# Patient Record
Sex: Male | Born: 1955 | Race: White | Hispanic: No | Marital: Single | Smoking: Former smoker
Health system: Southern US, Community
[De-identification: ages and names within clinical notes are randomized; demographics above are authoritative.]

## PROBLEM LIST (undated history)

## (undated) DIAGNOSIS — J45909 Unspecified asthma, uncomplicated: Secondary | ICD-10-CM

## (undated) DIAGNOSIS — I4891 Unspecified atrial fibrillation: Secondary | ICD-10-CM

## (undated) DIAGNOSIS — K219 Gastro-esophageal reflux disease without esophagitis: Secondary | ICD-10-CM

## (undated) DIAGNOSIS — M109 Gout, unspecified: Secondary | ICD-10-CM

## (undated) DIAGNOSIS — I2510 Atherosclerotic heart disease of native coronary artery without angina pectoris: Secondary | ICD-10-CM

## (undated) DIAGNOSIS — Z9289 Personal history of other medical treatment: Secondary | ICD-10-CM

## (undated) DIAGNOSIS — I5032 Chronic diastolic (congestive) heart failure: Secondary | ICD-10-CM

## (undated) DIAGNOSIS — N182 Chronic kidney disease, stage 2 (mild): Secondary | ICD-10-CM

## (undated) DIAGNOSIS — M1990 Unspecified osteoarthritis, unspecified site: Secondary | ICD-10-CM

## (undated) HISTORY — DX: Chronic diastolic (congestive) heart failure: I50.32

## (undated) HISTORY — DX: Unspecified osteoarthritis, unspecified site: M19.90

## (undated) HISTORY — DX: Chronic kidney disease, stage 2 (mild): N18.2

## (undated) HISTORY — DX: Unspecified asthma, uncomplicated: J45.909

## (undated) HISTORY — PX: VASECTOMY: SHX75

## (undated) HISTORY — DX: Personal history of other medical treatment: Z92.89

## (undated) HISTORY — DX: Atherosclerotic heart disease of native coronary artery without angina pectoris: I25.10

## (undated) HISTORY — DX: Unspecified atrial fibrillation: I48.91

## (undated) HISTORY — DX: Gastro-esophageal reflux disease without esophagitis: K21.9

## (undated) HISTORY — DX: Gout, unspecified: M10.9

---

## 2000-08-13 HISTORY — PX: CORONARY ARTERY BYPASS GRAFT: SHX141

## 2009-01-12 ENCOUNTER — Encounter (INDEPENDENT_AMBULATORY_CARE_PROVIDER_SITE_OTHER): Payer: Self-pay | Admitting: *Deleted

## 2009-01-12 LAB — CONVERTED CEMR LAB
ALT: 20 units/L
AST: 25 units/L
Albumin: 4.2 g/dL
Alkaline Phosphatase: 50 units/L
CO2: 23 meq/L
Chloride: 103 meq/L
HDL: 36 mg/dL
LDL Cholesterol: 92 mg/dL
Potassium: 4.9 meq/L
Sodium: 139 meq/L

## 2009-10-24 ENCOUNTER — Encounter (INDEPENDENT_AMBULATORY_CARE_PROVIDER_SITE_OTHER): Payer: Self-pay | Admitting: *Deleted

## 2009-10-24 DIAGNOSIS — Z8679 Personal history of other diseases of the circulatory system: Secondary | ICD-10-CM | POA: Insufficient documentation

## 2009-10-24 DIAGNOSIS — M1990 Unspecified osteoarthritis, unspecified site: Secondary | ICD-10-CM | POA: Insufficient documentation

## 2009-10-24 DIAGNOSIS — E785 Hyperlipidemia, unspecified: Secondary | ICD-10-CM | POA: Insufficient documentation

## 2009-10-26 ENCOUNTER — Ambulatory Visit: Payer: Self-pay | Admitting: Cardiology

## 2009-10-26 DIAGNOSIS — I25810 Atherosclerosis of coronary artery bypass graft(s) without angina pectoris: Secondary | ICD-10-CM | POA: Insufficient documentation

## 2009-10-27 ENCOUNTER — Telehealth (INDEPENDENT_AMBULATORY_CARE_PROVIDER_SITE_OTHER): Payer: Self-pay

## 2009-10-27 ENCOUNTER — Encounter: Payer: Self-pay | Admitting: Cardiology

## 2009-11-02 ENCOUNTER — Telehealth (INDEPENDENT_AMBULATORY_CARE_PROVIDER_SITE_OTHER): Payer: Self-pay

## 2009-11-04 ENCOUNTER — Encounter: Payer: Self-pay | Admitting: Cardiology

## 2010-06-12 ENCOUNTER — Encounter (INDEPENDENT_AMBULATORY_CARE_PROVIDER_SITE_OTHER): Payer: Self-pay

## 2010-06-12 LAB — CONVERTED CEMR LAB
Cholesterol: 133 mg/dL
Triglycerides: 98 mg/dL

## 2010-06-21 ENCOUNTER — Encounter (INDEPENDENT_AMBULATORY_CARE_PROVIDER_SITE_OTHER): Payer: Self-pay

## 2010-08-13 HISTORY — PX: CORONARY ARTERY BYPASS GRAFT: SHX141

## 2010-08-22 ENCOUNTER — Telehealth (INDEPENDENT_AMBULATORY_CARE_PROVIDER_SITE_OTHER): Payer: Self-pay | Admitting: Radiology

## 2010-08-23 ENCOUNTER — Encounter: Payer: Self-pay | Admitting: *Deleted

## 2010-08-23 ENCOUNTER — Ambulatory Visit: Admission: RE | Admit: 2010-08-23 | Discharge: 2010-08-23 | Payer: Self-pay | Source: Home / Self Care

## 2010-08-23 ENCOUNTER — Encounter: Payer: Self-pay | Admitting: Cardiology

## 2010-08-23 ENCOUNTER — Encounter (HOSPITAL_COMMUNITY)
Admission: RE | Admit: 2010-08-23 | Discharge: 2010-09-12 | Payer: Self-pay | Source: Home / Self Care | Attending: Cardiology | Admitting: Cardiology

## 2010-08-23 DIAGNOSIS — R072 Precordial pain: Secondary | ICD-10-CM | POA: Insufficient documentation

## 2010-08-24 ENCOUNTER — Encounter: Payer: Self-pay | Admitting: Cardiology

## 2010-08-24 ENCOUNTER — Ambulatory Visit (HOSPITAL_COMMUNITY)
Admission: RE | Admit: 2010-08-24 | Discharge: 2010-08-25 | Payer: Self-pay | Source: Home / Self Care | Attending: Cardiology | Admitting: Cardiology

## 2010-08-25 ENCOUNTER — Encounter: Payer: Self-pay | Admitting: Thoracic Surgery (Cardiothoracic Vascular Surgery)

## 2010-08-25 ENCOUNTER — Encounter: Payer: Self-pay | Admitting: Cardiology

## 2010-08-28 ENCOUNTER — Encounter: Payer: Self-pay | Admitting: Cardiology

## 2010-08-28 LAB — CONVERTED CEMR LAB
BUN: 17 mg/dL (ref 6–23)
Basophils Absolute: 0 10*3/uL (ref 0.0–0.1)
Calcium: 9.7 mg/dL (ref 8.4–10.5)
Eosinophils Absolute: 0.1 10*3/uL (ref 0.0–0.7)
INR: 0.99 (ref <1.50)
Lymphocytes Relative: 27 % (ref 12–46)
Lymphs Abs: 1.8 10*3/uL (ref 0.7–4.0)
Neutrophils Relative %: 62 % (ref 43–77)
Platelets: 296 10*3/uL (ref 150–400)
Potassium: 4.3 meq/L (ref 3.5–5.3)
Prothrombin Time: 13.3 s (ref 11.6–15.2)
WBC: 6.5 10*3/uL (ref 4.0–10.5)

## 2010-08-28 LAB — BASIC METABOLIC PANEL
BUN: 18 mg/dL (ref 6–23)
CO2: 26 mEq/L (ref 19–32)
Calcium: 8.8 mg/dL (ref 8.4–10.5)
Chloride: 108 mEq/L (ref 96–112)
Creatinine, Ser: 1.21 mg/dL (ref 0.4–1.5)
GFR calc Af Amer: >60 mL/min (ref >60)
GFR calc non Af Amer: >60 mL/min (ref >60)
Glucose, Bld: 110 mg/dL — ABNORMAL HIGH (ref 70–99)
Potassium: 3.9 mEq/L (ref 3.5–5.1)
Sodium: 141 mEq/L (ref 135–145)

## 2010-08-28 LAB — CBC
HCT: 37.4 % — ABNORMAL LOW (ref 39.0–52.0)
Hemoglobin: 13.3 g/dL (ref 13.0–17.0)
MCH: 31.6 pg (ref 26.0–34.0)
MCHC: 35.6 g/dL (ref 30.0–36.0)
MCV: 88.8 fL (ref 78.0–100.0)
Platelets: 217 10*3/uL (ref 150–400)
RBC: 4.21 MIL/uL — ABNORMAL LOW (ref 4.22–5.81)
RDW: 12.2 % (ref 11.5–15.5)
WBC: 7 10*3/uL (ref 4.0–10.5)

## 2010-08-28 LAB — CARDIAC PANEL(CRET KIN+CKTOT+MB+TROPI)
CK, MB: 1.6 ng/mL (ref 0.3–4.0)
Relative Index: INVALID (ref 0.0–2.5)
Total CK: 69 U/L (ref 7–232)
Troponin I: 0.01 ng/mL (ref 0.00–0.06)

## 2010-08-30 ENCOUNTER — Inpatient Hospital Stay (HOSPITAL_COMMUNITY)
Admission: RE | Admit: 2010-08-30 | Discharge: 2010-09-04 | Payer: Self-pay | Source: Home / Self Care | Attending: Thoracic Surgery (Cardiothoracic Vascular Surgery) | Admitting: Thoracic Surgery (Cardiothoracic Vascular Surgery)

## 2010-08-30 LAB — CBC
HCT: 38.8 % — ABNORMAL LOW (ref 39.0–52.0)
Hemoglobin: 14.3 g/dL (ref 13.0–17.0)
MCH: 31.4 pg (ref 26.0–34.0)
MCHC: 36.9 g/dL — ABNORMAL HIGH (ref 30.0–36.0)
MCV: 85.1 fL (ref 78.0–100.0)
Platelets: 264 10*3/uL (ref 150–400)
RBC: 4.56 MIL/uL (ref 4.22–5.81)
RDW: 12.1 % (ref 11.5–15.5)
WBC: 5.6 10*3/uL (ref 4.0–10.5)

## 2010-08-30 LAB — HEMOGLOBIN A1C
Hgb A1c MFr Bld: 5.5 % (ref <5.7)
Mean Plasma Glucose: 111 mg/dL (ref <117)

## 2010-08-30 LAB — URINALYSIS, ROUTINE W REFLEX MICROSCOPIC
Bilirubin Urine: NEGATIVE
Hgb urine dipstick: NEGATIVE
Ketones, ur: NEGATIVE mg/dL
Nitrite: NEGATIVE
Protein, ur: NEGATIVE mg/dL
Specific Gravity, Urine: 1.011 (ref 1.005–1.030)
Urine Glucose, Fasting: NEGATIVE mg/dL
Urobilinogen, UA: 0.2 mg/dL (ref 0.0–1.0)
pH: 7 (ref 5.0–8.0)

## 2010-08-30 LAB — COMPREHENSIVE METABOLIC PANEL
ALT: 19 U/L (ref 0–53)
AST: 19 U/L (ref 0–37)
Albumin: 4.5 g/dL (ref 3.5–5.2)
Alkaline Phosphatase: 44 U/L (ref 39–117)
BUN: 17 mg/dL (ref 6–23)
CO2: 22 mEq/L (ref 19–32)
Calcium: 9.7 mg/dL (ref 8.4–10.5)
Chloride: 106 mEq/L (ref 96–112)
Creatinine, Ser: 1.16 mg/dL (ref 0.4–1.5)
GFR calc Af Amer: >60 mL/min (ref >60)
GFR calc non Af Amer: >60 mL/min (ref >60)
Glucose, Bld: 91 mg/dL (ref 70–99)
Potassium: 4.4 mEq/L (ref 3.5–5.1)
Sodium: 137 mEq/L (ref 135–145)
Total Bilirubin: 0.7 mg/dL (ref 0.3–1.2)
Total Protein: 6.6 g/dL (ref 6.0–8.3)

## 2010-08-30 LAB — ABO/RH: ABO/RH(D): AB POS

## 2010-08-30 LAB — BLOOD GAS, ARTERIAL
Acid-Base Excess: 0.8 mmol/L (ref 0.0–2.0)
Bicarbonate: 24.2 mEq/L — ABNORMAL HIGH (ref 20.0–24.0)
Drawn by: 181601
FIO2: 0.21 %
O2 Saturation: 97.8 %
Patient temperature: 98.6
TCO2: 25.3 mmol/L (ref 0–100)
pCO2 arterial: 34.5 mmHg — ABNORMAL LOW (ref 35.0–45.0)
pH, Arterial: 7.46 — ABNORMAL HIGH (ref 7.350–7.450)
pO2, Arterial: 101 mmHg — ABNORMAL HIGH (ref 80.0–100.0)

## 2010-08-30 LAB — APTT: aPTT: 31 seconds (ref 24–37)

## 2010-08-30 LAB — SURGICAL PCR SCREEN
MRSA, PCR: NEGATIVE
Staphylococcus aureus: POSITIVE — AB

## 2010-08-30 LAB — PROTIME-INR
INR: 1.04 (ref 0.00–1.49)
Prothrombin Time: 13.8 seconds (ref 11.6–15.2)

## 2010-09-01 NOTE — Discharge Summary (Addendum)
[name], [name]:  [id number]  MEDICAL RECORD NO.:  [id number]           PATIENT TYPE:  LOCATION:                                 FACILITY:  PHYSICIAN:  Jeremy Hendricks C. Sundee Garland, MD, FACCDATE OF BIRTH:  08-17-1955  DATE OF ADMISSION:  08/24/2010 DATE OF DISCHARGE:                              DISCHARGE SUMMARY   DATE OF ADMISSION:  August 24, 2010.  DATE OF DISCHARGE:  August 25, 2010.  PRIMARY CARDIOLOGIST:  Jeremy Sans. Emberlyn Burlison, MD, [facility].  PRIMARY CARE PHYSICIAN:  Jeremy Hendricks. Jeremy Sills, MD.  CARDIOTHORACIC SURGEON:  Jeremy Hendricks. [name], M.D.  DISCHARGE DIAGNOSES: 1. Coronary artery disease.     a.     Cardiac catheterization on August 24, 2010:  Left main has      diffuse 25% stenosis.  LAD proximal 90% stenosis; mid long, 60%      stenosis, distal vessel filled via LIMA and was free of high-grade      disease.  Ramus intermediate was large and long and previously      bypassed.  There was a long 60% stenosis, somewhat narrow-caliber      vessel.  Circumflex in the AV groove had 50% stenosis at the OM-1,      long mid 80% to 90% stenosis, subtotal stenosis before the PDA and      posterolateral, dominant vessel.  PDA was large and free of high-      grade disease.  Posterior lateral was moderate size and 30%      stenosis.  The first obtuse marginal was moderate sized with      ostial 80% stenosis.  The second obtuse marginal was small      branching and normal.  The right coronary was nondominant and      small.  LIMA to LAD, widely patent.  SVG to ramus intermediate was occluded proximally.  SVG to the PDA had severe diffuse disease  (ostial 50% stenosis, proximal 70% stenosis, distal 90%).  Aortic root was obtained to visualize any further grafts as we had no description of the surgery which demonstrated grafts as described, otherwise, free of significant disease.  LVEF 60% with mild inferior hypokinesis.  Recommended for redo bypass by cathing  cardiologist, Jeremy Hendricks.  Cardiothoracic surgery consult via Dr. Charlett Hendricks with recommendation for redo CABG, scheduled on August 30, 2010.  SECONDARY DIAGNOSES: 1. Coronary artery disease.     a.     CABG x3, 2000 (no records available). 2. Hyperlipidemia. 3. Degenerative joint disease.  ALLERGIES:  NKDA.  PROCEDURES: 1. Cardiac catheterization, August 24, 2010:  Please see discharge     diagnoses section, #1, subsection a. 2. EKG, August 25, 2010:  Sinus bradycardia, 57 bpm, T-wave inversion     in V1, V2; otherwise, nonspecific ST-T wave changes with no     significant Q-waves, left axis deviation, no evidence of     hypertrophy, with a slightly delayed R-wave progression.  PR 174,     QRS 94, and QTc of 434.  No significant change from prior tracing     completed  at [facility] office on August 23, 2010, except     for axis change from normal to left?  HISTORY OF PRESENT ILLNESS:  Mr. Kniss is a 55 year old Caucasian gentleman who has known history of coronary artery disease, having undergone CABG 12 years ago in [place], who was sent from his primary care office to be evaluated at [facility] for complaints consistent with angina.  He subsequently had a stress perfusion study with good exercise tolerance, but significant EKG changes and evidence of inferior ischemia.  He was subsequently scheduled for diagnostic cardiac catheterization on August 24, 2010, and presents for that procedure.  HOSPITAL COURSE:  The patient was admitted and underwent procedures as described above.  He tolerated them well without any significant complications.  Unfortunately, as described in discharge diagnoses, section #1 subsection a, the patient did have significant new disease, specifically, two of the three grafts were either totally occluded or significantly occluded, and it was the determination of the cathing physician, Jeremy Hendricks, the patient  would be best suited for surgery consult with question of redo CABG.  Cardiothoracic surgery was able to see the patient in the early afternoon of August 25, 2010.  Dr. Charlett Hendricks reviewed all the data and determined that the patient would benefit from redo CABG, and all the risks and benefits were discussed with the patient who has decided to proceed with scheduled redo CABG this coming Wednesday, August 30, 2010.  The patient is being discharged home in the interim with instructions to continue his preadmission medications and to not exert himself above the level of daily activities.  At the time of discharge, patient received his old medication list, followup instructions, and all questions and concerns had been addressed prior to his leaving the hospital.  DISCHARGE LABS:  WBC is 7.0, HGB 13.3, HCT 37.4, PLT count is 217. Sodium 141, potassium 3.9, chloride 108, bicarb 26, BUN 18, creatinine 1.21, and glucose 110.  Cardiac enzymes negative times one set.  FOLLOWUP PLANS:  Appointment with Cardiothoracic Surgery, redo CABG, August 30, 2010, early a.m. at [facility] (patient to be contacted with further details).  DISCHARGE MEDICATIONS: 1. Aspirin 325 mg 1/2 tablet p.o. daily. 2. Atenolol 25 mg 1 tablet p.o. daily. 3. Crestor 20 mg p.o. daily. 4. Multivitamin daily. 5. Sublingual nitroglycerin 0.4 mg q.5 minutes up to three doses     p.r.n. for chest discomfort.  DURATION OF DISCHARGE ENCOUNTER INCLUDING PHYSICIAN TIME:  35 minutes.     Jeremy Hendricks, PAC   ______________________________ Jeremy Sans. Daleen Squibb, MD, [facility]    MS/MEDQ  D:  08/25/2010  T:  08/26/2010  Job:  [id number]  cc:   Jeremy Hendricks. [name], M.D. Jeremy Qazi C. Daleen Squibb, MD, [facility] Jeremy Hendricks. Jeremy Sills, MD  Electronically Signed by Jeremy Hendricks PAC on 09/01/2010 01:50:13 PM Electronically Signed by Jeremy Castle MD [facility] on 09/01/2010 04:16:37 PM

## 2010-09-04 LAB — PREPARE PLATELETS: Unit division: 0

## 2010-09-04 LAB — GLUCOSE, CAPILLARY
Glucose-Capillary: 82 mg/dL (ref 70–99)
Glucose-Capillary: 188 mg/dL — ABNORMAL HIGH (ref 70–99)
Glucose-Capillary: 129 mg/dL — ABNORMAL HIGH (ref 70–99)

## 2010-09-04 LAB — CBC
HCT: 35 % — ABNORMAL LOW (ref 39.0–52.0)
HCT: 26.1 % — ABNORMAL LOW (ref 39.0–52.0)
HCT: 23.1 % — ABNORMAL LOW (ref 39.0–52.0)
Hemoglobin: 12.5 g/dL — ABNORMAL LOW (ref 13.0–17.0)
Hemoglobin: 9.3 g/dL — ABNORMAL LOW (ref 13.0–17.0)
MCH: 31 pg (ref 26.0–34.0)
MCH: 30.8 pg (ref 26.0–34.0)
MCH: 31.1 pg (ref 26.0–34.0)
MCH: 31.2 pg (ref 26.0–34.0)
MCHC: 35.7 g/dL (ref 30.0–36.0)
MCHC: 35.6 g/dL (ref 30.0–36.0)
MCHC: 36.1 g/dL — ABNORMAL HIGH (ref 30.0–36.0)
MCHC: 35.2 g/dL (ref 30.0–36.0)
MCV: 86.8 fL (ref 78.0–100.0)
MCV: 86.4 fL (ref 78.0–100.0)
MCV: 86.1 fL (ref 78.0–100.0)
MCV: 88.2 fL (ref 78.0–100.0)
Platelets: 138 10*3/uL — ABNORMAL LOW (ref 150–400)
Platelets: 150 10*3/uL (ref 150–400)
Platelets: 143 10*3/uL — ABNORMAL LOW (ref 150–400)
Platelets: 169 10*3/uL (ref 150–400)
Platelets: 150 10*3/uL (ref 150–400)
RBC: 4.03 MIL/uL — ABNORMAL LOW (ref 4.22–5.81)
RBC: 3.02 MIL/uL — ABNORMAL LOW (ref 4.22–5.81)
RBC: 2.62 MIL/uL — ABNORMAL LOW (ref 4.22–5.81)
RBC: 2.47 MIL/uL — ABNORMAL LOW (ref 4.22–5.81)
RDW: 12.3 % (ref 11.5–15.5)
RDW: 12.5 % (ref 11.5–15.5)
RDW: 12.7 % (ref 11.5–15.5)
RDW: 13 % (ref 11.5–15.5)
WBC: 17.4 10*3/uL — ABNORMAL HIGH (ref 4.0–10.5)
WBC: 11.4 10*3/uL — ABNORMAL HIGH (ref 4.0–10.5)
WBC: 13.4 10*3/uL — ABNORMAL HIGH (ref 4.0–10.5)

## 2010-09-04 LAB — PREPARE FRESH FROZEN PLASMA
Unit division: 0
Unit division: 0
Unit division: 0
Unit division: 0

## 2010-09-04 LAB — POCT I-STAT 3, ART BLOOD GAS (G3+)
Acid-base deficit: 2 mmol/L (ref 0.0–2.0)
Patient temperature: 100.2
Patient temperature: 36.1
TCO2: 20 mmol/L (ref 0–100)
pH, Arterial: 7.348 — ABNORMAL LOW (ref 7.350–7.450)
pH, Arterial: 7.376 (ref 7.350–7.450)

## 2010-09-04 LAB — POCT I-STAT 4, (NA,K, GLUC, HGB,HCT)
Glucose, Bld: 105 mg/dL — ABNORMAL HIGH (ref 70–99)
HCT: 36 % — ABNORMAL LOW (ref 39.0–52.0)
Hemoglobin: 12.2 g/dL — ABNORMAL LOW (ref 13.0–17.0)
Potassium: 3.9 mEq/L (ref 3.5–5.1)
Sodium: 141 mEq/L (ref 135–145)

## 2010-09-04 LAB — POCT I-STAT, CHEM 8
BUN: 11 mg/dL (ref 6–23)
Calcium, Ion: 1 mmol/L — ABNORMAL LOW (ref 1.12–1.32)
Creatinine, Ser: 0.8 mg/dL (ref 0.4–1.5)
Creatinine, Ser: 1 mg/dL (ref 0.4–1.5)
Glucose, Bld: 113 mg/dL — ABNORMAL HIGH (ref 70–99)
Glucose, Bld: 134 mg/dL — ABNORMAL HIGH (ref 70–99)
HCT: 19 % — ABNORMAL LOW (ref 39.0–52.0)
Hemoglobin: 6.5 g/dL — CL (ref 13.0–17.0)
Hemoglobin: 7.8 g/dL — ABNORMAL LOW (ref 13.0–17.0)
Hemoglobin: 9.9 g/dL — ABNORMAL LOW (ref 13.0–17.0)
Sodium: 141 mEq/L (ref 135–145)
Sodium: 141 mEq/L (ref 135–145)
Sodium: 145 mEq/L (ref 135–145)
TCO2: 19 mmol/L (ref 0–100)
TCO2: 23 mmol/L (ref 0–100)
TCO2: 24 mmol/L (ref 0–100)

## 2010-09-04 LAB — BASIC METABOLIC PANEL
BUN: 14 mg/dL (ref 6–23)
CO2: 24 mEq/L (ref 19–32)
CO2: 27 mEq/L (ref 19–32)
Calcium: 7.8 mg/dL — ABNORMAL LOW (ref 8.4–10.5)
Chloride: 107 mEq/L (ref 96–112)
Chloride: 102 mEq/L (ref 96–112)
Creatinine, Ser: 1.06 mg/dL (ref 0.4–1.5)
GFR calc Af Amer: >60 mL/min (ref >60)
Glucose, Bld: 149 mg/dL — ABNORMAL HIGH (ref 70–99)
Glucose, Bld: 138 mg/dL — ABNORMAL HIGH (ref 70–99)
Potassium: 4.6 mEq/L (ref 3.5–5.1)
Potassium: 3.9 mEq/L (ref 3.5–5.1)
Sodium: 134 mEq/L — ABNORMAL LOW (ref 135–145)

## 2010-09-04 LAB — DIC (DISSEMINATED INTRAVASCULAR COAGULATION)PANEL
D-Dimer, Quant: 0.67 ug/mL-FEU — ABNORMAL HIGH (ref 0.00–0.48)
Fibrinogen: 259 mg/dL (ref 204–475)
INR: 1.2 (ref 0.00–1.49)
Platelets: 150 10*3/uL (ref 150–400)
Prothrombin Time: 15.4 seconds — ABNORMAL HIGH (ref 11.6–15.2)
Smear Review: NONE SEEN
aPTT: 29 seconds (ref 24–37)

## 2010-09-04 LAB — CREATININE, SERUM
GFR calc Af Amer: >60 mL/min (ref >60)
GFR calc non Af Amer: >60 mL/min (ref >60)

## 2010-09-04 LAB — HEMOGLOBIN AND HEMATOCRIT, BLOOD
HCT: 27.7 % — ABNORMAL LOW (ref 39.0–52.0)
Hemoglobin: 9.7 g/dL — ABNORMAL LOW (ref 13.0–17.0)

## 2010-09-04 LAB — MAGNESIUM
Magnesium: 2.7 mg/dL — ABNORMAL HIGH (ref 1.5–2.5)
Magnesium: 2.4 mg/dL (ref 1.5–2.5)

## 2010-09-04 LAB — PLATELET COUNT: Platelets: 132 10*3/uL — ABNORMAL LOW (ref 150–400)

## 2010-09-04 LAB — PROTIME-INR
INR: 2.05 — ABNORMAL HIGH (ref 0.00–1.49)
INR: 1.51 — ABNORMAL HIGH (ref 0.00–1.49)
Prothrombin Time: 23.3 seconds — ABNORMAL HIGH (ref 11.6–15.2)
Prothrombin Time: 18.4 seconds — ABNORMAL HIGH (ref 11.6–15.2)

## 2010-09-04 LAB — APTT
aPTT: 35 seconds (ref 24–37)
aPTT: 32 seconds (ref 24–37)

## 2010-09-05 LAB — POCT I-STAT 4, (NA,K, GLUC, HGB,HCT)
Glucose, Bld: 82 mg/dL (ref 70–99)
Glucose, Bld: 98 mg/dL (ref 70–99)
Glucose, Bld: 141 mg/dL — ABNORMAL HIGH (ref 70–99)
HCT: 37 % — ABNORMAL LOW (ref 39.0–52.0)
HCT: 24 % — ABNORMAL LOW (ref 39.0–52.0)
HCT: 33 % — ABNORMAL LOW (ref 39.0–52.0)
HCT: 20 % — ABNORMAL LOW (ref 39.0–52.0)
HCT: 26 % — ABNORMAL LOW (ref 39.0–52.0)
Hemoglobin: 12.6 g/dL — ABNORMAL LOW (ref 13.0–17.0)
Hemoglobin: 11.2 g/dL — ABNORMAL LOW (ref 13.0–17.0)
Hemoglobin: 8.2 g/dL — ABNORMAL LOW (ref 13.0–17.0)
Hemoglobin: 8.8 g/dL — ABNORMAL LOW (ref 13.0–17.0)
Hemoglobin: 8.8 g/dL — ABNORMAL LOW (ref 13.0–17.0)
Potassium: 4.7 mEq/L (ref 3.5–5.1)
Potassium: 4 mEq/L (ref 3.5–5.1)
Potassium: 5 mEq/L (ref 3.5–5.1)
Sodium: 140 mEq/L (ref 135–145)
Sodium: 132 mEq/L — ABNORMAL LOW (ref 135–145)
Sodium: 138 mEq/L (ref 135–145)
Sodium: 141 mEq/L (ref 135–145)

## 2010-09-05 LAB — POCT I-STAT 3, ART BLOOD GAS (G3+)
pCO2 arterial: 41.9 mmHg (ref 35.0–45.0)
pH, Arterial: 7.373 (ref 7.350–7.450)
pO2, Arterial: 272 mmHg — ABNORMAL HIGH (ref 80.0–100.0)

## 2010-09-05 LAB — TYPE AND SCREEN
ABO/RH(D): AB POS
Antibody Screen: NEGATIVE
Unit division: 0
Unit division: 0
Unit division: 0
Unit division: 0

## 2010-09-05 LAB — BASIC METABOLIC PANEL
CO2: 26 mEq/L (ref 19–32)
CO2: 29 mEq/L (ref 19–32)
Chloride: 99 mEq/L (ref 96–112)
Chloride: 97 mEq/L (ref 96–112)
Creatinine, Ser: 1.46 mg/dL (ref 0.4–1.5)
Creatinine, Ser: 1.31 mg/dL (ref 0.4–1.5)
GFR calc Af Amer: >60 mL/min (ref >60)
GFR calc Af Amer: >60 mL/min (ref >60)
Potassium: 3.6 mEq/L (ref 3.5–5.1)
Sodium: 139 mEq/L (ref 135–145)

## 2010-09-05 LAB — CBC
Hemoglobin: 8.6 g/dL — ABNORMAL LOW (ref 13.0–17.0)
Hemoglobin: 8.9 g/dL — ABNORMAL LOW (ref 13.0–17.0)
MCH: 30.6 pg (ref 26.0–34.0)
MCV: 88.3 fL (ref 78.0–100.0)
Platelets: 179 10*3/uL (ref 150–400)
Platelets: 275 10*3/uL (ref 150–400)
RBC: 2.76 MIL/uL — ABNORMAL LOW (ref 4.22–5.81)
RBC: 2.91 MIL/uL — ABNORMAL LOW (ref 4.22–5.81)
WBC: 13 10*3/uL — ABNORMAL HIGH (ref 4.0–10.5)
WBC: 11.3 10*3/uL — ABNORMAL HIGH (ref 4.0–10.5)

## 2010-09-05 LAB — GLUCOSE, POCT (MANUAL RESULT ENTRY): Operator id: 156951

## 2010-09-06 LAB — POCT I-STAT 3, ART BLOOD GAS (G3+)
Bicarbonate: 26.3 mEq/L — ABNORMAL HIGH (ref 20.0–24.0)
O2 Saturation: 100 %
TCO2: 28 mmol/L (ref 0–100)
TCO2: 24 mmol/L (ref 0–100)
pCO2 arterial: 46.4 mmHg — ABNORMAL HIGH (ref 35.0–45.0)
pCO2 arterial: 36.2 mmHg (ref 35.0–45.0)

## 2010-09-06 LAB — POCT I-STAT 4, (NA,K, GLUC, HGB,HCT)
Glucose, Bld: 165 mg/dL — ABNORMAL HIGH (ref 70–99)
Glucose, Bld: 128 mg/dL — ABNORMAL HIGH (ref 70–99)
HCT: 34 % — ABNORMAL LOW (ref 39.0–52.0)
HCT: 25 % — ABNORMAL LOW (ref 39.0–52.0)
Hemoglobin: 11.6 g/dL — ABNORMAL LOW (ref 13.0–17.0)
Hemoglobin: 9.9 g/dL — ABNORMAL LOW (ref 13.0–17.0)
Hemoglobin: 9.2 g/dL — ABNORMAL LOW (ref 13.0–17.0)
Potassium: 3 mEq/L — ABNORMAL LOW (ref 3.5–5.1)
Potassium: 4.7 mEq/L (ref 3.5–5.1)
Sodium: 140 mEq/L (ref 135–145)
Sodium: 137 mEq/L (ref 135–145)

## 2010-09-07 NOTE — Consult Note (Addendum)
NAMEALGERNON, MUNDIE NO.:  [id number]  MEDICAL RECORD NO.:  [id number]          PATIENT TYPE:  INP  LOCATION:  2006                         FACILITY:  [facility]  PHYSICIAN:  Tarl Cephas. [name], M.D.DATE OF BIRTH:  1956-07-29  DATE OF CONSULTATION:  08/25/2010 DATE OF DISCHARGE:  08/25/2010                                CONSULTATION   REASON FOR CONSULTATION:  Recurrent coronary artery disease with unstable angina.  HISTORY OF PRESENT ILLNESS:  Mr. Ohair is a 55 year old gentleman with a history of coronary artery disease dating back to 12 years ago when age 76.  He presented with chest pressure with exertion.  Workup revealed three-vessel coronary artery disease and three-vessel bypass grafting done in [place], [place].  At that time, left internal mammary artery was done to the LAD, a saphenous vein graft to the ramus intermedius, and saphenous vein graft to the left dominant posterior descending.  He did well since that time.  He had regular checkups and not had any additional cardiac problems.  On Sunday morning, he had an episode of severe chest pressure lasted about 12-15 minutes.  He took a couple of nitroglycerin, which were out of date.  Pain subsequently resolved on its own.  EMS was called by his girlfriend, however, at the time they arrived his pain resolved, and he did not go to the hospital. He did see Dr. Ouida Sills, Dr. Juanito Doom.  A stress Myoview study was ordered, and it showed evidence of significant inferolateral ischemia. Yesterday, he underwent cardiac catheterization at which time, he was found to have severe native three vessel coronary artery disease.  His left internal mammary artery to his LAD was patent and free of disease. Saphenous vein graft to the ramus intermedius was totally occluded and appeared chronic.  Saphenous vein graft to the left posterior descending was heavily and diffusely diseased.  It was not a vessel suitable  for percutaneous intervention.  The patient is currently painfree.  He was noted to have good left ventricular function on both the Myoview and catheterization.  PAST MEDICAL HISTORY:  Significant for: 1. Coronary artery disease, see HPI for details. 2. Dyslipidemia. 3. Some osteoarthritis.  MEDICATIONS PRIOR TO ADMISSION: 1. Aspirin 325 mg daily. 2. Atenolol 25 mg daily. 3. Multivitamin one daily. 4. Crestor 20 mg daily.  He has no known drug allergies.  FAMILY HISTORY:  Strongly positive for premature coronary artery disease in both mother and father.  SOCIAL HISTORY:  He did smoke up until 2001.  He does not drink alcohol. He does exercise regularly.  He works as an Tax inspector.  He is divorced.  Lives by himself.  REVIEW OF SYSTEMS:  The patient really was feeling relatively well, possibly noted he was getting tired a little bit easier than previously prior to this episode, but this was a single episode of chest discomfort.  All other systems are negative.  PHYSICAL EXAMINATION:  GENERAL:  Mr. Livolsi is a well-appearing 55 year old gentleman in no acute distress.  He is well-developed and well- nourished. VITAL SIGNS:  He is afebrile.  His blood pressure is  135/81, pulse is 60 and regular, respirations are 16, oxygen saturation is 97% on room air. NEUROLOGIC:  He is alert and oriented x3 with no focal deficits. HEENT:  Unremarkable. NECK:  Supple without thyromegaly, adenopathy, or bruits. HEART:  Regular rate and rhythm.  Normal S1 and S2.  No murmurs, rubs, or gallops.  His sternal incision is well healed.  Sternum is stable. LUNGS:  Clear with equal breath sounds bilaterally. EXTREMITIES:  With a Allen's test on the left wrist, there was complete refill of the thumb and index finger with radial compression.  He had 2+ pulses throughout.  There was no peripheral edema.  LABORATORY DATA:  White count 6.5, hematocrit 43, platelets 296.  PT 13.3, PTT 30.   Sodium 138, potassium 4.3, BUN 17, creatinine 1.12.  EKG shows T-wave abnormality consistent with anterolateral ischemia.  IMPRESSION:  Mr. Curro is a 55 year old gentleman 12 years out from coronary artery bypass grafting.  He presents with recurrent unstable chest pain.  So, he had one episode, but it did occur with essentially minimal activity and lasted 12-15 minutes.  At catheterization, he has a patent mammary to left anterior descending, but has two occluded vein grafts.  In reviewing the previous operative note, there was mention made of consideration for using bilateral mammaries, but it does not appear to be done, but there is no indication of why that was the case. Of note, the posterior descending which is likely the culprit vessel in this case given the heavily diseased graft was noted to be a poor vessel although, it does appear to be graftable as viewed on angiography during this admission.  I discussed the options with the patient.  I do not think the vein graft is appropriate for any type of angioplasty procedure, but I do think he would be a candidate for redo bypass grafting, although is primarily for relief of symptoms and prevention of MI rather than actual survival benefit given the survival is very good with a patent mammary to his left anterior descending in place.  I discussed graft options including saphenous vein, but would like to use right mammary and left radial if possible for hopefully longer term patency.  I discussed in detail with him the indications, risks, benefits, and alternatives.  He understands the nature of the procedure, need for general anesthesia, expected hospital stay, and overall recovery.  He understands the risks including but not limited to death, stroke, myocardial infarction, deep vein thrombosis, pulmonary embolism, bleeding, possible need for transfusions, infection as well as other organ system dysfunction including  respiratory, renal, or gastrointestinal complications.  He understands and accepts these risks.  He does agree to proceed.  He very strongly wants to go home and come back in the middle of next week for surgery.  I ought to discuss that with Cardiology and see whether they feel that is a good idea.     Salvatore Decent [name], M.D.     SCH/MEDQ  D:  08/25/2010  T:  08/26/2010  Job:  [id number]  cc:   Kingsley Callander. Ouida Sills, MD Jesse Sans Daleen Squibb, MD, [facility] Rollene Rotunda, MD, [facility]  Electronically Signed by Charlett Lango M.D. on 09/07/2010 11:29:37 AM

## 2010-09-07 NOTE — Op Note (Addendum)
NAMEIBRAHAM, Jeremy Hendricks NO.:  [id number]  MEDICAL RECORD NO.:  [id number]          PATIENT TYPE:  INP  LOCATION:  2038                         FACILITY:  [facility]  PHYSICIAN:  Marquis Down. [name], M.D.DATE OF BIRTH:  12/22/1955  DATE OF PROCEDURE:  08/30/2010 DATE OF DISCHARGE:                              OPERATIVE REPORT   PREOPERATIVE DIAGNOSIS:  Severe two-vessel coronary artery disease with previous bypass surgery with new-onset angina.  POSTOPERATIVE DIAGNOSIS:  Severe two-vessel coronary artery disease with previous bypass surgery with new-onset angina.  PROCEDURES:  Redo median sternotomy, extracorporeal circulation, redo coronary artery bypass grafting x2 (left radial artery to posterior descending, free right internal mammary artery to ramus intermedius)  SURGEON:  Viviann Spare C. [name], MD  ASSISTANT:  Gershon Crane.  ANESTHESIA:  General.  FINDINGS:  Severe intrapericardial adhesions, cardiomegaly, poor quality target vessels.  CLINICAL NOTE:  Jeremy Hendricks is a 55 year old gentleman who previously had coronary artery bypass grafting 12 years ago in [place].  He now presents with recurrent new onset chest pain.  He had a positive exercise study and cardiac catheterization was performed which revealed patent mammary to LAD, saphenous vein graft to the ramus intermedius was totally occluded, a saphenous vein graft to a left-sided posterior descending was heavily diseased.  The patient was offered redo coronary artery bypass grafting.  Indications, risks, benefits, and alternatives were discussed in detail with the patient as well as desired to pursue arterial grafting. He did understand that the results were less predictable and the risks higher with redo grafting than first time surgery.  The patient was evaluated for possible radial artery use. He had a normal Allen test on the left side on physical exam as well as Doppler measurement of the palmar  arch.  OPERATIVE NOTE:  Mr. Cranshaw was brought to the preop holding area on August 30, 2010.  There, the Anesthesia Service placed a Swan-Ganz catheter, established intravenous access, and placed arterial blood pressure monitoring line.  He was taken to the operating room, anesthetized and intubated.  Intravenous antibiotics were administered. Foley catheter was placed.  The chest, abdomen, and legs were prepped and draped in the usual sterile fashion.  The left arm likewise was prepped and draped sterilely.  An incision was made over the volar aspect of the left wrist and was carried through the skin and subcutaneous tissue.  The short segment of the radial artery distally was dissected free with proximal occlusion.  There was a good Doppler signal in the radial distally, palmar arch as well as the ulnar.  There also was a good palpable pulse distally with proximal occlusion.  The incision then was extended to just below the antecubital fossa and radial artery was harvested using the harmonic scalpel.  After harvesting the conduit, the wound was irrigated and closed in 2 layers with a running 3-0 Vicryl subcutaneous suture and running 3-0 Vicryl subcuticular suture.  The arm then was wrapped and tucked to the patient's side.  As the radial artery was closed, a redo median sternotomy was performed.  The wire was removed.  The sternum was divided with  an oscillating saw.  The mediastinal tissue underlying the sternum was dissected off enough to allow placement of the Rultract retractor.  The right internal mammary artery then was harvested using standard technique.  The patient was heparinized as dissection was being completed after ensuring all side branches had been clipped and there was good hemostasis.  The artery was divided proximally.  The proximal stump was oversewn with a 2-0 silk suture ligature.  Both the radial artery and right mammary were placed into heparinized saline  solution. Sternal retractor then was placed and dissection was begun.  There were dense intrapericardial adhesions.  The pericardium could not be closed previously.  These adhesions were particularly dense of the right ventricle to the left hemi sternum and left anterior chest wall.  This area was not dissected until after the patient was on bypass.  The diaphragmatic space was developed and that was relatively free of the adhesions in comparison to the remainder of the heart.  Dissection was carried up along the right atrium which was densely adhesed.  Great care was taken.  This was meticulous and time consuming dissection.  The dissection was carried up to the ascending aorta.  After confirming adequate anticoagulation with ACT measurement, the aorta was cannulated via concentric 2-0 Ethilon pledgeted purse-string sutures adjacent to the previous cannulation site.  A dual-stage venous cannula placed via purse-string suture in the right atrial appendage.  Cardiopulmonary bypass was instituted and the patient was cooled to 32 degrees Celsius. The remainder of the right atrium and aorta then were dissected out. Care was taken to use a no-touch technique on the vein grafts.  With the heart decompressed, the dissection of the right ventricle off the anterior chest wall on the left side was performed.  This was a very meticulous dissection which likewise was very time consuming. Ultimately, a pericardial edge was noted that assisted in the dissection.  The mammary pedicle on the left side was dissected out where it entered the pericardium and encircled for later clamping during the crossclamp portion of the procedure.  After prolonged period of time, the apex of the heart and portion lateral wall were dissected out. The basilar portion of the posterolateral wall was not dissected until after the crossclamp was placed and the vein grafts were running through that area.  A retrograde  cardioplegic cannula was placed in the right atrium and directed into the coronary sinus.  A left ventricular vent was placed via purse-string suture in the right superior pulmonary vein. Antegrade cardioplegic cannula was placed in the ascending aorta.  The aorta was crossclamped.  The left ventricle was emptied via the aortic root vent.  Cardiac arrest was achieved with cold retrograde blood cardioplegia.  A total of 1 liter of cardioplegia was administered.  There was myocardial septal cooling to 8 degrees Celsius. There was a good diastolic arrest.  Topical iced saline was also used to help cool the right ventricle.  Additional dissection then was carried. The left-sided posterior descending branch was identified.  The radial conduit was slightly longer than the right mammary conduit, therefore it was used for this graft.  This vessel was a 1.5-mm vessel, but was diffusely disease, was poor quality and only a 1-mm probe was passed distally.  There was a secondary branch adjacent to this which was even smaller and not suitable for grafting.  The distal end of the radial artery was beveled and was anastomosed end-to-side with running 8-0 Prolene suture.  Cardioplegia was drawn  up in the syringe and the graft was manually flushed to assess flow and hemostasis.  Next, the ramus intermedius was dissected out.  This was slightly larger, was partially intramyocardial, did accept a 1.5-mm probe, but likewise was not a good-quality vessel.  The right mammary was a good- quality vessel.  The distal end was beveled and was anastomosed end-to- side to ramus intermedius with a running 8-0 Prolene suture.  Probe was passed easily proximally and distally and administration of cardioplegia revealed good hemostasis and acceptable flow through the graft.  Additional cardioplegia was administered via the retrograde cannula at 20-minute intervals.  It was noted that the radial artery graft would not  be sufficient length to reach the aorta given the patient's marked cardiomegaly, therefore additional vein was harvested for the proximal portion of the graft to the posterior descending.  An incision was made in the left ankle and this vein was not suitable.  A second incision was made at the right ankle and the vein there was of acceptable quality. While that vein was being harvested, the proximal anastomosis for the right mammary artery was performed to a 4.0-mm punch aortotomy.  The vein then was sutured to the second aortotomy with a running 6-0 Prolene suture.  After completing that anastomosis, the patient was placed in Trendelenburg position and warm dose of retrograde cardioplegia was administered.  Lidocaine was administered.  The bulldog clamp was removed from the left mammary artery.  The heart and aortic root were deaired through the vein graft and the aortic crossclamp was removed. The total crossclamp time was 82 minutes.  The bulldog clamp was placed on the vein graft and the radial artery to vein graft anastomosis was performed with running 7-0 Prolene suture. This anastomosis it was deaired prior to restoring flow to the posterior descending.  While rewarming was completed, epicardial pacing wires were placed on the right ventricle and right atrium and the patient had rewarmed to 37 degrees Celsius.  Dopamine infusion was started at 5 mcg/kg/min.  The patient was weaned from cardiopulmonary bypass on the first attempt.  The total bypass time was 208 minutes.  The patient did require transfusion for anemia and subsequent fresh frozen plasma for coagulopathy.  The initial cardiac index was greater than 2 liters per minute per meter squared.  The patient remained hemodynamically stable throughout postbypass.  A test dose of protamine was administered and was well tolerated.  The atrial and aortic cannulae were removed.  The left ventricular vent and retrograde  cardioplegic cannula were then removed prior to coming off bypass.  The remainder of protamine was administered without incident.  Chest was irrigated with warm saline.  Bilateral pleural tubes and single mediastinal chest tube were placed through the separate subcostal incisions.  A sheet of Gore-Tex was placed in the anterior chest intact to the pericardium to protect the innominate artery and heart from adhesions to the anterior chest wall in the event of need for additional cardiac procedure in the future.  The sternum was closed with interrupted single and double heavy-gauge stainless steel wires.  The pectoralis fascia, subcutaneous tissue, and skin were closed in a standard fashion.  The sponge and instrument counts were correct, but there were 2 missing CC needles from a 6-0 Prolene suture.  An x-ray was performed in the operating room which showed no evidence of retained foreign body.  The patient remained hemodynamically stable.  He was transported from the operating room to the Surgical Intensive Care  Unit in good condition.  The patient is not a candidate for additional redo bypass grafting.     Salvatore Decent [name], M.D.     SCH/MEDQ  D:  09/01/2010  T:  09/02/2010  Job:  [id number]  cc:   Thomas C. Daleen Squibb, MD, [facility] Kingsley Callander. Ouida Sills, MD  Electronically Signed by Charlett Lango M.D. on 09/07/2010 11:29:47 AM

## 2010-09-07 NOTE — Discharge Summary (Addendum)
[name], Hendricks NO.:  [id number]  MEDICAL RECORD NO.:  [id number]           PATIENT TYPE:  LOCATION:                                 FACILITY:  PHYSICIAN:  Jeremy Hendricks. [name], M.D.DATE OF BIRTH:  1956/02/18  DATE OF ADMISSION: DATE OF DISCHARGE:                              DISCHARGE SUMMARY   FINAL DIAGNOSIS:  Severe two-vessel coronary artery disease with previous bypass surgery with new-onset angina.  IN-HOSPITAL DIAGNOSES: 1. Acute blood loss anemia postoperatively. 2. Volume overload postoperatively. 3. Left pleural effusion post-operatively. 4. Postoperative atrial fibrillation.  SECONDARY DIAGNOSES: 1. History of coronary artery disease status post three-vessel bypass     grafting done in [place], [place].  At that time, left     internal mammary artery to left anterior descending, saphenous vein     graft to ramus intermedius, saphenous vein graft to left dominant     posterior descending artery. 2. Dyslipidemia. 3. History of osteoarthritis.  THE PATIENT'S IN-HOSPITAL OPERATIONS AND PROCEDURES: 1. Redo sternotomy for redo coronary artery bypass grafting x2 using     left radial artery to posterior descending, free right internal     mammary artery to ramus intermedius.  This was done by Dr.     [name] on August 30, 2010. 2. Left thoracentesis, done by Dr. [name] on September 02, 2010,     with removal of 1100 mL of bloody fluid.  HISTORY AND PHYSICAL AND HOSPITAL COURSE:  The patient is a 55 year old gentleman who previously had coronary artery bypass grafting 12 years ago, done in [place].  He now presents with recurrent new-onset chest pain.  The patient had a positive exercise study, and cardiac catheterization was performed on August 24, 2010.  This revealed patent mammary to LAD, saphenous vein graft to ramus intermedius was totally occluded, saphenous vein graft to left-sided posterior descending was heavily  diseased.  Following cardiac catheterization, Dr. [name] was consulted.  Dr. [name] saw and evaluated the patient.  He discussed with the patient undergoing redo coronary artery bypass grafting.  He discussed with the patient risks and benefits.  The patient knowledged understanding and agreed to proceed.  Surgery was scheduled for August 30, 2010.  For further details of the patient's past medical history and physical exam, please see dictated H and P.  The patient was taken to the operating room on August 30, 2010, where he underwent redo sternotomy for redo coronary artery bypass grafting x2 using left radial artery to posterior descending, free right internal mammary artery to ramus intermedius.  The patient tolerated this procedure well and was transferred to the intensive care unit in stable condition.  Postoperatively, the patient was noted to be hemodynamically stable.  He was extubated on the evening of surgery.  Post extubation, the patient was noted to be alert and oriented x4 and neuro intact. Postoperatively, the patient was noted to be in normal sinus rhythm. Blood pressure was stable.  All drips were able to be weaned and discontinued.  The patient was restarted on his atenolol.  On postop day 2, the patient went into rapid atrial  fibrillation.  He was started on IV amiodarone.  He did convert to sinus rhythm on the IV amiodarone. The patient was then started on p.o. amiodarone and IV was discontinued. Currently, the patient has remained in normal sinus rhythm since conversion.  During this time, blood pressure has been monitored and has remained stable.  The patient has not been started on an ACE inhibitor secondary to blood pressure well controlled on atenolol.  This can be reevaluated as an outpatient if the blood pressure increases.  Post extubation, chest x-ray was obtained and was stable.  The patient was encouraged to use his incentive spirometer.  The  patient had minimal drainage from chest tubes, and chest tubes were discontinued in routine fashion.  A followup chest x-ray on September 02, 2010, showed a day with significant left pleural effusion.  Dr. [name] performed a left thoracentesis on September 02, 2010, removing 1100 mL of bloody fluid. The patient's pulmonary status did improve following thoracentesis. Most recent followup chest x-ray done today on September 04, 2010, showed persistent left pleural effusion with a small right pleural effusion. No pneumothorax noted.  The patient has been using his incentive spirometer and has been able to be weaned off oxygen with O2 saturations maintaining greater than 90% on room air.  We will continue to follow.  During the patient's postoperative course, he did develop acute blood loss anemia.  The patient required transfusion.  His hemoglobin and hematocrit were followed closely and stabilized.  Most recent hemoglobin and hematocrit today, September 04, 2010, is 8.9 and 25.7.  The patient also was noted to have volume overload postoperatively.  He was started on diuretics.  Daily weights were followed closely.  The patient currently remains 4.4 kg above his preoperative weight.  We plan to discharge the patient home on diuretics for several days. Postoperatively, the patient was up, ambulating well without difficulty. He was tolerating diet well.  No nausea or vomiting noted.  All incisions were clean, dry, and intact and healing well.  Neuro noted to be intact in the patient's left upper extremity following his radial artery harvest.  On postop day 5, September 04, 2010, the patient was noted to be afebrile, in normal sinus rhythm, blood pressure stable.  O2 sats greater than 90% on room air.  Lab work shows white blood cell count of 11.3, hemoglobin of 8.9, hematocrit of 25.7, platelet count 275.  Sodium of 138, potassium 3.6, chloride of 97, bicarbonate 29, BUN 21, creatinine  1.0, glucose 104.  The patient is tentatively ready for discharge to home later today versus a.m. after evaluated by MD.  Elenor Quinones APPOINTMENTS:  A followup appointment has been arranged with Dr. [name] for September 28, 2010, at 12:45 p.m.  The patient will need to obtain PA and lateral chest x-ray 30 minutes prior to this appointment.  The patient will need to follow up with Dr. Daleen Squibb in 2 weeks.  He will need to contact his office to make these arrangements.  ACTIVITY:  The patient is instructed no driving until released to do so, no lifting over 10 pounds.  He is told to ambulate 3-4 times per day, progress as tolerated, and continue his breathing exercises.  INCISIONAL CARE:  The patient is told to shower washing his incisions using soap and water.  He is to contact the office if he develops any drainage or opening from any of his incision sites.  DIET:  The patient is educated on diet to be low fat,  low salt.  DISCHARGE MEDICATIONS: 1. Amiodarone 400 mg two times per day x14 days and 200 mg two times     per day. 2. Lasix 40 mg two times per day x3 days, then 40 mg daily x7 days. 3. Imdur 30 mg daily. 4. Oxycodone 5 mg one to two tablets q.3 h. p.r.n. pain. 5. Potassium chloride 40 mEq daily x3 days, then 20 mEq daily x7 days. 6. Aspirin 325 mg daily. 7. Atenolol 25 mg daily. 8. Crestor 20 mg daily. 9. Multivitamin daily. 10.Nitroglycerin SL 0.4 mg every 5 minutes as needed up to three doses     p.r.n.     Sol Blazing, PA   ______________________________ Salvatore Decent [name], M.D.    KMD/MEDQ  D:  09/04/2010  T:  09/05/2010  Job:  [id number]  cc:   Thomas C. Daleen Squibb, MD, [facility]  Electronically Signed by Cameron Proud PA on 09/06/2010 03:59:13 PM Electronically Signed by Charlett Lango M.D. on 09/07/2010 11:29:54 AM

## 2010-09-07 NOTE — Procedures (Addendum)
NAMEJOEDY, EICKHOFF NO.:  [id number]  MEDICAL RECORD NO.:  [id number]          PATIENT TYPE:  OIB  LOCATION:  2006                         FACILITY:  [facility]  PHYSICIAN:  Rollene Rotunda, MD, FACCDATE OF BIRTH:  Nov 30, 1955  DATE OF PROCEDURE:  08/24/2010 DATE OF DISCHARGE:                           CARDIAC CATHETERIZATION   PRIMARY:  Kingsley Callander. Ouida Sills, MD  CARDIOLOGIST:  Jesse Sans. Wall, MD, [facility]  PROCEDURE:  Left heart catheterization/coronary arteriography.  SURGEON:  Rollene Rotunda, MD, [facility]  INDICATIONS:  Evaluate the patient with an abnormal stress perfusion study demonstrating inferior ischemia.  He had unstable chest pain.  He had bypass grafting 12 years ago in [place].  PROCEDURE NOTE:  Left heart catheterization was performed via the right femoral artery.  The artery was cannulated using the wall puncture.  A #5-French arterial sheath was inserted via the Seldinger technique. Preformed Judkins, pigtail, and Amplatz left catheters were used.  The patient tolerated the procedure well and left the lab in stable condition.  RESULTS:  Hemodynamics:  LV 136/18, AO 127/77.  Coronaries:  Left main had diffuse 25% stenosis.  The LAD had proximal 90% stenosis.  There was mid long diffuse 60% stenosis.  The distal vessel filled via the LIMA and was free of high-grade disease.  Ramus intermediate was large and long and previously bypassed.  There was a long 60% stenosis.  It was a somewhat narrow-caliber vessel.  Circumflex in the AV groove had 50% stenosis at the OM-1.  There was a long mid 80- 90% stenosis.  There was subtotal stenosis before the PDA and posterolateral.  This was a dominant vessel.  PDA was large and free of high-grade disease.  Posterolateral was moderate sized with 30% stenosis.  There was a first obtuse marginal which was moderate sized with ostial 80% stenosis.  The second obtuse marginal was small branching and normal.  Right coronary  artery:  The right coronary artery was nondominant and small.  Grafts:  LIMA to the LAD was widely patent.  Saphenous vein graft to ramus intermediate was occluded proximally.  Saphenous vein graft to the PDA had severe diffuse disease.  There was an ostial 50% stenosis. There was proximal 70% stenosis.  There was distal 90% stenosis.  An aortic root was obtained to visualize any further grafts as we had no description of the surgery.  It demonstrated the grafts as described. It was otherwise free of disease.  Left ventriculogram:  Left ventriculogram was obtained in the RAO projection with 60% EF and mild inferior hypokinesis.  CONCLUSION:  Severe native three-vessel coronary artery disease. Preserved ejection fraction.  Occluded vein graft as described.  Patent LIMA.  The vein graft to the PDA was severely diffusely diseased.  Given the nature of this, this might best be repaired with a redo bypass surgery. Particularly since the patient has other circ vessels that could be bypassed.  I reviewed this with the patient and colleagues.  I will consult Cardiothoracic Surgery for consideration of redo CABG.     Rollene Rotunda, MD, [facility]     JH/MEDQ  D:  08/24/2010  T:  08/24/2010  Job:  [id number]  cc:   Kingsley Callander. Ouida Sills, MD Jesse Sans Daleen Squibb, MD, [facility]  Electronically Signed by Rollene Rotunda MD [facility] on 09/07/2010 12:28:34 PM

## 2010-09-12 ENCOUNTER — Telehealth: Payer: Self-pay | Admitting: Cardiology

## 2010-09-12 NOTE — Letter (Signed)
Summary: PROGRESS NOTE DR Ouida Sills  PROGRESS NOTE DR Ouida Sills   Imported By: Faythe Ghee 11/04/2009 16:26:33  _____________________________________________________________________  External Attachment:    Type:   Image     Comment:   External Document

## 2010-09-12 NOTE — Letter (Signed)
Summary: External Correspondence  External Correspondence   Imported By: Faythe Ghee 10/27/2009 13:10:27  _____________________________________________________________________  External Attachment:    Type:   Image     Comment:   External Document

## 2010-09-12 NOTE — Letter (Signed)
Summary: ECHO  ECHO   Imported By: Faythe Ghee 10/27/2009 13:11:11  _____________________________________________________________________  External Attachment:    Type:   Image     Comment:   External Document

## 2010-09-12 NOTE — Progress Notes (Signed)
Summary: Medications  Phone Note Call from Patient Call back at [phone]   Caller: Patient Reason for Call: Talk to Nurse Summary of Call: pt states that he gave the wrong dosage of Simvastatin/he said he was on 40mg  when he is really on 20mg /states that he and Dr.Wall discussed dosage and wants to know what dosage to take now/pls return call/tg Initial call taken by: Raechel Ache [facility],  October 27, 2009 8:45 AM  Follow-up for Phone Call        Pt. states that he has been on Simvastatin 20mg  for a while. He wants to know should he increase mg's or continue 20mg ? OV looks like we just refilled this med but the pt. insist that we refer this to you. Please advise. Follow-up by: Larita Fife Via LPN,  October 27, 2009 9:11 AM  Additional Follow-up for Phone Call Additional follow up Details #1::        Pt. called back today concerning Simvastatin mg. Pt. advised to start taking 40mg  since that is what he told us at appt. and due to LDL levels.   Additional Follow-up by: Larita Fife Via LPN,  October 28, 2009 5:00 PM     Appended Document: Medications Med list says 40. Give [employer]. OK with me if he takes 20. He is not at goal but he didnt want to switch to Crestor until he works hard on his diet.

## 2010-09-12 NOTE — Miscellaneous (Signed)
Summary: labs bmp,01/11/2009  Clinical Lists Changes  Observations: Added new observation of ALBUMIN: 4.2 g/dL (16/05/9603 54:09) Added new observation of SGPT (ALT): 20 units/L (01/12/2009 11:16) Added new observation of SGOT (AST): 25 units/L (01/12/2009 11:16) Added new observation of ALK PHOS: 50 units/L (01/12/2009 11:16) Added new observation of GFR AA: >59 mL/min/1.66m2 (01/12/2009 11:16) Added new observation of GFR: >59 mL/min (01/12/2009 11:16) Added new observation of CREATININE: 1.13 mg/dL (81/19/1478 29:56) Added new observation of BUN: 17 mg/dL (21/30/8657 84:69) Added new observation of BG RANDOM: 102 mg/dL (62/95/2841 32:44) Added new observation of CO2 PLSM/SER: 23 meq/L (01/12/2009 11:16) Added new observation of CL SERUM: 103 meq/L (01/12/2009 11:16) Added new observation of K SERUM: 4.9 meq/L (01/12/2009 11:16) Added new observation of NA: 139 meq/L (01/12/2009 11:16) Added new observation of LDL: 92 mg/dL (08/15/7251 66:44) Added new observation of HDL: 36 mg/dL (03/47/4259 56:38) Added new observation of TRIGLYC TOT: 161 mg/dL (75/64/3329 51:88) Added new observation of CHOLESTEROL: 160 mg/dL (41/66/0630 16:01)

## 2010-09-12 NOTE — Miscellaneous (Signed)
Summary: Lipids  Clinical Lists Changes  Observations: Added new observation of LDL: 78 mg/dL (60/45/4098 11:91) Added new observation of HDL: 35 mg/dL (47/82/9562 13:08) Added new observation of TRIGLYC TOT: 98 mg/dL (65/78/4696 29:52) Added new observation of CHOLESTEROL: 133 mg/dL (84/13/2440 10:27)

## 2010-09-12 NOTE — Assessment & Plan Note (Signed)
Summary: **PER DR.[facility] FOR CABG/F/U CAD/STRESS TEST/TG   Visit Type:  Initial Consult Primary Provider:  Osborne Casco  CC:  no cardiology complaints today.  History of Present Illness: Jeremy Hendricks is a 55 year old gentleman referred by Dr. Carylon Perches for establishing with [place] his cardiologist.  He had cardiac bypass grafting about 10 years ago. He was in [place] at that time. I do not have any outside records.  [place] He has received his care there for last 5 years. Again I do not have records.  He is having no angina or ischemic symptoms. He exercises routinely.  He receives all Dr. Ouida Sills who obtained blood work. I reviewed these with him today. His LDL is not at goal at 91. He is on simvastatin 40 mg and low dose Niacin.  He denies orthopnea, PND or peripheral edema. He has no palpitations.  He quit smoking at the time of his bypass surgery.  Current Medications (verified): 1)  Aspir-Trin 325 Mg Tbec (Aspirin) .... Take 1 Tab Daily 2)  Atenolol 25 Mg Tabs (Atenolol) .... Take 1 Tab Daily 3)  Niacin Cr 500 Mg Cr-Caps (Niacin) .... Take 2 Tabs Two Times A Day 4)  Daily Multi  Tabs (Multiple Vitamins-Minerals) .... Take 1 Tab Daily 5)  Simvastatin 40 Mg Tabs (Simvastatin) .... Take 1 Tab Daily 6)  Nitrostat 0.4 Mg Subl (Nitroglycerin) .Marland Kitchen.. 1 Tablet Under Tongue At Onset of Chest Pain; You May Repeat Every 5 Minutes For Up To 3 Doses.  Allergies (verified): No Known Drug Allergies  Past History:  Past Medical History: Last updated: 10/24/2009 Current Problems:  DEGENERATIVE JOINT DISEASE (ICD-715.90) ANGINA, HX OF (ICD-V12.50) CAD (ICD-414.00) HYPERLIPIDEMIA (ICD-272.4)  Past Surgical History: Last updated: 10/24/2009 coronary artery bypasss graft times 3 at age 37  Family History: Last updated: 10/24/2009 Father:had myocardial infarction at age 44  Social History: Last updated: 10/24/2009 Married  Tobacco Use - Former.  Alcohol Use  - no Regular Exercise - yes Drug Use - no  Risk Factors: Exercise: yes (10/24/2009)  Risk Factors: Smoking Status: quit (10/24/2009)  Review of Systems       negative other than history present illness  Vital Signs:  Patient profile:   55 year old male Height:      66 inches Weight:      193 pounds BMI:     31.26 Pulse rate:   56 / minute BP sitting:   121 / 72  (right arm)  Vitals Entered By: Dreama Saa, CNA (October 26, 2009 2:58 PM)  Physical Exam  General:  Well developed, well nourished, in no acute distress. Head:  normocephalic and atraumatic Eyes:  PERRLA/EOM intact; conjunctiva and lids normal. Neck:  Neck supple, no JVD. No masses, thyromegaly or abnormal cervical nodes. Chest Jeremy Hendricks:  well-healed Lungs:  Clear bilaterally to auscultation and percussion. Heart:  Non-displaced PMI, chest non-tender; regular rate and rhythm, S1, S2 without murmurs, rubs or gallops. Carotid upstroke normal, no bruit. Normal abdominal aortic size, no bruits. Femorals normal pulses, no bruits. Pedals normal pulses. No edema, no varicosities. Abdomen:  Bowel sounds positive; abdomen soft and non-tender without masses, organomegaly, or hernias noted. No hepatosplenomegaly. Msk:  Back normal, normal gait. Muscle strength and tone normal. Pulses:  pulses normal in all 4 extremities Extremities:  No clubbing or cyanosis. Neurologic:  Alert and oriented x 3. Skin:  Intact without lesions or rashes. Psych:  Normal affect.   Problems:  Medical Problems Added: 1)  Dx  of Cad, Artery Bypass Graft  (ICD-414.04)  EKG  Procedure date:  10/26/2009  Findings:      sinus bradycardia, T wave inversion in V1 through V3. No old EKG to compare.  Impression & Recommendations:  Problem # 1:  CAD, ARTERY BYPASS GRAFT (ICD-414.04) Assessment Unchanged  He is totally asymptomatic. His EKG is abnormal but I do not have an old one to compare. We will obtain outside records. He had a stress test  last year so we will wait to next year for repeat study. He is concerned about radiation having received a lot of CT angiograms in the past. I've changed none of his medications but I'm concerned about his LDL not being at goal. He would like to tighten up on his diet and repeat his values in 6 months rather than switching to Crestor 40 mg a day which I suggested. His updated medication list for this problem includes:    Aspir-trin 325 Mg Tbec (Aspirin) .Marland Kitchen... Take 1 tab daily    Atenolol 25 Mg Tabs (Atenolol) .Marland Kitchen... Take 1 tab daily    Nitrostat 0.4 Mg Subl (Nitroglycerin) .Marland Kitchen... 1 tablet under tongue at onset of chest pain; you may repeat every 5 minutes for up to 3 doses.  His updated medication list for this problem includes:    Aspir-trin 325 Mg Tbec (Aspirin) .Marland Kitchen... Take 1 tab daily    Atenolol 25 Mg Tabs (Atenolol) .Marland Kitchen... Take 1 tab daily    Nitrostat 0.4 Mg Subl (Nitroglycerin) .Marland Kitchen... 1 tablet under tongue at onset of chest pain; you may repeat every 5 minutes for up to 3 doses.  Problem # 2:  HYPERLIPIDEMIA (ICD-272.4)  The following medications were removed from the medication list:    Simcor 500-20 Mg Xr24h-tab (Niacin-simvastatin) .Marland Kitchen... Take 1 tab daily His updated medication list for this problem includes:    Niacin Cr 500 Mg Cr-caps (Niacin) .Marland Kitchen... Take 2 tabs two times a day    Simvastatin 40 Mg Tabs (Simvastatin) .Marland Kitchen... Take 1 tab daily  The following medications were removed from the medication list:    Simcor 500-20 Mg Xr24h-tab (Niacin-simvastatin) .Marland Kitchen... Take 1 tab daily His updated medication list for this problem includes:    Niacin Cr 500 Mg Cr-caps (Niacin) .Marland Kitchen... Take 2 tabs two times a day    Simvastatin 40 Mg Tabs (Simvastatin) .Marland Kitchen... Take 1 tab daily  Patient Instructions: 1)  Your physician recommends that you schedule a follow-up appointment in: 12 months 2)  Your physician recommends that you continue on your current medications as directed. Please refer to the Current  Medication list given to you today. Prescriptions: NITROSTAT 0.4 MG SUBL (NITROGLYCERIN) 1 tablet under tongue at onset of chest pain; you may repeat every 5 minutes for up to 3 doses.  #25 x 3   Entered by:   Larita Fife Via LPN   Authorized by:   Gaylord Shih, MD, [facility]   Signed by:   Larita Fife Via LPN on 16/05/9603   Method used:   Electronically to        [facility]  [phone]* (retail)       [address] [place], [place]  [address]       Ph: [phone] or [phone]       Fax: [phone]   RxID:   [id number] SIMVASTATIN 40 MG TABS (SIMVASTATIN) take 1 tab daily  #30 x 11   Entered by:   Larita Fife Via LPN  Authorized by:   Gaylord Shih, MD, [facility]   Signed by:   Larita Fife Via LPN on 16/05/9603   Method used:   Electronically to        [facility]  [phone]* (retail)       [address] [place], [place]  [address]       Ph: [phone] or [phone]       Fax: [phone]   RxID:   [id number] ATENOLOL 25 MG TABS (ATENOLOL) take 1 tab daily  #30 x 11   Entered by:   Larita Fife Via LPN   Authorized by:   Gaylord Shih, MD, [facility]   Signed by:   Larita Fife Via LPN on 74/25/9563   Method used:   Electronically to        [facility]  [phone]* (retail)       [address], [place]  [address]       Ph: [phone] or [phone]       Fax: [phone]   RxID:   [id number]

## 2010-09-12 NOTE — Progress Notes (Signed)
**Note De-Identified Jd Mccaster Obfuscation** Summary: rx never got called in  Phone Note Call from Patient Call back at Home Phone [phone]   Caller: pt Reason for Call: Refill Medication Summary of Call: pt needs nitro, atenolol and simvastatin 20mg  called in for 3 month supply to [facility] on [address]. Initial call taken by: Faythe Ghee,  November 02, 2009 2:59 PM    Prescriptions: NITROSTAT 0.4 MG SUBL (NITROGLYCERIN) 1 tablet under tongue at onset of chest pain; you may repeat every 5 minutes for up to 3 doses.  #25 x 3   Entered by:   Teressa Lower RN   Authorized by:   Gaylord Shih, MD, [facility]   Signed by:   Teressa Lower RN on 11/02/2009   Method used:   Electronically to        [facility]  [phone]* (retail)       [address]       [place], [place]  [address]       Ph: [phone] or [phone]       Fax: [phone]   RxID:   [id number] SIMVASTATIN 40 MG TABS (SIMVASTATIN) take 1 tab daily  #30 x 0   Entered by:   Teressa Lower RN   Authorized by:   Gaylord Shih, MD, [facility]   Signed by:   Teressa Lower RN on 11/02/2009   Method used:   Electronically to        [facility]  [phone]* (retail)       [address]       [place], [place]  [address]       Ph: [phone] or [phone]       Fax: [phone]   RxID:   [id number] ATENOLOL 25 MG TABS (ATENOLOL) take 1 tab daily  #90 x 3   Entered by:   Teressa Lower RN   Authorized by:   Gaylord Shih, MD, [facility]   Signed by:   Teressa Lower RN on 11/02/2009   Method used:   Electronically to        [facility]  [phone]* (retail)       [address]       [place], [place]  [address]       Ph: [phone] or [phone]       Fax: [phone]   RxID:   [id number]

## 2010-09-12 NOTE — Letter (Signed)
Summary: EKG  EKG   Imported By: Faythe Ghee 10/27/2009 13:10:46  _____________________________________________________________________  External Attachment:    Type:   Image     Comment:   External Document

## 2010-09-14 ENCOUNTER — Encounter: Payer: Self-pay | Admitting: Cardiology

## 2010-09-14 ENCOUNTER — Encounter (INDEPENDENT_AMBULATORY_CARE_PROVIDER_SITE_OTHER): Payer: BC Managed Care – PPO | Admitting: Thoracic Surgery (Cardiothoracic Vascular Surgery)

## 2010-09-14 DIAGNOSIS — I2510 Atherosclerotic heart disease of native coronary artery without angina pectoris: Secondary | ICD-10-CM

## 2010-09-14 NOTE — Assessment & Plan Note (Signed)
Summary: [facility]   Visit Type:  Initial Consult Primary Provider:  Osborne Casco  CC:  follow up stress test.  History of Present Illness: Mr. Beyl is a 55 year old male is referred by Dr. Ouida Sills for chest discomfort a history of coronary artery disease status post cardiac bypass grafting 12 years ago. This was in [place].  At that time he presented with exertional chest tightness. I do not have any records but apparently had three-vessel disease.  Sunday morning, he awoke and stretched. In the middle the stretch he had severe substernal chest discomfort. He laid back down but it came back. He then took 2 old nitroglycerin but asked to swallow them. EMS was called by his girlfriend. His discomfort resolved and he did not go to the hospital.  He subsequently ran and had sex. He had no discomfort.  He saw Dr. Fagan this week. We arranged for him to have a stress Myoview which he had today. He has excellent exercise tolerance, blood pressure response was normal, but he had significant downsloping ST segment changes inferolaterally. These did not resolve quickly in recovery. Pulmonary scan shows lateral Sherl Yzaguirre ischemia. EF was 61%.  Current Medications (verified): 1)  Aspir-Trin 325 Mg Tbec (Aspirin) .... Take 1 Tab Daily 2)  Atenolol 25 Mg Tabs (Atenolol) .... Take 1 Tab Daily 3)  Daily Multi  Tabs (Multiple Vitamins-Minerals) .... Take 1 Tab Daily 4)  Crestor 20 Mg Tabs (Rosuvastatin Calcium) .... Take One Tablet By Mouth Daily. 5)  Nitrostat 0.4 Mg Subl (Nitroglycerin) .... 1 Tablet Under Tongue At Onset of Chest Pain; You May Repeat Every 5 Minutes For Up To 3 Doses.  Allergies (verified): No Known Drug Allergies  Past History:  Past Surgical History: Last updated: 10/24/2009 coronary artery bypasss graft times 3 at age 43  Family History: Last updated: 08/23/2010 Father:had myocardial infarction at age 48 Family History of Cancer:  Family History of Coronary Artery  Disease:  Family History of Hyperlipidemia:   Social History: Last updated: 08/23/2010 Tobacco Use - Former. 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 Head:  normocephalic and atraumatic Eyes:  PERRLA/EOM intact; conjunctiva and lids normal. Neck:  Neck supple, no JVD. No masses, thyromegaly or abnormal cervical nodes. Chest [place]:  no deformities or breast masses noted Lungs:  Clear bilaterally to auscultation  and percussion. Heart:  regular rate and rhythm, soft systolic murmur, S2 splits, no carotid bruit Abdomen:  soft, good bowel sounds, no midline bruit Msk:  Back normal, normal gait. Muscle strength and tone normal. Pulses:  pulses normal in all 4 extremities Extremities:  No clubbing or cyanosis. Neurologic:  Alert and oriented x 3. Skin:  Intact without lesions or rashes. Psych:  Normal affect.   Problems:  Medical  Problems Added: 1)  Dx of Chest Pain-precordial  (ZOX-096.04)  Impression & Recommendations:  Problem # 1:  CHEST PAIN-PRECORDIAL (ICD-786.51) I am concerned that he has a bypass graft to the inferior lateral Shiron Whetsel there perhaps is in trouble. After a long time today talking about the indication, risks, and benefits of the catheterization. He needs to drive to [place] on Friday for his grandmother's funeral. Staff was able to get his catheterization scheduled for the morning. He understands that this not a tight lesion that we can set up a PCI if indicated next week. I assured him we would do what was appropriate and in his best interest. His updated medication list for this problem includes:    Aspir-trin 325 Mg Tbec (Aspirin) .Marland Kitchen... Take 1 tab daily    Atenolol 25 Mg Tabs (Atenolol) .Marland Kitchen... Take 1 tab daily    Nitrostat 0.4 Mg Subl (Nitroglycerin) .Marland Kitchen... 1 tablet under tongue at onset of chest pain; you may repeat every 5 minutes for up to 3 doses.  Problem # 2:  CAD, ARTERY BYPASS GRAFT (ICD-414.04)  His updated medication list for this problem includes:    Aspir-trin 325 Mg Tbec (Aspirin) .Marland Kitchen... Take 1 tab daily    Atenolol 25 Mg Tabs (Atenolol) .Marland Kitchen... Take 1 tab daily    Nitrostat 0.4 Mg Subl (Nitroglycerin) .Marland Kitchen... 1 tablet under tongue at onset of chest pain; you may repeat every 5 minutes for up to 3 doses.  Patient Instructions: 1)  Your physician recommends that you schedule a follow-up appointment in: TBA after cath 2)  Your physician recommends that you continue on your current medications as directed. Please refer to the Current Medication list given to you today. 3)  Your physician has requested that you have a cardiac catheterization  on JANUARY 12,2012 AT 7:30AM WITH DR. HOCHREIN. Cardiac catheterization is used to diagnose and/or treat various heart conditions. Doctors may recommend this procedure for a number of different reasons. The most common reason is to evaluate chest pain. Chest  pain can be a symptom of coronary artery disease (CAD), and cardiac catheterization can show whether plaque is narrowing or blocking your heart's arteries. This procedure is also used to evaluate the valves, as well as measure the blood flow and oxygen levels in different parts of your heart.  For further information please visit [email or web address]  Please follow instruction sheet, as given. Prescriptions: NITROSTAT 0.4 MG SUBL (NITROGLYCERIN) 1 tablet under tongue at onset of chest pain; you may repeat every 5 minutes for up to 3 doses.  #25 x 6   Entered by:   Hardin Negus, RMA   Authorized by:   Gaylord Shih, MD, [facility]   Signed by:   Hardin Negus, RMA on 08/23/2010   Method used:   Electronically to        [facility]  [phone]* (retail)       [address]       [place], [place]  [address]       Ph:  [phone] or [phone]       Fax: [phone]   RxID:   [id number]

## 2010-09-14 NOTE — Miscellaneous (Signed)
Summary: Orders Update add testosterone per Dr. Ouida Sills  Clinical Lists Changes  Orders: Added new Test order of T-Testosterone; Total [id number]) - Signed

## 2010-09-14 NOTE — Letter (Signed)
Summary: Cardiac Catheterization Instructions- Main Lab  [facility], Main Office  [address]   [place], [place] [address]   Phone: [phone]  Fax: [phone]     08/23/2010 MRN: [id number]  [facility] [address] [place], [place]  [address]  Dear Mr. Budreau,   You are scheduled for Cardiac Catheterization on thursday August 24, 2010 with Dr.Hochrein            .  Please arrive at the [facility] at 5:30    a.m. on the day of your procedure.  1. DIET     _x Nothing to eat or drink after midnight except your medications with a sip of water.   2. MAKE SURE YOU TAKE YOUR ASPIRIN.   3.   ___x_ YOU MAY TAKE ALL of your remaining medications with a small amount of water.    4. Plan for one night stay - bring personal belongings (i.e. toothpaste, toothbrush, etc.)  5. Bring a current list of your medications and current insurance cards.  6. Must have a responsible person to drive you home.   8. Someone must be with yu for the first 24 hours after you arrive home.  9. Please wear clothes that are easy to get on and off and wear slip-on shoes.  *Special note: Every effort is made to have your procedure done on time.  Occasionally there are emergencies that present themselves at the hospital that may cause delays.  Please be patient if a delay does occur.  If you have any questions after you get home, please call the office at the number listed above.  Lisabeth Devoid RN

## 2010-09-14 NOTE — Progress Notes (Signed)
Summary: nuc pre-procedure  Phone Note Outgoing Call   Call placed by: Domenic Polite, CNMT,  August 22, 2010 10:13 AM Call placed to: Patient Reason for Call: Confirm/change Appt Summary of Call: Reviewed information on Myoview Information Sheet (see scanned document for further details).  Spoke with patient.      Nuclear Med Background Indications for Stress Test: Evaluation for Ischemia, Graft Patency   History: CABG, Echo  History Comments: '00 cabg x 3([place]); '07 Stress Echo-nml     Nuclear Pre-Procedure Cardiac Risk Factors: Family History - CAD, History of Smoking, Hypertension, Lipids Height (in): 66

## 2010-09-14 NOTE — Assessment & Plan Note (Signed)
Summary: Cardiology Nuclear Testing  Nuclear Med Background Indications for Stress Test: Evaluation for Ischemia, Graft Patency   History: CABG, Echo  History Comments: '00 cabg x 3([place]); '07 Stress Echo-nml  Symptoms: Chest Pain, Chest Pressure  Symptoms Comments: Last episode of CP Sunday AM.   Nuclear Pre-Procedure Cardiac Risk Factors: Family History - CAD, History of Smoking, Hypertension, Lipids Caffeine/Decaff Intake: None NPO After: 9:00 AM Lungs: Clear IV 0.9% NS with Angio Cath: 18g     IV Site: R Antecubital IV Started by: Stanton Kidney, EMT-P Chest Size (in) 42     Height (in): 68 Weight (lb): 193 BMI: 29.45 Tech Comments: Atenolol held > 24 hours, per patient.   Nuclear Med Study 1 or 2 day study:  1 day     Stress Test Type:  Stress Reading MD:  Cassell Clement, MD     Referring MD:  Dr. Valera Castle Resting Radionuclide:  Technetium 67m Tetrofosmin     Resting Radionuclide Dose:  11 mCi  Stress Radionuclide:  Technetium 13m Tetrofosmin     Stress Radionuclide Dose:  33 mCi   Stress Protocol Exercise Time (min):  14:15 min     Max HR:  148 bpm     Predicted Max HR:  167 bpm  Max Systolic BP: 190 mm Hg     Percent Max HR:  88.62 %     METS: 17.2 Rate Pressure Product:  78295    Stress Test Technologist:  Bonnita Levan, RN     Nuclear Technologist:  Domenic Polite, CNMT  Rest Procedure  Myocardial perfusion imaging was performed at rest 45 minutes following the intravenous administration of Technetium 49m Tetrofosmin.  Stress Procedure  The patient exercised for 14:15.  The patient stopped due to leg fatigue and dyspnea and he denied any chest pain.  There were significant ST-T wave changes with exercise with occ. PVC's.  Technetium 72m Tetrofosmin was injected at peak exercise and myocardial perfusion imaging was performed after a brief delay. Patient's EKG and images given to Dr. Daleen Squibb, and patient seen by Dr. Daleen Squibb this afternoon.  QPS Raw Data Images:   Normal; no motion artifact; normal heart/lung ratio. Stress Images:  Decreased uptake in distal lateral and apex Rest Images:  Normal homogeneous uptake in all areas of the myocardium. Subtraction (SDS):  Reversible distal lateral and apical ischemia. Transient Ischemic Dilatation:  .97  (Normal <1.22)  Lung/Heart Ratio:  .97  (Normal <0.45)  Quantitative Gated Spect Images QGS EDV:  92 ml QGS ESV:  36 ml QGS EF:  61 % QGS cine images:  No segmental wall motion abnormalities.  Findings High risk nuclear study Clinically Abnormal (chest pain, ST abnormality, hypotension) Evidence for lateral ischemia      Overall Impression  Exercise Capacity: Good exercise capacity. BP Response: Normal blood pressure response. Clinical Symptoms: dyspnea ECG Impression: Significant ST abnormalities consistent with ischemia. Overall Impression: High risk stress nuclear study. Overall Impression Comments: Suggestive of reversible ischemia in distal lateral wall and apex. Cath scheduled for the a.m.  Nuclear Med Background Indications for Stress Test: Evaluation for Ischemia, Graft Patency   History: CABG, Echo  History Comments: '00 cabg x 3([place]); '07 Stress Echo-nml  Symptoms: Chest Pain, Chest Pressure  Symptoms Comments: Last episode of CP Sunday AM.   Nuclear Pre-Procedure Cardiac Risk Factors: Family History - CAD, History of Smoking, Hypertension, Lipids Caffeine/Decaff Intake: None NPO After: 9:00 AM Lungs: Clear IV 0.9% NS with Angio Cath: 18g  IV Site: R Antecubital IV Started by: Stanton Kidney, EMT-P Chest Size (in) 42     Height (in): 68 Weight (lb): 193 BMI: 29.45 Tech Comments: Atenolol held > 24 hours, per patient.   Nuclear Med Study 1 or 2 day study:  1 day     Stress Test Type:  Stress Reading MD:  Cassell Clement, MD     Referring MD:  Dr. Valera Castle Resting Radionuclide:  Technetium 74m Tetrofosmin     Resting Radionuclide Dose:  11 mCi  Stress  Radionuclide:  Technetium 71m Tetrofosmin     Stress Radionuclide Dose:  33 mCi   Stress Protocol Exercise Time (min):  14:15 min     Max HR:  148 bpm     Predicted Max HR:  167 bpm  Max Systolic BP: 190 mm Hg     Percent Max HR:  88.62 %     METS: 17.2 Rate Pressure Product:  16109    Stress Test Technologist:  Bonnita Levan, RN     Nuclear Technologist:  Domenic Polite, CNMT  Rest Procedure  Myocardial perfusion imaging was performed at rest 45 minutes following the intravenous administration of Technetium 42m Tetrofosmin.  Stress Procedure  The patient exercised for 14:15.  The patient stopped due to leg fatigue and dyspnea and he denied any chest pain.  There were significant ST-T wave changes with exercise with occ. PVC's.  Technetium 64m Tetrofosmin was injected at peak exercise and myocardial perfusion imaging was performed after a brief delay. Patient's EKG and images given to Dr. Daleen Squibb, and patient seen by Dr. Daleen Squibb this afternoon.  QPS Raw Data Images:  Normal; no motion artifact; normal heart/lung ratio. Stress Images:  Decreased uptake in distal lateral and apex Rest Images:  Normal homogeneous uptake in all areas of the myocardium. Subtraction (SDS):  Reversible distal lateral and apical ischemia. Transient Ischemic Dilatation:  .97  (Normal <1.22)  Lung/Heart Ratio:  .97  (Normal <0.45)  Quantitative Gated Spect Images QGS EDV:  92 ml QGS ESV:  36 ml QGS EF:  61 % QGS cine images:  No segmental wall motion abnormalities.  Findings High risk nuclear study Clinically Abnormal (chest pain, ST abnormality, hypotension) Evidence for lateral ischemia      Overall Impression  Exercise Capacity: Good exercise capacity. BP Response: Normal blood pressure response. Clinical Symptoms: dyspnea ECG Impression: Significant ST abnormalities consistent with ischemia. Overall Impression: High risk stress nuclear study. Overall Impression Comments: Suggestive of reversible  ischemia in distal lateral wall and apex. Cath scheduled for the a.m.

## 2010-09-14 NOTE — Cardiovascular Report (Signed)
Summary: Cath/Percutaneous Orders   Cath/Percutaneous Orders   Imported By: Roderic Ovens 09/07/2010 14:31:50  _____________________________________________________________________  External Attachment:    Type:   Image     Comment:   External Document

## 2010-09-14 NOTE — Letter (Signed)
Summary: DR [facility] RECORDS  DR [facility]   Imported By: Faythe Ghee 08/24/2010 10:44:55  _____________________________________________________________________  External Attachment:    Type:   Image     Comment:   External Document  Appended Document: DR [facility]  Reviewed Juanito Doom, MD

## 2010-09-19 ENCOUNTER — Encounter: Payer: Self-pay | Admitting: Physician Assistant

## 2010-09-19 ENCOUNTER — Ambulatory Visit (HOSPITAL_COMMUNITY)
Admission: RE | Admit: 2010-09-19 | Discharge: 2010-09-19 | Disposition: A | Discharge status: Home / Self Care | Payer: BC Managed Care – PPO | Source: Ambulatory Visit | Attending: Physician Assistant | Admitting: Physician Assistant

## 2010-09-19 ENCOUNTER — Other Ambulatory Visit: Payer: Self-pay | Admitting: Physician Assistant

## 2010-09-19 ENCOUNTER — Encounter (INDEPENDENT_AMBULATORY_CARE_PROVIDER_SITE_OTHER): Payer: BC Managed Care – PPO | Admitting: Physician Assistant

## 2010-09-19 ENCOUNTER — Other Ambulatory Visit: Payer: BC Managed Care – PPO

## 2010-09-19 DIAGNOSIS — G4700 Insomnia, unspecified: Secondary | ICD-10-CM | POA: Insufficient documentation

## 2010-09-19 DIAGNOSIS — R52 Pain, unspecified: Secondary | ICD-10-CM

## 2010-09-19 DIAGNOSIS — J90 Pleural effusion, not elsewhere classified: Secondary | ICD-10-CM | POA: Insufficient documentation

## 2010-09-19 DIAGNOSIS — R0602 Shortness of breath: Secondary | ICD-10-CM

## 2010-09-19 DIAGNOSIS — I2510 Atherosclerotic heart disease of native coronary artery without angina pectoris: Secondary | ICD-10-CM

## 2010-09-19 DIAGNOSIS — Z8679 Personal history of other diseases of the circulatory system: Secondary | ICD-10-CM

## 2010-09-19 LAB — BASIC METABOLIC PANEL
GFR: 58.58 mL/min — ABNORMAL LOW (ref >60.00)
Glucose, Bld: 98 mg/dL (ref 70–99)
Potassium: 4.9 mEq/L (ref 3.5–5.1)
Sodium: 137 mEq/L (ref 135–145)

## 2010-09-20 NOTE — Progress Notes (Signed)
Summary: pt can't sleep and is having sob  Phone Note Call from Patient Call back at Home Phone [phone]   Caller: Patient Summary of Call: Pt had heart surgery two weeks ago and pt states he is not sleeping well feels his lungs are full and he is having sob Initial call taken by: Judie Grieve,  September 12, 2010 3:46 PM  Follow-up for Phone Call        I talked with pt who said he "felt like he over did it yesterday" He was short of breath and had not been sleeping well.  He is still taking lasix and doing his breathing exercises. He has an appt with Dr. [name] on 09/14/10.  He did take a sleeping pill and pain medication last pm. Slept well until 3am according to pt and had the "gurgling sound" again. He is asymptomatic at this time.  He will take it easy today. Reassurance given.   Follow-up by: Lisabeth Devoid RN,  September 13, 2010 8:45 AM

## 2010-09-20 NOTE — Consult Note (Signed)
Summary: [facility]  [facility]   Imported By: Marylou Mccoy 09/15/2010 12:31:41  _____________________________________________________________________  External Attachment:    Type:   Image     Comment:   External Document

## 2010-09-21 NOTE — Discharge Summary (Signed)
  [name], [name]:  [id number]  MEDICAL RECORD NO.:  [id number]           PATIENT TYPE:  LOCATION:                                 FACILITY:  PHYSICIAN:  Almer Bushey. [name], M.D.DATE OF BIRTH:  October 27, 1955  DATE OF ADMISSION: DATE OF DISCHARGE:                              DISCHARGE SUMMARY   ADDENDUM  The patient was seen and evaluated by Dr. [name] today.  He is felt to be stable and ready for discharge to home.  Due to the patient's persistent left pleural effusion, it was felt that he would benefit from a steroid prednisone taper pack.  We will give him 25 mg of prednisone prior to discharge and then discharge the patient home on tapering doses of starting tomorrow from 20 mg to 5 mg.  The patient is in agreement. For further details of the patient's discharge summary as well as followup appointments and discharge medications, please see dictated discharge summary.     Sol Blazing, PA   ______________________________ Salvatore Decent [name], M.D.    KMD/MEDQ  D:  09/04/2010  T:  09/05/2010  Job:  [id number]  Electronically Signed by Cameron Proud PA on 09/13/2010 12:28:55 PM Electronically Signed by Charlett Lango M.D. on 09/21/2010 10:22:54 AM

## 2010-09-25 NOTE — Assessment & Plan Note (Signed)
OFFICE VISIT  SHAWNEE, HIGHAM DOB:  April 22, 1956                                        September 14, 2010 CHART #:  [id number]  REASON FOR VISIT:  Followup after recent coronary bypass grafting.  HISTORY OF PRESENT ILLNESS:  The patient is a 55 year old gentleman who had redo coronary bypass grafting x2 to the left radial and a free right internal mammary artery on August 30, 2010.  Postoperatively, he had some atrial fibrillation that resolved with amiodarone.  He also had a left pleural effusion.  We did a left thoracentesis.  He did still have some small residual effusion at the time he left the hospital.  He has now been home for about 10 days or so and he is just a little concerned with how he is doing, he sometimes feels like he is gurgling when he lies flat.  He is also having some trouble sleeping and says his appetite is poor.  He has been losing some weight.  He is having a very little pain in his chest.  He does have some pain near his right wrist from the radial artery harvest incision.  He says he is walking 2 miles a day and he can walk up a flight of stairs without getting short of breath, but he is just a little concerned that he did not have the attention span, ability to concentrate for a long periods of time that he had prior to surgery.  CURRENT MEDICATIONS: 1. Ambien 10 mg nightly p.r.n. 2. Crestor 20 mg nightly. 3. Amiodarone 400 mg b.i.d. 4. Atenolol 25 mg daily. 5. Imdur 30 mg daily. 6. Enteric-coated aspirin 325 mg daily. 7. Multivitamin 1 daily. 8. Oxycodone, he is taking 1 about 4 times daily for pain.  ALLERGIES:  He has no known drug allergies.  PHYSICAL EXAMINATION:  General:  The patient is a very well-appearing 50- year-old gentleman, in no acute distress.  Vital Signs:  His blood pressure is 113/68, pulse 66, respirations are 18, and oxygen saturation is 98% on room air.  His weight is 188 pounds.  His baseline is  about 194.  Neurological:  He is alert and oriented x3 with no deficits. Chest:  His sternal incision is clean, dry, and intact.  There is some eschar at his chest tube site.  His sternum is stable.  Cardiac: Regular rate and rhythm.  Normal S1 and S2.  No rubs, murmurs, or gallops.  Lungs:  Slightly diminished at both bases and equal.  There is also may be a slight congestion, but no true rales, no wheezes and again it is symmetrical bilaterally.  Extremities:  He does have trace edema in both lower extremities.  IMPRESSION:  The patient is about 2 weeks out from redo coronary bypass grafting and he is doing really exceptionally well.  All things considered, he voiced whatever he would expect after the second time operation, this early on in his course.  His exercise tolerance is excellent.  He is having minimal discomfort except he does still have a pain near his wrist.  Overall, I am very pleased with how he is doing. He is having some trouble with sleeping, the Ambien is helping that.  I gave him a new prescription for that.  Also, I gave him a new prescription for oxycodone, 50 tablets of that  and he is taking 1 or 2 about 4-5 times a day, and also I advised him he can take Tylenol or ibuprofen along with the Advil if needed.  I have just reassured him that he is going through the normal phase as usually the second or third week after the surgery.  He feels a little depressed, poor appetite, lack of energy, lack of stamina, some sleep disturbance and all of these things are part of the course and I really think he is on pace and he was reassured by that.  I have an appointment with him in 2 weeks.  We are going to get a chest x-ray at that time and we will check on his progress at that time as well.  Salvatore Decent [name], M.D. Electronically Signed  SCH/MEDQ  D:  09/14/2010  T:  09/15/2010  Job:  [id number]  cc:   Thomas C. Daleen Squibb, MD, [facility]. Ouida Sills, MD

## 2010-09-26 ENCOUNTER — Other Ambulatory Visit: Payer: BC Managed Care – PPO

## 2010-09-27 ENCOUNTER — Other Ambulatory Visit: Payer: Self-pay | Admitting: Thoracic Surgery (Cardiothoracic Vascular Surgery)

## 2010-09-27 DIAGNOSIS — Z9889 Other specified postprocedural states: Secondary | ICD-10-CM

## 2010-09-27 DIAGNOSIS — I2510 Atherosclerotic heart disease of native coronary artery without angina pectoris: Secondary | ICD-10-CM

## 2010-09-28 ENCOUNTER — Encounter (INDEPENDENT_AMBULATORY_CARE_PROVIDER_SITE_OTHER): Payer: Self-pay | Admitting: Thoracic Surgery (Cardiothoracic Vascular Surgery)

## 2010-09-28 DIAGNOSIS — I2510 Atherosclerotic heart disease of native coronary artery without angina pectoris: Secondary | ICD-10-CM

## 2010-09-28 NOTE — Assessment & Plan Note (Signed)
Summary: eph/d/c 09/04/10 cone/per pt call=mj   Visit Type:  EPH Primary Provider:  Osborne Casco  CC:  no angina ...sob....  History of Present Illness: Primary Cardiologist:  Dr. Valera Castle  Jeremy Hendricks is a 55 yo male with a h/o CAD, s/p CABG in 2002.    He saw Dr. Daleen Squibb recently with chest discomfort and was noted to have an abnormal Myoview.  He was referred for cardiac catheterization.  This demonstrated severe graft disease and native three-vessel CAD.  He was referred for redo bypass.  His L-LAD remained patent.  His grafts at his surgery on 09/01/10 with Dr. [name] included a left radial-PDA and a free RIMA-ramus intermedius.  Postoperatively he developed atrial fibrillation with rapid ventricular rate and was placed on amiodarone.  He did suffer from blood loss anemia and underwent transfusion with packed red blood cells.  He also developed a left pleural effusion that was treated with thoracentesis.  He returns today for followup.  Labs 09/04/10:  Na 138, K 3.6, Creat 1.31; Hgb 8.9  Overall he seems to be doing well.  He is still short of breath with exertion.  He still sleeping on approximately 3-4 pillows.  He denies PND.  He denies lower extremity edema.  His chest is somewhat sore.  He felt like he had some flulike symptoms this past weekend.  This lasted for one day.  He did not check his temperature.  He is coughing.  This is nonproductive.  He's had some trouble sleeping.  Dr. [name] has given him Ambien and he's also had some trouble with constipation.  He is taking oxycodone about 2-3 times a day  Current Medications (verified): 1)  Aspirin Ec 325 Mg Tbec (Aspirin) .... Take One Tablet By Mouth Daily 2)  Atenolol 25 Mg Tabs (Atenolol) .... Take 1 Tab Daily 3)  Daily Multi  Tabs (Multiple Vitamins-Minerals) .... Take 1 Tab Daily 4)  Crestor 20 Mg Tabs (Rosuvastatin Calcium) .... Take One Tablet By Mouth Daily. 5)  Nitrostat 0.4 Mg Subl (Nitroglycerin) .Marland Kitchen.. 1 Tablet  Under Tongue At Onset of Chest Pain; You May Repeat Every 5 Minutes For Up To 3 Doses. 6)  Amiodarone Hcl 200 Mg Tabs (Amiodarone Hcl) .Marland Kitchen.. 1 Tab Two Times A Day 7)  Oxycodone Hcl 5 Mg Tabs (Oxycodone Hcl) .Marland Kitchen.. 1-2 Tab As Needed 8)  Isosorbide Mononitrate Cr 30 Mg Xr24h-Tab (Isosorbide Mononitrate) .Marland Kitchen.. 1 Tab Once Daily 9)  Ambien 10 Mg Tabs (Zolpidem Tartrate) .... 1/2 Tab At Bedtime  Allergies (verified): No Known Drug Allergies  Past History:  Past Medical History: CAD (ICD-414.00)   a.  s/p CABG in 2002   b.  s/p redo CABG January 2012: L-LAD remained from original CABG; new grafts incl L radial-PDA + RIMA-RI   c. EF 60% at cardiac catheterization 08/24/10 Postoperative atrial fibrillation   a.  Amiodarone therapy HYPERLIPIDEMIA (ICD-272.4) DEGENERATIVE JOINT DISEASE (ICD-715.90)  Review of Systems       He has some constipation.  He has noted some lightheadedness.  Otherwise, as per  the HPI.  All other systems reviewed and negative.   Vital Signs:  Patient profile:   55 year old male Height:      68 inches Weight:      192.50 pounds BMI:     29.38 Pulse rate:   66 / minute Pulse rhythm:   irregular BP sitting:   138 / 78  (left arm) Cuff size:   regular  Vitals Entered By:  Danielle Rankin, CMA (September 19, 2010 8:56 AM)  Physical Exam  General:  Well nourished, well developed, in no acute distress HEENT: normal Neck: + JVD Cardiac:  normal S1, S2; RRR; no murmur Lungs:  decreased  breath sounds at the bases; + egophony bilat. but covers larger area on the left Abd: soft, nontender, no hepatomegaly Ext: trace bilat ankle edema Skin: warm and dry Neuro:  CNs 2-12 intact, no focal abnormalities noted    EKG  Procedure date:  09/19/2010  Findings:      normal sinus rhythm Heart rate 66 Normal axis Nonspecific ST-T wave changes  Impression & Recommendations:  Problem # 1:  SHORTNESS OF BREATH (ICD-786.05) By exam, I think his effusion on the left  is  larger.  I have suggested that we continue diuresis for another week.  He will take Lasix 40 mg a day and potassium 20 mEq a day for one week.  He will have a basic metabolic panel today.  I will repeat a basic metabolic panel in one week.  He will have a  chest x-ray today.  If his effusion is much larger, I may need to get him back to see Dr. [name] sooner.  I will bring him back to see me in 2 weeks to followup.    Orders: TLB-BMP (Basic Metabolic Panel-BMET) (80048-METABOL) T-2 View CXR (71020TC)  Problem # 2:  POST-OPERATIVE ATRIAL FIBRILLATION (ICD-427.31) He is maintaining normal sinus rhythm.  I'll have him cut back on his amiodarone to one tablet a day in about 2 weeks.  We will eventually try to get him off of amiodarone.  Problem # 3:  CAD, ARTERY BYPASS GRAFT (ICD-414.04) Overall, he is recovering well.  He does not have any interest in cardiac rehabilitation.  He will do this on his own.  I have ncouraged him to continue walking.  He will continue on aspirin.  I have recommended he try to take tylenol instead of oxycodone if he can to cut back on his constipation.  Orders: TLB-BMP (Basic Metabolic Panel-BMET) (80048-METABOL)  Problem # 4:  INSOMNIA (ICD-780.52) I asked him to followup with his PCP or Dr. [name] for his RX.  Problem # 5:  HYPERLIPIDEMIA (ICD-272.4) His updated medication list for this problem includes:    Crestor 20 Mg Tabs (Rosuvastatin calcium) .Marland Kitchen... Take one tablet by mouth daily.  Other Orders: EKG w/ Interpretation (93000)  Patient Instructions: 1)  You can take Tylenol (Acetaminophen) 500 mg - 2 tablets every 6 hours as needed for pain. 2)  Your physician recommends that you schedule a follow-up appointment in: 10/03/10 @ 8:30 to see Tereso Newcomer, PA-C. 3)  Your physician recommends that you return for lab work in: [place] BMET 786.05, 414.01....Marland KitchenMarland KitchenRepeat BMET 786.05, 414.01 09/26/10 4)  A chest x-ray takes a picture of the organs and structures  inside the chest, including the heart, lungs, and blood vessels. This test can show several things, including, whether the heart is enlarged; whether fluid is building up in the lungs; and whether pacemaker / defibrillator leads are still in place. Tereso Newcomer PA-C would like for this to be done today 5)  Your physician has recommended you make the following change in your medication: IN 2 WEEKS DECREASE AMIODARONE 200 MG TO 1 TAB ONCE DAILY, ALSO STARTING TODAY DECREASE ISOSORBIDE TO 1/2 TAB ONCE DAILY. 6)  CALL DR. FAGAN OR DR. HENDRICKSON ABOUT ABMIEN REFILL  Appended Document: eph/d/c 09/04/10 cone/per pt call=mj Clinical Data: Shortness of  breath, recent open-heart surgery.   CHEST - 2 VIEW   Comparison: 09/04/2010   Findings: The lung volumes are low.  Persistent bibasilar consolidation is seen with pleural effusions, left greater than right which have decreased compared to most recent exam.  There is no pulmonary edema.  No pneumothorax is seen.  The heart is unchanged in size and contour and there is evidence of prior median sternotomy / CABG.  The upper abdomen and osseous structures are normal.   IMPRESSION: Improved aeration of the lung bases with persistent small bilateral effusions and associated basilar consolidation.  No new findings.   Original Report Authenticated By: Hiram Gash, M.D.  _____________________________________________________________________  External Attachment:    Type:     Image     Comment:  DG CHEST 2 VIEW - [id number] EPC  Signed by Tereso Newcomer PA-C on 09/19/2010 at 3:28 PM  ________________________________________________________________________ With reduced sizes of his effusions, I have recommended holding off on taking lasix and potassium. The CMA has already contacted him today. He does not need a bmet in one week.    Signed by Tereso Newcomer PA-C on 09/19/2010 at 3:28  PM  ________________________________________________________________________

## 2010-09-29 NOTE — Assessment & Plan Note (Signed)
OFFICE VISIT  DONTAY, HARM DOB:  05/07/1956                                        September 28, 2010 CHART #:  [id number]  The patient is a 55 year old gentleman who underwent redo bypass grafting on August 30, 2010.  He had presented with a new-onset angina and catheterization.  He had a patent mammary to his LAD, but diseased vein graft and then another vein graft which was totally occluded.  We did a left radial artery and a free right internal mammary artery.  He had good quality conduits with poor quality target vessels. Postoperatively, he had some atrial fibrillation and resolved with amiodarone.  He then subsequently had a left pleural effusion.  We did a thoracentesis.  He was seen in the office 2 weeks ago at which time he was having a lot of trouble sleeping.  His appetite was poor.  He was just feeling at that time tired and run down.  He is very emotional at that time.  He says since then he has been getting better, but he still complains of some dyspnea with exertion.  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  Also if he walks at a fast pace on level ground, he feels little short of breath.  He also has some congestion at night, has been taking Robitussin, and he is able to sleep as long as he takes that otherwise he was have a lot of coughing.  Medications changes since his last visit, amiodarone and Imdur were discontinued.  PHYSICAL EXAMINATION:  GENERAL:  The patient is a well-appearing 55 year old gentleman in no acute distress. VITAL SIGNS:  Blood pressure is 132/84, pulse 59, respirations 20, oxygen saturation is 98% on room air. LUNGS:  Clear with essentially equal breath sounds bilaterally.  There is good aeration.  There is no crackles or wheezes. CARDIAC:  Has regular rate and rhythm.  Normal S1 and  S2.  There is no rub.  Sternal incision is clean, dry, and intact.  Sternum is stable. His radial incision is healing well.  He has trace peripheral edema.  He did not have a chest x-ray today.  He did have an x-ray on September 19, 2010, which showed some small residual bilateral effusions, but much improved from his film prior to leaving the hospital.  IMPRESSION:  The patient is a 55 year old gentleman.  He is status post coronary bypass grafting, redo bypass grafting using two arterial conduits.  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  I encouraged him to continue to increase his exercise, but did only gradual basis and not to over do.  He is still not to lift anything greater than 10 pounds for another 2 weeks.  I will plan to see him back in 2 weeks and check a chest x-ray, if all the symptoms have resolved by that time, he will call and cancel, but I just want to make sure that  he is continued to progress, is not having any of recurrence of his pleural effusion.  Salvatore Decent [name], M.D. Electronically Signed  SCH/MEDQ  D:  09/28/2010  T:  09/29/2010  Job:  [id number]  cc:   Thomas C. Daleen Squibb, MD, [facility]. Ouida Sills, MD

## 2010-10-02 ENCOUNTER — Telehealth: Payer: Self-pay | Admitting: Cardiology

## 2010-10-02 ENCOUNTER — Other Ambulatory Visit: Payer: Self-pay | Admitting: Cardiovascular Disease

## 2010-10-02 ENCOUNTER — Ambulatory Visit (HOSPITAL_COMMUNITY): Payer: BC Managed Care – PPO | Attending: Cardiovascular Disease

## 2010-10-02 ENCOUNTER — Ambulatory Visit (INDEPENDENT_AMBULATORY_CARE_PROVIDER_SITE_OTHER)
Admission: RE | Admit: 2010-10-02 | Discharge: 2010-10-02 | Disposition: A | Discharge status: Home / Self Care | Payer: BC Managed Care – PPO | Source: Ambulatory Visit | Attending: Cardiovascular Disease | Admitting: Cardiovascular Disease

## 2010-10-02 ENCOUNTER — Ambulatory Visit (INDEPENDENT_AMBULATORY_CARE_PROVIDER_SITE_OTHER): Payer: BC Managed Care – PPO | Admitting: Cardiovascular Disease

## 2010-10-02 ENCOUNTER — Encounter: Payer: Self-pay | Admitting: Cardiovascular Disease

## 2010-10-02 DIAGNOSIS — I4891 Unspecified atrial fibrillation: Secondary | ICD-10-CM | POA: Insufficient documentation

## 2010-10-02 DIAGNOSIS — R0602 Shortness of breath: Secondary | ICD-10-CM

## 2010-10-02 DIAGNOSIS — I2510 Atherosclerotic heart disease of native coronary artery without angina pectoris: Secondary | ICD-10-CM | POA: Insufficient documentation

## 2010-10-02 DIAGNOSIS — R0609 Other forms of dyspnea: Secondary | ICD-10-CM

## 2010-10-02 DIAGNOSIS — Z8249 Family history of ischemic heart disease and other diseases of the circulatory system: Secondary | ICD-10-CM | POA: Insufficient documentation

## 2010-10-02 DIAGNOSIS — R079 Chest pain, unspecified: Secondary | ICD-10-CM | POA: Insufficient documentation

## 2010-10-02 DIAGNOSIS — R0989 Other specified symptoms and signs involving the circulatory and respiratory systems: Secondary | ICD-10-CM

## 2010-10-02 DIAGNOSIS — I10 Essential (primary) hypertension: Secondary | ICD-10-CM | POA: Insufficient documentation

## 2010-10-02 LAB — BASIC METABOLIC PANEL
CO2: 29 mEq/L (ref 19–32)
Calcium: 9.6 mg/dL (ref 8.4–10.5)
Chloride: 103 mEq/L (ref 96–112)
Creatinine, Ser: 1.2 mg/dL (ref 0.4–1.5)
GFR: 64.61 mL/min (ref >60.00)
Glucose, Bld: 87 mg/dL (ref 70–99)
Potassium: 6.1 mEq/L (ref 3.5–5.1)
Sodium: 140 mEq/L (ref 135–145)

## 2010-10-02 LAB — BRAIN NATRIURETIC PEPTIDE: Pro B Natriuretic peptide (BNP): 220.7 pg/mL — ABNORMAL HIGH (ref 0.0–100.0)

## 2010-10-03 ENCOUNTER — Encounter: Payer: Self-pay | Admitting: Cardiovascular Disease

## 2010-10-03 ENCOUNTER — Other Ambulatory Visit: Payer: Self-pay | Admitting: Cardiovascular Disease

## 2010-10-03 ENCOUNTER — Ambulatory Visit: Payer: BC Managed Care – PPO | Admitting: Physician Assistant

## 2010-10-03 ENCOUNTER — Other Ambulatory Visit (INDEPENDENT_AMBULATORY_CARE_PROVIDER_SITE_OTHER): Payer: BC Managed Care – PPO

## 2010-10-03 DIAGNOSIS — R0602 Shortness of breath: Secondary | ICD-10-CM

## 2010-10-03 LAB — BASIC METABOLIC PANEL
BUN: 17 mg/dL (ref 6–23)
Calcium: 9.1 mg/dL (ref 8.4–10.5)
Creatinine, Ser: 1.3 mg/dL (ref 0.4–1.5)
GFR: 61.73 mL/min (ref >60.00)

## 2010-10-04 ENCOUNTER — Ambulatory Visit
Admission: RE | Admit: 2010-10-04 | Discharge: 2010-10-04 | Disposition: A | Discharge status: Home / Self Care | Payer: BC Managed Care – PPO | Source: Ambulatory Visit | Attending: Thoracic Surgery (Cardiothoracic Vascular Surgery) | Admitting: Thoracic Surgery (Cardiothoracic Vascular Surgery)

## 2010-10-04 ENCOUNTER — Encounter (INDEPENDENT_AMBULATORY_CARE_PROVIDER_SITE_OTHER): Payer: Self-pay | Admitting: Thoracic Surgery (Cardiothoracic Vascular Surgery)

## 2010-10-04 DIAGNOSIS — I2510 Atherosclerotic heart disease of native coronary artery without angina pectoris: Secondary | ICD-10-CM

## 2010-10-04 DIAGNOSIS — Z9889 Other specified postprocedural states: Secondary | ICD-10-CM

## 2010-10-04 DIAGNOSIS — J90 Pleural effusion, not elsewhere classified: Secondary | ICD-10-CM

## 2010-10-04 NOTE — Progress Notes (Signed)
Summary: [facility]: Office Visit  [facility]: Office Visit   Imported By: Earl Many 09/26/2010 17:09:39  _____________________________________________________________________  External Attachment:    Type:   Image     Comment:   External Document

## 2010-10-05 NOTE — Assessment & Plan Note (Signed)
OFFICE VISIT  Jeremy, Hendricks DOB:  [birth year]                                        October 04, 2010 CHART #:  [id number]  The patient is a 55 year old gentleman who had redo coronary bypass grafting on August 30, 2010.  Postoperatively, he developed a left pleural effusion and we had to do a thoracentesis.  He also had some atrial fibrillation.  He saw Dr. Clifton James at the cardiologist's office yesterday.  He was complaining of feeling tired and rundown.  He has been back at work and been very active and recovered very quickly, but he was starting to feel progressively worse.  He was starting to have a cough.  Dr. Clifton James noted decreased breath sounds on the right side.  A chest x-ray showed a moderate right pleural effusion and he now comes here for a thoracentesis.  He has not had any anginal-type chest pain, just says he has a lack of energy, some shortness of breath, coughing, particularly when he tries to lie flat.  CURRENT MEDICATIONS: 1. Crestor 20 mg daily. 2. Atenolol 25 mg daily. 3. Aspirin 325 mg daily. 4. Multivitamin once daily. 5. Ambien at bedtime p.r.n.  ALLERGIES:  He has no known drug allergies.  PHYSICAL EXAMINATION:  His blood pressure is 112/74, pulse 68, respirations are 16, ox saturation is 90% on room air.  He is a well- appearing 55 year old male in no acute distress, has markedly diminished breath sounds with dullness to percussion at the right base.  IMAGING:  Chest x-ray reviewed.  There is a moderate right pleural effusion.  IMPRESSION:  Right pleural effusion after redo bypass grafting likely related to mammary harvest of the right mammary.  Thoracentesis is indicated.  I discussed with him the risks and benefits.  He agrees to proceed and gave informed consent.  PROCEDURE NOTE:  Using sterile technique and 1% lidocaine local anesthesia, the patient was placed in the sitting position.  The right posterior chest  was prepped and draped.  Lidocaine 1% was used to achieve local anesthetic.  After achieving adequate local anesthetic effect, thoracentesis was performed, needle passed easily into the chest.  There was clear yellow fluid, 1900 mL were evacuated.  The patient tolerated it well with some minimal coughing at the end of the procedure.  PLAN:  The patient will go for a chest x-ray post thoracentesis.  I am going to give him a prednisone taper and plan to see him back in 3 weeks with a repeat chest x-ray to check on his progress.  If he has any problems with his breathing in the meantime, he will give [place] a call.  Salvatore Decent [name], M.D. Electronically Signed  SCH/MEDQ  D:  10/04/2010  T:  10/05/2010  Job:  [id number]  cc:   Thomas C. Daleen Squibb, MD, [facility]. Ouida Sills, MD

## 2010-10-10 NOTE — Assessment & Plan Note (Signed)
Summary: PT C/O DIFFICULTY BREATHING WITH LYING DOWN S/P REDO CABG   Visit Type:  Follow-up Primary Provider:  Osborne Casco  CC:  lungs filling last few days. very sob.Marland Kitchen  History of Present Illness: Jeremy Hendricks is a 55 yo male with a h/o CAD, s/p CABG in 2002.    He saw Dr. Daleen Squibb in January 2012 with chest discomfort and was noted to have an abnormal Myoview.  He was referred for cardiac catheterization.  This demonstrated severe graft disease and native three-vessel CAD.  He was referred for redo bypass.  His LIMA toLAD remained patent.  His grafts at his surgery on 09/01/10 with Dr. [name] included a left radial-PDA and a free RIMA-ramus intermedius.  Postoperatively he developed atrial fibrillation with rapid ventricular rate and was placed on amiodarone.  He did suffer from blood loss anemia and underwent transfusion with packed red blood cells.  He also developed a left pleural effusion that was treated with thoracentesis.  He was seen by Tereso Newcomer, PA two weeks ago for hospital follow up. He was doing well then. He was seen by Dr. [name] on 09/28/10 and was doing well. Plans were made for repeat CXR for the beginning of March.   He is added onto my schedule today for evaluation of SOB. He tells me that over the last three days he has increased SOB. He describes SOB when laying on his back. As above, post-op with pleural effusions. Most recent CXR 09/19/10 with small bilateral pleural effusions. He has been taking shallow breaths and says "I feel like only a part of my lung is working".  He has been "panting" with minimal exertion. He denies near syncope or syncope. No chest pain. NO lower ext edema.   Current Medications (verified): 1)  Aspirin Ec 325 Mg Tbec (Aspirin) .... Take One Tablet By Mouth Daily 2)  Atenolol 25 Mg Tabs (Atenolol) .... Take 1 Tab Daily 3)  Daily Multi  Tabs (Multiple Vitamins-Minerals) .... Take 1 Tab Daily 4)  Crestor 20 Mg Tabs (Rosuvastatin Calcium) ....  Take One Tablet By Mouth Daily. 5)  Nitrostat 0.4 Mg Subl (Nitroglycerin) .Marland Kitchen.. 1 Tablet Under Tongue At Onset of Chest Pain; You May Repeat Every 5 Minutes For Up To 3 Doses. 6)  Ambien 10 Mg Tabs (Zolpidem Tartrate) .... 1/2 Tab At Bedtime, As Needed  Allergies (verified): No Known Drug Allergies  Past History:  Past Medical History: Reviewed history from 09/19/2010 and no changes required. CAD (ICD-414.00)   a.  s/p CABG in 2002   b.  s/p redo CABG January 2012: L-LAD remained from original CABG; new grafts incl L radial-PDA + RIMA-RI   c. EF 60% at cardiac catheterization 08/24/10 Postoperative atrial fibrillation   a.  Amiodarone therapy HYPERLIPIDEMIA (ICD-272.4) DEGENERATIVE JOINT DISEASE (ICD-715.90)  Past Surgical History: coronary artery bypasss graft times 3 at age 17, repeat January 2012.  Family History: Reviewed history from 08/23/2010 and no changes required. Father:had myocardial infarction at age 3 Family History of Cancer:  Family History of Coronary Artery Disease:  Family History of Hyperlipidemia:   Social History: Reviewed history from 08/23/2010 and no changes required. Tobacco Use - Former. 2001 Alcohol Use - no Regular Exercise - yes Drug Use - no Full Time -- assist principal at [employer] Divorced   Review of Systems       The patient complains of shortness of breath.  The patient denies fatigue, malaise, fever, weight gain/loss, vision loss, decreased hearing, hoarseness, chest  pain, palpitations, prolonged cough, wheezing, sleep apnea, coughing up blood, abdominal pain, blood in stool, nausea, vomiting, diarrhea, heartburn, incontinence, blood in urine, muscle weakness, joint pain, leg swelling, rash, skin lesions, headache, fainting, dizziness, depression, anxiety, enlarged lymph nodes, easy bruising or bleeding, and environmental allergies.    Vital Signs:  Patient profile:   55 year old male Height:      68 inches Weight:      183  pounds BMI:     27.93 O2 Sat:      98 % on Room air Pulse rate:   66 / minute Resp:     19 per minute BP sitting:   138 / 90  (left arm)  Vitals Entered By: Celestia Khat, CMA (October 02, 2010 10:34 AM)  O2 Flow:  Room air  Physical Exam  General:  General: Well developed, well nourished, NAD HEENT: OP clear, mucus membranes moist SKIN: warm, dry Neuro: No focal deficits Musculoskeletal: Muscle strength 5/5 all ext Psychiatric: Mood and affect normal Neck: No JVD, no carotid bruits, no thyromegaly, no lymphadenopathy. Lungs:Decreased breath sounds bilateral bases, no wheezes, rhonci, crackles CV: RRR no murmurs, gallops rubs Abdomen: soft, NT, ND, BS present Extremities: No edema, pulses 2+.    EKG  Procedure date:  10/02/2010  Findings:      sinus brady, rate 66 bpm. Low voltage.   Impression & Recommendations:  Problem # 1:  SHORTNESS OF BREATH (ICD-786.05) Differential includes CHF, enlarging pleural effusions, PE. Will check d-dimer, BNP, Echo to assess LV function post bypass and exclude pericardial effusion, repeat CXR, check BMET. He will be started on Lasix 40 mg by mouth Qdaily.  Follow up one week with Dr. Daleen Squibb.   His updated medication list for this problem includes:    Aspirin Ec 325 Mg Tbec (Aspirin) .Marland Kitchen... Take one tablet by mouth daily    Atenolol 25 Mg Tabs (Atenolol) .Marland Kitchen... Take 1 tab daily    Lasix 40 Mg Tabs (Furosemide) .Marland Kitchen... Take one tablet by mouth daily  Orders: EKG w/ Interpretation (93000) Echocardiogram (Echo) TLB-BNP (B-Natriuretic Peptide) (83880-BNPR) TLB-BMP (Basic Metabolic Panel-BMET) (80048-METABOL) T-D-Dimer Fibrin Derivatives Quantitive (29562-13086) T-2 View CXR (71020TC)  Patient Instructions: 1)  Your physician recommends that you schedule a follow-up appointment in: 1 week with Dr. Daleen Squibb 2)  Your physician recommends that you have lab work today. 3)  Your physician has recommended you make the following change in your  medication: START LASIX 40mg  by mouth daily. 4)  A chest x-ray takes a picture of the organs and structures inside the chest, including the heart, lungs, and blood vessels. This test can show several things, including, whether the heart is enlarged; whether fluid is building up in the lungs; and whether pacemaker / defibrillator leads are still in place. 5)  Your physician has requested that you have an echocardiogram.  Echocardiography is a painless test that uses sound waves to create images of your heart. It provides your doctor with information about the size and shape of your heart and how well your heart's chambers and valves are working.  This procedure takes approximately one hour. There are no restrictions for this procedure. Prescriptions: LASIX 40 MG TABS (FUROSEMIDE) take one tablet by mouth daily  #30 x 3   Entered by:   Whitney Maeola Sarah RN   Authorized by:   Verne Carrow, MD   Signed by:   Ellender Hose RN on 10/02/2010   Method used:   Electronically to  [facility]  [address]  [phone]* (retail)       [address]       [place], [place]  [address]       Ph: [phone] or [phone]       Fax: [phone]   RxID:   [id number]   Appended Document: PT C/O DIFFICULTY BREATHING WITH LYING DOWN S/P REDO CABG  Reviewed Juanito Doom, MD

## 2010-10-10 NOTE — Progress Notes (Signed)
Summary: c/o sob, lung filling up - wants appt today if possible  Phone Note Call from Patient Call back at Home Phone [phone]   Caller: Patient Reason for Call: Talk to Nurse Summary of Call: c/o lung filling up. hard to sleep. pt has appt tomorrow. sob. pt wants to know can he come in today if possible.  Initial call taken by: Lorne Skeens,  October 02, 2010 8:07 AM  Follow-up for Phone Call        Phone Call Completed SPOKE WITH PT C/O DIFFICULTY BREATHING ESPECIALLY AT NIGHT  FEELS LIKE HE IS "GURGLING" WHEN LYING DOWN  X 3 DAYS REQUESTING APPT TODAY  HAS APPT WITH [facility] SPOKE WITH WHITNEY DR MCALHANY'S NURSE PT TO COME IN TODAY AT 10:15 AM.PT AWARE. Follow-up by: Scherrie Bateman, LPN,  October 02, 2010 8:34 AM     Appended Document: c/o sob, lung filling up - wants appt today if possible  Reviewed Juanito Doom, MD

## 2010-10-11 ENCOUNTER — Encounter: Payer: BC Managed Care – PPO | Admitting: Thoracic Surgery (Cardiothoracic Vascular Surgery)

## 2010-10-12 ENCOUNTER — Ambulatory Visit: Payer: BC Managed Care – PPO | Admitting: Cardiovascular Disease

## 2010-10-24 NOTE — Letter (Signed)
Summary: [facility] Office Visit Note   [facility] Office Visit Note   Imported By: Roderic Ovens 10/17/2010 14:51:09  _____________________________________________________________________  External Attachment:    Type:   Image     Comment:   External Document

## 2010-10-25 ENCOUNTER — Ambulatory Visit
Admission: RE | Admit: 2010-10-25 | Discharge: 2010-10-25 | Disposition: A | Discharge status: Home / Self Care | Payer: BC Managed Care – PPO | Source: Ambulatory Visit | Attending: Thoracic Surgery (Cardiothoracic Vascular Surgery) | Admitting: Thoracic Surgery (Cardiothoracic Vascular Surgery)

## 2010-10-25 ENCOUNTER — Encounter (INDEPENDENT_AMBULATORY_CARE_PROVIDER_SITE_OTHER): Payer: Self-pay | Admitting: Thoracic Surgery (Cardiothoracic Vascular Surgery)

## 2010-10-25 ENCOUNTER — Other Ambulatory Visit: Payer: Self-pay | Admitting: Thoracic Surgery (Cardiothoracic Vascular Surgery)

## 2010-10-25 DIAGNOSIS — J90 Pleural effusion, not elsewhere classified: Secondary | ICD-10-CM

## 2010-10-25 DIAGNOSIS — I2510 Atherosclerotic heart disease of native coronary artery without angina pectoris: Secondary | ICD-10-CM

## 2010-10-26 NOTE — Assessment & Plan Note (Signed)
OFFICE VISIT  Jeremy Hendricks, GILLIAN DOB:  Sep 24, 1955                                        October 25, 2010 CHART #:  [id number]  HISTORY OF PRESENT ILLNESS:  The patient is a 55 year old gentleman who had redo bypass grafting on August 30, 2010.  He initially had a left pleural effusion which required thoracentesis and then in followup in the office.  He had a right pleural effusion which required thoracentesis.  He was last seen in the office on October 04, 2010, at which time he had a moderate right pleural effusion.  Thoracentesis was performed and 1900 mL of fluid was evacuated.  He had a small amount of residual fluid which appeared possibly loculated on his post thoracentesis film.  He now returns for followup.  He says that he is disappointed with this progress.  He has been working at it and he has been unable to jog.  He can walk.  He does not intense walk and gets tired and short of breath at the completion.  He really has not been able jog.  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  He had lost about 20 pounds.  He has not had any anginal type discomfort with exertion.  He also noted that he feels a little dizzy.  He felt like his blood pressure drops if he stands up suddenly.  MEDICATIONS:  His current medications are Crestor 20 mg daily, atenolol 25 mg daily, aspirin 325 mg daily and multivitamin one daily, Lasix 20 mg daily, Mucinex 600 mg p.o. nightly which he feels asleep without taking that last night and Ambien 10 mg p.o. nightly.  ALLERGIES:  He has no known drug allergies.  PHYSICAL EXAMINATION:  Lungs:  Clear to auscultation and percussion. Cardiac:  Regular rate and rhythm.  Normal S1 and S2.  No murmurs, rubs, or gallops.  LABORATORY DATA:  His chest x-ray today shows further improvement  with residual right pleural effusion.  There is still a tiny effusion present but not of clinical significance.  IMPRESSION:  The patient is a 55 year old gentleman.  He is about 2 months out from redo bypass grafting.  He still well ahead of the average patient.  In fact, I do not if had any patients who has a better exercise tolerance than he does at this point after redo bypass grafting.  I am not really concerned that he still gets some shortness of breath with exertion and still is lacking some energy.  I think he is really being a little unrealistic in his expectations of himself in terms of the recovery.  A lot of that probably has to do with his rapid recovery after his first operation.  I do not really see any causes for concern.  I think over the next 2-3 months, he will continue to show improvement.  His pleural effusion has now resolved.  I recommended that he stop the Lasix but to follow his weight particularly looking for a weight gain of 3 pounds in a day or 5 pounds in 3 days which could indicate volume retention, and then I also recommended that he cut his atenolol in-half and  he has been taking 25 mg daily.  I recommended that he take half a tablet and see if that makes any difference.  I talked about parameters for his heart rate with exercise, up in the 120s is fine and certainly I do not want it going above 140, but he will keep an eye on that and see if that has any impact on, how he feels his stamina.  At this point from a surgical standpoint, he is doing quite well and I do not think there are any surgical issues.  He will continue to follow up with Dr. Ouida Sills and Dr. Daleen Squibb, I would be happy to see him back at anytime, if I can be of any further assistance for his care.  Salvatore Decent [name], M.D. Electronically Signed  SCH/MEDQ  D:  10/25/2010  T:  10/26/2010  Job:  [id number]  cc:   Thomas C. Daleen Squibb, MD, [facility]. Ouida Sills, MD

## 2010-11-01 ENCOUNTER — Telehealth: Payer: Self-pay | Admitting: Cardiology

## 2010-11-01 NOTE — Telephone Encounter (Signed)
PT IS CALLING BECAUSE HE CONTINUES TO HAVE FLUID COME BACK IN HIS LUNGS HE HAS BEEN TO SEE MCALHANY AND HIS PCP THAT TOOK THE FLUID OFF A FEW WEEKS AGO. HE WANTS TO KNOW IF THERE IS A REASON THIS KEEPS HAPPENING OR IF THERE IS A MEDICATION HE CAN TAKE TO HELP THIS INSTEAD OF COMING TO ANOTHER VISIT.

## 2010-11-28 ENCOUNTER — Telehealth: Payer: Self-pay | Admitting: Cardiology

## 2010-11-28 ENCOUNTER — Telehealth: Payer: Self-pay | Admitting: Cardiovascular Disease

## 2010-11-28 NOTE — Telephone Encounter (Signed)
Left pt. A message to call back. 

## 2010-11-30 ENCOUNTER — Telehealth: Payer: Self-pay | Admitting: *Deleted

## 2010-11-30 MED ORDER — FUROSEMIDE 40 MG PO TABS: 40.0000 mg | ORAL_TABLET | Freq: Two times a day (BID) | ORAL | Status: DC

## 2010-11-30 NOTE — Telephone Encounter (Signed)
Pt calls today with increasing fluid in his lungs for the past couple of weeks.  He had been doing well and lasix was discontinued in March when he last saw Dr. [name]. He restarted his lasix 40mg  daily a couple of weeks ago.  This seemed to help according to pt. But, he was still having what he felt like more fluid build-up at night.  He then increased his lasix to 40mg  bid about 1 week ago.  He feels "85% better with the increased lasix".  His weight is now just under 180 and he is breathing better.  He wants to know does he have to stay on this "forever". He is aware will discuss with DOD as Dr. Daleen Squibb is not in the office today. He will also need a refill on lasix if he is to continue. Mylo Red RN

## 2010-11-30 NOTE — Telephone Encounter (Signed)
After DOD reviewed, I  spoke with pt about having lab work Financial trader) at  [employer], and chest xray tomorrow morning (apmh)  He will have these done before going to school.  He is feeling better at this time.  He will also continue furosemide 40mg  bid and eat a potassium rich diet. Mylo Red RN

## 2010-12-01 ENCOUNTER — Ambulatory Visit (HOSPITAL_COMMUNITY)
Admission: RE | Admit: 2010-12-01 | Discharge: 2010-12-01 | Disposition: A | Discharge status: Home / Self Care | Payer: BC Managed Care – PPO | Source: Ambulatory Visit | Attending: Cardiology | Admitting: Cardiology

## 2010-12-01 ENCOUNTER — Other Ambulatory Visit: Payer: Self-pay | Admitting: Cardiology

## 2010-12-01 DIAGNOSIS — Z951 Presence of aortocoronary bypass graft: Secondary | ICD-10-CM | POA: Insufficient documentation

## 2010-12-01 DIAGNOSIS — R0602 Shortness of breath: Secondary | ICD-10-CM

## 2010-12-01 LAB — BASIC METABOLIC PANEL
Calcium: 10.4 mg/dL (ref 8.4–10.5)
Sodium: 139 mEq/L (ref 135–145)

## 2010-12-04 NOTE — Telephone Encounter (Signed)
No need to continue Lasix 

## 2010-12-04 NOTE — Telephone Encounter (Signed)
No need to continue Lasix

## 2010-12-05 ENCOUNTER — Encounter: Payer: Self-pay | Admitting: Cardiology

## 2011-02-05 ENCOUNTER — Encounter: Payer: Self-pay | Admitting: Cardiology

## 2011-05-11 ENCOUNTER — Encounter: Payer: Self-pay | Admitting: Cardiovascular Disease

## 2011-05-14 ENCOUNTER — Encounter: Payer: Self-pay | Admitting: Cardiovascular Disease

## 2011-05-14 ENCOUNTER — Ambulatory Visit (INDEPENDENT_AMBULATORY_CARE_PROVIDER_SITE_OTHER): Payer: BC Managed Care – PPO | Admitting: Cardiovascular Disease

## 2011-05-14 VITALS — BP 128/76 | HR 51 | Ht 68.0 in | Wt 188.8 lb

## 2011-05-14 DIAGNOSIS — I25810 Atherosclerosis of coronary artery bypass graft(s) without angina pectoris: Secondary | ICD-10-CM

## 2011-05-14 DIAGNOSIS — I2510 Atherosclerotic heart disease of native coronary artery without angina pectoris: Secondary | ICD-10-CM

## 2011-05-14 NOTE — Assessment & Plan Note (Signed)
Stable. Continue current meds. Will plan treadmill stress only in 6 months. He is worried about how he can know if his disease is progressing. This will be done for screening. No changes today. He wishes to continue coming to my clinic.

## 2011-05-14 NOTE — Progress Notes (Signed)
History of Present Illness:Jeremy Hendricks is a 55 yo male former  pt of Dr. Daleen Squibb with a h/o CAD, s/p CABG in 2002 and redo in January 2012.    He saw Dr. Daleen Squibb in January 2012 with chest discomfort and was noted to have an abnormal Myoview.  He was referred for cardiac catheterization by Dr. Antoine Poche.  This demonstrated severe graft disease and native three-vessel CAD.  He was referred for redo bypass.  His LIMA to LAD remained patent.  His grafts at his surgery on 09/01/10 with Dr. [name] included a left radial-PDA and a free RIMA-ramus intermedius.  Postoperatively he developed atrial fibrillation with rapid ventricular rate and was placed on amiodarone.  He did suffer from blood loss anemia and underwent transfusion with packed red blood cells.  He also developed a left pleural effusion that was treated with thoracentesis.  I saw him as an add on to my schedule February 2012 for evaluation of dyspnea. Echo February 2012 with normal LVEF, 60%. BNP was normal.   He is here today for follow up. He tells me that he has been doing well. No chest pain. His energy level has been good. He has been exercising every day. No SOB. He does note occasionally feeling "gurgling" in his throat when laying on his right side.     Past Medical History  Diagnosis Date  . Coronary artery disease   . Atrial fibrillation     postoperative  . DJD (degenerative joint disease)     Past Surgical History  Procedure Date  . Coronary artery bypass graft 2002  . Coronary artery bypass graft 08-2010    L-LAD remained from original CABG; new grafts incl L radial- PDA + RIMA-RI    Current Outpatient Prescriptions  Medication Sig Dispense Refill  . aspirin 325 MG tablet Take 325 mg by mouth daily.        Marland Kitchen atenolol (TENORMIN) 25 MG tablet Take 25 mg by mouth daily.        . furosemide (LASIX) 40 MG tablet Take 1 tablet (40 mg total) by mouth 2 (two) times daily.  60 tablet  6  . Multiple Vitamin (MULTIVITAMIN) tablet Take  1 tablet by mouth daily.        . nitroGLYCERIN (NITROSTAT) 0.4 MG SL tablet Place 0.4 mg under the tongue every 5 (five) minutes as needed.        . rosuvastatin (CRESTOR) 20 MG tablet Take 40 mg by mouth daily.       Marland Kitchen testosterone (ANDROGEL) 50 MG/5GM GEL Place 5 g onto the skin daily.          No Known Allergies  History   Social History  . Marital Status: Single    Spouse Name: N/A    Number of Children: N/A  . Years of Education: N/A   Occupational History  . full time-- assist principal at [employer]    Social History Main Topics  . Smoking status: Former Smoker    Quit date: 08/14/1999  . Smokeless tobacco: Not on file  . Alcohol Use: No  . Drug Use: No  . Sexually Active: Not on file   Other Topics Concern  . Not on file   Social History Narrative  . No narrative on file    Family History  Problem Relation Age of Onset  . Heart attack Father 70  . Cancer      family hx of  . Coronary artery disease  family hx of  . Hyperlipidemia      family hx of    Review of Systems:  As stated in the HPI and otherwise negative.   BP 128/76  Pulse 51  Ht 5\' 8"  (1.727 m)  Wt 188 lb 12.8 oz (85.639 kg)  BMI 28.71 kg/m2  Physical Examination: General: Well developed, well nourished, NAD HEENT: OP clear, mucus membranes moist SKIN: warm, dry. No rashes. Neuro: No focal deficits Musculoskeletal: Muscle strength 5/5 all ext Psychiatric: Mood and affect normal Neck: No JVD, no carotid bruits, no thyromegaly, no lymphadenopathy. Lungs:Clear bilaterally, no wheezes, rhonci, crackles Cardiovascular: Regular rate and rhythm. No murmurs, gallops or rubs. Abdomen:Soft. Bowel sounds present. Non-tender.  Extremities: No lower extremity edema. Pulses are 2 + in the bilateral DP/PT.  WUJ:WJXBJ bradycardia, 51 bpm. Artifact. Non-specific ST and T wave changes.

## 2011-05-14 NOTE — Patient Instructions (Signed)
Your physician has requested that you have an exercise tolerance test. For further information please visit [email or web address] Please also follow instruction sheet, as given. To be done in 6 months.

## 2011-05-21 ENCOUNTER — Encounter: Payer: Self-pay | Admitting: Cardiovascular Disease

## 2011-11-15 ENCOUNTER — Ambulatory Visit (INDEPENDENT_AMBULATORY_CARE_PROVIDER_SITE_OTHER): Payer: BC Managed Care – PPO | Admitting: Cardiovascular Disease

## 2011-11-15 ENCOUNTER — Other Ambulatory Visit: Payer: Self-pay | Admitting: *Deleted

## 2011-11-15 DIAGNOSIS — I2510 Atherosclerotic heart disease of native coronary artery without angina pectoris: Secondary | ICD-10-CM

## 2011-11-15 NOTE — Procedures (Signed)
Exercise Treadmill Test  Pre-Exercise Testing Evaluation Rhythm: sinus bradycardia  Rate: 58   PR:  .17 QRS:  .09  QT:  .44 QTc: .41     Test  Exercise Tolerance Test Ordering MD: Melene Muller, MD  Interpreting MD:  Melene Muller, MD  Unique Test No: 1  Treadmill:  1  Indication for ETT: known ASHD  Contraindication to ETT: No   Stress Modality: exercise - treadmill  Cardiac Imaging Performed: non   Protocol: standard Bruce - maximal  Max BP: 195/93  Max MPHR (bpm):  166 85% MPR (bpm):  141  MPHR obtained (bpm):  144 % MPHR obtained:  86%  Reached 85% MPHR (min:sec):  11:45 Total Exercise Time (min-sec):  11:58  Workload in METS:  13.4 Borg Scale: 15  Reason ETT Terminated:  fatigue    ST Segment Analysis At Rest: normal ST segments - no evidence of significant ST depression With Exercise: no evidence of significant ST depression  Other Information Arrhythmia:  No Angina during ETT:  absent (0) Quality of ETT:  non-diagnostic  ETT Interpretation:  normal - no evidence of ischemia by ST analysis  Comments: Pt exercised 12 for minutes. No evidence of ischemia on EKG or chest pain.   Recommendations: Continue medical management. Will stop atenolol secondary to fatigue at home.

## 2011-11-17 IMAGING — CR DG CHEST 1V PORT
1 series · 1 of 1 positions shown · non-contrast
Comparison: 08/29/2010.

CLINICAL DATA: Incorrect needle count.  The patients chest is
closed at this point.

PORTABLE CHEST - 1 VIEW

[AP]
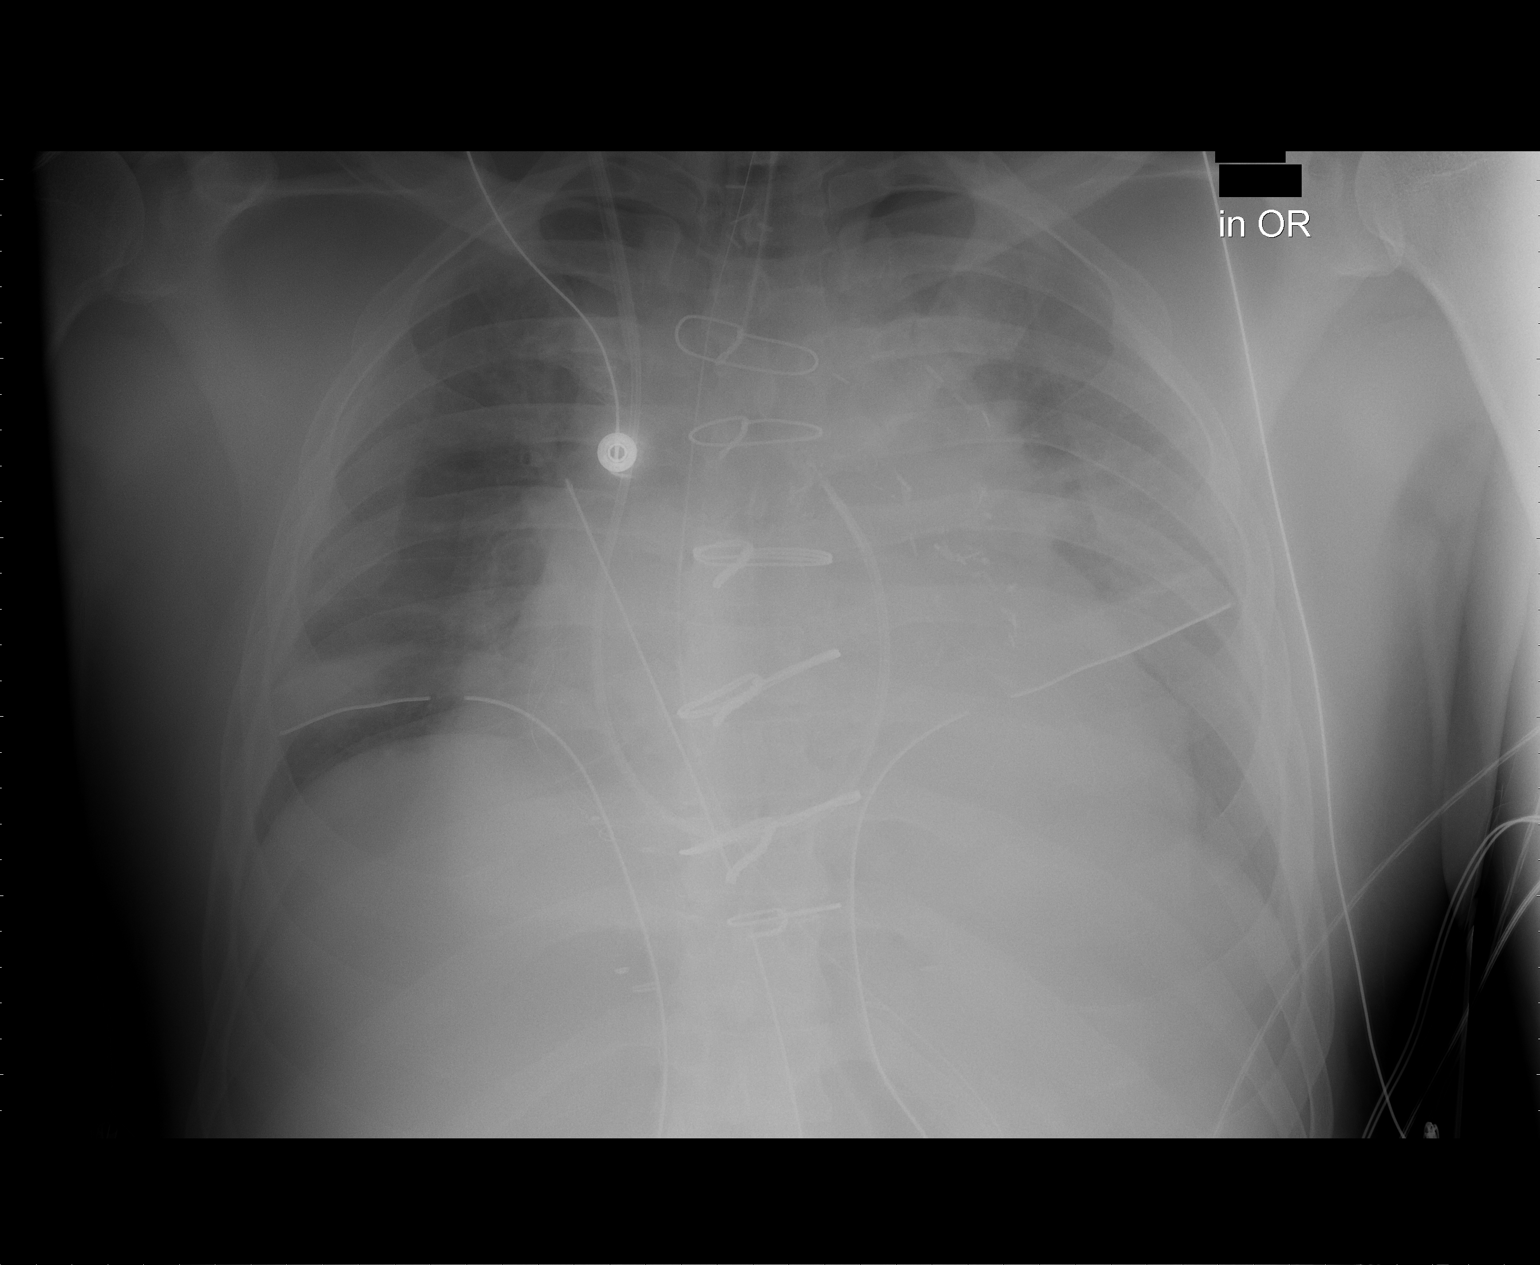

[1 of 1 positions shown; findings below may reference images not displayed]

FINDINGS: Endotracheal tube tip 2.6 cm above the carina.  Right
Swan-Ganz catheter tip main pulmonary artery.  Bilateral chest
tubes in place.  Small left apical pneumothorax.  Mediastinal
drains in place.

Cardiomegaly.  Prominence of the mediastinum.  Pulmonary vascular
congestion.

There are multiple metallic structures.  Several of these were
present previously and represent surgical clips.  Others are
difficult to adequately assess secondary to mild motion
degradation.  There is one radiopaque structure which appears new
from prior examination and slightly elongated.  I suspect this
represents a surgical clip rather than a retained needle.
Attention to this on follow-up.  Epicardial leads are in place.
IMPRESSION: No definitive retained needle is identified.  Evaluation limited by
respiratory motion.  Follow-up examination without motion
recommended.  Please see the arrow.

Cardiomegaly central pulmonary vascular prominence and mediastinal
prominence.

Small left apical pneumothorax.

Critical test results telephoned to Fabier Gelacio, RN at the time
of interpretation on 08/30/2010 at [DATE] p.m.

## 2011-11-21 ENCOUNTER — Ambulatory Visit: Payer: BC Managed Care – PPO | Admitting: Cardiovascular Disease

## 2012-01-12 IMAGING — CR DG CHEST 2V
2 series · 2 of 2 positions shown · non-contrast
Comparison: 10/04/2010

CLINICAL DATA: Pleural effusion, heart surgery, shortness of
breath.

CHEST - 2 VIEW

[w chest pa]
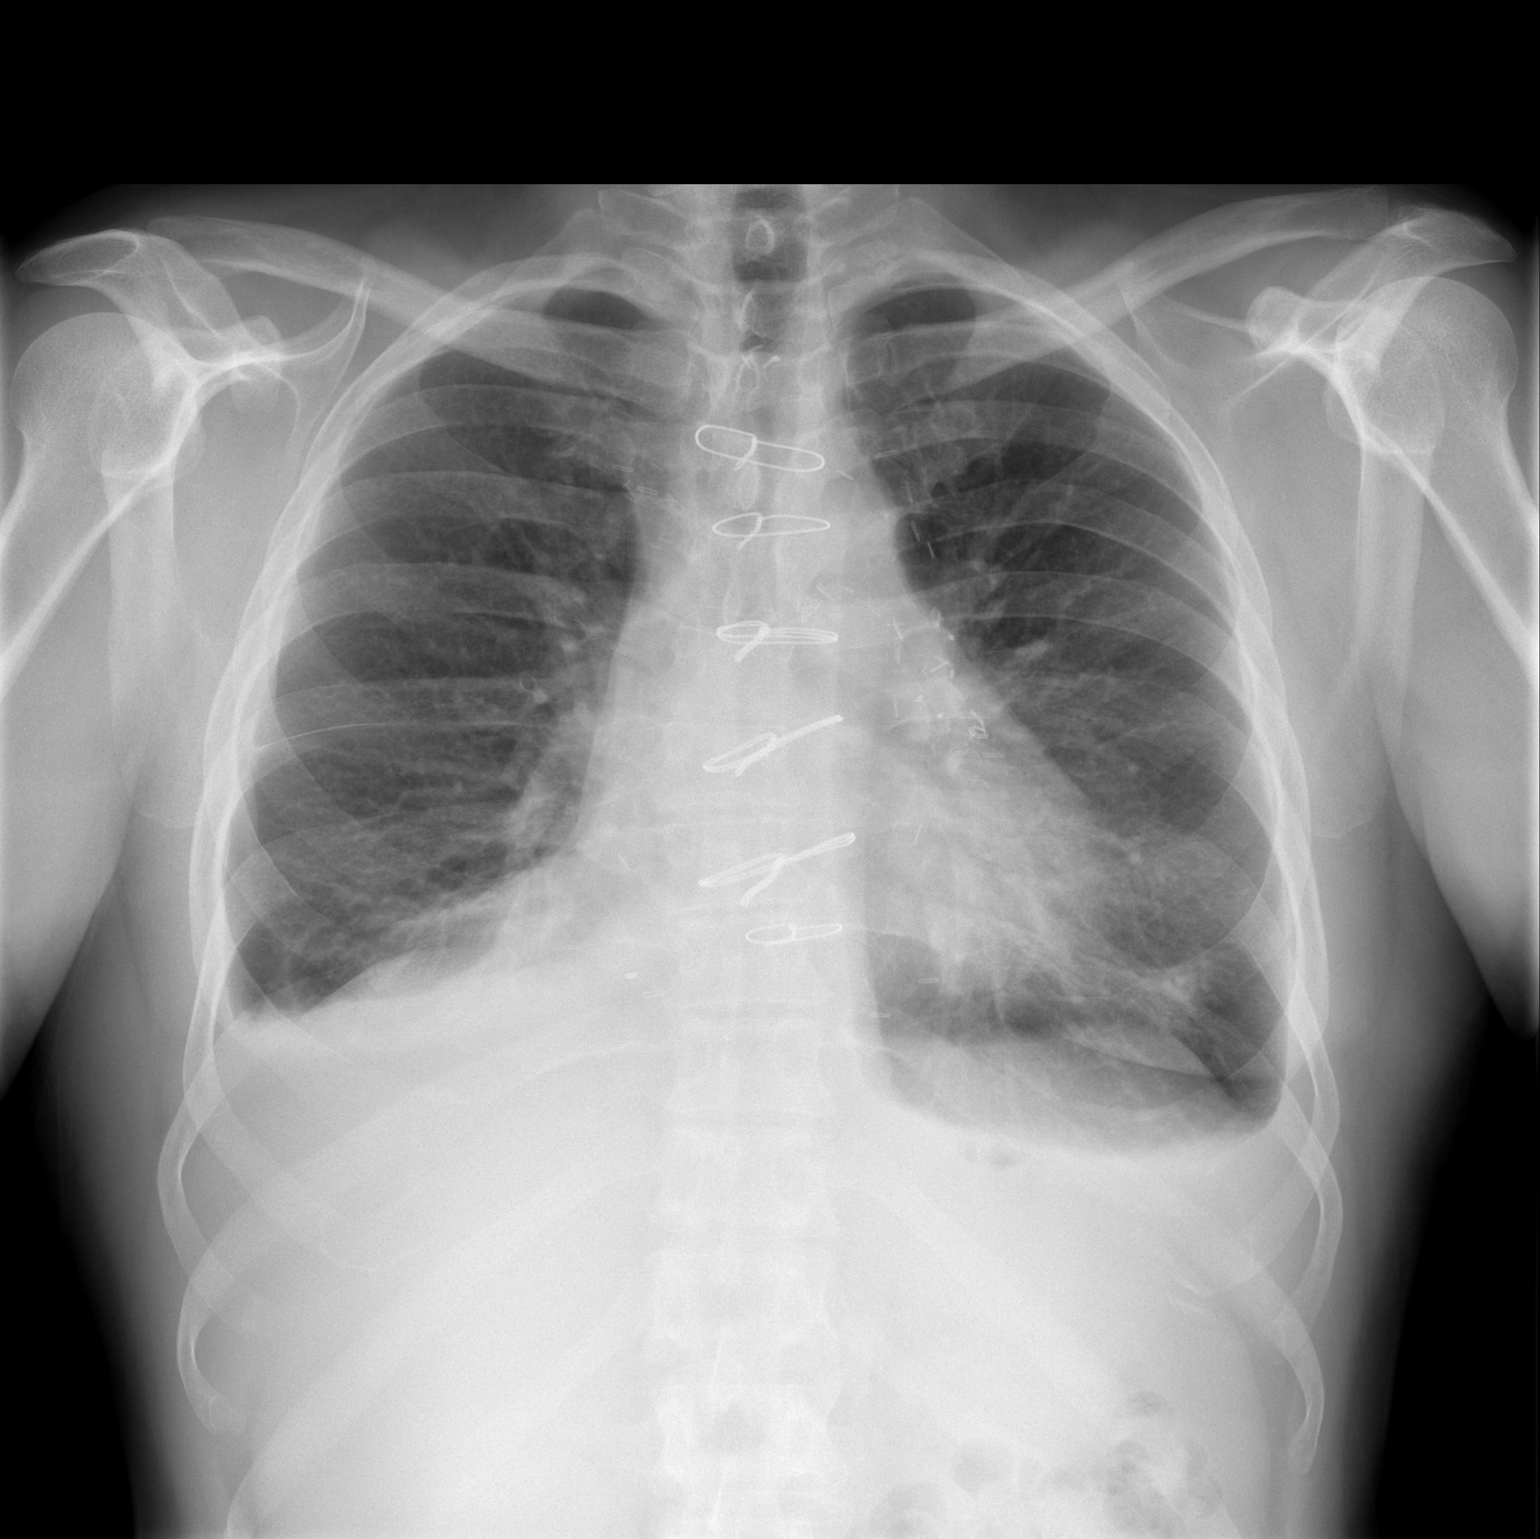

[w chest lat]
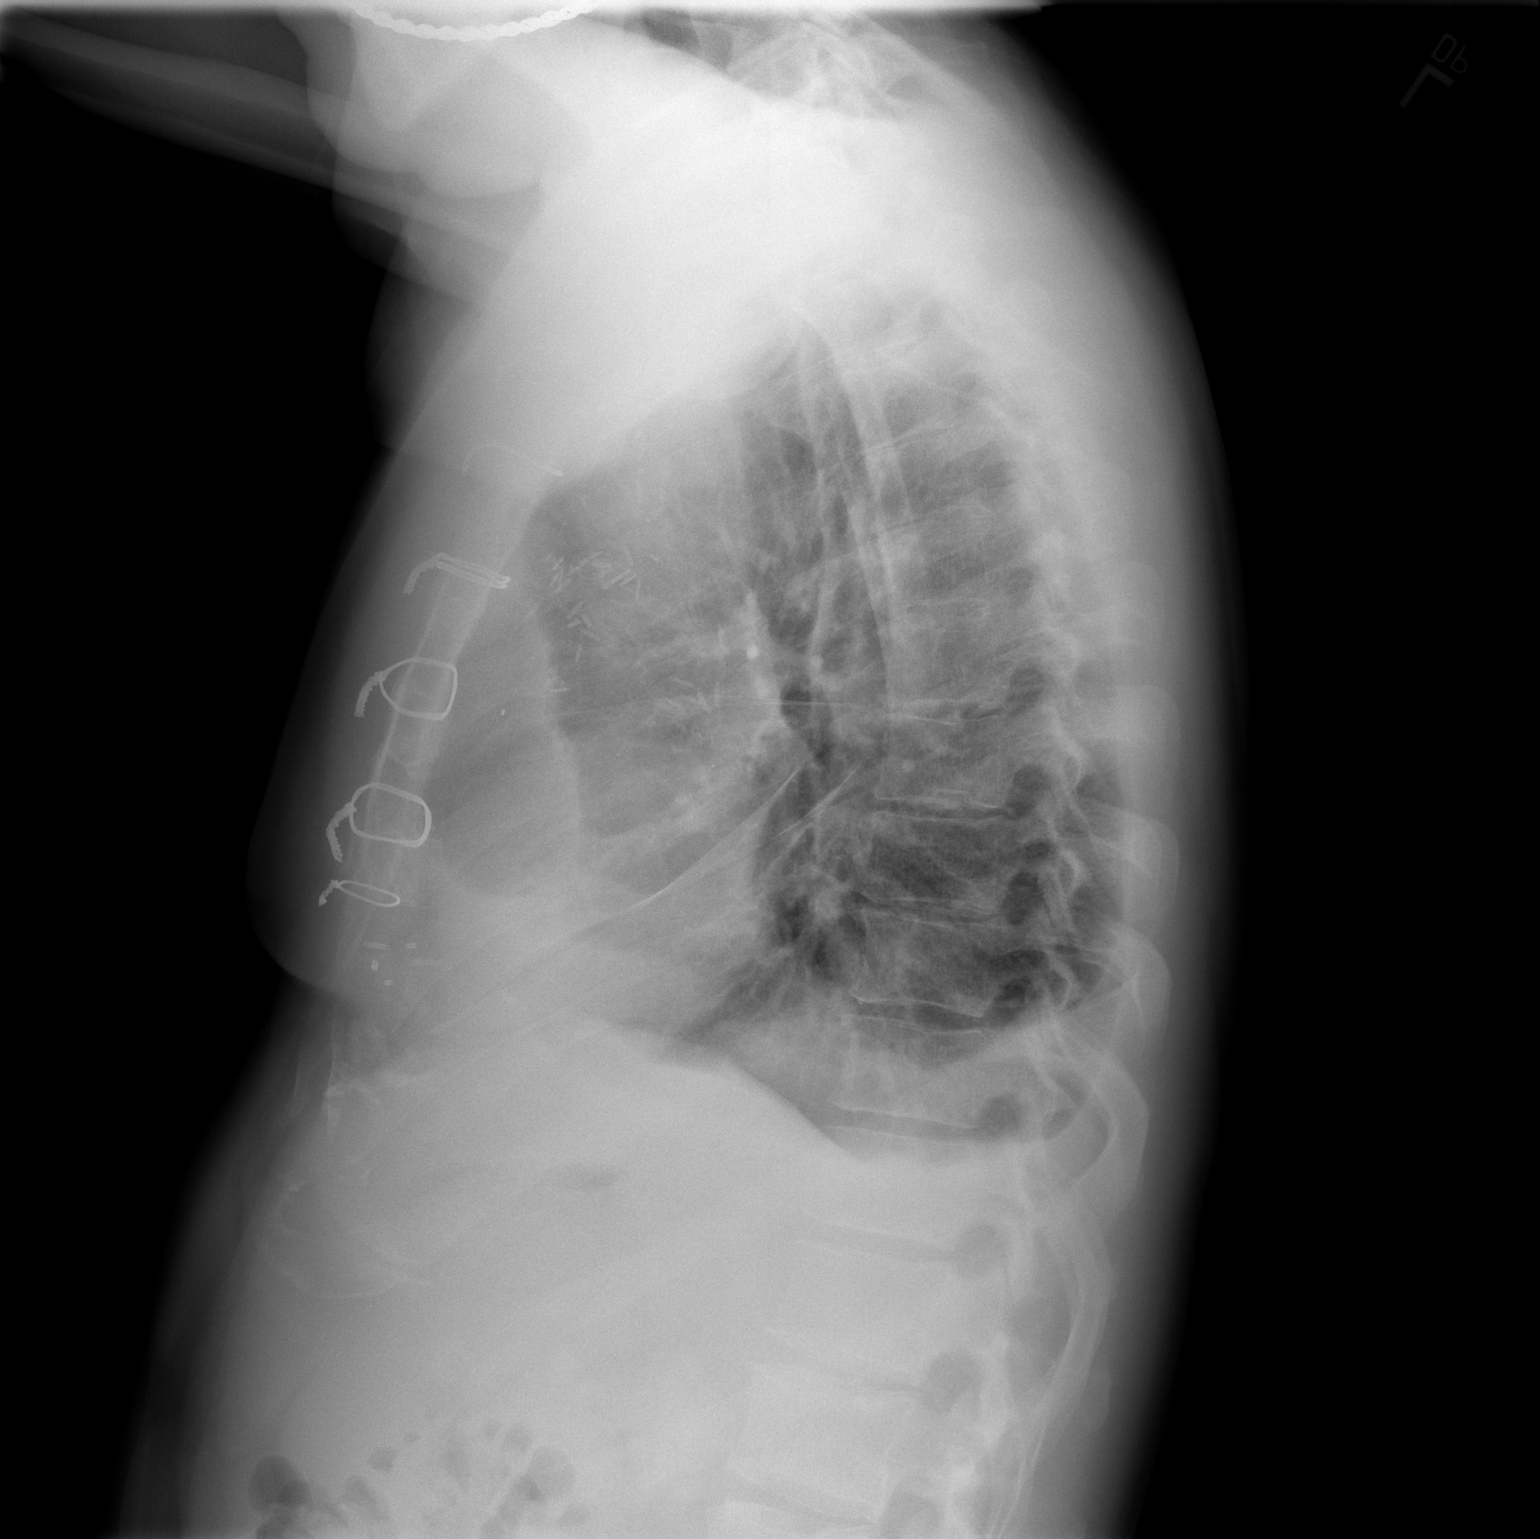

[2 of 2 positions shown; findings below may reference images not displayed]

FINDINGS: Trachea is midline.  Heart size normal.  Sternotomy
wires are unchanged in position.  There are small bilateral pleural
effusions, with decrease on the right.  Mild bibasilar atelectasis.
No pneumothorax.
IMPRESSION: Small bilateral pleural effusions and bibasilar atelectasis, with
improvement on the right.

## 2012-04-14 ENCOUNTER — Other Ambulatory Visit: Payer: Self-pay | Admitting: Cardiology

## 2012-05-15 ENCOUNTER — Encounter: Payer: Self-pay | Admitting: Cardiovascular Disease

## 2012-05-15 ENCOUNTER — Ambulatory Visit (INDEPENDENT_AMBULATORY_CARE_PROVIDER_SITE_OTHER): Payer: BC Managed Care – PPO | Admitting: Cardiovascular Disease

## 2012-05-15 VITALS — BP 131/82 | Ht 68.0 in | Wt 191.0 lb

## 2012-05-15 DIAGNOSIS — I2510 Atherosclerotic heart disease of native coronary artery without angina pectoris: Secondary | ICD-10-CM

## 2012-05-15 NOTE — Patient Instructions (Addendum)
Your physician wants you to follow-up in:  12 months.  You will receive a reminder letter in the mail two months in advance. If you don't receive a letter, please call our office to schedule the follow-up appointment.   

## 2012-05-15 NOTE — Progress Notes (Signed)
History of Present Illness: Jeremy Hendricks is a 56 yo male former pt of Dr. Daleen Squibb with a h/o CAD, s/p CABG in 2002 and redo in January 2012. He saw Dr. Daleen Squibb in January 2012 with chest discomfort and was noted to have an abnormal Myoview. He was referred for cardiac catheterization by Dr. Antoine Poche. This demonstrated severe graft disease and native three-vessel CAD. He was referred for redo bypass. His LIMA to LAD remained patent. His grafts at his surgery on 09/01/10 with Dr. [name] included a left radial-PDA and a free RIMA-ramus intermedius. Postoperatively he developed atrial fibrillation with rapid ventricular rate and was placed on amiodarone. He did suffer from blood loss anemia and underwent transfusion with packed red blood cells. He also developed a left pleural effusion that was treated with thoracentesis. I saw him as an add on to my schedule February 2012 for evaluation of dyspnea. Echo February 2012 with normal LVEF, 60%. BNP was normal. Exercise stress test April 2012 with good exercise tolerance and no ischemia.   He is here today for follow up. He tells me that he has been doing well. No chest pain. His energy level has been good. He has been exercising every day. No SOB. He is back on Lasix and this has helped him feel better. He also restarted his beta blocker.   Primary Care Physician: Jeremy Hendricks  Last Lipid Profile: Total chol: 160  HDL: 40  LDL: 95   Past Medical History  Diagnosis Date  . Coronary artery disease     s/p CABG 2002, redo 2012  . Atrial fibrillation     postoperative  . DJD (degenerative joint disease)     Past Surgical History  Procedure Date  . Coronary artery bypass graft 2002  . Coronary artery bypass graft 08-2010    L-LAD remained from original CABG; new grafts incl L radial- PDA + RIMA-RI    Current Outpatient Prescriptions  Medication Sig Dispense Refill  . aspirin 325 MG tablet Take 325 mg by mouth daily.        Marland Kitchen atenolol (TENORMIN) 25 MG  tablet Take 25 mg by mouth daily.      . furosemide (LASIX) 40 MG tablet TAKE ONE TABLET BY MOUTH TWICE DAILY  60 tablet  12  . Multiple Vitamin (MULTIVITAMIN) tablet Take 1 tablet by mouth daily.        . nitroGLYCERIN (NITROSTAT) 0.4 MG SL tablet Place 0.4 mg under the tongue every 5 (five) minutes as needed.        . rosuvastatin (CRESTOR) 20 MG tablet Take 40 mg by mouth daily.       Marland Kitchen testosterone (ANDROGEL) 50 MG/5GM GEL Place 5 g onto the skin daily.        Marland Kitchen ZETIA 10 MG tablet 1 TAB DAILY        No Known Allergies  History   Social History  . Marital Status: Single    Spouse Name: N/A    Number of Children: N/A  . Years of Education: N/A   Occupational History  . full time-- assist principal at [employer]    Social History Main Topics  . Smoking status: Former Smoker    Quit date: 08/14/1999  . Smokeless tobacco: Not on file  . Alcohol Use: No  . Drug Use: No  . Sexually Active: Not on file   Other Topics Concern  . Not on file   Social History Narrative  . No narrative on file  Family History  Problem Relation Age of Onset  . Heart attack Father 55  . Cancer      family hx of  . Coronary artery disease      family hx of  . Hyperlipidemia      family hx of    Review of Systems:  As stated in the HPI and otherwise negative.   BP 131/82  Ht 5\' 8"  (1.727 m)  Wt 191 lb (86.637 kg)  BMI 29.04 kg/m2  Physical Examination: General: Well developed, well nourished, NAD HEENT: OP clear, mucus membranes moist SKIN: warm, dry. No rashes. Neuro: No focal deficits Musculoskeletal: Muscle strength 5/5 all ext Psychiatric: Mood and affect normal Neck: No JVD, no carotid bruits, no thyromegaly, no lymphadenopathy. Lungs:Clear bilaterally, no wheezes, rhonci, crackles Cardiovascular: Regular rate and rhythm. No murmurs, gallops or rubs. Abdomen:Soft. Bowel sounds present. Non-tender.  Extremities: No lower extremity edema. Pulses are 2 + in the bilateral  DP/PT.  EKG: NSR, rate 60 bpm. Normal EKG  Assessment and Plan:   1. CAD, ARTERY BYPASS GRAFT: Stable. Continue current meds. No changes today. He is back on a beta blocker, ASA, statin. Last LDL was 90 in August 2013 in primary care. Zetia was added. BP controlled.   2. Post-operative atrial fibrillation: No recurrence. Continue beta blocker.

## 2013-03-17 ENCOUNTER — Ambulatory Visit (HOSPITAL_COMMUNITY)
Admission: RE | Admit: 2013-03-17 | Discharge: 2013-03-17 | Disposition: A | Discharge status: Home / Self Care | Payer: BC Managed Care – PPO | Source: Ambulatory Visit | Attending: Internal Medicine | Admitting: Internal Medicine

## 2013-03-17 ENCOUNTER — Other Ambulatory Visit (HOSPITAL_COMMUNITY): Payer: Self-pay | Admitting: Internal Medicine

## 2013-03-17 DIAGNOSIS — M25551 Pain in right hip: Secondary | ICD-10-CM

## 2013-03-17 DIAGNOSIS — M25559 Pain in unspecified hip: Secondary | ICD-10-CM | POA: Insufficient documentation

## 2013-04-21 ENCOUNTER — Telehealth: Payer: Self-pay | Admitting: Cardiovascular Disease

## 2013-04-21 NOTE — Telephone Encounter (Signed)
Left pt a message to call back. 

## 2013-04-21 NOTE — Telephone Encounter (Signed)
New Problem  Pt states he does not have a stress test scheduled and wants to have one scheduled prior to or the same day as his appt.Marland Kitchen

## 2013-04-24 NOTE — Telephone Encounter (Signed)
Returned call to patient he stated he has a appointment with Dr.McAlhany 05/27/13 and he wanted to know does he need a stress test before appointment.Stated he would like to have a stress test.Message sent to Dr.McAlhany.

## 2013-04-24 NOTE — Telephone Encounter (Signed)
Returned call to patient Dr.McAlhany advised to keep appointment with him 05/27/13 as planned and he will discuss at visit whether or not to have a stress test.

## 2013-04-24 NOTE — Telephone Encounter (Signed)
I will see him as scheduled and we will discuss plan including whether or not to plan a stress test. Thanks, chris

## 2013-04-24 NOTE — Telephone Encounter (Signed)
F/up   Pt has had to 2 surgeries and believes its common to have a stress test/// wants to know if it is possible to get this ordered. during the visit or prior too.

## 2013-05-06 ENCOUNTER — Encounter: Payer: Self-pay | Admitting: Cardiology

## 2013-05-27 ENCOUNTER — Encounter: Payer: Self-pay | Admitting: Cardiovascular Disease

## 2013-05-27 ENCOUNTER — Ambulatory Visit (INDEPENDENT_AMBULATORY_CARE_PROVIDER_SITE_OTHER): Payer: BC Managed Care – PPO | Admitting: Cardiovascular Disease

## 2013-05-27 VITALS — BP 132/84 | HR 59 | Ht 68.0 in | Wt 185.0 lb

## 2013-05-27 DIAGNOSIS — E785 Hyperlipidemia, unspecified: Secondary | ICD-10-CM

## 2013-05-27 DIAGNOSIS — I25810 Atherosclerosis of coronary artery bypass graft(s) without angina pectoris: Secondary | ICD-10-CM

## 2013-05-27 DIAGNOSIS — I2510 Atherosclerotic heart disease of native coronary artery without angina pectoris: Secondary | ICD-10-CM

## 2013-05-27 DIAGNOSIS — I5189 Other ill-defined heart diseases: Secondary | ICD-10-CM

## 2013-05-27 DIAGNOSIS — I519 Heart disease, unspecified: Secondary | ICD-10-CM

## 2013-05-27 NOTE — Progress Notes (Signed)
History of Present Illness: Jeremy Hendricks is a 57 yo male former pt of Dr. Daleen Squibb with a h/o CAD, s/p CABG in 2002 and redo in January 2012. He saw Dr. Daleen Squibb in January 2012 with chest discomfort and was noted to have an abnormal Myoview. He was referred for cardiac catheterization by Dr. Antoine Poche. This demonstrated severe graft disease and native three-vessel CAD. He was referred for redo bypass. His LIMA to LAD remained patent. His grafts at his surgery on 09/01/10 with Dr. [name] included a left radial-PDA and a free RIMA-ramus intermedius. Postoperatively he developed atrial fibrillation with rapid ventricular rate and was placed on amiodarone. He did suffer from blood loss anemia and underwent transfusion with packed red blood cells. He also developed a left pleural effusion that was treated with thoracentesis. I saw him as an add on to my schedule February 2012 for evaluation of dyspnea. Echo February 2012 with normal LVEF, 60%. BNP was normal. Exercise stress test April 2012 with good exercise tolerance and no ischemia.   He is here today for follow up. He tells me that he has been doing well. No chest pain. His energy level has been good. He has been exercising 3-4 days  No SOB. No SOB since restarting Lasix last year.    Primary Care Physician: Carylon Perches  Last Lipid Profile: Followed in primary care.    Past Medical History  Diagnosis Date  . Coronary artery disease     s/p CABG 2002, redo 2012  . Atrial fibrillation     postoperative  . DJD (degenerative joint disease)     Past Surgical History  Procedure Laterality Date  . Coronary artery bypass graft  2002  . Coronary artery bypass graft  08-2010    L-LAD remained from original CABG; new grafts incl L radial- PDA + RIMA-RI    Current Outpatient Prescriptions  Medication Sig Dispense Refill  . aspirin 325 MG tablet Take 325 mg by mouth daily.        Marland Kitchen atenolol (TENORMIN) 25 MG tablet Take 25 mg by mouth daily.      .  furosemide (LASIX) 40 MG tablet TAKE ONE TABLET BY MOUTH TWICE DAILY  60 tablet  12  . Multiple Vitamin (MULTIVITAMIN) tablet Take 1 tablet by mouth daily.        . nitroGLYCERIN (NITROSTAT) 0.4 MG SL tablet Place 0.4 mg under the tongue every 5 (five) minutes as needed.        . rosuvastatin (CRESTOR) 20 MG tablet Take 40 mg by mouth daily.       Marland Kitchen testosterone (ANDROGEL) 50 MG/5GM GEL Place 5 g onto the skin daily.        Marland Kitchen ZETIA 10 MG tablet 1 TAB DAILY       No current facility-administered medications for this visit.    No Known Allergies  History   Social History  . Marital Status: Single    Spouse Name: N/A    Number of Children: N/A  . Years of Education: N/A   Occupational History  . 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 Smoking status: Former Smoker    Quit date: 08/14/1999  . Smokeless tobacco: Not on file  . Alcohol Use: No  . Drug Use: No  . Sexual Activity: Not on file   Other Topics Concern  . Not on file   Social History Narrative  . No narrative on  file    Family History  Problem Relation Age of Onset  . Heart attack Father 33  . Cancer      family hx of  . Coronary artery disease      family hx of  . Hyperlipidemia      family hx of    Review of Systems:  As stated in the HPI and otherwise negative.   BP 132/84  Pulse 59  Ht 5\' 8"  (1.727 m)  Wt 185 lb (83.915 kg)  BMI 28.14 kg/m2  Physical Examination: General: Well developed, well nourished, NAD HEENT: OP clear, mucus membranes moist SKIN: warm, dry. No rashes. Neuro: No focal deficits Musculoskeletal: Muscle strength 5/5 all ext Psychiatric: Mood and affect normal Neck: No JVD, no carotid bruits, no thyromegaly, no lymphadenopathy. Lungs:Clear bilaterally, no wheezes, rhonci, crackles Cardiovascular: Regular rate and rhythm. No murmurs, gallops or rubs. Abdomen:Soft. Bowel sounds present. Non-tender.  Extremities: No lower extremity edema.  Pulses are 2 + in the bilateral DP/PT.  EKG: sinus brady, rate 59 bpm. Normal EKG  Assessment and Plan:   1. CAD: s/p redo CABG January 2012.  Stable. Continue current meds. No changes today. He is back on a beta blocker, ASA, statin. Lipids well controlled in primary care per pt. BP controlled. Will arrange echo to assess LV systolic function.   2. Post-operative atrial fibrillation: No recurrence. Continue beta blocker.   3. LV Diastolic Dysfunction, NYHA class II: Continue Lasix. Repeat echo.

## 2013-05-27 NOTE — Patient Instructions (Signed)

## 2013-06-01 ENCOUNTER — Other Ambulatory Visit: Payer: Self-pay | Admitting: Cardiology

## 2013-06-15 ENCOUNTER — Ambulatory Visit (HOSPITAL_COMMUNITY): Payer: BC Managed Care – PPO | Attending: Cardiovascular Disease

## 2013-06-15 DIAGNOSIS — E785 Hyperlipidemia, unspecified: Secondary | ICD-10-CM | POA: Insufficient documentation

## 2013-06-15 DIAGNOSIS — R0989 Other specified symptoms and signs involving the circulatory and respiratory systems: Secondary | ICD-10-CM | POA: Insufficient documentation

## 2013-06-15 DIAGNOSIS — Z951 Presence of aortocoronary bypass graft: Secondary | ICD-10-CM | POA: Insufficient documentation

## 2013-06-15 DIAGNOSIS — I079 Rheumatic tricuspid valve disease, unspecified: Secondary | ICD-10-CM | POA: Insufficient documentation

## 2013-06-15 DIAGNOSIS — I5189 Other ill-defined heart diseases: Secondary | ICD-10-CM

## 2013-06-15 DIAGNOSIS — Z87891 Personal history of nicotine dependence: Secondary | ICD-10-CM | POA: Insufficient documentation

## 2013-06-15 DIAGNOSIS — I519 Heart disease, unspecified: Secondary | ICD-10-CM

## 2013-06-15 DIAGNOSIS — I4891 Unspecified atrial fibrillation: Secondary | ICD-10-CM | POA: Insufficient documentation

## 2013-06-15 DIAGNOSIS — I379 Nonrheumatic pulmonary valve disorder, unspecified: Secondary | ICD-10-CM | POA: Insufficient documentation

## 2013-06-15 DIAGNOSIS — I2510 Atherosclerotic heart disease of native coronary artery without angina pectoris: Secondary | ICD-10-CM

## 2013-06-15 DIAGNOSIS — R0609 Other forms of dyspnea: Secondary | ICD-10-CM | POA: Insufficient documentation

## 2013-06-15 NOTE — Progress Notes (Signed)
Echocardiogram performed.  

## 2013-11-16 ENCOUNTER — Other Ambulatory Visit: Payer: Self-pay | Admitting: Cardiovascular Disease

## 2013-12-04 ENCOUNTER — Telehealth: Payer: Self-pay | Admitting: Cardiovascular Disease

## 2013-12-04 DIAGNOSIS — I2510 Atherosclerotic heart disease of native coronary artery without angina pectoris: Secondary | ICD-10-CM

## 2013-12-04 NOTE — Telephone Encounter (Signed)
Pt would like to know if he can have blood work prior his Appointment on 01/28/14: CBC,LFT,Lipid,PSA, and BMP. Pt is aware that if blood work is aprobed to done prior appointment by MD; it will needs to be done closed to the appointment date (first week in June). Pt verbalized understanding.

## 2013-12-04 NOTE — Telephone Encounter (Signed)
New Message:  PT is requesting that thorough blood work be ordered for him prior to his June appt w/ Angelena Form.. CBC, Hepatic Function, Lipid, PSA and BMP.. Pt wants a call back from the nurse to know if that is possible

## 2013-12-07 NOTE — Telephone Encounter (Signed)
Can we let him know this is ok and arrange? cdm

## 2013-12-08 ENCOUNTER — Ambulatory Visit: Payer: BC Managed Care – PPO | Admitting: Cardiovascular Disease

## 2013-12-08 NOTE — Telephone Encounter (Signed)
Spoke with pt and he had recent lab work done through his employer and he will bring copy of these labs to office visit on January 28, 2014

## 2014-01-01 ENCOUNTER — Encounter: Payer: Self-pay | Admitting: Cardiovascular Disease

## 2014-01-07 ENCOUNTER — Ambulatory Visit (INDEPENDENT_AMBULATORY_CARE_PROVIDER_SITE_OTHER): Payer: BC Managed Care – PPO | Admitting: Otolaryngology

## 2014-01-07 DIAGNOSIS — H903 Sensorineural hearing loss, bilateral: Secondary | ICD-10-CM

## 2014-01-26 ENCOUNTER — Telehealth: Payer: Self-pay | Admitting: Cardiovascular Disease

## 2014-01-26 NOTE — Telephone Encounter (Signed)
Spoke with patient who states he is uncertain as to whether or not he needs to get lab work done prior to office visit on 7/22 with Dr. Angelena Form - states a reminder popped up on his calendar and he is uncertain as to whether or not he has an appointment.  I advised patient that there are no lab appointments in the system and per chart review patient may bring lab work that he got through his employer to his next office visit.  I advised patient that since his appointment is at 0800 to come fasting for additional lab work that Dr. Angelena Form might want.  Patient verbalized understanding and agreement with plan.

## 2014-01-26 NOTE — Telephone Encounter (Signed)
Patient needs order for lab work.He would like this done in [place] @ [facility] please call and advise. Patient is real confused if he need lab work or not. Please call

## 2014-01-28 ENCOUNTER — Ambulatory Visit: Payer: BC Managed Care – PPO | Admitting: Cardiovascular Disease

## 2014-03-03 ENCOUNTER — Encounter: Payer: Self-pay | Admitting: Cardiovascular Disease

## 2014-03-03 ENCOUNTER — Ambulatory Visit (INDEPENDENT_AMBULATORY_CARE_PROVIDER_SITE_OTHER): Payer: BC Managed Care – PPO | Admitting: Cardiovascular Disease

## 2014-03-03 VITALS — BP 108/68 | HR 54 | Ht 68.0 in | Wt 180.0 lb

## 2014-03-03 DIAGNOSIS — I519 Heart disease, unspecified: Secondary | ICD-10-CM

## 2014-03-03 DIAGNOSIS — I5189 Other ill-defined heart diseases: Secondary | ICD-10-CM

## 2014-03-03 DIAGNOSIS — I2510 Atherosclerotic heart disease of native coronary artery without angina pectoris: Secondary | ICD-10-CM

## 2014-03-03 DIAGNOSIS — E785 Hyperlipidemia, unspecified: Secondary | ICD-10-CM

## 2014-03-03 NOTE — Patient Instructions (Signed)
Your physician wants you to follow-up in:  12 months.  You will receive a reminder letter in the mail two months in advance. If you don't receive a letter, please call our office to schedule the follow-up appointment.   

## 2014-03-03 NOTE — Progress Notes (Signed)
History of Present Illness: Jeremy Hendricks is a 59 yo male former pt of Dr. Verl Blalock with a h/o CAD, s/p CABG in 2002 and redo in January 2012. He saw Dr. Verl Blalock in January 2012 with chest discomfort and was noted to have an abnormal Myoview. He was referred for cardiac catheterization by Dr. Percival Spanish. This demonstrated severe graft disease and native three-vessel CAD. He was referred for redo bypass. His LIMA to LAD remained patent. His grafts at his surgery on 09/01/10 with Dr. Roxan Hockey included a left radial-PDA and a free RIMA-ramus intermedius. Postoperatively he developed atrial fibrillation with rapid ventricular rate and was placed on amiodarone. I saw him as an add on to my schedule February 2012 for evaluation of dyspnea. Exercise stress test April 2012 with good exercise tolerance and no ischemia. Echo 06/15/13 with normal LVEF, no valve disease.   He is here today for follow up. He tells me that he has been doing well. No chest pain. His energy level has been good. He has been exercising 3-4 days per week with mixed cardio and 20 minutes of rowing. No SOB.  Primary Care Physician: Asencion Noble  Last Lipid Profile: Followed in primary care. (Total chol: 96, HDL: 34, LDL: 47 March 2015)  Past Medical History  Diagnosis Date  . Coronary artery disease     s/p CABG 2002, redo 2012  . Atrial fibrillation     postoperative  . DJD (degenerative joint disease)     Past Surgical History  Procedure Laterality Date  . Coronary artery bypass graft  2002  . Coronary artery bypass graft  08-2010    L-LAD remained from original CABG; new grafts incl L radial- PDA + RIMA-RI    Current Outpatient Prescriptions  Medication Sig Dispense Refill  . aspirin 325 MG tablet Take 325 mg by mouth daily.        Marland Kitchen atenolol (TENORMIN) 25 MG tablet Take 25 mg by mouth daily.      . furosemide (LASIX) 40 MG tablet TAKE ONE TABLET BY MOUTH TWICE DAILY  60 tablet  0  . Multiple Vitamin (MULTIVITAMIN) tablet  Take 1 tablet by mouth daily.        . nitroGLYCERIN (NITROSTAT) 0.4 MG SL tablet Place 0.4 mg under the tongue every 5 (five) minutes as needed.        . rosuvastatin (CRESTOR) 40 MG tablet Take 40 mg by mouth daily.      Marland Kitchen testosterone (ANDROGEL) 50 MG/5GM GEL Place 5 g onto the skin daily.        Marland Kitchen ZETIA 10 MG tablet 1 TAB DAILY       No current facility-administered medications for this visit.    No Known Allergies  History   Social History  . Marital Status: Single    Spouse Name: N/A    Number of Children: N/A  . Years of Education: N/A   Occupational History  . 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 Smoking status: Former Smoker    Quit date: 08/14/1999  . Smokeless tobacco: Not on file  . Alcohol Use: No  . Drug Use: No  . Sexual Activity: Not on file   Other Topics Concern  . Not on file   Social History Narrative  . No narrative on file    Family History  Problem Relation Age of Onset  . Heart attack Father 27  . Cancer  family hx of  . Coronary artery disease      family hx of  . Hyperlipidemia      family hx of    Review of Systems:  As stated in the HPI and otherwise negative.   BP 108/68  Pulse 54  Ht 5\' 8"  (1.727 m)  Wt 180 lb (81.647 kg)  BMI 27.38 kg/m2  Physical Examination: General: Well developed, well nourished, NAD HEENT: OP clear, mucus membranes moist SKIN: warm, dry. No rashes. Neuro: No focal deficits Musculoskeletal: Muscle strength 5/5 all ext Psychiatric: Mood and affect normal Neck: No JVD, no carotid bruits, no thyromegaly, no lymphadenopathy. Lungs:Clear bilaterally, no wheezes, rhonci, crackles Cardiovascular: Regular rate and rhythm. No murmurs, gallops or rubs. Abdomen:Soft. Bowel sounds present. Non-tender.  Extremities: No lower extremity edema. Pulses are 2 + in the bilateral DP/PT.  EKG: Sinus, rate 54 bpm.   Echo 06/15/13: Left ventricle: The cavity size was  normal. Wall thickness was increased in a pattern of mild LVH. Systolic function was normal. The estimated ejection fraction was in the range of 60% to 65%.  Assessment and Plan:   1. CAD: s/p redo CABG January 2012.  Stable. Continue current meds. No changes today.   2. Post-operative atrial fibrillation: No recurrence. Sinus today. Continue beta blocker.   3. LV Diastolic Dysfunction, NYHA class II: Volume stable. Continue Lasix.

## 2014-05-28 NOTE — Telephone Encounter (Signed)
Nothing was typed/tmj 

## 2015-01-04 NOTE — Progress Notes (Signed)
Cardiology Office Note   Date:  01/05/2015   ID:  Jeremy Hendricks, DOB November 13, 1955, MRN [id number]  PCP:  Asencion Noble, MD  Cardiologist:  Dr. Lauree Chandler     Chief Complaint  Patient presents with  . Coronary Artery Disease     History of Present Illness: Jeremy Hendricks is a 59 y.o. male with a hx of CAD s/p CABG in 2002 and redo in 08/2010 (L radial - PDA, free RIMA-RI), post op AF.  Last seen by Dr. Lauree Chandler 02/2014.  He is here for FU today.  He is doing very well.  He remains very active and exercises several times a week.  The patient denies chest pain, shortness of breath, syncope, orthopnea, PND or significant pedal edema.    Studies/Reports Reviewed Today:  Echo 06/15/13 Left ventricle: The cavity size was normal. Wall thickness was increased in a pattern of mild LVH. Systolic function was normal. The estimated ejection fraction was in the range of 60% to 65%.  ETT 11/15/11 ETT Interpretation: normal - no evidence of ischemia by ST analysis  LHC 08/2010 L-LAD ok S-RI occluded prox S-PDA with severe diff disease (ostial 50%, prox 70%, dist 90%) EF 60%   Past Medical History  Diagnosis Date  . Coronary artery disease     s/p CABG 2002, redo 2012  . Atrial fibrillation     postoperative  . DJD (degenerative joint disease)     Past Surgical History  Procedure Laterality Date  . Coronary artery bypass graft  2002  . Coronary artery bypass graft  08-2010    L-LAD remained from original CABG; new grafts incl L radial- PDA + RIMA-RI     Current Outpatient Prescriptions  Medication Sig Dispense Refill  . aspirin 325 MG tablet Take 325 mg by mouth daily.      Marland Kitchen atenolol (TENORMIN) 25 MG tablet Take 25 mg by mouth daily.    . furosemide (LASIX) 40 MG tablet TAKE ONE TABLET BY MOUTH TWICE DAILY 60 tablet 0  . Multiple Vitamin (MULTIVITAMIN) tablet Take 1 tablet by mouth daily.      . nitroGLYCERIN (NITROSTAT) 0.4 MG SL tablet Place 0.4 mg under the  tongue every 5 (five) minutes as needed.      . rosuvastatin (CRESTOR) 40 MG tablet Take 40 mg by mouth daily.    Marland Kitchen testosterone (ANDROGEL) 50 MG/5GM GEL Place 5 g onto the skin daily.      Marland Kitchen ZETIA 10 MG tablet 1 TAB DAILY     No current facility-administered medications for this visit.    Allergies:   Review of patient's allergies indicates no known allergies.    Social History:  The patient  reports that he quit smoking about 15 years ago. He does not have any smokeless tobacco history on file. He reports that he does not drink alcohol or use illicit drugs.   Family History:  The patient's family history includes Cancer in an other family member; Coronary artery disease in an other family member; Heart attack (age of onset: 2) in his father; Hyperlipidemia in an other family member; Hypertension in his father and paternal grandfather. There is no history of Stroke.    ROS:   Please see the history of present illness.   Review of Systems  Gastrointestinal: Positive for diarrhea.  All other systems reviewed and are negative.    PHYSICAL EXAM: VS:  BP 110/75 mmHg  Pulse 57  Ht 5\' 8"  (1.727 m)  Wt 181 lb (82.101 kg)  BMI 27.53 kg/m2    Wt Readings from Last 3 Encounters:  01/05/15 181 lb (82.101 kg)  03/03/14 180 lb (81.647 kg)  05/27/13 185 lb (83.915 kg)     GEN: Well nourished, well developed, in no acute distress HEENT: normal Neck: no JVD,   no masses Cardiac:  Normal S1/S2, RRR; no murmur ,  no rubs or gallops, no edema   Respiratory:  clear to auscultation bilaterally, no wheezing, rhonchi or rales. GI: soft, nontender, nondistended, + BS MS: no deformity or atrophy Skin: warm and dry  Neuro:  CNs II-XII intact, Strength and sensation are intact Psych: Normal affect   EKG:  EKG is ordered today.  It demonstrates:   Sinus bradycardia, HR 57, normal axis, J-point elevation, T-wave inversions in V1-V3, first-degree AV block   Recent Labs: No results found for  requested labs within last 365 days.    Lipid Panel    Component Value Date/Time   CHOL 133 06/12/2010   TRIG 98 06/12/2010   HDL 35 06/12/2010   LDLCALC 78 06/12/2010      ASSESSMENT AND PLAN:  Coronary artery disease involving native coronary artery of native heart without angina pectoris:  No angina.  He is very active without symptoms.  It has been 3 years since his last assessment for ischemia.  Arrange ETT.  He has baseline TWI.  The ETT may be abnormal resulting in the need to do a nuclear stress test.  Will see what his ETT shows first.  Continue ASA, beta-blocker, statin.  Chronic Diastolic CHF:  Volume stable.    PAF (paroxysmal atrial fibrillation):  Post op AF.  No recurrence.  Continue beta-blocker.    Hyperlipidemia:  Continue statin.  Last LDL done through his work.  He thinks his LDL was > 70.  He would like to do anything possible to prevent progression of CAD.  He will send me his lab results.  If his LDL is > 70, I will see if he will qualify for Spire-1.  He is on max dose Crestor.   Current medicines are reviewed at length with the patient today.  Concerns regarding medicines are as outlined above.  The following changes have been made:    None    Labs/ tests ordered today include:  Orders Placed This Encounter  Procedures  . Exercise Tolerance Test  . EKG 12-Lead    Disposition:   FU with Dr. Lauree Chandler 1 year.   Signed, Hinton, Luellen, MHS 01/05/2015 12:01 PM    [facility] [address], [place], [place]  [address] Phone: [phone]; Fax: [phone]

## 2015-01-05 ENCOUNTER — Ambulatory Visit (INDEPENDENT_AMBULATORY_CARE_PROVIDER_SITE_OTHER): Payer: BC Managed Care – PPO | Admitting: Physician Assistant

## 2015-01-05 ENCOUNTER — Encounter: Payer: Self-pay | Admitting: Physician Assistant

## 2015-01-05 VITALS — BP 110/75 | HR 57 | Ht 68.0 in | Wt 181.0 lb

## 2015-01-05 DIAGNOSIS — I480 Paroxysmal atrial fibrillation: Secondary | ICD-10-CM

## 2015-01-05 DIAGNOSIS — E785 Hyperlipidemia, unspecified: Secondary | ICD-10-CM | POA: Diagnosis not present

## 2015-01-05 DIAGNOSIS — I2510 Atherosclerotic heart disease of native coronary artery without angina pectoris: Secondary | ICD-10-CM | POA: Diagnosis not present

## 2015-01-05 DIAGNOSIS — I5032 Chronic diastolic (congestive) heart failure: Secondary | ICD-10-CM | POA: Diagnosis not present

## 2015-01-05 NOTE — Patient Instructions (Signed)
Medication Instructions:  Your physician recommends that you continue on your current medications as directed. Please refer to the Current Medication list given to you today.   Labwork: NONE  Testing/Procedures: Your physician has requested that you have an exercise tolerance test THIS IS TO BE DONE BEFORE 02/10/15 PER PT REQUEST.  For further information please visit [email or web address]. Please also follow instruction sheet, as given.  Follow-Up: 1 YEAR WITH DR. Angelena Form  Any Other Special Instructions Will Be Listed Below (If Applicable).

## 2015-01-13 ENCOUNTER — Ambulatory Visit: Payer: BC Managed Care – PPO | Admitting: Physician Assistant

## 2015-01-28 ENCOUNTER — Telehealth (HOSPITAL_COMMUNITY): Payer: Self-pay

## 2015-01-28 NOTE — Telephone Encounter (Signed)
Encounter complete. 

## 2015-01-31 ENCOUNTER — Encounter: Payer: Self-pay | Admitting: Physician Assistant

## 2015-02-01 ENCOUNTER — Telehealth: Payer: Self-pay | Admitting: *Deleted

## 2015-02-01 ENCOUNTER — Ambulatory Visit: Payer: BC Managed Care – PPO | Admitting: Nurse Practitioner

## 2015-02-01 NOTE — Telephone Encounter (Signed)
pt notified of lab results with verbal understanding. Pt verified his stress test appt for 6/22 @ [facility].

## 2015-02-02 ENCOUNTER — Encounter: Payer: Self-pay | Admitting: Physician Assistant

## 2015-02-02 ENCOUNTER — Ambulatory Visit (HOSPITAL_COMMUNITY)
Admission: RE | Admit: 2015-02-02 | Discharge: 2015-02-02 | Disposition: A | Discharge status: Home / Self Care | Payer: BC Managed Care – PPO | Source: Ambulatory Visit | Attending: Physician Assistant | Admitting: Physician Assistant

## 2015-02-02 DIAGNOSIS — I2510 Atherosclerotic heart disease of native coronary artery without angina pectoris: Secondary | ICD-10-CM | POA: Diagnosis present

## 2015-02-02 LAB — EXERCISE TOLERANCE TEST
CHL CUP RESTING HR STRESS: 68 {beats}/min
CHL RATE OF PERCEIVED EXERTION: 15
CSEPEDS: 3 s
Estimated workload: 15.3 METS
Exercise duration (min): 13 min
MPHR: 162 {beats}/min
Peak HR: 144 {beats}/min
Percent HR: 88 %

## 2015-02-04 ENCOUNTER — Telehealth: Payer: Self-pay | Admitting: *Deleted

## 2015-02-04 NOTE — Telephone Encounter (Signed)
lmom stress test normal. If any questions cb [phone].

## 2015-02-10 ENCOUNTER — Telehealth: Payer: Self-pay | Admitting: Cardiovascular Disease

## 2015-02-10 NOTE — Telephone Encounter (Signed)
New Message      Pt calling stating that he messed up his hip really badly and is in a lot of pain. Pt states that his PCP is out of the office until 02/15/15 and pt wants to know if there is any way that he can be prescribed an antiinflammatory by Dr. Angelena Form or Richardson Dopp before the weekend. Please call back and advise.

## 2015-02-10 NOTE — Telephone Encounter (Signed)
Spoke with pt . He reports increased right hip pain since running earlier this week. Asking if Dr. Angelena Form could prescribe pain medication for this.  Primary care doctor not in office.  I told pt Dr. Angelena Form could not prescribe something for this.  I recommended urgent care to have this pain evaluated.

## 2015-02-21 ENCOUNTER — Ambulatory Visit: Payer: BC Managed Care – PPO | Admitting: Physician Assistant

## 2015-06-27 ENCOUNTER — Ambulatory Visit (INDEPENDENT_AMBULATORY_CARE_PROVIDER_SITE_OTHER): Payer: 59 | Admitting: Family Medicine

## 2015-06-27 ENCOUNTER — Encounter: Payer: Self-pay | Admitting: Family Medicine

## 2015-06-27 VITALS — BP 125/72 | HR 55 | Temp 97.8°F | Resp 16 | Wt 178.8 lb

## 2015-06-27 DIAGNOSIS — E785 Hyperlipidemia, unspecified: Secondary | ICD-10-CM | POA: Diagnosis not present

## 2015-06-27 DIAGNOSIS — K644 Residual hemorrhoidal skin tags: Secondary | ICD-10-CM

## 2015-06-27 DIAGNOSIS — I25810 Atherosclerosis of coronary artery bypass graft(s) without angina pectoris: Secondary | ICD-10-CM | POA: Diagnosis not present

## 2015-06-27 DIAGNOSIS — K648 Other hemorrhoids: Secondary | ICD-10-CM

## 2015-06-27 NOTE — Progress Notes (Signed)
Subjective:    Patient ID: Jeremy Hendricks, male    DOB: [birth year], 59 y.o.   MRN: [id number] This chart was scribed for [name], MD by [name], Medical Scribe. This patient was seen in Room 25 and the patient's care was started at 11:13 AM.   HPI HPI Comments: Jeremy Hendricks is a 59 y.o. male who presents to the [facility] to establish care. New patient to me. History of CAD, DJD, and HLD. Cardiology is Angel[facility] Form, last stress test in June of this year. Office visit with cardiology in May. Does have a history of A Fib post-operative. CABG in 2002 with a re-do in January, 2012. Chronic diastolic CHF with stable volumes. He was continued on a beta-blocker for post-operative A Fib without reoccurrence. He was previously followed by Dr. Willey Blade for primary care. He recently switched insurances.  Hyperlipidemia: He was continued on his statin which is Crestor 40 mg qd at last cardiology visit. He had a normal stress test recently. He has had blood work done through his work based on notes. In June, 2016, he had outside labs with LDL of 42, HDL 27, triglycerides 87, total cholesterol 86. Patient ran out of Zetia yesterday; he notes the medication has signficantly increased in price and may not be able to stay on it. He agrees to come in at a later time to have blood work done. Lab Results  Component Value Date   CHOL 133 06/12/2010   HDL 35 06/12/2010   LDLCALC 78 06/12/2010   TRIG 98 06/12/2010    External hemorrhoid: Patient states he has been dealing with what he believes to be an external hemorrhoid for the past 10 days. He has been feeling a pea-sized lump in his rectal area. Patient denies abdominal pain, rectal pain, rectal itching, and rectal bleeding. He has had minor hemorrhoids in the past which typically resolve in a few days. Patient denies history of STDs and new partners.  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   Patient Active Problem List   Diagnosis Date Noted  . INSOMNIA 09/19/2010  . SHORTNESS OF BREATH 09/19/2010  . CHEST PAIN-PRECORDIAL 08/23/2010  . CAD, ARTERY BYPASS GRAFT 10/26/2009  . HYPERLIPIDEMIA 10/24/2009  . DEGENERATIVE JOINT DISEASE 10/24/2009  . ANGINA, HX OF 10/24/2009   Past Medical History  Diagnosis Date  . Coronary artery disease     s/p CABG 2002, redo 2012  . Atrial fibrillation ([place])     postoperative  . DJD (degenerative joint disease)   . History of ETT     a. ETT 6/16:  normal   Past Surgical History  Procedure Laterality Date  . Coronary artery bypass graft  2002  . Coronary artery bypass graft  08-2010    L-LAD remained from original CABG; new grafts incl L radial- PDA + RIMA-RI   Not on File Prior to Admission medications   Medication Sig Start Date End Date Taking? Authorizing Provider  aspirin 325 MG tablet Take 325 mg by mouth daily.     Yes Historical Provider, MD  atenolol (TENORMIN) 25 MG tablet Take 25 mg by mouth daily.   Yes Historical Provider, MD  furosemide (LASIX) 40 MG tablet TAKE ONE TABLET BY MOUTH TWICE DAILY   Yes Burnell Blanks, MD  Multiple Vitamin (MULTIVITAMIN) tablet Take 1 tablet by mouth daily.     Yes Historical Provider, MD  nitroGLYCERIN (NITROSTAT)  0.4 MG SL tablet Place 0.4 mg under the tongue every 5 (five) minutes as needed.     Yes Historical Provider, MD  rosuvastatin (CRESTOR) 40 MG tablet Take 40 mg by mouth daily.   Yes Historical Provider, MD  testosterone (ANDROGEL) 50 MG/5GM GEL Place 5 g onto the skin daily.     Yes Historical Provider, MD  ZETIA 10 MG tablet 1 TAB DAILY 04/30/12  Yes Historical Provider, MD   Social History   Social History  . Marital Status: Single    Spouse Name: N/A  . Number of Children: N/A  . Years of Education: N/A   Occupational History  . 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 Smoking status: Former Smoker     Quit date: 08/14/1999  . Smokeless tobacco: Not on file  . Alcohol Use: No  . Drug Use: No  . Sexual Activity: Not on file   Other Topics Concern  . Not on file   Social History Narrative     Review of Systems  Gastrointestinal: Negative for abdominal pain, anal bleeding and rectal pain.       Objective:   Physical Exam  Constitutional: He is oriented to person, place, and time. He appears well-developed and well-nourished. No distress.  HENT:  Head: Normocephalic and atraumatic.  Mouth/Throat: Oropharynx is clear and moist. No oropharyngeal exudate.  Eyes: Pupils are equal, round, and reactive to light.  Neck: Neck supple.  Cardiovascular: Normal rate.   Pulmonary/Chest: Effort normal.  Genitourinary: Rectal exam shows external hemorrhoid.  Approximately 1 cm external hemorrhoid, about 2 o'clock. No active bleeding. Non-tender.  Musculoskeletal: He exhibits no edema.  Neurological: He is alert and oriented to person, place, and time. No cranial nerve deficit.  Skin: Skin is warm and dry. No rash noted.  Psychiatric: He has a normal mood and affect. His behavior is normal.  Nursing note and vitals reviewed.     Filed Vitals:   06/27/15 1006  BP: 125/72  Pulse: 55  Temp: 97.8 F (36.6 C)  TempSrc: Oral  Resp: 16  Weight: 178 lb 12.8 oz (81.103 kg)       Assessment & Plan:  Jeremy Hendricks is a 59 y.o. male Hyperlipidemia - Plan: COMPLETE METABOLIC PANEL WITH GFR, Lipid panel  - History of CAD, goal LDL less than 70. Did discuss trial off zetia,  as taking maximum dose of statin, may still be at goal with just high-dose statin, but this can be discussed with his cardiologist as well. Plan on lab only visit in 6 weeks to get a better idea of his control off of Zetia.   Coronary artery disease involving coronary bypass graft of native heart without angina pectoris - Plan: COMPLETE METABOLIC PANEL WITH GFR, Lipid panel  - asymptomatic. Continue follow-up with  cardiologist.   External hemorrhoid  - minimal symptoms currently. May have a component of thrombosis, but as not painful or actively bleeding, can continue over-the-counter hydrocortisone or topical treatment if needed. If he has any increasing pain or persistent symptoms, can refer for surgical eval or return to clinic.     No orders of the defined types were placed in this encounter.   Patient Instructions  Return in 6 weeks for lab only visit to look at your cholesterol.   Ok to use over the counter medication if needed for the hemorrhoid. If it does not continue to improve/decrease in size in the  next 2 weeks - return for recheck or I can refer you to a surgeon to have it removed.   Return to the clinic or go to the nearest emergency room if any of your symptoms worsen or new symptoms occur.  Follow up for physical in next 6 months.   Hemorrhoids Hemorrhoids are swollen veins around the rectum or anus. There are two types of hemorrhoids:   Internal hemorrhoids. These occur in the veins just inside the rectum. They may poke through to the outside and become irritated and painful.  External hemorrhoids. These occur in the veins outside the anus and can be felt as a painful swelling or hard lump near the anus. CAUSES  Pregnancy.   Obesity.   Constipation or diarrhea.   Straining to have a bowel movement.   Sitting for long periods on the toilet.  Heavy lifting or other activity that caused you to strain.  Anal intercourse. SYMPTOMS   Pain.   Anal itching or irritation.   Rectal bleeding.   Fecal leakage.   Anal swelling.   One or more lumps around the anus.  DIAGNOSIS  Your caregiver may be able to diagnose hemorrhoids by visual examination. Other examinations or tests that may be performed include:   Examination of the rectal area with a gloved hand (digital rectal exam).   Examination of anal canal using a small tube (scope).   A blood test if  you have lost a significant amount of blood.  A test to look inside the colon (sigmoidoscopy or colonoscopy). TREATMENT Most hemorrhoids can be treated at home. However, if symptoms do not seem to be getting better or if you have a lot of rectal bleeding, your caregiver may perform a procedure to help make the hemorrhoids get smaller or remove them completely. Possible treatments include:   Placing a rubber band at the base of the hemorrhoid to cut off the circulation (rubber band ligation).   Injecting a chemical to shrink the hemorrhoid (sclerotherapy).   Using a tool to burn the hemorrhoid (infrared light therapy).   Surgically removing the hemorrhoid (hemorrhoidectomy).   Stapling the hemorrhoid to block blood flow to the tissue (hemorrhoid stapling).  HOME CARE INSTRUCTIONS   Eat foods with fiber, such as whole grains, beans, nuts, fruits, and vegetables. Ask your doctor about taking products with added fiber in them (fibersupplements).  Increase fluid intake. Drink enough water and fluids to keep your urine clear or pale yellow.   Exercise regularly.   Go to the bathroom when you have the urge to have a bowel movement. Do not wait.   Avoid straining to have bowel movements.   Keep the anal area dry and clean. Use wet toilet paper or moist towelettes after a bowel movement.   Medicated creams and suppositories may be used or applied as directed.   Only take over-the-counter or prescription medicines as directed by your caregiver.   Take warm sitz baths for 15-20 minutes, 3-4 times a day to ease pain and discomfort.   Place ice packs on the hemorrhoids if they are tender and swollen. Using ice packs between sitz baths may be helpful.   Put ice in a plastic bag.   Place a towel between your skin and the bag.   Leave the ice on for 15-20 minutes, 3-4 times a day.   Do not use a donut-shaped pillow or sit on the toilet for long periods. This increases  blood pooling and pain.  [place]  CARE IF:  You have increasing pain and swelling that is not controlled by treatment or medicine.  You have uncontrolled bleeding.  You have difficulty or you are unable to have a bowel movement.  You have pain or inflammation outside the area of the hemorrhoids. MAKE SURE YOU:  Understand these instructions.  Will watch your condition.  Will get help right away if you are not doing well or get worse.   This information is not intended to replace advice given to you by your health care provider. Make sure you discuss any questions you have with your health care provider.   Document Released: 07/27/2000 Document Revised: 07/16/2012 Document Reviewed: 06/03/2012 Elsevier Interactive Patient Education [employer].    I personally performed the services described in this documentation, which was scribed in my presence. The recorded information has been reviewed and considered, and addended by me as needed.    By signing my name below, I, [name], attest that this documentation has been prepared under the direction and in the presence of [name], MD.  Electronically Signed: [name], Medical Scribe. 06/27/2015. 11:29 AM.

## 2015-06-27 NOTE — Patient Instructions (Signed)
Return in 6 weeks for lab only visit to look at your cholesterol.   Ok to use over the counter medication if needed for the hemorrhoid. If it does not continue to improve/decrease in size in the next 2 weeks - return for recheck or I can refer you to a surgeon to have it removed.   Return to the clinic or go to the nearest emergency room if any of your symptoms worsen or new symptoms occur.  Follow up for physical in next 6 months.   Hemorrhoids Hemorrhoids are swollen veins around the rectum or anus. There are two types of hemorrhoids:   Internal hemorrhoids. These occur in the veins just inside the rectum. They may poke through to the outside and become irritated and painful.  External hemorrhoids. These occur in the veins outside the anus and can be felt as a painful swelling or hard lump near the anus. CAUSES  Pregnancy.   Obesity.   Constipation or diarrhea.   Straining to have a bowel movement.   Sitting for long periods on the toilet.  Heavy lifting or other activity that caused you to strain.  Anal intercourse. SYMPTOMS   Pain.   Anal itching or irritation.   Rectal bleeding.   Fecal leakage.   Anal swelling.   One or more lumps around the anus.  DIAGNOSIS  Your caregiver may be able to diagnose hemorrhoids by visual examination. Other examinations or tests that may be performed include:   Examination of the rectal area with a gloved hand (digital rectal exam).   Examination of anal canal using a small tube (scope).   A blood test if you have lost a significant amount of blood.  A test to look inside the colon (sigmoidoscopy or colonoscopy). TREATMENT Most hemorrhoids can be treated at home. However, if symptoms do not seem to be getting better or if you have a lot of rectal bleeding, your caregiver may perform a procedure to help make the hemorrhoids get smaller or remove them completely. Possible treatments include:   Placing a rubber band  at the base of the hemorrhoid to cut off the circulation (rubber band ligation).   Injecting a chemical to shrink the hemorrhoid (sclerotherapy).   Using a tool to burn the hemorrhoid (infrared light therapy).   Surgically removing the hemorrhoid (hemorrhoidectomy).   Stapling the hemorrhoid to block blood flow to the tissue (hemorrhoid stapling).  HOME CARE INSTRUCTIONS   Eat foods with fiber, such as whole grains, beans, nuts, fruits, and vegetables. Ask your doctor about taking products with added fiber in them (fibersupplements).  Increase fluid intake. Drink enough water and fluids to keep your urine clear or pale yellow.   Exercise regularly.   Go to the bathroom when you have the urge to have a bowel movement. Do not wait.   Avoid straining to have bowel movements.   Keep the anal area dry and clean. Use wet toilet paper or moist towelettes after a bowel movement.   Medicated creams and suppositories may be used or applied as directed.   Only take over-the-counter or prescription medicines as directed by your caregiver.   Take warm sitz baths for 15-20 minutes, 3-4 times a day to ease pain and discomfort.   Place ice packs on the hemorrhoids if they are tender and swollen. Using ice packs between sitz baths may be helpful.   Put ice in a plastic bag.   Place a towel between your skin and the bag.  Leave the ice on for 15-20 minutes, 3-4 times a day.   Do not use a donut-shaped pillow or sit on the toilet for long periods. This increases blood pooling and pain.  SEEK MEDICAL CARE IF:  You have increasing pain and swelling that is not controlled by treatment or medicine.  You have uncontrolled bleeding.  You have difficulty or you are unable to have a bowel movement.  You have pain or inflammation outside the area of the hemorrhoids. MAKE SURE YOU:  Understand these instructions.  Will watch your condition.  Will get help right away if you  are not doing well or get worse.   This information is not intended to replace advice given to you by your health care provider. Make sure you discuss any questions you have with your health care provider.   Document Released: 07/27/2000 Document Revised: 07/16/2012 Document Reviewed: 06/03/2012 Elsevier Interactive Patient Education [employer].

## 2015-07-20 ENCOUNTER — Other Ambulatory Visit (INDEPENDENT_AMBULATORY_CARE_PROVIDER_SITE_OTHER): Payer: 59 | Admitting: Family Medicine

## 2015-07-20 DIAGNOSIS — I25810 Atherosclerosis of coronary artery bypass graft(s) without angina pectoris: Secondary | ICD-10-CM

## 2015-07-20 DIAGNOSIS — E291 Testicular hypofunction: Secondary | ICD-10-CM

## 2015-07-20 DIAGNOSIS — E785 Hyperlipidemia, unspecified: Secondary | ICD-10-CM

## 2015-07-20 LAB — COMPLETE METABOLIC PANEL WITH GFR
ALT: 34 U/L (ref 9–46)
AST: 25 U/L (ref 10–35)
Albumin: 4.5 g/dL (ref 3.6–5.1)
Alkaline Phosphatase: 54 U/L (ref 40–115)
BILIRUBIN TOTAL: 0.6 mg/dL (ref 0.2–1.2)
BUN: 20 mg/dL (ref 7–25)
CHLORIDE: 99 mmol/L (ref 98–110)
CO2: 26 mmol/L (ref 20–31)
Calcium: 9.8 mg/dL (ref 8.6–10.3)
Creat: 1.22 mg/dL (ref 0.70–1.33)
GFR, EST AFRICAN AMERICAN: 75 mL/min (ref >=60)
GFR, Est Non African American: 65 mL/min (ref >=60)
GLUCOSE: 60 mg/dL — AB (ref 65–99)
POTASSIUM: 5.2 mmol/L (ref 3.5–5.3)
SODIUM: 140 mmol/L (ref 135–146)
Total Protein: 7.1 g/dL (ref 6.1–8.1)

## 2015-07-20 LAB — LIPID PANEL
CHOLESTEROL: 116 mg/dL — AB (ref 125–200)
HDL: 28 mg/dL — ABNORMAL LOW (ref >=40)
LDL Cholesterol: 68 mg/dL (ref <130)
Total CHOL/HDL Ratio: 4.1 Ratio (ref <=5.0)
Triglycerides: 99 mg/dL (ref <150)
VLDL: 20 mg/dL (ref <30)

## 2015-07-20 LAB — TESTOSTERONE, FREE, TOTAL, SHBG
Sex Hormone Binding: 27 nmol/L (ref 22–77)
TESTOSTERONE FREE: 239.1 pg/mL (ref 47.0–244.0)
TESTOSTERONE-% FREE: 2.6 % (ref 1.6–2.9)
Testosterone: 914 ng/dL — ABNORMAL HIGH (ref 300–890)

## 2015-07-20 NOTE — Progress Notes (Signed)
Patient here today for lab draw only. 

## 2015-07-20 NOTE — Addendum Note (Signed)
Addended by: Kyra Manges on: 07/20/2015 09:20 AM   Modules accepted: Orders

## 2015-08-18 ENCOUNTER — Encounter: Payer: Self-pay | Admitting: Family Medicine

## 2015-08-21 ENCOUNTER — Other Ambulatory Visit: Payer: Self-pay | Admitting: Family Medicine

## 2015-08-21 DIAGNOSIS — E785 Hyperlipidemia, unspecified: Secondary | ICD-10-CM

## 2015-08-21 DIAGNOSIS — R7989 Other specified abnormal findings of blood chemistry: Secondary | ICD-10-CM

## 2015-09-08 ENCOUNTER — Telehealth: Payer: Self-pay

## 2015-09-08 DIAGNOSIS — E291 Testicular hypofunction: Secondary | ICD-10-CM

## 2015-09-08 MED ORDER — TESTOSTERONE 50 MG/5GM (1%) TD GEL: 5.0000 g | TRANSDERMAL | Status: DC

## 2015-09-08 NOTE — Telephone Encounter (Signed)
Also received req from pharm to Rx

## 2015-09-08 NOTE — Telephone Encounter (Signed)
Attention: Dr. Carlota Raspberry, Pt would like a prescription for Testim. I let him know he would probably have to an OV for this. Pharmacy at [facility] CB #  [id number]

## 2015-09-08 NOTE — Telephone Encounter (Signed)
rx for Androgel QOD placed.

## 2015-09-08 NOTE — Telephone Encounter (Signed)
Pharm req was for androgel, so called pt who stated it doesn't matter to him which he uses, whichever preferred by new ins ([employer] ID # [id number]). Called pharm (new to pt) who advised that both androgel and testim req PA. They have a Rx for testim on file written by prev MD, but would need a new Rx for Androgel if that is what ins prefers.  Dr Carlota Raspberry, [place] prefers Androgel.  The current dose of testim is 50 mg/5 gram gel, apply 1 packet once daily as directed. But it looks like pt's testosterone was high on labs done in Dec. See notes/plan under 08/18/15 pt email where pt stated:  Dear Dr. Carlota Raspberry,   Thanks for the alert on the testosterone levels. I will decrease my use to every other day and we can see the results thereafter     Can you send in a new Rx for Androgel at what ever dose you would like pt to use, please? Then I can do a PA on that and it should be covered and cheaper for pt than the Testim.

## 2015-09-08 NOTE — Addendum Note (Signed)
Addended by: [name] R on: 09/08/2015 05:53 PM   Modules accepted: Orders

## 2015-09-09 ENCOUNTER — Telehealth: Payer: Self-pay

## 2015-09-09 DIAGNOSIS — E291 Testicular hypofunction: Secondary | ICD-10-CM | POA: Insufficient documentation

## 2015-09-09 NOTE — Telephone Encounter (Signed)
Spoke to [employer] at Dr Ria Comment office and she checked back in paper chart to get the lowest reading on testosterone level when pt was first started on androgel. His level was not actually abnormal, however, just very low normal and he had significant symptoms. Jewel tried to fax me a copy several times to both our fax numbers and could not get it to go through (showed busy signal, but our fax did not appear busy). She stated they had some trouble earlier w/their fax machine. She will try to send it again Monday.

## 2015-09-09 NOTE — Telephone Encounter (Signed)
Started form to send to [facility] (covermymeds reqs uploading test results which I can not do). Need to include a baseline abnormal low testosterone level which we do not have. Tried to call Dr Ria Comment office to get lab results, but could not get through at the time. Called pt who stated Dr Willey Blade closes in middle of day. I will try back later as pt does not know if he has a copy of the labs needed.

## 2015-09-09 NOTE — Telephone Encounter (Signed)
Attention: Pamala Hurry, Reference call  from 1/26. He would like [place] to try call Dr. Willey Blade at [phone] sometime on Monday. CB # [id number]

## 2015-09-12 ENCOUNTER — Telehealth: Payer: Self-pay | Admitting: Family Medicine

## 2015-09-12 NOTE — Telephone Encounter (Signed)
Lab result received from Dr Ria Comment office and I faxed in with the PA form. Pending.

## 2015-09-12 NOTE — Telephone Encounter (Signed)
Good afternoon,.  I was getting your paperwork ready for your physical next week with Dr. Carlota Raspberry, and I noticed that you need a colonoscopy.  Please let me know if I can schedule that for you.  We will be glad to send you to [facility] or if you prefer someone else just let me know.  I look forward to hearing from you on this matter.

## 2015-09-12 NOTE — Telephone Encounter (Signed)
Pt CB to check status, which I gave him and advised I will call him as soon as I get an answer.

## 2015-09-19 ENCOUNTER — Encounter: Payer: 59 | Admitting: Family Medicine

## 2015-09-19 NOTE — Telephone Encounter (Signed)
Called ins to check status and was told PA was denied bc pt's testosterone level was w/in the normal range. Notified pt and he will do without for now and see how Sxs are after not being off of it for a while.

## 2015-09-21 ENCOUNTER — Ambulatory Visit (INDEPENDENT_AMBULATORY_CARE_PROVIDER_SITE_OTHER): Payer: BLUE CROSS/BLUE SHIELD | Admitting: Family Medicine

## 2015-09-21 ENCOUNTER — Encounter: Payer: Self-pay | Admitting: Family Medicine

## 2015-09-21 VITALS — BP 126/76 | HR 60 | Temp 98.2°F | Resp 16 | Ht 67.75 in | Wt 174.8 lb

## 2015-09-21 DIAGNOSIS — Z87898 Personal history of other specified conditions: Secondary | ICD-10-CM

## 2015-09-21 DIAGNOSIS — E785 Hyperlipidemia, unspecified: Secondary | ICD-10-CM | POA: Diagnosis not present

## 2015-09-21 DIAGNOSIS — Z0000 Encounter for general adult medical examination without abnormal findings: Secondary | ICD-10-CM

## 2015-09-21 DIAGNOSIS — R0789 Other chest pain: Secondary | ICD-10-CM | POA: Diagnosis not present

## 2015-09-21 NOTE — Progress Notes (Signed)
   Subjective:    Patient ID: Jeremy Hendricks, male    DOB: February 02, 1956, 60 y.o.   MRN: [id number]  HPI    Review of Systems  Constitutional: Negative.   HENT: Positive for hearing loss.   Eyes: Negative.   Respiratory: Positive for apnea.   Cardiovascular: Negative.   Gastrointestinal: Negative.   Endocrine: Negative.   Genitourinary: Negative.   Musculoskeletal: Positive for back pain and arthralgias.  Skin: Negative.   Allergic/Immunologic: Negative.   Neurological: Positive for headaches.  Hematological: Negative.   Psychiatric/Behavioral: Negative.        Objective:   Physical Exam        Assessment & Plan:

## 2015-09-21 NOTE — Progress Notes (Addendum)
Subjective:    Patient ID: [name], male    DOB: 01/27/1956, 60 y.o.   MRN: [id number] By signing my name below, I, Jeremy Hendricks, attest that this documentation has been prepared under the direction and in the presence of Jeremy Ray, MD.  Electronically Signed: Zola Hendricks, Medical Scribe. 09/21/2015. 10:07 AM.  HPI HPI Comments: Jeremy Hendricks is a 60 y.o. male who presents to the [facility] for a complete physical exam.   Cancer screening:  Colon cancer screening - His last colonoscopy was about 9 years ago in [place] and was normal. Prostate cancer screening - History of low testosterone. He does take testosterone supplementation. Most recently his levels were elevated at 914. Recommended decreasing his dose of AndroGel. He has been on AndroGel for about 3 years but has been out for about a month. He is not followed by a urologist. Patient denies difficulty with erections. His last PSA was 1.2 in June 2016, done with Dr. Willey Blade. His FMHx includes prostate cancer (grandfather, in his 45s).  Immunizations: Had his flu shot earlier this year. Immunization History  Administered Date(s) Administered  . Hepatitis B 09/20/2000  . Influenza Split 05/14/2015  . Zoster 08/13/2013    Depression screening:  Depression screen [facility] 2/9 09/21/2015 06/27/2015  Decreased Interest 0 0  Down, Depressed, Hopeless 0 0  PHQ - 2 Score 0 0   Fall screening: 0 falls in the past year.  Vision: He has not had his eyes checked in the past year. No prior eye surgeries per patient.  Visual Acuity Screening   Right eye Left eye Both eyes  Without correction: 20/15 20/13 20/15   With correction:       Dentist: He does see a dentist twice a year.  Exercise: He exercises 6 days a week.  CAD: Cardiologist is Dr. Angelena Form. History of CABG in 2002. Post-op A-fib. Stress test June 2016. Takes atenolol 25 mg qd and aspirin 325 mg qd. Also on Lasix 40 mg BID. He notes that there is a  hardened area that he can feel to his sternum, which he believes is a staple from his prior surgery. Patient has also had a few episodes stinging pain to his chest wall. He denies chest tightness, chest heaviness, and SOB.  Hyperlipidemia: Previously on Zetia, but cost-prohibitive. Currently on Crestor 40 mg qd. Lab Results  Component Value Date   CHOL 116* 07/20/2015   HDL 28* 07/20/2015   LDLCALC 68 07/20/2015   TRIG 99 07/20/2015   CHOLHDL 4.1 07/20/2015    Hearing loss: Patient recently started using hearing aids. He has more difficulty hearing high-pitched sounds.  Apnea: Patient states he has had a few episodes of apnea, waking him up at night. This happens every few months. He does snore. He does have daytime somnolence sometimes if he has had a large meal. Patient denies SOB and orthopnea. He denies history of sleep apnea and has not had a sleep study done before.  History of migraine headaches: years ago. Has not had those recently but approximately every 1-1.5 months, will have brief sensation of dizziness and nausea without headache. States there is no association with chest pain, palpitations or headaches. No known associated activities.  Patient Active Problem List   Diagnosis Date Noted  . Hypogonadism in male 09/09/2015  . INSOMNIA 09/19/2010  . SHORTNESS OF BREATH 09/19/2010  . CHEST PAIN-PRECORDIAL 08/23/2010  . CAD, ARTERY BYPASS GRAFT 10/26/2009  . HYPERLIPIDEMIA 10/24/2009  .  DEGENERATIVE JOINT DISEASE 10/24/2009  . ANGINA, HX OF 10/24/2009   Past Medical History  Diagnosis Date  . Coronary artery disease     s/p CABG 2002, redo 2012  . Atrial fibrillation ([place])     postoperative  . DJD (degenerative joint disease)   . History of ETT     a. ETT 6/16:  normal   Past Surgical History  Procedure Laterality Date  . Coronary artery bypass graft  2002  . Coronary artery bypass graft  08-2010    L-LAD remained from original CABG; new grafts incl L radial- PDA +  RIMA-RI  . Vasectomy     No Known Allergies Prior to Admission medications   Medication Sig Start Date End Date Taking? Authorizing Provider  aspirin 325 MG tablet Take 325 mg by mouth daily.     Yes Historical Provider, MD  atenolol (TENORMIN) 25 MG tablet Take 25 mg by mouth daily.   Yes Historical Provider, MD  furosemide (LASIX) 40 MG tablet TAKE ONE TABLET BY MOUTH TWICE DAILY   Yes Burnell Blanks, MD  Multiple Vitamin (MULTIVITAMIN) tablet Take 1 tablet by mouth daily.     Yes Historical Provider, MD  rosuvastatin (CRESTOR) 40 MG tablet Take 40 mg by mouth daily.   Yes Historical Provider, MD  ZETIA 10 MG tablet 1 TAB DAILY 04/30/12  Yes Historical Provider, MD  nitroGLYCERIN (NITROSTAT) 0.4 MG SL tablet Place 0.4 mg under the tongue every 5 (five) minutes as needed. Reported on 09/21/2015    Historical Provider, MD  testosterone (ANDROGEL) 50 MG/5GM (1%) GEL Place 5 g onto the skin every other day. Patient not taking: Reported on 09/21/2015 09/08/15   Jeremy Agreste, MD   Social History   Social History  . Marital Status: Single    Spouse Name: N/A  . Number of Children: N/A  . Years of Education: N/A   Occupational History  . Realtor    Social History Main Topics  . Smoking status: Former Smoker    Quit date: 08/14/1999  . Smokeless tobacco: Not on file  . Alcohol Use: 4.2 oz/week    7 Standard drinks or equivalent per week  . Drug Use: No  . Sexual Activity: Not on file   Other Topics Concern  . Not on file   Social History Narrative   Single   Education: College   Exercise: Yes     Review of Systems  HENT: Positive for hearing loss.   Respiratory: Positive for apnea.   Musculoskeletal: Positive for back pain and arthralgias.  Neurological: Positive for headaches.  All other systems reviewed and are negative.  13 point ROS reviewed on patient health survey. Negative other than listed above or in nursing note. See nursing note.     Objective:    Physical Exam  Constitutional: He is oriented to person, place, and time. He appears well-developed and well-nourished.  HENT:  Head: Normocephalic and atraumatic.  Right Ear: External ear normal.  Left Ear: External ear normal.  Mouth/Throat: Oropharynx is clear and moist.  Eyes: Conjunctivae and EOM are normal. Pupils are equal, round, and reactive to light.  Neck: Normal range of motion. Neck supple. No thyromegaly present.  Cardiovascular: Normal rate, regular rhythm, normal heart sounds and intact distal pulses.   Pulmonary/Chest: Effort normal and breath sounds normal. No respiratory distress. He has no wheezes.  Abdominal: Soft. He exhibits no distension. There is no tenderness. Hernia confirmed negative in the right inguinal area  and confirmed negative in the left inguinal area.  Genitourinary: Prostate normal.  Musculoskeletal: Normal range of motion. He exhibits no edema or tenderness.  Lymphadenopathy:    He has no cervical adenopathy.  Neurological: He is alert and oriented to person, place, and time. He has normal reflexes.  Skin: Skin is warm and dry.  Well-healed scar to anterior chest wall. Prominent hard area, non-mobile but non-tender at upper sternum.  Psychiatric: He has a normal mood and affect. His behavior is normal.  Vitals reviewed.   Filed Vitals:   09/21/15 0953  BP: 126/76  Pulse: 60  Temp: 98.2 F (36.8 C)  TempSrc: Oral  Resp: 16  Height: 5' 7.75" (1.721 m)  Weight: 174 lb 12.8 oz (79.289 kg)  SpO2: 98%         Assessment & Plan:  JAKAVION LOBIANCO is a 60 y.o. male Annual physical exam  --anticipatory guidance as below in AVS, screening labs above. Health maintenance items as above in HPI discussed/recommended as applicable.   Chest wall pain  -Suspect musculoskeletal or from scar tissue from previous cardiothoracic surgery. Advised to discuss with his cardiologist next visit, but no skin changes or significant acute findings today. RTC  precautions if changes.  H/O dizziness  -Possible atypical migraine, but very infrequent. Reassuring exam, nonfocal, vital signs normal. So advised to discuss the symptoms with his cardiologist to see if other workup needed at this time. Follow-up with me if these persist, and could have neuro evaluate as well. rtc  precautions.  Hyperlipidemia with history of CAD  Continue Crestor, routine follow-up with cardiologist.  Discussed testosterone level, declined to have this tested at this time. He may not need to be on replacement, but would want to check two morning readings to make sure it is truly low before restarting replacement. Advised that this order is in the system if he would like to have this tested.  No orders of the defined types were placed in this encounter.   Patient Instructions  I would recommended a sleep study to look into snoring and wakening episodes further to make sure this is not sleep apnea. Let me know if you would like me to order this and I can do so without an office visit.   There is a future order in place for testosterone level if you want to have this rechecked at any point.   Schedule eye care provider visit at your convenience.   Discuss the chest stinging and area on upper chest with your cardiologist.  Can also discuss the dizziness sensations with him.  If these symptoms increase in frequency or worsen - follow up to discuss further.    Keeping you healthy  Get these tests  Blood pressure- Have your blood pressure checked once a year by your healthcare provider.  Normal blood pressure is 120/80  Weight- Have your body mass index (BMI) calculated to screen for obesity.  BMI is a measure of body fat based on height and weight. You can also calculate your own BMI at [email or web address].  Cholesterol- Have your cholesterol checked every year.  Diabetes- Have your blood sugar checked regularly if you have high blood pressure, high cholesterol,  have a family history of diabetes or if you are overweight.  Screening for Colon Cancer- Colonoscopy starting at age 58.  Screening may begin sooner depending on your family history and other health conditions. Follow up colonoscopy as directed by your Gastroenterologist.  Screening for Prostate Cancer- Both blood  work (PSA) and a rectal exam help screen for Prostate Cancer.  Screening begins at age 35 with African-American men and at age 64 with Caucasian men.  Screening may begin sooner depending on your family history.  Take these medicines  Aspirin- One aspirin daily can help prevent Heart disease and Stroke.  Flu shot- Every fall.  Tetanus- Every 10 years.  Zostavax- Once after the age of 43 to prevent Shingles.  Pneumonia shot- Once after the age of 62; if you are younger than 71, ask your healthcare provider if you need a Pneumonia shot.  Take these steps  Don't smoke- If you do smoke, talk to your doctor about quitting.  For tips on how to quit, go to www.smokefree.gov or call 1-800-QUIT-NOW.  Be physically active- Exercise 5 days a week for at least 30 minutes.  If you are not already physically active start slow and gradually work up to 30 minutes of moderate physical activity.  Examples of moderate activity include walking briskly, mowing the yard, dancing, swimming, bicycling, etc.  Eat a healthy diet- Eat a variety of healthy food such as fruits, vegetables, low fat milk, low fat cheese, yogurt, lean meant, poultry, fish, beans, tofu, etc. For more information go to www.thenutritionsource.org  Drink alcohol in moderation- Limit alcohol intake to less than two drinks a day. Never drink and drive.  Dentist- Brush and floss twice daily; visit your dentist twice a year.  Depression- Your emotional health is as important as your physical health. If you're feeling down, or losing interest in things you would normally enjoy please talk to your healthcare provider.  Eye exam-  Visit your eye doctor every year.  Safe sex- If you may be exposed to a sexually transmitted infection, use a condom.  Seat belts- Seat belts can save your life; always wear one.  Smoke/Carbon Monoxide detectors- These detectors need to be installed on the appropriate level of your home.  Replace batteries at least once a year.  Skin cancer- When out in the sun, cover up and use sunscreen 15 SPF or higher.  Violence- If anyone is threatening you, please tell your healthcare provider.  Living Will/ Health care power of attorney- Speak with your healthcare provider and family.      I personally performed the services described in this documentation, which was scribed in my presence. The recorded information has been reviewed and considered, and addended by me as needed.

## 2015-09-21 NOTE — Patient Instructions (Addendum)
I would recommended a sleep study to look into snoring and wakening episodes further to make sure this is not sleep apnea. Let me know if you would like me to order this and I can do so without an office visit.   There is a future order in place for testosterone level if you want to have this rechecked at any point.   Schedule eye care provider visit at your convenience.   Discuss the chest stinging and area on upper chest with your cardiologist.  Can also discuss the dizziness sensations with him.  If these symptoms increase in frequency or worsen - follow up to discuss further.    Keeping you healthy  Get these tests  Blood pressure- Have your blood pressure checked once a year by your healthcare provider.  Normal blood pressure is 120/80  Weight- Have your body mass index (BMI) calculated to screen for obesity.  BMI is a measure of body fat based on height and weight. You can also calculate your own BMI at [email or web address].  Cholesterol- Have your cholesterol checked every year.  Diabetes- Have your blood sugar checked regularly if you have high blood pressure, high cholesterol, have a family history of diabetes or if you are overweight.  Screening for Colon Cancer- Colonoscopy starting at age 82.  Screening may begin sooner depending on your family history and other health conditions. Follow up colonoscopy as directed by your Gastroenterologist.  Screening for Prostate Cancer- Both blood work (PSA) and a rectal exam help screen for Prostate Cancer.  Screening begins at age 11 with African-American men and at age 46 with Caucasian men.  Screening may begin sooner depending on your family history.  Take these medicines  Aspirin- One aspirin daily can help prevent Heart disease and Stroke.  Flu shot- Every fall.  Tetanus- Every 10 years.  Zostavax- Once after the age of 70 to prevent Shingles.  Pneumonia shot- Once after the age of 60; if you are younger than 71, ask your  healthcare provider if you need a Pneumonia shot.  Take these steps  Don't smoke- If you do smoke, talk to your doctor about quitting.  For tips on how to quit, go to www.smokefree.gov or call 1-800-QUIT-NOW.  Be physically active- Exercise 5 days a week for at least 30 minutes.  If you are not already physically active start slow and gradually work up to 30 minutes of moderate physical activity.  Examples of moderate activity include walking briskly, mowing the yard, dancing, swimming, bicycling, etc.  Eat a healthy diet- Eat a variety of healthy food such as fruits, vegetables, low fat milk, low fat cheese, yogurt, lean meant, poultry, fish, beans, tofu, etc. For more information go to www.thenutritionsource.org  Drink alcohol in moderation- Limit alcohol intake to less than two drinks a day. Never drink and drive.  Dentist- Brush and floss twice daily; visit your dentist twice a year.  Depression- Your emotional health is as important as your physical health. If you're feeling down, or losing interest in things you would normally enjoy please talk to your healthcare provider.  Eye exam- Visit your eye doctor every year.  Safe sex- If you may be exposed to a sexually transmitted infection, use a condom.  Seat belts- Seat belts can save your life; always wear one.  Smoke/Carbon Monoxide detectors- These detectors need to be installed on the appropriate level of your home.  Replace batteries at least once a year.  Skin cancer- When out in the sun,  cover up and use sunscreen 15 SPF or higher.  Violence- If anyone is threatening you, please tell your healthcare provider.  Living Will/ Health care power of attorney- Speak with your healthcare provider and family.

## 2015-10-04 ENCOUNTER — Other Ambulatory Visit (INDEPENDENT_AMBULATORY_CARE_PROVIDER_SITE_OTHER): Payer: BLUE CROSS/BLUE SHIELD

## 2015-10-04 DIAGNOSIS — E291 Testicular hypofunction: Secondary | ICD-10-CM

## 2015-10-04 DIAGNOSIS — R7989 Other specified abnormal findings of blood chemistry: Secondary | ICD-10-CM

## 2015-10-05 LAB — TESTOSTERONE: TESTOSTERONE: 164 ng/dL — AB (ref 250–827)

## 2015-10-11 ENCOUNTER — Encounter: Payer: Self-pay | Admitting: Family Medicine

## 2015-10-17 ENCOUNTER — Other Ambulatory Visit: Payer: Self-pay | Admitting: Family Medicine

## 2015-10-17 DIAGNOSIS — R7989 Other specified abnormal findings of blood chemistry: Secondary | ICD-10-CM

## 2015-10-19 ENCOUNTER — Other Ambulatory Visit (INDEPENDENT_AMBULATORY_CARE_PROVIDER_SITE_OTHER): Payer: BLUE CROSS/BLUE SHIELD

## 2015-10-19 DIAGNOSIS — E291 Testicular hypofunction: Secondary | ICD-10-CM | POA: Diagnosis not present

## 2015-10-19 DIAGNOSIS — R7989 Other specified abnormal findings of blood chemistry: Secondary | ICD-10-CM

## 2015-10-20 LAB — TESTOSTERONE: Testosterone: 181 ng/dL — ABNORMAL LOW (ref 250–827)

## 2015-10-24 ENCOUNTER — Encounter: Payer: Self-pay | Admitting: Family Medicine

## 2015-10-28 NOTE — Telephone Encounter (Signed)
Verified w/pt that he did use Testim in the past and it did work, but he prefers the Androgel. I completed covermymeds. PA pending.

## 2015-10-28 NOTE — Telephone Encounter (Signed)
PA approved through 08/12/2038. Notified pt on MyChart.

## 2015-11-02 ENCOUNTER — Other Ambulatory Visit: Payer: Self-pay

## 2015-11-02 NOTE — Telephone Encounter (Signed)
[facility] had faxed notice that there was still problem getting Androgel Rx to go through even though the PA was approved. Called [employer] again and they "fixed" the problem. Notified [facility] OK now.  Then got call from pt who stated that he wants me to send in the Rx to mail order where it will be much cheaper. He gave them my contact info, but I have not seen a request from them yet. Pt stated he will get me their info and call me back.

## 2015-11-02 NOTE — Telephone Encounter (Signed)
Pt wants [place] to send in a Rx for 90 day supplies of Androgel (generic) and asked me to call [employer] and give them OK. I called them and was going to call in RFs that Dr Carlota Raspberry had written for in Jan, but the pharm stated that the pt had discussed the 25 mg/2.5 GM packets. I called pt for clarification and he stated that he does not care what he gets as long as it is covered. Since I had done the PA for the 50 mg/5 GM to use QOD, that might be safer, but I didn't realize that it came in the lower strength. Dr Carlota Raspberry, I will pend the 50 mg as you originally Rxd, and also the 2.5 mg using it QD in case that would give pt a more even level. Please print and sign the one you wish to Rx and "remove" the other.

## 2015-11-04 MED ORDER — TESTOSTERONE 25 MG/2.5GM (1%) TD GEL: 2.5000 g | Freq: Every day | TRANSDERMAL | Status: DC

## 2015-11-04 NOTE — Telephone Encounter (Signed)
Can try the 25mg  dose initially, repeat testosterone levels in 3 months with PSA, CBC, CMP and lipid panel.  Last PSA 1.2 in June 2016.

## 2015-11-07 NOTE — Telephone Encounter (Signed)
Pt called and stated that [employer] can not get the Rx to go through, needs PA. I called [employer] [place] and was advised that the problem is that the PA was done for the 50 mg strength  QOD, and we sent the new Rx for 25 mg QD. I completed new PA on covermymeds for expedited review. Pending.

## 2015-11-07 NOTE — Telephone Encounter (Signed)
Advised pt by phone that I have faxed his Rx to [facility].

## 2015-11-08 NOTE — Telephone Encounter (Signed)
I have called [employer] to check why Rx still won't go through and updated pt on MyChart. See notes under Mychart message in this encounter.

## 2015-11-09 NOTE — Telephone Encounter (Signed)
PA approved for generic androgel 1%, 25 mg packets through 08/12/2038. Notified pt on mychart and by fax to [facility].

## 2015-11-11 NOTE — Telephone Encounter (Signed)
I called [facility] again, see notes under mychart message for details. Notified pt of status, but should get his Rx now.

## 2016-01-18 ENCOUNTER — Encounter: Payer: Self-pay | Admitting: Family Medicine

## 2016-02-01 ENCOUNTER — Telehealth: Payer: Self-pay | Admitting: *Deleted

## 2016-02-01 NOTE — Telephone Encounter (Signed)
Pt called back and due to 2 open heart surgeries he wants to keep GXT today at 11a, he feels he needs more than an office visit

## 2016-02-01 NOTE — Progress Notes (Signed)
Chief Complaint  Patient presents with  . Coronary Artery Disease    C/O worsening sob. Denies cp,lee,or claudication    History of Present Illness: 60 yo male with a h/o CAD, s/p CABG in 2002 and redo in January 2012 who is here today for cardiac follow up. He saw Dr. Verl Blalock in January 2012 with chest discomfort and was noted to have an abnormal Myoview. He was referred for cardiac catheterization by Dr. Percival Spanish. This demonstrated severe graft disease and native three-vessel CAD. He was referred for redo bypass. His LIMA to LAD remained patent. His grafts at his surgery on 09/01/10 with Dr. Roxan Hockey included a left radial-PDA and a free RIMA-ramus intermedius. Postoperatively he developed atrial fibrillation with rapid ventricular rate and was placed on amiodarone. He has maintained sinus since then and his amiodarone was stopped. Echo 06/15/13 with normal LVEF, no valve disease. Normal exercise stress test June 2016.   He is here today for follow up. He tells me that he has been doing well. No chest pain.   Primary Care Physician: Wendie Agreste, MD  Past Medical History  Diagnosis Date  . Coronary artery disease     s/p CABG 2002, redo 2012  . Atrial fibrillation ([place])     postoperative  . DJD (degenerative joint disease)   . History of ETT     a. ETT 6/16:  normal    Past Surgical History  Procedure Laterality Date  . Coronary artery bypass graft  2002  . Coronary artery bypass graft  08-2010    L-LAD remained from original CABG; new grafts incl L radial- PDA + RIMA-RI  . Vasectomy      Current Outpatient Prescriptions  Medication Sig Dispense Refill  . aspirin 325 MG tablet Take 325 mg by mouth daily.      Marland Kitchen atenolol (TENORMIN) 25 MG tablet Take 1 tablet (25 mg total) by mouth daily. 90 tablet 3  . furosemide (LASIX) 40 MG tablet Take 1 tablet (40 mg total) by mouth 2 (two) times daily. 180 tablet 3  . Multiple Vitamin (MULTIVITAMIN) tablet Take 1 tablet by mouth  daily.      . nitroGLYCERIN (NITROSTAT) 0.4 MG SL tablet Place 0.4 mg under the tongue every 5 (five) minutes as needed for chest pain. Reported on 09/21/2015    . rosuvastatin (CRESTOR) 40 MG tablet Take 40 mg by mouth daily.    . Testosterone (ANDROGEL) 25 MG/2.5GM (1%) GEL Place 2.5 g onto the skin daily. 90 Package 1  . ZETIA 10 MG tablet 1 TAB DAILY     No current facility-administered medications for this visit.    No Known Allergies  Social History   Social History  . Marital Status: Single    Spouse Name: N/A  . Number of Children: N/A  . Years of Education: N/A   Occupational History  . Realtor    Social History Main Topics  . Smoking status: Former Smoker    Quit date: 08/14/1999  . Smokeless tobacco: Not on file  . Alcohol Use: 4.2 oz/week    7 Standard drinks or equivalent per week  . Drug Use: No  . Sexual Activity: Not on file   Other Topics Concern  . Not on file   Social History Narrative   Single   Education: College   Exercise: Yes    Family History  Problem Relation Age of Onset  . Heart attack Father 22  . Hypertension Father   .  Cancer      family hx of  . Coronary artery disease      family hx of  . Hyperlipidemia      family hx of  . Stroke Neg Hx   . Hypertension Paternal Grandfather   . Cancer Mother     Breast cancer    Review of Systems:  As stated in the HPI and otherwise negative.   BP 132/80 mmHg  Pulse 58  Ht '5\' 8"'$  (1.727 m)  Wt 177 lb (80.287 kg)  BMI 26.92 kg/m2  Physical Examination: General: Well developed, well nourished, NAD HEENT: OP clear, mucus membranes moist SKIN: warm, dry. No rashes. Neuro: No focal deficits Musculoskeletal: Muscle strength 5/5 all ext Psychiatric: Mood and affect normal Neck: No JVD, no carotid bruits, no thyromegaly, no lymphadenopathy. Lungs:Clear bilaterally, no wheezes, rhonci, crackles Cardiovascular: Regular rate and rhythm. No murmurs, gallops or rubs. Abdomen:Soft. Bowel  sounds present. Non-tender.  Extremities: No lower extremity edema. Pulses are 2 + in the bilateral DP/PT.  Echo 06/15/13: Left ventricle: The cavity size was normal. Wall thickness was increased in a pattern of mild LVH. Systolic function was normal. The estimated ejection fraction was in the range of 60% to 65%.  EKG:  EKG is ordered today. The ekg ordered today demonstrates Sinus brady, rate 58 bpm.   Recent Labs: 07/20/2015: ALT 34; BUN 20; Creat 1.22; Potassium 5.2; Sodium 140   Lipid Panel    Component Value Date/Time   CHOL 116* 07/20/2015 0921   TRIG 99 07/20/2015 0921   HDL 28* 07/20/2015 0921   CHOLHDL 4.1 07/20/2015 0921   VLDL 20 07/20/2015 0921   LDLCALC 68 07/20/2015 0921     Wt Readings from Last 3 Encounters:  02/02/16 177 lb (80.287 kg)  09/21/15 174 lb 12.8 oz (79.289 kg)  06/27/15 178 lb 12.8 oz (81.103 kg)     Other studies Reviewed: Additional studies/ records that were reviewed today include: . Review of the above records demonstrates:    Assessment and Plan:   1. CAD: s/p redo CABG January 2012.  Stable. Continue current meds. No changes today.   2. Post-operative atrial fibrillation: No recurrence. Sinus today. Continue beta blocker.   3. Chronic diastolic CHF: Volume stable. Continue Lasix 40 mg po BID. Check BMET  4. Hyperlipidemia: Will continue statin. He has stopped Zetia. Will check lipids and LFTs  Current medicines are reviewed at length with the patient today.  The patient does not have concerns regarding medicines.  The following changes have been made:  no change  Labs/ tests ordered today include:   Orders Placed This Encounter  Procedures  . Comp Met (CMET)  . Lipid Profile  . EKG 12-Lead     Disposition:   FU with me in 12  months   Signed, Lauree Chandler, MD 02/02/2016 9:42 AM    [facility] [address], [place], [place]  [address] Phone: [phone]; Fax: [phone]

## 2016-02-01 NOTE — Telephone Encounter (Signed)
I placed call to pt regarding GXT scheduled for today. Pt is not to have GXT but to have one year follow up with Dr. Angelena Form. Left message to call back.

## 2016-02-01 NOTE — Telephone Encounter (Signed)
Spoke with pt and explained to him why he needed to see Dr. Angelena Form prior to having GXT. Pt reports he feels congestion is getting worse as he has to take fluid pill more often.  Appt made for pt to see Dr. Angelena Form tomorrow at 8:30. Treadmill cancelled.

## 2016-02-02 ENCOUNTER — Ambulatory Visit (INDEPENDENT_AMBULATORY_CARE_PROVIDER_SITE_OTHER): Payer: BLUE CROSS/BLUE SHIELD | Admitting: Cardiovascular Disease

## 2016-02-02 VITALS — BP 132/80 | HR 58 | Ht 68.0 in | Wt 177.0 lb

## 2016-02-02 DIAGNOSIS — I5032 Chronic diastolic (congestive) heart failure: Secondary | ICD-10-CM

## 2016-02-02 DIAGNOSIS — I2510 Atherosclerotic heart disease of native coronary artery without angina pectoris: Secondary | ICD-10-CM

## 2016-02-02 DIAGNOSIS — I480 Paroxysmal atrial fibrillation: Secondary | ICD-10-CM

## 2016-02-02 DIAGNOSIS — E785 Hyperlipidemia, unspecified: Secondary | ICD-10-CM

## 2016-02-02 MED ORDER — ATENOLOL 25 MG PO TABS: 25.0000 mg | ORAL_TABLET | Freq: Every day | ORAL | Status: DC

## 2016-02-02 MED ORDER — FUROSEMIDE 40 MG PO TABS: 40.0000 mg | ORAL_TABLET | Freq: Two times a day (BID) | ORAL | Status: DC

## 2016-02-02 NOTE — Patient Instructions (Signed)
Medication Instructions:  Your physician recommends that you continue on your current medications as directed. Please refer to the Current Medication list given to you today.   Labwork: Your physician recommends that you return for lab work tomorrow--CMET, Lipid profile. The lab opens at 7:30    Testing/Procedures: none  Follow-Up: Your physician wants you to follow-up in: 12 months.  You will receive a reminder letter in the mail two months in advance. If you don't receive a letter, please call our office to schedule the follow-up appointment.   Any Other Special Instructions Will Be Listed Below (If Applicable).     If you need a refill on your cardiac medications before your next appointment, please call your pharmacy.

## 2016-02-03 ENCOUNTER — Other Ambulatory Visit (INDEPENDENT_AMBULATORY_CARE_PROVIDER_SITE_OTHER): Payer: BLUE CROSS/BLUE SHIELD | Admitting: *Deleted

## 2016-02-03 ENCOUNTER — Other Ambulatory Visit: Payer: Self-pay | Admitting: *Deleted

## 2016-02-03 DIAGNOSIS — E785 Hyperlipidemia, unspecified: Secondary | ICD-10-CM

## 2016-02-03 DIAGNOSIS — I5032 Chronic diastolic (congestive) heart failure: Secondary | ICD-10-CM

## 2016-02-03 DIAGNOSIS — I2510 Atherosclerotic heart disease of native coronary artery without angina pectoris: Secondary | ICD-10-CM

## 2016-02-03 LAB — LIPID PANEL
CHOL/HDL RATIO: 3.7 ratio (ref <=5.0)
Cholesterol: 119 mg/dL — ABNORMAL LOW (ref 125–200)
HDL: 32 mg/dL — ABNORMAL LOW (ref >=40)
LDL CALC: 66 mg/dL (ref <130)
Triglycerides: 107 mg/dL (ref <150)
VLDL: 21 mg/dL (ref <30)

## 2016-02-03 LAB — COMPREHENSIVE METABOLIC PANEL
ALT: 21 U/L (ref 9–46)
AST: 21 U/L (ref 10–35)
Albumin: 4.6 g/dL (ref 3.6–5.1)
Alkaline Phosphatase: 54 U/L (ref 40–115)
BUN: 23 mg/dL (ref 7–25)
CALCIUM: 9.2 mg/dL (ref 8.6–10.3)
CO2: 30 mmol/L (ref 20–31)
Chloride: 98 mmol/L (ref 98–110)
Creat: 1.37 mg/dL — ABNORMAL HIGH (ref 0.70–1.33)
GLUCOSE: 105 mg/dL — AB (ref 65–99)
POTASSIUM: 4.2 mmol/L (ref 3.5–5.3)
Sodium: 138 mmol/L (ref 135–146)
Total Bilirubin: 0.4 mg/dL (ref 0.2–1.2)
Total Protein: 7.1 g/dL (ref 6.1–8.1)

## 2016-02-03 MED ORDER — FUROSEMIDE 40 MG PO TABS
ORAL_TABLET | ORAL | Status: DC
Start: 2016-02-03 — End: 2017-03-07

## 2016-02-03 NOTE — Addendum Note (Signed)
Addended by: Eulis Foster on: 02/03/2016 08:39 AM   Modules accepted: Orders

## 2016-02-10 ENCOUNTER — Other Ambulatory Visit (INDEPENDENT_AMBULATORY_CARE_PROVIDER_SITE_OTHER): Payer: BLUE CROSS/BLUE SHIELD | Admitting: *Deleted

## 2016-02-10 DIAGNOSIS — I5032 Chronic diastolic (congestive) heart failure: Secondary | ICD-10-CM | POA: Diagnosis not present

## 2016-02-10 LAB — BASIC METABOLIC PANEL
BUN: 22 mg/dL (ref 7–25)
CO2: 26 mmol/L (ref 20–31)
CREATININE: 1.14 mg/dL (ref 0.70–1.33)
Calcium: 8.9 mg/dL (ref 8.6–10.3)
Chloride: 103 mmol/L (ref 98–110)
GLUCOSE: 112 mg/dL — AB (ref 65–99)
Potassium: 4.3 mmol/L (ref 3.5–5.3)
Sodium: 140 mmol/L (ref 135–146)

## 2016-02-27 ENCOUNTER — Encounter: Payer: Self-pay | Admitting: Family Medicine

## 2016-02-28 ENCOUNTER — Encounter: Payer: Self-pay | Admitting: Cardiovascular Disease

## 2016-02-29 ENCOUNTER — Other Ambulatory Visit: Payer: Self-pay | Admitting: *Deleted

## 2016-02-29 MED ORDER — ROSUVASTATIN CALCIUM 40 MG PO TABS: 40.0000 mg | ORAL_TABLET | Freq: Every day | ORAL | Status: DC

## 2016-03-08 ENCOUNTER — Ambulatory Visit (INDEPENDENT_AMBULATORY_CARE_PROVIDER_SITE_OTHER): Payer: BLUE CROSS/BLUE SHIELD | Admitting: Physician Assistant

## 2016-03-08 ENCOUNTER — Ambulatory Visit (INDEPENDENT_AMBULATORY_CARE_PROVIDER_SITE_OTHER): Payer: BLUE CROSS/BLUE SHIELD

## 2016-03-08 VITALS — BP 120/84 | HR 76 | Temp 98.0°F | Resp 18 | Ht 68.0 in | Wt 185.0 lb

## 2016-03-08 DIAGNOSIS — M79675 Pain in left toe(s): Secondary | ICD-10-CM | POA: Diagnosis not present

## 2016-03-08 DIAGNOSIS — M7989 Other specified soft tissue disorders: Secondary | ICD-10-CM

## 2016-03-08 DIAGNOSIS — M79672 Pain in left foot: Secondary | ICD-10-CM

## 2016-03-08 DIAGNOSIS — M109 Gout, unspecified: Secondary | ICD-10-CM

## 2016-03-08 LAB — URIC ACID: Uric Acid, Serum: 7.5 mg/dL (ref 4.0–8.0)

## 2016-03-08 MED ORDER — TRAMADOL HCL 50 MG PO TABS: 50.0000 mg | ORAL_TABLET | Freq: Three times a day (TID) | ORAL | 0 refills | Status: DC | PRN

## 2016-03-08 MED ORDER — COLCHICINE 0.6 MG PO TABS: ORAL_TABLET | ORAL | 1 refills | Status: DC

## 2016-03-08 NOTE — Patient Instructions (Addendum)
IF you received an x-ray today, you will receive an invoice from [facility]. Please contact [facility] at [phone] with questions or concerns regarding your invoice.   IF you received labwork today, you will receive an invoice from [employer]. Please contact [facility] at [phone] with questions or concerns regarding your invoice.   Our billing staff will not be able to assist you with questions regarding bills from these companies.  You will be contacted with the lab results as soon as they are available. The fastest way to get your results is to activate your My Chart account. Instructions are located on the last page of this paperwork. If you have not heard from [place] regarding the results in 2 weeks, please contact this office.     Hydrate well with water. I would like you to take the colchicine as prescribed.  If you continue to have the pain you can do 500 naproxen twice per day for 5 days.  But if the pain is not improving-- you must return.  Gout Gout is an inflammatory arthritis caused by a buildup of uric acid crystals in the joints. Uric acid is a chemical that is normally present in the blood. When the level of uric acid in the blood is too high it can form crystals that deposit in your joints and tissues. This causes joint redness, soreness, and swelling (inflammation). Repeat attacks are common. Over time, uric acid crystals can form into masses (tophi) near a joint, destroying bone and causing disfigurement. Gout is treatable and often preventable. CAUSES  The disease begins with elevated levels of uric acid in the blood. Uric acid is produced by your body when it breaks down a naturally found substance called purines. Certain foods you eat, such as meats and fish, contain high amounts of purines. Causes of an elevated uric acid level include:  Being passed down from parent to child (heredity).  Diseases that cause increased  uric acid production (such as obesity, psoriasis, and certain cancers).  Excessive alcohol use.  Diet, especially diets rich in meat and seafood.  Medicines, including certain cancer-fighting medicines (chemotherapy), water pills (diuretics), and aspirin.  Chronic kidney disease. The kidneys are no longer able to remove uric acid well.  Problems with metabolism. Conditions strongly associated with gout include:  Obesity.  High blood pressure.  High cholesterol.  Diabetes. Not everyone with elevated uric acid levels gets gout. It is not understood why some people get gout and others do not. Surgery, joint injury, and eating too much of certain foods are some of the factors that can lead to gout attacks. SYMPTOMS   An attack of gout comes on quickly. It causes intense pain with redness, swelling, and warmth in a joint.  Fever can occur.  Often, only one joint is involved. Certain joints are more commonly involved:  Base of the big toe.  Knee.  Ankle.  Wrist.  Finger. Without treatment, an attack usually goes away in a few days to weeks. Between attacks, you usually will not have symptoms, which is different from many other forms of arthritis. DIAGNOSIS  Your caregiver will suspect gout based on your symptoms and exam. In some cases, tests may be recommended. The tests may include:  Blood tests.  Urine tests.  X-rays.  Joint fluid exam. This exam requires a needle to remove fluid from the joint (arthrocentesis). Using a microscope, gout is confirmed when uric acid crystals are seen in the joint fluid.  TREATMENT  There are two phases to gout treatment: treating the sudden onset (acute) attack and preventing attacks (prophylaxis).  Treatment of an Acute Attack.  Medicines are used. These include anti-inflammatory medicines or steroid medicines.  An injection of steroid medicine into the affected joint is sometimes necessary.  The painful joint is rested. Movement  can worsen the arthritis.  You may use warm or cold treatments on painful joints, depending which works best for you.  Treatment to Prevent Attacks.  If you suffer from frequent gout attacks, your caregiver may advise preventive medicine. These medicines are started after the acute attack subsides. These medicines either help your kidneys eliminate uric acid from your body or decrease your uric acid production. You may need to stay on these medicines for a very long time.  The early phase of treatment with preventive medicine can be associated with an increase in acute gout attacks. For this reason, during the first few months of treatment, your caregiver may also advise you to take medicines usually used for acute gout treatment. Be sure you understand your caregiver's directions. Your caregiver may make several adjustments to your medicine dose before these medicines are effective.  Discuss dietary treatment with your caregiver or dietitian. Alcohol and drinks high in sugar and fructose and foods such as meat, poultry, and seafood can increase uric acid levels. Your caregiver or dietitian can advise you on drinks and foods that should be limited. HOME CARE INSTRUCTIONS   Do not take aspirin to relieve pain. This raises uric acid levels.  Only take over-the-counter or prescription medicines for pain, discomfort, or fever as directed by your caregiver.  Rest the joint as much as possible. When in bed, keep sheets and blankets off painful areas.  Keep the affected joint raised (elevated).  Apply warm or cold treatments to painful joints. Use of warm or cold treatments depends on which works best for you.  Use crutches if the painful joint is in your leg.  Drink enough fluids to keep your urine clear or pale yellow. This helps your body get rid of uric acid. Limit alcohol, sugary drinks, and fructose drinks.  Follow your dietary instructions. Pay careful attention to the amount of protein  you eat. Your daily diet should emphasize fruits, vegetables, whole grains, and fat-free or low-fat milk products. Discuss the use of coffee, vitamin C, and cherries with your caregiver or dietitian. These may be helpful in lowering uric acid levels.  Maintain a healthy body weight. SEEK MEDICAL CARE IF:   You develop diarrhea, vomiting, or any side effects from medicines.  You do not feel better in 24 hours, or you are getting worse. SEEK IMMEDIATE MEDICAL CARE IF:   Your joint becomes suddenly more tender, and you have chills or a fever. MAKE SURE YOU:   Understand these instructions.  Will watch your condition.  Will get help right away if you are not doing well or get worse.   This information is not intended to replace advice given to you by your health care provider. Make sure you discuss any questions you have with your health care provider.   Document Released: 07/27/2000 Document Revised: 08/20/2014 Document Reviewed: 03/12/2012 Elsevier Interactive Patient Education [employer].

## 2016-03-08 NOTE — Progress Notes (Signed)
[facility] [address], [place] [address] [phone]- [phone]  Date:  03/08/2016   Name:  Jeremy Hendricks   DOB:  September 09, 1955   MRN:  [id number]  PCP:  Wendie Agreste, MD    History of Present Illness:  Jeremy Hendricks is a 60 y.o. male patient who presents to [facility] for great toe pain.   --great toe left intensely throbbing pain.  He recalls hitting the toe againt. He will have hours of relief and then it will restart.  He has been taking ibuprofen.   --He had an oxycodone which did not help much. --he has had 2 flare ups of gout.   --On vacation for a couple weeks, where he was drinking a lot of wine.    Patient Active Problem List   Diagnosis Date Noted  . Hypogonadism in male 09/09/2015  . INSOMNIA 09/19/2010  . SHORTNESS OF BREATH 09/19/2010  . CHEST PAIN-PRECORDIAL 08/23/2010  . CAD, ARTERY BYPASS GRAFT 10/26/2009  . HYPERLIPIDEMIA 10/24/2009  . DEGENERATIVE JOINT DISEASE 10/24/2009  . ANGINA, HX OF 10/24/2009    Past Medical History:  Diagnosis Date  . Atrial fibrillation ([place])    postoperative  . Coronary artery disease    s/p CABG 2002, redo 2012  . DJD (degenerative joint disease)   . History of ETT    a. ETT 6/16:  normal    Past Surgical History:  Procedure Laterality Date  . CORONARY ARTERY BYPASS GRAFT  2002  . CORONARY ARTERY BYPASS GRAFT  08-2010   L-LAD remained from original CABG; new grafts incl L radial- PDA + RIMA-RI  . VASECTOMY      Social History  Substance Use Topics  . Smoking status: Former Smoker    Quit date: 08/14/1999  . Smokeless tobacco: Not on file  . Alcohol use 4.2 oz/week    7 Standard drinks or equivalent per week    Family History  Problem Relation Age of Onset  . Heart attack Father 41  . Hypertension Father   . Cancer      family hx of  . Coronary artery disease      family hx of  . Hyperlipidemia      family hx of  . Stroke Neg Hx   . Hypertension Paternal Grandfather   . Cancer Mother    Breast cancer    No Known Allergies  Medication list has been reviewed and updated.  Current Outpatient Prescriptions on File Prior to Visit  Medication Sig Dispense Refill  . aspirin 325 MG tablet Take 325 mg by mouth daily.      Marland Kitchen atenolol (TENORMIN) 25 MG tablet Take 1 tablet (25 mg total) by mouth daily. 90 tablet 3  . furosemide (LASIX) 40 MG tablet take  40 mg one day alternating with BID the next day 135 tablet 3  . Multiple Vitamin (MULTIVITAMIN) tablet Take 1 tablet by mouth daily.      . nitroGLYCERIN (NITROSTAT) 0.4 MG SL tablet Place 0.4 mg under the tongue every 5 (five) minutes as needed for chest pain. Reported on 09/21/2015    . rosuvastatin (CRESTOR) 40 MG tablet Take 1 tablet (40 mg total) by mouth daily. 90 tablet 3  . Testosterone (ANDROGEL) 25 MG/2.5GM (1%) GEL Place 2.5 g onto the skin daily. 90 Package 1   No current facility-administered medications on file prior to visit.     ROS ROS otherwise unremarkable unless listed above.   Physical Examination:  BP 120/84 (BP Location: Right Arm, Patient Position: Sitting, Cuff Size: Normal)   Pulse 76   Temp 98 F (36.7 C) (Oral)   Resp 18   Ht 5\' 8"  (1.727 m)   Wt 185 lb (83.9 kg)   SpO2 97%   BMI 28.13 kg/m  Ideal Body Weight: Weight in (lb) to have BMI = 25: 164.1  Physical Exam  Constitutional: He is oriented to person, place, and time. He appears well-developed and well-nourished. No distress.  HENT:  Head: Normocephalic and atraumatic.  Eyes: Conjunctivae and EOM are normal. Pupils are equal, round, and reactive to light.  Cardiovascular: Normal rate.   Pulses:      Dorsalis pedis pulses are 2+ on the right side, and 2+ on the left side.  Pulmonary/Chest: Effort normal. No respiratory distress.  Neurological: He is alert and oriented to person, place, and time.  Skin: Skin is warm and dry. He is not diaphoretic.  Psychiatric: He has a normal mood and affect. His behavior is normal.   Dg Toe Great  Left  Result Date: 03/08/2016 CLINICAL DATA:  Left great toe pain and swelling for a few days. No injury. EXAM: LEFT GREAT TOE COMPARISON:  None. FINDINGS: There is no evidence of fracture or dislocation. There is no evidence of arthropathy or other focal bone abnormality. Soft tissues are unremarkable. IMPRESSION: Negative. Electronically Signed   By: Marin Olp M.D.   On: 03/08/2016 16:40  Assessment and Plan: Jeremy Hendricks is a 60 y.o. male who is here today for cc of great toe pain.   This appears to be gouty flare moreso then infection.  Will do the colchicine at this time.   rtc as needed.   Great toe pain, left - Plan: DG Toe Great Left, Uric Acid  Pain and swelling of toe of left foot - Plan: DG Toe Great Left, Uric Acid  Acute gout, unspecified cause, unspecified site - Plan: colchicine 0.6 MG tablet  Ivar Drape, PA-C [facility] 8/17/20177:57 AM  Ivar Drape, PA-C [facility] 03/08/2016 3:37 PM

## 2016-03-09 ENCOUNTER — Encounter: Payer: Self-pay | Admitting: Family Medicine

## 2016-03-12 ENCOUNTER — Ambulatory Visit (INDEPENDENT_AMBULATORY_CARE_PROVIDER_SITE_OTHER): Payer: BLUE CROSS/BLUE SHIELD | Admitting: Physician Assistant

## 2016-03-12 ENCOUNTER — Encounter: Payer: Self-pay | Admitting: Physician Assistant

## 2016-03-12 VITALS — BP 120/70 | HR 62 | Temp 98.3°F | Resp 16 | Ht 68.0 in | Wt 182.4 lb

## 2016-03-12 DIAGNOSIS — M109 Gout, unspecified: Secondary | ICD-10-CM | POA: Diagnosis not present

## 2016-03-12 DIAGNOSIS — Z79899 Other long term (current) drug therapy: Secondary | ICD-10-CM

## 2016-03-12 LAB — POCT CBC
GRANULOCYTE PERCENT: 64.4 % (ref 37–80)
HCT, POC: 39.4 % — AB (ref 43.5–53.7)
HEMOGLOBIN: 14.3 g/dL (ref 14.1–18.1)
Lymph, poc: 1.7 (ref 0.6–3.4)
MCH: 31.5 pg — AB (ref 27–31.2)
MCHC: 36.3 g/dL — AB (ref 31.8–35.4)
MCV: 87 fL (ref 80–97)
MID (cbc): 1 — AB (ref 0–0.9)
MPV: 7.2 fL (ref 0–99.8)
PLATELET COUNT, POC: 255 10*3/uL (ref 142–424)
POC Granulocyte: 5 (ref 2–6.9)
POC LYMPH PERCENT: 22.5 %L (ref 10–50)
POC MID %: 13.1 % — AB (ref 0–12)
RBC: 4.54 M/uL — AB (ref 4.69–6.13)
RDW, POC: 12.2 %
WBC: 7.7 10*3/uL (ref 4.6–10.2)

## 2016-03-12 LAB — GLUCOSE, POCT (MANUAL RESULT ENTRY): POC Glucose: 91 mg/dl (ref 70–99)

## 2016-03-12 MED ORDER — TRAMADOL HCL 50 MG PO TABS: 50.0000 mg | ORAL_TABLET | Freq: Three times a day (TID) | ORAL | 0 refills | Status: DC | PRN

## 2016-03-12 MED ORDER — PREDNISONE 20 MG PO TABS: ORAL_TABLET | ORAL | 0 refills | Status: DC

## 2016-03-12 NOTE — Patient Instructions (Addendum)
IF you received an x-ray today, you will receive an invoice from [facility]. Please contact [facility] at [phone] with questions or concerns regarding your invoice.   IF you received labwork today, you will receive an invoice from [employer]. Please contact [facility] at [phone] with questions or concerns regarding your invoice.   Our billing staff will not be able to assist you with questions regarding bills from these companies.  You will be contacted with the lab results as soon as they are available. The fastest way to get your results is to activate your My Chart account. Instructions are located on the last page of this paperwork. If you have not heard from [place] regarding the results in 2 weeks, please contact this office.    Please take the steroid as prescribed.  If you are not having improvement in the 4-5 days, please let us know.  Take at breakfast.    Gout Gout is an inflammatory arthritis caused by a buildup of uric acid crystals in the joints. Uric acid is a chemical that is normally present in the blood. When the level of uric acid in the blood is too high it can form crystals that deposit in your joints and tissues. This causes joint redness, soreness, and swelling (inflammation). Repeat attacks are common. Over time, uric acid crystals can form into masses (tophi) near a joint, destroying bone and causing disfigurement. Gout is treatable and often preventable. CAUSES  The disease begins with elevated levels of uric acid in the blood. Uric acid is produced by your body when it breaks down a naturally found substance called purines. Certain foods you eat, such as meats and fish, contain high amounts of purines. Causes of an elevated uric acid level include:  Being passed down from parent to child (heredity).  Diseases that cause increased uric acid production (such as obesity, psoriasis, and certain cancers).  Excessive  alcohol use.  Diet, especially diets rich in meat and seafood.  Medicines, including certain cancer-fighting medicines (chemotherapy), water pills (diuretics), and aspirin.  Chronic kidney disease. The kidneys are no longer able to remove uric acid well.  Problems with metabolism. Conditions strongly associated with gout include:  Obesity.  High blood pressure.  High cholesterol.  Diabetes. Not everyone with elevated uric acid levels gets gout. It is not understood why some people get gout and others do not. Surgery, joint injury, and eating too much of certain foods are some of the factors that can lead to gout attacks. SYMPTOMS   An attack of gout comes on quickly. It causes intense pain with redness, swelling, and warmth in a joint.  Fever can occur.  Often, only one joint is involved. Certain joints are more commonly involved:  Base of the big toe.  Knee.  Ankle.  Wrist.  Finger. Without treatment, an attack usually goes away in a few days to weeks. Between attacks, you usually will not have symptoms, which is different from many other forms of arthritis. DIAGNOSIS  Your caregiver will suspect gout based on your symptoms and exam. In some cases, tests may be recommended. The tests may include:  Blood tests.  Urine tests.  X-rays.  Joint fluid exam. This exam requires a needle to remove fluid from the joint (arthrocentesis). Using a microscope, gout is confirmed when uric acid crystals are seen in the joint fluid. TREATMENT  There are two phases to gout treatment: treating the sudden onset (acute) attack and preventing attacks (  prophylaxis).  Treatment of an Acute Attack.  Medicines are used. These include anti-inflammatory medicines or steroid medicines.  An injection of steroid medicine into the affected joint is sometimes necessary.  The painful joint is rested. Movement can worsen the arthritis.  You may use warm or cold treatments on painful joints,  depending which works best for you.  Treatment to Prevent Attacks.  If you suffer from frequent gout attacks, your caregiver may advise preventive medicine. These medicines are started after the acute attack subsides. These medicines either help your kidneys eliminate uric acid from your body or decrease your uric acid production. You may need to stay on these medicines for a very long time.  The early phase of treatment with preventive medicine can be associated with an increase in acute gout attacks. For this reason, during the first few months of treatment, your caregiver may also advise you to take medicines usually used for acute gout treatment. Be sure you understand your caregiver's directions. Your caregiver may make several adjustments to your medicine dose before these medicines are effective.  Discuss dietary treatment with your caregiver or dietitian. Alcohol and drinks high in sugar and fructose and foods such as meat, poultry, and seafood can increase uric acid levels. Your caregiver or dietitian can advise you on drinks and foods that should be limited. HOME CARE INSTRUCTIONS   Do not take aspirin to relieve pain. This raises uric acid levels.  Only take over-the-counter or prescription medicines for pain, discomfort, or fever as directed by your caregiver.  Rest the joint as much as possible. When in bed, keep sheets and blankets off painful areas.  Keep the affected joint raised (elevated).  Apply warm or cold treatments to painful joints. Use of warm or cold treatments depends on which works best for you.  Use crutches if the painful joint is in your leg.  Drink enough fluids to keep your urine clear or pale yellow. This helps your body get rid of uric acid. Limit alcohol, sugary drinks, and fructose drinks.  Follow your dietary instructions. Pay careful attention to the amount of protein you eat. Your daily diet should emphasize fruits, vegetables, whole grains, and  fat-free or low-fat milk products. Discuss the use of coffee, vitamin C, and cherries with your caregiver or dietitian. These may be helpful in lowering uric acid levels.  Maintain a healthy body weight. SEEK MEDICAL CARE IF:   You develop diarrhea, vomiting, or any side effects from medicines.  You do not feel better in 24 hours, or you are getting worse. SEEK IMMEDIATE MEDICAL CARE IF:   Your joint becomes suddenly more tender, and you have chills or a fever. MAKE SURE YOU:   Understand these instructions.  Will watch your condition.  Will get help right away if you are not doing well or get worse.   This information is not intended to replace advice given to you by your health care provider. Make sure you discuss any questions you have with your health care provider.   Document Released: 07/27/2000 Document Revised: 08/20/2014 Document Reviewed: 03/12/2012 Elsevier Interactive Patient Education [employer].

## 2016-03-12 NOTE — Progress Notes (Signed)
[facility] [address] [place] [address] [phone]  Date:  03/12/2016   Name:  Jeremy Hendricks   DOB:  1956/02/24   MRN:  [id number]  PCP:  Wendie Agreste, MD    History of Present Illness:  Jeremy Hendricks is a 60 y.o. male patient who presents to [facility] for follow up. Patient reports consistent pain of his left toe.  He has attempted the colchicine which helped after 15 hours, he started to have relief, but the pain has returned.  He has not tried the naproxen as discussed verbally and on avs summary.  He has not applied any thermotherapy.  Hard to ambulate.  No fever or malaise.      Patient Active Problem List   Diagnosis Date Noted  . Hypogonadism in male 09/09/2015  . INSOMNIA 09/19/2010  . SHORTNESS OF BREATH 09/19/2010  . CHEST PAIN-PRECORDIAL 08/23/2010  . CAD, ARTERY BYPASS GRAFT 10/26/2009  . HYPERLIPIDEMIA 10/24/2009  . DEGENERATIVE JOINT DISEASE 10/24/2009  . ANGINA, HX OF 10/24/2009    Past Medical History:  Diagnosis Date  . Atrial fibrillation ([place])    postoperative  . Coronary artery disease    s/p CABG 2002, redo 2012  . DJD (degenerative joint disease)   . History of ETT    a. ETT 6/16:  normal    Past Surgical History:  Procedure Laterality Date  . CORONARY ARTERY BYPASS GRAFT  2002  . CORONARY ARTERY BYPASS GRAFT  08-2010   L-LAD remained from original CABG; new grafts incl L radial- PDA + RIMA-RI  . VASECTOMY      Social History  Substance Use Topics  . Smoking status: Former Smoker    Quit date: 08/14/1999  . Smokeless tobacco: Not on file  . Alcohol use 4.2 oz/week    7 Standard drinks or equivalent per week    Family History  Problem Relation Age of Onset  . Heart attack Father 81  . Hypertension Father   . Cancer      family hx of  . Coronary artery disease      family hx of  . Hyperlipidemia      family hx of  . Stroke Neg Hx   . Hypertension Paternal Grandfather   . Cancer Mother     Breast  cancer    No Known Allergies  Medication list has been reviewed and updated.  Current Outpatient Prescriptions on File Prior to Visit  Medication Sig Dispense Refill  . aspirin 325 MG tablet Take 325 mg by mouth daily.      Marland Kitchen atenolol (TENORMIN) 25 MG tablet Take 1 tablet (25 mg total) by mouth daily. 90 tablet 3  . furosemide (LASIX) 40 MG tablet take  40 mg one day alternating with BID the next day 135 tablet 3  . Multiple Vitamin (MULTIVITAMIN) tablet Take 1 tablet by mouth daily.      . nitroGLYCERIN (NITROSTAT) 0.4 MG SL tablet Place 0.4 mg under the tongue every 5 (five) minutes as needed for chest pain. Reported on 09/21/2015    . rosuvastatin (CRESTOR) 40 MG tablet Take 1 tablet (40 mg total) by mouth daily. 90 tablet 3  . Testosterone (ANDROGEL) 25 MG/2.5GM (1%) GEL Place 2.5 g onto the skin daily. 90 Package 1  . traMADol (ULTRAM) 50 MG tablet Take 1-2 tablets (50-100 mg total) by mouth every 8 (eight) hours as needed. 20 tablet 0  . colchicine 0.6 MG tablet Take  2 tablets by mouth then take 1 tablet in 1 hour (Patient not taking: Reported on 03/12/2016) 3 tablet 1   No current facility-administered medications on file prior to visit.     ROS ROS otherwise unremarkable unless listed above.   Physical Examination: BP 120/70 (BP Location: Right Arm, Patient Position: Sitting, Cuff Size: Normal)   Pulse 62   Temp 98.3 F (36.8 C) (Oral)   Resp 16   Ht 5\' 8"  (1.727 m)   Wt 182 lb 6.4 oz (82.7 kg)   SpO2 98%   BMI 27.73 kg/m  Ideal Body Weight: Weight in (lb) to have BMI = 25: 164.1  Physical Exam  Constitutional: He is oriented to person, place, and time. He appears well-developed and well-nourished. No distress.  HENT:  Head: Normocephalic and atraumatic.  Eyes: Conjunctivae and EOM are normal. Pupils are equal, round, and reactive to light.  Cardiovascular: Normal rate.   Pulmonary/Chest: Effort normal. No respiratory distress.  Musculoskeletal:  Left foot swelling  of 1st metatarsal with mild erythema.  There is tenderness along the metatarsal joint, and moreso exquisitely tender at the lateral MTP joint.  rom limited with flexion and extension, and decreased with passive secondary to pain of that MTPjoint.  Neurological: He is alert and oriented to person, place, and time.  Skin: Skin is warm and dry. He is not diaphoretic.  Psychiatric: He has a normal mood and affect. His behavior is normal.    Results for orders placed or performed in visit on 03/12/16  POCT glucose (manual entry)  Result Value Ref Range   POC Glucose 91 70 - 99 mg/dl  POCT CBC  Result Value Ref Range   WBC 7.7 4.6 - 10.2 K/uL   Lymph, poc 1.7 0.6 - 3.4   POC LYMPH PERCENT 22.5 10 - 50 %L   MID (cbc) 1.0 (A) 0 - 0.9   POC MID % 13.1 (A) 0 - 12 %M   POC Granulocyte 5.0 2 - 6.9   Granulocyte percent 64.4 37 - 80 %G   RBC 4.54 (A) 4.69 - 6.13 M/uL   Hemoglobin 14.3 14.1 - 18.1 g/dL   HCT, POC 39.4 (A) 43.5 - 53.7 %   MCV 87.0 80 - 97 fL   MCH, POC 31.5 (A) 27 - 31.2 pg   MCHC 36.3 (A) 31.8 - 35.4 g/dL   RDW, POC 12.2 %   Platelet Count, POC 255 142 - 424 K/uL   MPV 7.2 0 - 99.8 fL      Assessment and Plan: Jeremy Hendricks is a 60 y.o. male who is here today for cc of gout. At this time we will try a steroid to improve his symptoms, though he did not perform the nsaid as discussed.  rtc as needed  Medication management - Plan: POCT glucose (manual entry), POCT CBC, predniSONE (DELTASONE) 20 MG tablet  Acute gout, unspecified cause, unspecified site - Plan: predniSONE (DELTASONE) 20 MG tablet    Ivar Drape, PA-C [facility] 03/12/2016 8:43 AM

## 2016-03-28 ENCOUNTER — Ambulatory Visit: Payer: BLUE CROSS/BLUE SHIELD | Admitting: Family Medicine

## 2016-03-29 ENCOUNTER — Encounter: Payer: Self-pay | Admitting: Family Medicine

## 2016-03-29 ENCOUNTER — Ambulatory Visit (INDEPENDENT_AMBULATORY_CARE_PROVIDER_SITE_OTHER): Payer: BLUE CROSS/BLUE SHIELD | Admitting: Family Medicine

## 2016-03-29 VITALS — BP 108/70 | HR 61 | Temp 97.9°F | Resp 18 | Ht 68.0 in | Wt 180.2 lb

## 2016-03-29 DIAGNOSIS — R5383 Other fatigue: Secondary | ICD-10-CM | POA: Diagnosis not present

## 2016-03-29 DIAGNOSIS — I5032 Chronic diastolic (congestive) heart failure: Secondary | ICD-10-CM

## 2016-03-29 DIAGNOSIS — E291 Testicular hypofunction: Secondary | ICD-10-CM | POA: Diagnosis not present

## 2016-03-29 DIAGNOSIS — M1009 Idiopathic gout, multiple sites: Secondary | ICD-10-CM

## 2016-03-29 DIAGNOSIS — R0609 Other forms of dyspnea: Secondary | ICD-10-CM

## 2016-03-29 DIAGNOSIS — R351 Nocturia: Secondary | ICD-10-CM | POA: Diagnosis not present

## 2016-03-29 DIAGNOSIS — M109 Gout, unspecified: Secondary | ICD-10-CM

## 2016-03-29 LAB — POCT URINALYSIS DIP (MANUAL ENTRY)
BILIRUBIN UA: NEGATIVE
Bilirubin, UA: NEGATIVE
Blood, UA: NEGATIVE
Glucose, UA: NEGATIVE
LEUKOCYTES UA: NEGATIVE
Nitrite, UA: NEGATIVE
PH UA: 5.5
PROTEIN UA: NEGATIVE
UROBILINOGEN UA: 0.2

## 2016-03-29 LAB — BASIC METABOLIC PANEL
BUN: 15 mg/dL (ref 7–25)
CALCIUM: 9.2 mg/dL (ref 8.6–10.3)
CO2: 25 mmol/L (ref 20–31)
Chloride: 104 mmol/L (ref 98–110)
Creat: 1.07 mg/dL (ref 0.70–1.33)
Glucose, Bld: 98 mg/dL (ref 65–99)
Potassium: 4.4 mmol/L (ref 3.5–5.3)
SODIUM: 140 mmol/L (ref 135–146)

## 2016-03-29 LAB — POC MICROSCOPIC URINALYSIS (UMFC): MUCUS RE: ABSENT

## 2016-03-29 LAB — TESTOSTERONE: Testosterone: 560 ng/dL (ref 250–827)

## 2016-03-29 LAB — PSA: PSA: 0.9 ng/mL (ref <=4.0)

## 2016-03-29 MED ORDER — PREDNISONE 20 MG PO TABS: ORAL_TABLET | ORAL | 0 refills | Status: DC

## 2016-03-29 NOTE — Progress Notes (Signed)
By signing my name below, I, Jeremy Hendricks, attest that this documentation has been prepared under the direction and in the presence of Jeremy Ray, MD.  Electronically Signed: Verlee Hendricks, Medical Scribe. 03/29/16. 8:15 AM.  Subjective:    Patient ID: Jeremy Hendricks, male    DOB: 01-14-1956, 60 y.o.   MRN: [id number]  HPI Chief Complaint  Patient presents with  . Follow-up    from physical   . Fatigue    x1 month "doesn't feel like himself"    HPI Comments: Jeremy Hendricks is a 60 y.o. male who presents to the [facility] complaining of fatigue. Pt has a PMHx of CAD with CABG in 2002, and 2012. Cardiologist is Dr. Angelena Form and his last visit was June 27nd. He also had a hx of chronic diastolic CHF and post-op A fib. He had a nl exercise stress test June 2016 that was 15 mets. He had a nl hemoglobin on July 31st and overall nl BMP on June 30th, except blood sugar of 112. He also has a hx of hypogonadism, he's on testosterone replacement. Last testosterone level 181 in March. He uses androgel 2.5 g every other day.  Fatigue: Pt feels worn out and he feels winded but isn't sure if it's due to him getting older or that he's a fast paced person. Pt mentions some days he feels lethargic and he's just dragging which is out of character for him. Pt reports he felt fine yesterday. Pt has not taken a sleep study because he doesn't snore every day, also he wakes up once a month with choking sensation. Pt mentions he occasionally feels winded. Pt was taking Lasix BID, but he was advised by his cardiologist to cut back to 40 mg QD for the past month due to his creatinine levels. Pt mention's he's taking androgel every other day. Pt mentions he wakes up 3 times a night to urinate, and stays awake for 40 mins every time. Pt denies chest pain, chest tightness, wheezing, and fevers.  Stress: Pt mentions he's lived his whole life stressed since he has ADD, but he thrives off of the  stress. Pt mentions he's normally like the "energizer bunny" but he's lost his hearing and he feels like other things are going wrong.  Left foot Pain: Seen July 27th with flare of left toe gout and returned on the 31st for persistent left foot pain; still suspected gout. He did take initial colchicine. He was on 40 mg Prednisone down to 10 mg taper over 12 days. Prednisone helped his symptoms, but then returned.  Pt mentions he's had gout on his left great toe with episodic shooting pain in his leg for about a month with a 2-3 pain level. Pt mentions he can't wear his shoes like he used to and he's waking up at 2 am from the pain. Pt takes 3 OTC ibuprofen BID for little relief from his symptoms. Pt mentioned the Prednisone taper helped, but it never made the gout go away. Pt is not taking colchicine. Pt mentioned he stopped drinking for about 8 days for a trip to lose weight, but he drank more beer while on his trip.   Lab Results  Component Value Date   LABURIC 7.5 03/08/2016   Patient Active Problem List   Diagnosis Date Noted  . Hypogonadism in male 09/09/2015  . INSOMNIA 09/19/2010  . SHORTNESS OF BREATH 09/19/2010  . CHEST PAIN-PRECORDIAL 08/23/2010  . CAD, ARTERY BYPASS  GRAFT 10/26/2009  . HYPERLIPIDEMIA 10/24/2009  . DEGENERATIVE JOINT DISEASE 10/24/2009  . ANGINA, HX OF 10/24/2009   Past Medical History:  Diagnosis Date  . Atrial fibrillation ([place])    postoperative  . Coronary artery disease    s/p CABG 2002, redo 2012  . DJD (degenerative joint disease)   . History of ETT    a. ETT 6/16:  normal   Past Surgical History:  Procedure Laterality Date  . CORONARY ARTERY BYPASS GRAFT  2002  . CORONARY ARTERY BYPASS GRAFT  08-2010   L-LAD remained from original CABG; new grafts incl L radial- PDA + RIMA-RI  . VASECTOMY     No Known Allergies Prior to Admission medications   Medication Sig Start Date End Date Taking? Authorizing Provider  aspirin 325 MG tablet Take 325 mg  by mouth daily.      Historical Provider, MD  atenolol (TENORMIN) 25 MG tablet Take 1 tablet (25 mg total) by mouth daily. 02/02/16   Burnell Blanks, MD  colchicine 0.6 MG tablet Take 2 tablets by mouth then take 1 tablet in 1 hour Patient not taking: Reported on 03/12/2016 03/08/16   Dorian Heckle English, PA  furosemide (LASIX) 40 MG tablet take  40 mg one day alternating with BID the next day 02/03/16   Burnell Blanks, MD  Multiple Vitamin (MULTIVITAMIN) tablet Take 1 tablet by mouth daily.      Historical Provider, MD  nitroGLYCERIN (NITROSTAT) 0.4 MG SL tablet Place 0.4 mg under the tongue every 5 (five) minutes as needed for chest pain. Reported on 09/21/2015    Historical Provider, MD  rosuvastatin (CRESTOR) 40 MG tablet Take 1 tablet (40 mg total) by mouth daily. 02/29/16   Burnell Blanks, MD  Testosterone (ANDROGEL) 25 MG/2.5GM (1%) GEL Place 2.5 g onto the skin daily. 11/04/15   Wendie Agreste, MD  traMADol (ULTRAM) 50 MG tablet Take 1-2 tablets (50-100 mg total) by mouth every 8 (eight) hours as needed. 03/12/16   Joretta Bachelor, PA   Social History   Social History  . Marital status: Single    Spouse name: N/A  . Number of children: N/A  . Years of education: N/A   Occupational History  . Realtor    Social History Main Topics  . Smoking status: Former Smoker    Quit date: 08/14/1999  . Smokeless tobacco: Not on file  . Alcohol use 4.2 oz/week    7 Standard drinks or equivalent per week  . Drug use: No  . Sexual activity: Not on file   Other Topics Concern  . Not on file   Social History Narrative   Single   Education: College   Exercise: Yes   Depression screen [facility] 2/9 03/29/2016 03/12/2016 03/08/2016 09/21/2015 06/27/2015  Decreased Interest 0 0 0 0 0  Down, Depressed, Hopeless 0 0 0 0 0  PHQ - 2 Score 0 0 0 0 0   Review of Systems  Constitutional: Positive for fatigue. Negative for fever.  Respiratory: Positive for shortness of breath. Negative  for chest tightness and wheezing.   Cardiovascular: Negative for chest pain.  Genitourinary: Positive for frequency.  Musculoskeletal: Positive for arthralgias.  Psychiatric/Behavioral: Positive for sleep disturbance.   Objective:  Physical Exam  Constitutional: He appears well-developed and well-nourished. No distress.  HENT:  Head: Normocephalic and atraumatic.  Eyes: Conjunctivae are normal.  Neck: Neck supple.  Cardiovascular: Normal rate, regular rhythm and normal heart sounds.  Exam reveals  no gallop and no friction rub.   No murmur heard. Pulmonary/Chest: Effort normal and breath sounds normal. No respiratory distress. He has no wheezes. He has no rales.  Musculoskeletal:  Left great toe: slight erythema warmth of the first MTP Tenderness medially. No focal bony tenderness other than MTP No spread of warmth over the MTP  Neurological: He is alert.  Skin: Skin is warm and dry. No rash noted.  Psychiatric: He has a normal mood and affect. His behavior is normal.  Nursing note and vitals reviewed.  BP 108/70   Pulse 61   Temp 97.9 F (36.6 C) (Oral)   Resp 18   Ht 5\' 8"  (1.727 m)   Wt 180 lb 3.2 oz (81.7 kg)   SpO2 99%   BMI 27.40 kg/m     Assessment & Plan:    MUREL GUIJOSA is a 60 y.o. male Other fatigue - Plan: Basic metabolic panel, Vitamin D, 25-hydroxy  -Possibly multifactorial. Will check BMP, vitamin D level, testosterone level. Consider sleep study and possibly visit with cardiologist to discuss the symptoms further, but with recent stress testing looking okay, no signs of fluid overload or flare of CHF on exam, less likely cardiac source. Recheck in the next few weeks to discuss this further.  Nocturia - Plan: POCT urinalysis dipstick, POCT Microscopic Urinalysis ([facility]), PSA  -Check PSA.  No concerning findings on urinalysis.  Gouty arthritis - Plan: predniSONE (DELTASONE) 20 MG tablet  -Persistent pain/flare. Likely started after increased beer intake  when he was on vacation.  - try on more course of prednisone as some improvement previously.  side effects discussed. Consider allopurinol but want to wait until current flare resolves and check uric acid.  Chronic diastolic congestive heart failure (HCC) DOE (dyspnea on exertion) - Plan: Brain natriuretic peptide  -Check BMP, and as above, may need to follow-up with cardiology regarding this symptom as well as  Fatigue.  Hypogonadism in male - Plan: Testosterone, PSA    Meds ordered this encounter  Medications  . predniSONE (DELTASONE) 20 MG tablet    Sig: 3 by mouth for 3 days, then 2 by mouth for 2 days, then 1 by mouth for 2 days, then 1/2 by mouth for 2 days.    Dispense:  16 tablet    Refill:  0   Patient Instructions   Neck a vitamin D level, testosterone level, electrolytes to look into other causes of fatigue. I would consider sleep study or meeting with your cardiologist if these tests are normal. Follow-up with me in 2 weeks to discuss this further.  Your foot pain still appears to be due to gout. We can try one more taper of prednisone, then if gout resolves for at least 2 weeks, can start a medication that she take once per day to lessen chance of recurrence.  I will check a prostate test and urine test to look for causes of the frequent urination at night, but this can also be discussed at follow-up.  Return to the clinic or go to the nearest emergency room if any of your symptoms worsen or new symptoms occur.   Fatigue Fatigue is feeling tired all of the time, a lack of energy, or a lack of motivation. Occasional or mild fatigue is often a normal response to activity or life in general. However, long-lasting (chronic) or extreme fatigue may indicate an underlying medical condition. HOME CARE INSTRUCTIONS  Watch your fatigue for any changes. The following actions may  help to lessen any discomfort you are feeling:  Talk to your health care provider about how much sleep  you need each night. Try to get the required amount every night.  Take medicines only as directed by your health care provider.  Eat a healthy and nutritious diet. Ask your health care provider if you need help changing your diet.  Drink enough fluid to keep your urine clear or pale yellow.  Practice ways of relaxing, such as yoga, meditation, massage therapy, or acupuncture.  Exercise regularly.   Change situations that cause you stress. Try to keep your work and personal routine reasonable.  Do not abuse illegal drugs.  Limit alcohol intake to no more than 1 drink per day for nonpregnant women and 2 drinks per day for men. One drink equals 12 ounces of beer, 5 ounces of wine, or 1 ounces of hard liquor.  Take a multivitamin, if directed by your health care provider. SEEK MEDICAL CARE IF:   Your fatigue does not get better.  You have a fever.   You have unintentional weight loss or gain.  You have headaches.   You have difficulty:   Falling asleep.  Sleeping throughout the night.  You feel angry, guilty, anxious, or sad.   You are unable to have a bowel movement (constipation).   You skin is dry.   Your legs or another part of your body is swollen.  SEEK IMMEDIATE MEDICAL CARE IF:   You feel confused.   Your vision is blurry.  You feel faint or pass out.   You have a severe headache.   You have severe abdominal, pelvic, or back pain.   You have chest pain, shortness of breath, or an irregular or fast heartbeat.   You are unable to urinate or you urinate less than normal.   You develop abnormal bleeding, such as bleeding from the rectum, vagina, nose, lungs, or nipples.  You vomit blood.   You have thoughts about harming yourself or committing suicide.   You are worried that you might harm someone else.    This information is not intended to replace advice given to you by your health care provider. Make sure you discuss any  questions you have with your health care provider.   Document Released: 05/27/2007 Document Revised: 08/20/2014 Document Reviewed: 12/01/2013 Elsevier Interactive Patient Education 2016 [place].  Gout Gout is an inflammatory arthritis caused by a buildup of uric acid crystals in the joints. Uric acid is a chemical that is normally present in the blood. When the level of uric acid in the blood is too high it can form crystals that deposit in your joints and tissues. This causes joint redness, soreness, and swelling (inflammation). Repeat attacks are common. Over time, uric acid crystals can form into masses (tophi) near a joint, destroying bone and causing disfigurement. Gout is treatable and often preventable. CAUSES  The disease begins with elevated levels of uric acid in the blood. Uric acid is produced by your body when it breaks down a naturally found substance called purines. Certain foods you eat, such as meats and fish, contain high amounts of purines. Causes of an elevated uric acid level include:  Being passed down from parent to child (heredity).  Diseases that cause increased uric acid production (such as obesity, psoriasis, and certain cancers).  Excessive alcohol use.  Diet, especially diets rich in meat and seafood.  Medicines, including certain cancer-fighting medicines (chemotherapy), water pills (diuretics), and aspirin.  Chronic  kidney disease. The kidneys are no longer able to remove uric acid well.  Problems with metabolism. Conditions strongly associated with gout include:  Obesity.  High blood pressure.  High cholesterol.  Diabetes. Not everyone with elevated uric acid levels gets gout. It is not understood why some people get gout and others do not. Surgery, joint injury, and eating too much of certain foods are some of the factors that can lead to gout attacks. SYMPTOMS   An attack of gout comes on quickly. It causes intense pain with redness,  swelling, and warmth in a joint.  Fever can occur.  Often, only one joint is involved. Certain joints are more commonly involved:  Base of the big toe.  Knee.  Ankle.  Wrist.  Finger. Without treatment, an attack usually goes away in a few days to weeks. Between attacks, you usually will not have symptoms, which is different from many other forms of arthritis. DIAGNOSIS  Your caregiver will suspect gout based on your symptoms and exam. In some cases, tests may be recommended. The tests may include:  Blood tests.  Urine tests.  X-rays.  Joint fluid exam. This exam requires a needle to remove fluid from the joint (arthrocentesis). Using a microscope, gout is confirmed when uric acid crystals are seen in the joint fluid. TREATMENT  There are two phases to gout treatment: treating the sudden onset (acute) attack and preventing attacks (prophylaxis).  Treatment of an Acute Attack.  Medicines are used. These include anti-inflammatory medicines or steroid medicines.  An injection of steroid medicine into the affected joint is sometimes necessary.  The painful joint is rested. Movement can worsen the arthritis.  You may use warm or cold treatments on painful joints, depending which works best for you.  Treatment to Prevent Attacks.  If you suffer from frequent gout attacks, your caregiver may advise preventive medicine. These medicines are started after the acute attack subsides. These medicines either help your kidneys eliminate uric acid from your body or decrease your uric acid production. You may need to stay on these medicines for a very long time.  The early phase of treatment with preventive medicine can be associated with an increase in acute gout attacks. For this reason, during the first few months of treatment, your caregiver may also advise you to take medicines usually used for acute gout treatment. Be sure you understand your caregiver's directions. Your caregiver may  make several adjustments to your medicine dose before these medicines are effective.  Discuss dietary treatment with your caregiver or dietitian. Alcohol and drinks high in sugar and fructose and foods such as meat, poultry, and seafood can increase uric acid levels. Your caregiver or dietitian can advise you on drinks and foods that should be limited. HOME CARE INSTRUCTIONS   Do not take aspirin to relieve pain. This raises uric acid levels.  Only take over-the-counter or prescription medicines for pain, discomfort, or fever as directed by your caregiver.  Rest the joint as much as possible. When in bed, keep sheets and blankets off painful areas.  Keep the affected joint raised (elevated).  Apply warm or cold treatments to painful joints. Use of warm or cold treatments depends on which works best for you.  Use crutches if the painful joint is in your leg.  Drink enough fluids to keep your urine clear or pale yellow. This helps your body get rid of uric acid. Limit alcohol, sugary drinks, and fructose drinks.  Follow your dietary instructions. Pay careful  attention to the amount of protein you eat. Your daily diet should emphasize fruits, vegetables, whole grains, and fat-free or low-fat milk products. Discuss the use of coffee, vitamin C, and cherries with your caregiver or dietitian. These may be helpful in lowering uric acid levels.  Maintain a healthy body weight. SEEK MEDICAL CARE IF:   You develop diarrhea, vomiting, or any side effects from medicines.  You do not feel better in 24 hours, or you are getting worse. SEEK IMMEDIATE MEDICAL CARE IF:   Your joint becomes suddenly more tender, and you have chills or a fever. MAKE SURE YOU:   Understand these instructions.  Will watch your condition.  Will get help right away if you are not doing well or get worse.   This information is not intended to replace advice given to you by your health care provider. Make sure you  discuss any questions you have with your health care provider.   Document Released: 07/27/2000 Document Revised: 08/20/2014 Document Reviewed: 03/12/2012 Elsevier Interactive Patient Education 2016 [employer].     IF you received an x-Hendricks today, you will receive an invoice from [facility]. Please contact [facility] at [phone] with questions or concerns regarding your invoice.   IF you received labwork today, you will receive an invoice from [employer]. Please contact [facility] at [phone] with questions or concerns regarding your invoice.   Our billing staff will not be able to assist you with questions regarding bills from these companies.  You will be contacted with the lab results as soon as they are available. The fastest way to get your results is to activate your My Chart account. Instructions are located on the last page of this paperwork. If you have not heard from [place] regarding the results in 2 weeks, please contact this office.        I personally performed the services described in this documentation, which was scribed in my presence. The recorded information has been reviewed and considered, and addended by me as needed.   Signed,   Jeremy Ray, MD [facility].  03/30/16 1:19 PM

## 2016-03-29 NOTE — Patient Instructions (Addendum)
Neck a vitamin D level, testosterone level, electrolytes to look into other causes of fatigue. I would consider sleep study or meeting with your cardiologist if these tests are normal. Follow-up with me in 2 weeks to discuss this further.  Your foot pain still appears to be due to gout. We can try one more taper of prednisone, then if gout resolves for at least 2 weeks, can start a medication that she take once per day to lessen chance of recurrence.  I will check a prostate test and urine test to look for causes of the frequent urination at night, but this can also be discussed at follow-up.  Return to the clinic or go to the nearest emergency room if any of your symptoms worsen or new symptoms occur.   Fatigue Fatigue is feeling tired all of the time, a lack of energy, or a lack of motivation. Occasional or mild fatigue is often a normal response to activity or life in general. However, long-lasting (chronic) or extreme fatigue may indicate an underlying medical condition. HOME CARE INSTRUCTIONS  Watch your fatigue for any changes. The following actions may help to lessen any discomfort you are feeling:  Talk to your health care provider about how much sleep you need each night. Try to get the required amount every night.  Take medicines only as directed by your health care provider.  Eat a healthy and nutritious diet. Ask your health care provider if you need help changing your diet.  Drink enough fluid to keep your urine clear or pale yellow.  Practice ways of relaxing, such as yoga, meditation, massage therapy, or acupuncture.  Exercise regularly.   Change situations that cause you stress. Try to keep your work and personal routine reasonable.  Do not abuse illegal drugs.  Limit alcohol intake to no more than 1 drink per day for nonpregnant women and 2 drinks per day for men. One drink equals 12 ounces of beer, 5 ounces of wine, or 1 ounces of hard liquor.  Take a multivitamin,  if directed by your health care provider. SEEK MEDICAL CARE IF:   Your fatigue does not get better.  You have a fever.   You have unintentional weight loss or gain.  You have headaches.   You have difficulty:   Falling asleep.  Sleeping throughout the night.  You feel angry, guilty, anxious, or sad.   You are unable to have a bowel movement (constipation).   You skin is dry.   Your legs or another part of your body is swollen.  SEEK IMMEDIATE MEDICAL CARE IF:   You feel confused.   Your vision is blurry.  You feel faint or pass out.   You have a severe headache.   You have severe abdominal, pelvic, or back pain.   You have chest pain, shortness of breath, or an irregular or fast heartbeat.   You are unable to urinate or you urinate less than normal.   You develop abnormal bleeding, such as bleeding from the rectum, vagina, nose, lungs, or nipples.  You vomit blood.   You have thoughts about harming yourself or committing suicide.   You are worried that you might harm someone else.    This information is not intended to replace advice given to you by your health care provider. Make sure you discuss any questions you have with your health care provider.   Document Released: 05/27/2007 Document Revised: 08/20/2014 Document Reviewed: 12/01/2013 Elsevier Interactive Patient Education [employer].  Gout Gout is an inflammatory arthritis caused by a buildup of uric acid crystals in the joints. Uric acid is a chemical that is normally present in the blood. When the level of uric acid in the blood is too high it can form crystals that deposit in your joints and tissues. This causes joint redness, soreness, and swelling (inflammation). Repeat attacks are common. Over time, uric acid crystals can form into masses (tophi) near a joint, destroying bone and causing disfigurement. Gout is treatable and often preventable. CAUSES  The disease begins  with elevated levels of uric acid in the blood. Uric acid is produced by your body when it breaks down a naturally found substance called purines. Certain foods you eat, such as meats and fish, contain high amounts of purines. Causes of an elevated uric acid level include:  Being passed down from parent to child (heredity).  Diseases that cause increased uric acid production (such as obesity, psoriasis, and certain cancers).  Excessive alcohol use.  Diet, especially diets rich in meat and seafood.  Medicines, including certain cancer-fighting medicines (chemotherapy), water pills (diuretics), and aspirin.  Chronic kidney disease. The kidneys are no longer able to remove uric acid well.  Problems with metabolism. Conditions strongly associated with gout include:  Obesity.  High blood pressure.  High cholesterol.  Diabetes. Not everyone with elevated uric acid levels gets gout. It is not understood why some people get gout and others do not. Surgery, joint injury, and eating too much of certain foods are some of the factors that can lead to gout attacks. SYMPTOMS   An attack of gout comes on quickly. It causes intense pain with redness, swelling, and warmth in a joint.  Fever can occur.  Often, only one joint is involved. Certain joints are more commonly involved:  Base of the big toe.  Knee.  Ankle.  Wrist.  Finger. Without treatment, an attack usually goes away in a few days to weeks. Between attacks, you usually will not have symptoms, which is different from many other forms of arthritis. DIAGNOSIS  Your caregiver will suspect gout based on your symptoms and exam. In some cases, tests may be recommended. The tests may include:  Blood tests.  Urine tests.  X-rays.  Joint fluid exam. This exam requires a needle to remove fluid from the joint (arthrocentesis). Using a microscope, gout is confirmed when uric acid crystals are seen in the joint fluid. TREATMENT   There are two phases to gout treatment: treating the sudden onset (acute) attack and preventing attacks (prophylaxis).  Treatment of an Acute Attack.  Medicines are used. These include anti-inflammatory medicines or steroid medicines.  An injection of steroid medicine into the affected joint is sometimes necessary.  The painful joint is rested. Movement can worsen the arthritis.  You may use warm or cold treatments on painful joints, depending which works best for you.  Treatment to Prevent Attacks.  If you suffer from frequent gout attacks, your caregiver may advise preventive medicine. These medicines are started after the acute attack subsides. These medicines either help your kidneys eliminate uric acid from your body or decrease your uric acid production. You may need to stay on these medicines for a very long time.  The early phase of treatment with preventive medicine can be associated with an increase in acute gout attacks. For this reason, during the first few months of treatment, your caregiver may also advise you to take medicines usually used for acute gout treatment. Be sure  you understand your caregiver's directions. Your caregiver may make several adjustments to your medicine dose before these medicines are effective.  Discuss dietary treatment with your caregiver or dietitian. Alcohol and drinks high in sugar and fructose and foods such as meat, poultry, and seafood can increase uric acid levels. Your caregiver or dietitian can advise you on drinks and foods that should be limited. HOME CARE INSTRUCTIONS   Do not take aspirin to relieve pain. This raises uric acid levels.  Only take over-the-counter or prescription medicines for pain, discomfort, or fever as directed by your caregiver.  Rest the joint as much as possible. When in bed, keep sheets and blankets off painful areas.  Keep the affected joint raised (elevated).  Apply warm or cold treatments to painful joints.  Use of warm or cold treatments depends on which works best for you.  Use crutches if the painful joint is in your leg.  Drink enough fluids to keep your urine clear or pale yellow. This helps your body get rid of uric acid. Limit alcohol, sugary drinks, and fructose drinks.  Follow your dietary instructions. Pay careful attention to the amount of protein you eat. Your daily diet should emphasize fruits, vegetables, whole grains, and fat-free or low-fat milk products. Discuss the use of coffee, vitamin C, and cherries with your caregiver or dietitian. These may be helpful in lowering uric acid levels.  Maintain a healthy body weight. SEEK MEDICAL CARE IF:   You develop diarrhea, vomiting, or any side effects from medicines.  You do not feel better in 24 hours, or you are getting worse. SEEK IMMEDIATE MEDICAL CARE IF:   Your joint becomes suddenly more tender, and you have chills or a fever. MAKE SURE YOU:   Understand these instructions.  Will watch your condition.  Will get help right away if you are not doing well or get worse.   This information is not intended to replace advice given to you by your health care provider. Make sure you discuss any questions you have with your health care provider.   Document Released: 07/27/2000 Document Revised: 08/20/2014 Document Reviewed: 03/12/2012 Elsevier Interactive Patient Education 2016 [employer].     IF you received an x-ray today, you will receive an invoice from [facility]. Please contact [facility] at [phone] with questions or concerns regarding your invoice.   IF you received labwork today, you will receive an invoice from [employer]. Please contact [facility] at [phone] with questions or concerns regarding your invoice.   Our billing staff will not be able to assist you with questions regarding bills from these companies.  You will be contacted with the lab results as  soon as they are available. The fastest way to get your results is to activate your My Chart account. Instructions are located on the last page of this paperwork. If you have not heard from [place] regarding the results in 2 weeks, please contact this office.

## 2016-03-30 LAB — VITAMIN D 25 HYDROXY (VIT D DEFICIENCY, FRACTURES): VIT D 25 HYDROXY: 43 ng/mL (ref 30–100)

## 2016-03-30 LAB — BRAIN NATRIURETIC PEPTIDE: Brain Natriuretic Peptide: 36.5 pg/mL (ref <100)

## 2016-04-27 ENCOUNTER — Ambulatory Visit: Payer: BC Managed Care – PPO | Admitting: Cardiovascular Disease

## 2016-06-06 ENCOUNTER — Other Ambulatory Visit: Payer: Self-pay | Admitting: Family Medicine

## 2016-06-09 MED ORDER — TESTOSTERONE 25 MG/2.5GM (1%) TD GEL: 25.0000 mg | Freq: Every day | TRANSDERMAL | 1 refills | Status: DC

## 2016-06-09 NOTE — Telephone Encounter (Signed)
Not sure why change in dose (50mg  instead of 25mg then QOD).  I ordered previous Rx as it appears that was one we received prior auth on.

## 2016-06-11 ENCOUNTER — Ambulatory Visit (INDEPENDENT_AMBULATORY_CARE_PROVIDER_SITE_OTHER): Payer: BLUE CROSS/BLUE SHIELD

## 2016-06-11 ENCOUNTER — Ambulatory Visit (INDEPENDENT_AMBULATORY_CARE_PROVIDER_SITE_OTHER): Payer: BLUE CROSS/BLUE SHIELD | Admitting: Physician Assistant

## 2016-06-11 VITALS — BP 110/74 | HR 65 | Temp 98.2°F | Resp 17 | Ht 68.0 in | Wt 176.0 lb

## 2016-06-11 DIAGNOSIS — J181 Lobar pneumonia, unspecified organism: Secondary | ICD-10-CM

## 2016-06-11 DIAGNOSIS — R05 Cough: Secondary | ICD-10-CM

## 2016-06-11 DIAGNOSIS — E291 Testicular hypofunction: Secondary | ICD-10-CM | POA: Insufficient documentation

## 2016-06-11 DIAGNOSIS — R059 Cough, unspecified: Secondary | ICD-10-CM

## 2016-06-11 DIAGNOSIS — R0981 Nasal congestion: Secondary | ICD-10-CM

## 2016-06-11 DIAGNOSIS — R5383 Other fatigue: Secondary | ICD-10-CM | POA: Diagnosis not present

## 2016-06-11 DIAGNOSIS — J189 Pneumonia, unspecified organism: Secondary | ICD-10-CM

## 2016-06-11 DIAGNOSIS — E785 Hyperlipidemia, unspecified: Secondary | ICD-10-CM | POA: Insufficient documentation

## 2016-06-11 LAB — POCT CBC
Granulocyte percent: 82.8 %G — AB (ref 37–80)
HCT, POC: 37.2 % — AB (ref 43.5–53.7)
HEMOGLOBIN: 13.5 g/dL — AB (ref 14.1–18.1)
Lymph, poc: 1.3 (ref 0.6–3.4)
MCH: 31.9 pg — AB (ref 27–31.2)
MCHC: 36.4 g/dL — AB (ref 31.8–35.4)
MCV: 87.8 fL (ref 80–97)
MID (cbc): 0.3 (ref 0–0.9)
MPV: 7.2 fL (ref 0–99.8)
PLATELET COUNT, POC: 234 10*3/uL (ref 142–424)
POC Granulocyte: 7.9 — AB (ref 2–6.9)
POC LYMPH PERCENT: 13.9 %L (ref 10–50)
POC MID %: 3.3 %M (ref 0–12)
RBC: 4.24 M/uL — AB (ref 4.69–6.13)
RDW, POC: 12.4 %
WBC: 9.6 10*3/uL (ref 4.6–10.2)

## 2016-06-11 MED ORDER — HYDROCOD POLST-CPM POLST ER 10-8 MG/5ML PO SUER: 5.0000 mL | Freq: Two times a day (BID) | ORAL | 0 refills | Status: DC | PRN

## 2016-06-11 MED ORDER — AZITHROMYCIN 250 MG PO TABS: ORAL_TABLET | ORAL | 0 refills | Status: DC

## 2016-06-11 MED ORDER — BENZONATATE 100 MG PO CAPS
100.0000 mg | ORAL_CAPSULE | Freq: Three times a day (TID) | ORAL | 0 refills | Status: DC | PRN
Start: 2016-06-11 — End: 2016-06-20

## 2016-06-11 MED ORDER — FLUTICASONE PROPIONATE 50 MCG/ACT NA SUSP
2.0000 | Freq: Every day | NASAL | 6 refills | Status: DC
Start: 2016-06-11 — End: 2017-02-11

## 2016-06-11 NOTE — Progress Notes (Signed)
MRN: [id number] DOB: [birth year]  Subjective:   Jeremy Hendricks is a 60 y.o. male presenting for chief complaint of Cough (Productive and dyspnea x1day) . Reports 3 day history of sinus congestion, ear fullness, sore throat, productive cough (no hemoptysis), wheezing and shortness of breath, fatigue. Has tried cough medicine with no relief. Denies fever, sinus headache, chest tightness, chest pain and myalgia, night sweats, chills, nausea, vomiting, abdominal pain and diarrhea. Has not had any sick contact with anyone. Has history of seasonal allergies and a history of asthma as a child. Patient has had flu shot this season. Former smoker, smoked maybe one cigarette daily x  20 years, quit 15 years. Drinks 10 oz red wine daily. Denies any other aggravating or relieving factors, no other questions or concerns.   Of note, pt did just move into an old run down house that needs remodeling and has a lot of dust in it. He thinks this could be leading to his symptoms.    Jeremy Hendricks has a current medication list which includes the following prescription(s): aspirin, atenolol, furosemide, multivitamin, nitroglycerin, prednisone, rosuvastatin, and testosterone. Also has No Known Allergies.  Jeremy Hendricks  has a past medical history of Atrial fibrillation ([place]); Coronary artery disease; DJD (degenerative joint disease); and History of ETT. 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  Objective:   Vitals: BP 110/74 (BP Location: Left Arm, Patient Position: Sitting, Cuff Size: Large)   Pulse 65   Temp 98.2 F (36.8 C) (Oral)   Resp 17   Ht 5\' 8"  (1.727 m)   Wt 176 lb (79.8 kg)   SpO2 97%   BMI 26.76 kg/m   Physical Exam  Constitutional: He is oriented to person, place, and time. He appears well-developed and well-nourished.  HENT:  Head: Normocephalic and atraumatic.  Right Ear: External ear and ear canal normal.  Tympanic membrane is not erythematous and not bulging. A middle ear effusion is present.  Left Ear: Ear canal normal. Tympanic membrane is not erythematous and not bulging. A middle ear effusion is present.  Nose: Mucosal edema and rhinorrhea present. Right sinus exhibits no maxillary sinus tenderness and no frontal sinus tenderness. Left sinus exhibits no maxillary sinus tenderness and no frontal sinus tenderness.  Mouth/Throat: Uvula is midline, oropharynx is clear and moist and mucous membranes are normal.  Eyes: Conjunctivae are normal.  Neck: Normal range of motion.  Cardiovascular: Normal rate, regular rhythm and normal heart sounds.   Pulmonary/Chest: Effort normal. He has wheezes in the right lower field. He has rhonchi in the right lower field. He has no rales.  Lymphadenopathy:       Head (right side): No submental, no submandibular, no tonsillar, no preauricular, no posterior auricular and no occipital adenopathy present.       Head (left side): No submental, no submandibular, no tonsillar, no preauricular, no posterior auricular and no occipital adenopathy present.    He has no cervical adenopathy.       Right: No supraclavicular adenopathy present.       Left: No supraclavicular adenopathy present.  Neurological: He is alert and oriented to person, place, and time.  Skin: Skin is warm and dry.  Psychiatric: He has a normal mood and affect.  Vitals reviewed.  Results for orders placed or performed in visit on 06/11/16 (from the past 24 hour(s))  POCT CBC     Status: Abnormal  Collection Time: 06/11/16  3:30 PM  Result Value Ref Range   WBC 9.6 4.6 - 10.2 K/uL   Lymph, poc 1.3 0.6 - 3.4   POC LYMPH PERCENT 13.9 10 - 50 %L   MID (cbc) 0.3 0 - 0.9   POC MID % 3.3 0 - 12 %M   POC Granulocyte 7.9 (A) 2 - 6.9   Granulocyte percent 82.8 (A) 37 - 80 %G   RBC 4.24 (A) 4.69 - 6.13 M/uL   Hemoglobin 13.5 (A) 14.1 - 18.1 g/dL   HCT, POC 37.2 (A) 43.5 - 53.7 %   MCV 87.8 80 - 97 fL     MCH, POC 31.9 (A) 27 - 31.2 pg   MCHC 36.4 (A) 31.8 - 35.4 g/dL   RDW, POC 12.4 %   Platelet Count, POC 234 142 - 424 K/uL   MPV 7.2 0 - 99.8 fL   Dg Chest 2 View  Result Date: 06/11/2016 CLINICAL DATA:  Productive cough 3 days EXAM: CHEST  2 VIEW COMPARISON:  12/01/2010 FINDINGS: There is right lower lobe airspace disease concerning for pneumonia. There is a small right pleural effusion. There is no pneumothorax. The heart and mediastinal contours are unremarkable. There is evidence of prior CABG. The osseous structures are unremarkable. IMPRESSION: Right lower lobe pneumonia. Followup PA and lateral chest X-ray is recommended in 3-4 weeks following trial of antibiotic therapy to ensure resolution and exclude underlying malignancy. Electronically Signed   By: Kathreen Devoid   On: 06/11/2016 15:50    Assessment and Plan :  1. Cough - DG Chest 2 View; Future - chlorpheniramine-HYDROcodone (TUSSIONEX PENNKINETIC ER) 10-8 MG/5ML SUER; Take 5 mLs by mouth every 12 (twelve) hours as needed for cough.  Dispense: 100 mL; Refill: 0 - benzonatate (TESSALON) 100 MG capsule; Take 1-2 capsules (100-200 mg total) by mouth 3 (three) times daily as needed for cough.  Dispense: 40 capsule; Refill: 0  2. Other fatigue - POCT CBC  3. Community acquired pneumonia of right lower lobe of lung ([place]) -Pt to follow up in 4 weeks for CXR to ensure resolution and exclude underlying malignancy, as recommended by radiologist reading today's CXR. - azithromycin (ZITHROMAX) 250 MG tablet; Take 2 tabs PO x 1 dose, then 1 tab PO QD x 4 days  Dispense: 6 tablet; Refill: 0  4. Nasal congestion - fluticasone (FLONASE) 50 MCG/ACT nasal spray; Place 2 sprays into both nostrils daily.  Dispense: 16 g; Refill: [place], PA-C  [facility] 06/11/2016 3:53 PM

## 2016-06-11 NOTE — Telephone Encounter (Signed)
Message left for pt to pick up prescriptionn at pharmacy.

## 2016-06-11 NOTE — Patient Instructions (Addendum)
-Take antibiotics as prescribed. Follow up in 4 weeks for repeat chest xray. You can make an appointment with me over the phone in three weeks.   - I recommend you rest, drink plenty of fluids, eat light meals including soups.  - You may use cough syrup at night for your cough and sore throat, Tessalon pearls during the day. Be aware that cough syrup can definitely make you drowsy and sleepy so do not drive or operate any heavy machinery if it is affecting you during the day.  - You may also use Tylenol or ibuprofen over-the-counter for your sore throat.  - You may use flonase for your nasal congestion and ear fluid. I would continue to use this for your allergies until you get your new house cleared from all dust irritants.       Community-Acquired Pneumonia, Adult Pneumonia is an infection of the lungs. One type of pneumonia can happen while a person is in a hospital. A different type can happen when a person is not in a hospital (community-acquired pneumonia). It is easy for this kind to spread from person to person. It can spread to you if you breathe near an infected person who coughs or sneezes. Some symptoms include:  A dry cough.  A wet (productive) cough.  Fever.  Sweating.  Chest pain. HOME CARE  Take over-the-counter and prescription medicines only as told by your doctor.  Only take cough medicine if you are losing sleep.  If you were prescribed an antibiotic medicine, take it as told by your doctor. Do not stop taking the antibiotic even if you start to feel better.  Sleep with your head and neck raised (elevated). You can do this by putting a few pillows under your head, or you can sleep in a recliner.  Do not use tobacco products. These include cigarettes, chewing tobacco, and e-cigarettes. If you need help quitting, ask your doctor.  Drink enough water to keep your pee (urine) clear or pale yellow. A shot (vaccine) can help prevent pneumonia. Shots are often  suggested for:  People older than 59 years of age.  People older than 59 years of age:  Who are having cancer treatment.  Who have long-term (chronic) lung disease.  Who have problems with their body's defense system (immune system). You may also prevent pneumonia if you take these actions:  Get the flu (influenza) shot every year.  Go to the dentist as often as told.  Wash your hands often. If soap and water are not available, use hand sanitizer. GET HELP IF:  You have a fever.  You lose sleep because your cough medicine does not help. GET HELP RIGHT AWAY IF:  You are short of breath and it gets worse.  You have more chest pain.  Your sickness gets worse. This is very serious if:  You are an older adult.  Your body's defense system is weak.  You cough up blood.   This information is not intended to replace advice given to you by your health care provider. Make sure you discuss any questions you have with your health care provider.   Document Released: 01/16/2008 Document Revised: 04/20/2015 Document Reviewed: 11/24/2014 Elsevier Interactive Patient Education 2016 [employer].    IF you received an x-ray today, you will receive an invoice from [facility]. Please contact [facility] at [phone] with questions or concerns regarding your invoice.   IF you received labwork today, you will receive an invoice from [facility]. Please contact [facility] at [phone] with questions or concerns regarding your invoice.   Our billing staff will not be able to assist you with questions regarding bills from these companies.  You will be contacted with the lab results as soon as they are available. The fastest way to get your results is to activate your My Chart account. Instructions are located on the last page of this paperwork. If you have not heard from [place] regarding the results in 2 weeks, please contact this office.

## 2016-06-12 ENCOUNTER — Other Ambulatory Visit: Payer: Self-pay | Admitting: Family Medicine

## 2016-06-14 ENCOUNTER — Other Ambulatory Visit: Payer: Self-pay | Admitting: Physician Assistant

## 2016-06-14 DIAGNOSIS — R05 Cough: Secondary | ICD-10-CM

## 2016-06-14 DIAGNOSIS — R059 Cough, unspecified: Secondary | ICD-10-CM

## 2016-06-18 NOTE — Telephone Encounter (Signed)
Please let patient know that by law I can only give one prescription for this medication due to it containing a controlled substance. Also, I prescribed the first 167ml bottle on 10/30 and he was only supposed to use 84ml every 12 hours. There is no reason he should have ran out on 06/14/16. Thanks!

## 2016-06-19 ENCOUNTER — Encounter: Payer: Self-pay | Admitting: Physician Assistant

## 2016-06-19 ENCOUNTER — Encounter: Payer: Self-pay | Admitting: Family Medicine

## 2016-06-20 ENCOUNTER — Other Ambulatory Visit: Payer: Self-pay | Admitting: Physician Assistant

## 2016-06-20 DIAGNOSIS — R059 Cough, unspecified: Secondary | ICD-10-CM

## 2016-06-20 DIAGNOSIS — R05 Cough: Secondary | ICD-10-CM

## 2016-06-20 MED ORDER — BENZONATATE 100 MG PO CAPS: 100.0000 mg | ORAL_CAPSULE | Freq: Three times a day (TID) | ORAL | 0 refills | Status: DC | PRN

## 2016-06-20 MED ORDER — HYDROCOD POLST-CPM POLST ER 10-8 MG/5ML PO SUER: 5.0000 mL | Freq: Two times a day (BID) | ORAL | 0 refills | Status: DC | PRN

## 2016-06-20 NOTE — Progress Notes (Signed)
Pt contacted via phone. He notes he is feeling better from the zpack but he still has a lingering cough. He would like a refill for tessalon perles and tussionex as he is traveling to [place] this weekend. I have put in the refills. He has been instructed to follow up for repeat CXR in 3 weeks as discussed at his last visit. If symptoms worsen, come back sooner. Pt understands and agrees to treatment.   Meds ordered this encounter  Medications  . benzonatate (TESSALON) 100 MG capsule    Sig: Take 1-2 capsules (100-200 mg total) by mouth 3 (three) times daily as needed for cough.    Dispense:  40 capsule    Refill:  0    Order Specific Question:   Supervising Provider    Answer:   Wardell Honour [[id number]]  . chlorpheniramine-HYDROcodone (TUSSIONEX PENNKINETIC ER) 10-8 MG/5ML SUER    Sig: Take 5 mLs by mouth every 12 (twelve) hours as needed for cough.    Dispense:  100 mL    Refill:  0    Order Specific Question:   Supervising Provider    Answer:   Wardell Honour [[id number]]

## 2016-06-22 NOTE — Telephone Encounter (Signed)
We have been in touch with pt since this by email.

## 2016-10-18 ENCOUNTER — Other Ambulatory Visit: Payer: Self-pay | Admitting: Family Medicine

## 2016-10-18 NOTE — Telephone Encounter (Signed)
03/2016 last ov 

## 2016-10-19 NOTE — Telephone Encounter (Signed)
Pt advised and rx up front

## 2016-10-19 NOTE — Telephone Encounter (Signed)
Refilled, but please schedule OV in next month for follow up labs as usually monitor those every 6 months.

## 2016-10-20 ENCOUNTER — Other Ambulatory Visit: Payer: Self-pay | Admitting: Family Medicine

## 2016-12-15 NOTE — Telephone Encounter (Signed)
Asking for 90 day supply

## 2016-12-15 NOTE — Addendum Note (Signed)
Addended by: Virgia Land on: 12/15/2016 12:44 PM   Modules accepted: Orders

## 2016-12-18 MED ORDER — TESTOSTERONE 50 MG/5GM (1%) TD GEL: TRANSDERMAL | 1 refills | Status: DC

## 2016-12-18 NOTE — Addendum Note (Signed)
Addended by: [name] R on: 12/18/2016 08:52 AM   Modules accepted: Orders

## 2016-12-18 NOTE — Telephone Encounter (Signed)
rx up front 

## 2017-01-22 ENCOUNTER — Encounter: Payer: Self-pay | Admitting: Family Medicine

## 2017-02-11 ENCOUNTER — Encounter: Payer: Self-pay | Admitting: Family Medicine

## 2017-02-11 ENCOUNTER — Ambulatory Visit (INDEPENDENT_AMBULATORY_CARE_PROVIDER_SITE_OTHER): Payer: BC Managed Care – PPO | Admitting: Family Medicine

## 2017-02-11 VITALS — BP 101/64 | HR 58 | Temp 97.7°F | Resp 16 | Ht 67.5 in | Wt 178.0 lb

## 2017-02-11 DIAGNOSIS — Z5181 Encounter for therapeutic drug level monitoring: Secondary | ICD-10-CM | POA: Diagnosis not present

## 2017-02-11 DIAGNOSIS — E291 Testicular hypofunction: Secondary | ICD-10-CM | POA: Diagnosis not present

## 2017-02-11 DIAGNOSIS — Z125 Encounter for screening for malignant neoplasm of prostate: Secondary | ICD-10-CM

## 2017-02-11 DIAGNOSIS — Z130 Encounter for screening for diseases of the blood and blood-forming organs and certain disorders involving the immune mechanism: Secondary | ICD-10-CM

## 2017-02-11 DIAGNOSIS — Z1211 Encounter for screening for malignant neoplasm of colon: Secondary | ICD-10-CM

## 2017-02-11 DIAGNOSIS — Z23 Encounter for immunization: Secondary | ICD-10-CM

## 2017-02-11 DIAGNOSIS — M545 Low back pain: Secondary | ICD-10-CM | POA: Diagnosis not present

## 2017-02-11 DIAGNOSIS — Z0000 Encounter for general adult medical examination without abnormal findings: Secondary | ICD-10-CM

## 2017-02-11 DIAGNOSIS — L237 Allergic contact dermatitis due to plants, except food: Secondary | ICD-10-CM

## 2017-02-11 DIAGNOSIS — E785 Hyperlipidemia, unspecified: Secondary | ICD-10-CM | POA: Diagnosis not present

## 2017-02-11 DIAGNOSIS — Z1159 Encounter for screening for other viral diseases: Secondary | ICD-10-CM | POA: Diagnosis not present

## 2017-02-11 MED ORDER — CYCLOBENZAPRINE HCL 5 MG PO TABS: ORAL_TABLET | ORAL | 0 refills | Status: DC

## 2017-02-11 MED ORDER — ZOSTER VAC RECOMB ADJUVANTED 50 MCG/0.5ML IM SUSR: 0.5000 mL | Freq: Once | INTRAMUSCULAR | 1 refills | Status: AC

## 2017-02-11 NOTE — Progress Notes (Signed)
Subjective:  By signing my name below, I, Moises Blood, attest that this documentation has been prepared under the direction and in the presence of [name], MD. Electronically Signed: Moises Blood, Bricelyn. 02/11/2017 , 2:38 PM .  Patient was seen in Room 3 .   Patient ID: Jeremy Hendricks, male    DOB: 09-Aug-1956, 61 y.o.   MRN: [id number] Chief Complaint  Patient presents with  . Annual Exam   HPI Jeremy Hendricks is a 61 y.o. male Here for annual physical. He has history of CAD, hyperlipidemia, hypogonadism. He is followed by cardiologist, Dr. Angelena Form, last office visit June 2017.   Low back pain Over the past weekend, he's been laying out flight stones. When he woke up this morning, his low back has been hurting. He denies any incontinence, saddle anesthesia, weakness in his lower extremities, or pain radiating down his lower extremities. He's been taking Advil for this.   Poison Ivy He's been out cleaning a property this past weekend. He noticed rash over his neck starting after contact with poison ivy. He denies rash over his face or down around his genitals.   Hypogonadism He is still using Andro-gel 50mg  per 5g tube, 1%; half tube daily.   Lab Results  Component Value Date   PSA 0.9 03/29/2016   Lab Results  Component Value Date   CHOL 119 (L) 02/03/2016   HDL 32 (L) 02/03/2016   LDLCALC 66 02/03/2016   TRIG 107 02/03/2016   CHOLHDL 3.7 02/03/2016   Lab Results  Component Value Date   WBC 9.6 06/11/2016   HGB 13.5 (A) 06/11/2016   HCT 37.2 (A) 06/11/2016   MCV 87.8 06/11/2016   PLT 275 09/04/2010   His last testosterone was 560 in Aug 2017. He's still taking his Crestor 40mg  QD.   Cancer Screening Colonoscopy: Jan 2008, done in [place]. He would like to be referred locally.  Prostate cancer screening: agrees to screening with DRE today.  Lab Results  Component Value Date   PSA 0.9 03/29/2016    Immunizations Immunization History    Administered Date(s) Administered  . Hepatitis B 09/20/2000  . Influenza Split 05/14/2015, 06/11/2016  . Zoster 08/13/2013   Shingles: Prescription was given today.  Tdap: Unknown last received. He agrees to update it today.   Hep C screening: He agrees to this screening today.  HIV screening: He had it done 3 weeks ago (June 11th), it was negative.   Depression Depression screen [facility] 2/9 02/11/2017 06/11/2016 03/29/2016 03/12/2016 03/08/2016  Decreased Interest 0 0 0 0 0  Down, Depressed, Hopeless 0 0 0 0 0  PHQ - 2 Score 0 0 0 0 0    Vision  Visual Acuity Screening   Right eye Left eye Both eyes  Without correction: 20/25 20/25 20/20   With correction:       Dentist He's followed by dentist, seen about every 6 months.   Exercise He goes to the gym and does back exercises to relief his back pain. He also fixes up properties.     Patient Active Problem List   Diagnosis Date Noted  . 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 HLD (hyperlipidemia) 06/11/2016  . Hypogonadism in male 09/09/2015  . INSOMNIA 09/19/2010  . SHORTNESS OF BREATH 09/19/2010  . CHEST PAIN-PRECORDIAL 08/23/2010  . CAD, ARTERY BYPASS GRAFT 10/26/2009  . HYPERLIPIDEMIA 10/24/2009  . DEGENERATIVE JOINT DISEASE 10/24/2009  . ANGINA, HX OF 10/24/2009   Past Medical History:  Diagnosis Date  .  Atrial fibrillation ([place])    postoperative  . Coronary artery disease    s/p CABG 2002, redo 2012  . DJD (degenerative joint disease)   . History of ETT    a. ETT 6/16:  normal   Past Surgical History:  Procedure Laterality Date  . CORONARY ARTERY BYPASS GRAFT  2002  . CORONARY ARTERY BYPASS GRAFT  08-2010   L-LAD remained from original CABG; new grafts incl L radial- PDA + RIMA-RI  . VASECTOMY     No Known Allergies Prior to Admission medications   Medication Sig Start Date End Date Taking? Authorizing Provider  aspirin 325 MG tablet Take 325 mg by mouth daily.     Yes [provider]  atenolol (TENORMIN) 25  MG tablet Take 1 tablet (25 mg total) by mouth daily. 02/02/16  Yes Burnell Blanks, MD  furosemide (LASIX) 40 MG tablet take  40 mg one day alternating with BID the next day 02/03/16  Yes Burnell Blanks, MD  Multiple Vitamin (MULTIVITAMIN) tablet Take 1 tablet by mouth daily.     Yes [provider]  rosuvastatin (CRESTOR) 40 MG tablet Take 1 tablet (40 mg total) by mouth daily. 02/29/16  Yes Burnell Blanks, MD  testosterone (ANDROGEL) 50 MG/5GM (1%) GEL USE 1/2 TUBE ONTO THE SKIN DAILY 12/18/16  Yes Wendie Agreste, MD  azithromycin (ZITHROMAX) 250 MG tablet Take 2 tabs PO x 1 dose, then 1 tab PO QD x 4 days Patient not taking: Reported on 02/11/2017 06/11/16   Tenna Delaine D, PA-C  benzonatate (TESSALON) 100 MG capsule Take 1-2 capsules (100-200 mg total) by mouth 3 (three) times daily as needed for cough. Patient not taking: Reported on 02/11/2017 06/20/16   Tenna Delaine D, PA-C  chlorpheniramine-HYDROcodone [facility]) 10-8 MG/5ML SUER Take 5 mLs by mouth every 12 (twelve) hours as needed for cough. Patient not taking: Reported on 02/11/2017 06/20/16   Tenna Delaine D, PA-C  fluticasone [facility]) 50 MCG/ACT nasal spray Place 2 sprays into both nostrils daily. Patient not taking: Reported on 02/11/2017 06/11/16   Tenna Delaine D, PA-C  nitroGLYCERIN (NITROSTAT) 0.4 MG SL tablet Place 0.4 mg under the tongue every 5 (five) minutes as needed for chest pain. Reported on 09/21/2015    [provider]  predniSONE (DELTASONE) 20 MG tablet 3 by mouth for 3 days, then 2 by mouth for 2 days, then 1 by mouth for 2 days, then 1/2 by mouth for 2 days. Patient not taking: Reported on 02/11/2017 03/29/16   Wendie Agreste, MD   Social History   Social History  . Marital status: Single    Spouse name: N/A  . Number of children: N/A  . Years of education: N/A   Occupational History  . Realtor    Social History Main Topics  . Smoking  status: Former Smoker    Quit date: 08/14/1999  . Smokeless tobacco: Never Used  . Alcohol use 7.2 oz/week    12 Standard drinks or equivalent per week  . Drug use: No  . Sexual activity: Yes   Other Topics Concern  . Not on file   Social History Narrative   Single   Education: College   Exercise: Yes   Review of Systems 13 point ROS - positive for rash, and back pain    Objective:   Physical Exam  Constitutional: He is oriented to person, place, and time. He appears well-developed and well-nourished.  HENT:  Head: Normocephalic  and atraumatic.  Right Ear: External ear normal.  Left Ear: External ear normal.  Mouth/Throat: Oropharynx is clear and moist.  Eyes: Conjunctivae and EOM are normal. Pupils are equal, round, and reactive to light.  Neck: Normal range of motion. Neck supple. No thyromegaly present.  Cardiovascular: Normal rate, regular rhythm, normal heart sounds and intact distal pulses.   Pulmonary/Chest: Effort normal and breath sounds normal. No respiratory distress. He has no wheezes.  Abdominal: Soft. He exhibits no distension. There is no tenderness. Hernia confirmed negative in the right inguinal area and confirmed negative in the left inguinal area.  Genitourinary: Prostate normal.  Musculoskeletal: Normal range of motion. He exhibits no edema or tenderness.  L-spine: no midline bony tenderness, flexion approximately 70 degrees Able to heel-toe walk without difficulty Right 2nd toe: discolored toenails, slight prominence proximal aspect, no rash, minimal warmth  Lymphadenopathy:    He has no cervical adenopathy.  Neurological: He is alert and oriented to person, place, and time. He displays no Babinski's sign on the right side. He displays no Babinski's sign on the left side.  Reflex Scores:      Patellar reflexes are 1+ on the right side and 1+ on the left side.      Achilles reflexes are 1+ on the right side and 1+ on the left side. Skin: Skin is warm and  dry.  Neck: Erythematous papular lesions on the left anterior neck Arms: few scattered erythematous papules, R>L forearms  Psychiatric: He has a normal mood and affect. His behavior is normal.  Vitals reviewed.  Vitals:   02/11/17 1354  BP: 101/64  Pulse: (!) 58  Resp: 16  Temp: 97.7 F (36.5 C)  TempSrc: Oral  SpO2: 97%  Weight: 178 lb (80.7 kg)  Height: 5' 7.5" (1.715 m)      Assessment & Plan:    Jeremy Hendricks is a 61 y.o. male Annual physical exam - Plan: Care order/instruction:  - -anticipatory guidance as below in AVS, screening labs above. Health maintenance items as above in HPI discussed/recommended as applicable.   Need for diphtheria-tetanus-pertussis (Tdap) vaccine - Plan: Tdap vaccine greater than or equal to 7yo IM given  Need for shingles vaccine - Plan: Zoster Vac Recomb Adjuvanted [facility]) injection - sent to pharmacy.   Low back pain, unspecified back pain laterality, unspecified chronicity, with sciatica presence unspecified - Plan: cyclobenzaprine (FLEXERIL) 5 MG tablet  - low back strain. No red flags on hx or exam. Continue symptomatic care, Flexeril given, side effects discussed, RTC precautions if persistent or worsening as may need imaging.   Hypogonadism male - Plan: Testosterone, Free, Total, SHBG Medication monitoring encounter - Plan: PSA, CBC Screening for prostate cancer - Plan: PSA Screening, anemia, deficiency, iron - Plan: CBC  -Tolerating current dose of testosterone. Check PSA, CBC, lipid panel for monitoring. Check testosterone levels with free and SHBG.  -We discussed pros and cons of prostate cancer screening, and after this discussion, he chose to have screening done - especially with current use of replacement. PSA obtained, and no concerning findings on DRE.   Hyperlipidemia, unspecified hyperlipidemia type - Plan: Lipid panel, Comprehensive metabolic panel  - Check lipids, CMP. Continue Crestor, follow-up with cardiologist as it  appears he is likely due for follow-up.  Encounter for hepatitis C screening test for low risk patient - Plan: Hepatitis C antibody  Screen for colon cancer - Plan: Ambulatory referral to Gastroenterology  -Appears to be due for colonoscopy. Previous colonoscopy and  was in [facility].  Requests local provider. Referral placed.   Contact dermatitis due to poison ivy   - Okay to continue topical treatment over-the-counter, but if he does have lesions that move to his face, genitalia or continued spread, discussed prednisone taper. He has taken this previously without difficulty. I can send this in without OV if needed.  He briefly asked about medication for preexposure prophylaxis. Apparently he has sent me an email with these questions and concerns. Advised I would look for the email, and can discuss this further by mychart or can follow-up to discuss in office if needed.  Meds ordered this encounter  Medications  . Zoster Vac Recomb Adjuvanted [facility]) injection    Sig: Inject 0.5 mLs into the muscle once. Repeat injection once in 2-6 months.    Dispense:  0.5 mL    Refill:  1  . cyclobenzaprine (FLEXERIL) 5 MG tablet    Sig: 1 pill by mouth up to every 8 hours as needed. Start with one pill by mouth each bedtime as needed due to sedation    Dispense:  15 tablet    Refill:  0   Patient Instructions   For poison ivy, continue over the counter topical cortisone and ivy dry or calamine/caladryl.  If affects face, genitals, or continues to spread - let me know and I can send prednisone in.   For back pain, short term antiinflammatory or tylenol may be safer.  Muscle relaxant if needed. Return to the clinic or go to the nearest emergency room if any of your symptoms worsen or new symptoms occur.  You are due for repeat colonoscopy. I will refer you.   Shingles vaccine sent to your pharmacy.  I will look for the email about preexposure prophylaxis. If you have not heard form me in  next week - please send me a new message or return to discuss that further.    Keeping you healthy  Get these tests  Blood pressure- Have your blood pressure checked once a year by your healthcare provider.  Normal blood pressure is 120/80  Weight- Have your body mass index (BMI) calculated to screen for obesity.  BMI is a measure of body fat based on height and weight. You can also calculate your own BMI at [email or web address].[email or web address].  Cholesterol- Have your cholesterol checked every year.  Diabetes- Have your blood sugar checked regularly if you have high blood pressure, high cholesterol, have a family history of diabetes or if you are overweight.  Screening for Colon Cancer- Colonoscopy starting at age 95.  Screening may begin sooner depending on your family history and other health conditions. Follow up colonoscopy as directed by your Gastroenterologist.  Screening for Prostate Cancer- Both blood work (PSA) and a rectal exam help screen for Prostate Cancer.  Screening begins at age 53 with African-American men and at age 44 with Caucasian men.  Screening may begin sooner depending on your family history.  Take these medicines  Aspirin- One aspirin daily can help prevent Heart disease and Stroke.  Flu shot- Every fall.  Tetanus- Every 10 years.  Zostavax- Once after the age of 71 to prevent Shingles.  Pneumonia shot- Once after the age of 44; if you are younger than 26, ask your healthcare provider if you need a Pneumonia shot.  Take these steps  Don't smoke- If you do smoke, talk to your doctor about quitting.  For tips on how to quit, go to www.smokefree.gov or call 1-800-QUIT-NOW.  Be  physically active- Exercise 5 days a week for at least 30 minutes.  If you are not already physically active start slow and gradually work up to 30 minutes of moderate physical activity.  Examples of moderate activity include walking briskly, mowing the yard, dancing, swimming, bicycling,  etc.  Eat a healthy diet- Eat a variety of healthy food such as fruits, vegetables, low fat milk, low fat cheese, yogurt, lean meant, poultry, fish, beans, tofu, etc. For more information go to www.thenutritionsource.org  Drink alcohol in moderation- Limit alcohol intake to less than two drinks a day. Never drink and drive.  Dentist- Brush and floss twice daily; visit your dentist twice a year.  Depression- Your emotional health is as important as your physical health. If you're feeling down, or losing interest in things you would normally enjoy please talk to your healthcare provider.  Eye exam- Visit your eye doctor every year.  Safe sex- If you may be exposed to a sexually transmitted infection, use a condom.  Seat belts- Seat belts can save your life; always wear one.  Smoke/Carbon Monoxide detectors- These detectors need to be installed on the appropriate level of your home.  Replace batteries at least once a year.  Skin cancer- When out in the sun, cover up and use sunscreen 15 SPF or higher.  Violence- If anyone is threatening you, please tell your healthcare provider.  Living Will/ Health care power of attorney- Speak with your healthcare provider and family.     IF you received an x-ray today, you will receive an invoice from [facility]. Please contact [facility] at [phone] with questions or concerns regarding your invoice.   IF you received labwork today, you will receive an invoice from [place]. Please contact [facility] at [phone] with questions or concerns regarding your invoice.   Our billing staff will not be able to assist you with questions regarding bills from these companies.  You will be contacted with the lab results as soon as they are available. The fastest way to get your results is to activate your My Chart account. Instructions are located on the last page of this paperwork. If you have not heard from [place] regarding the results in  2 weeks, please contact this office.      I personally performed the services described in this documentation, which was scribed in my presence. The recorded information has been reviewed and considered for accuracy and completeness, addended by me as needed, and agree with information above.  Signed,   [name], MD [facility] [place].  02/11/17 7:24 PM

## 2017-02-11 NOTE — Patient Instructions (Addendum)
For poison ivy, continue over the counter topical cortisone and ivy dry or calamine/caladryl.  If affects face, genitals, or continues to spread - let me know and I can send prednisone in.   For back pain, short term antiinflammatory or tylenol may be safer.  Muscle relaxant if needed. Return to the clinic or go to the nearest emergency room if any of your symptoms worsen or new symptoms occur.  You are due for repeat colonoscopy. I will refer you.   Shingles vaccine sent to your pharmacy.  I will look for the email about preexposure prophylaxis. If you have not heard form me in next week - please send me a new message or return to discuss that further.    Keeping you healthy  Get these tests  Blood pressure- Have your blood pressure checked once a year by your healthcare provider.  Normal blood pressure is 120/80  Weight- Have your body mass index (BMI) calculated to screen for obesity.  BMI is a measure of body fat based on height and weight. You can also calculate your own BMI at [employer].  Cholesterol- Have your cholesterol checked every year.  Diabetes- Have your blood sugar checked regularly if you have high blood pressure, high cholesterol, have a family history of diabetes or if you are overweight.  Screening for Colon Cancer- Colonoscopy starting at age 11.  Screening may begin sooner depending on your family history and other health conditions. Follow up colonoscopy as directed by your Gastroenterologist.  Screening for Prostate Cancer- Both blood work (PSA) and a rectal exam help screen for Prostate Cancer.  Screening begins at age 11 with African-American men and at age 34 with Caucasian men.  Screening may begin sooner depending on your family history.  Take these medicines  Aspirin- One aspirin daily can help prevent Heart disease and Stroke.  Flu shot- Every fall.  Tetanus- Every 10 years.  Zostavax- Once after the age of 10 to prevent  Shingles.  Pneumonia shot- Once after the age of 74; if you are younger than 8, ask your healthcare provider if you need a Pneumonia shot.  Take these steps  Don't smoke- If you do smoke, talk to your doctor about quitting.  For tips on how to quit, go to www.smokefree.gov or call 1-800-QUIT-NOW.  Be physically active- Exercise 5 days a week for at least 30 minutes.  If you are not already physically active start slow and gradually work up to 30 minutes of moderate physical activity.  Examples of moderate activity include walking briskly, mowing the yard, dancing, swimming, bicycling, etc.  Eat a healthy diet- Eat a variety of healthy food such as fruits, vegetables, low fat milk, low fat cheese, yogurt, lean meant, poultry, fish, beans, tofu, etc. For more information go to www.thenutritionsource.org  Drink alcohol in moderation- Limit alcohol intake to less than two drinks a day. Never drink and drive.  Dentist- Brush and floss twice daily; visit your dentist twice a year.  Depression- Your emotional health is as important as your physical health. If you're feeling down, or losing interest in things you would normally enjoy please talk to your healthcare provider.  Eye exam- Visit your eye doctor every year.  Safe sex- If you may be exposed to a sexually transmitted infection, use a condom.  Seat belts- Seat belts can save your life; always wear one.  Smoke/Carbon Monoxide detectors- These detectors need to be installed on the appropriate level of your home.  Replace batteries at  least once a year.  Skin cancer- When out in the sun, cover up and use sunscreen 15 SPF or higher.  Violence- If anyone is threatening you, please tell your healthcare provider.  Living Will/ Health care power of attorney- Speak with your healthcare provider and family.     IF you received an x-ray today, you will receive an invoice from [facility]. Please contact [facility] at  [phone] with questions or concerns regarding your invoice.   IF you received labwork today, you will receive an invoice from [place]. Please contact [facility] at [phone] with questions or concerns regarding your invoice.   Our billing staff will not be able to assist you with questions regarding bills from these companies.  You will be contacted with the lab results as soon as they are available. The fastest way to get your results is to activate your My Chart account. Instructions are located on the last page of this paperwork. If you have not heard from [place] regarding the results in 2 weeks, please contact this office.

## 2017-02-12 LAB — CBC
HEMATOCRIT: 45.3 % (ref 37.5–51.0)
Hemoglobin: 15.3 g/dL (ref 13.0–17.7)
MCH: 30.9 pg (ref 26.6–33.0)
MCHC: 33.8 g/dL (ref 31.5–35.7)
MCV: 92 fL (ref 79–97)
PLATELETS: 259 10*3/uL (ref 150–379)
RBC: 4.95 x10E6/uL (ref 4.14–5.80)
RDW: 13.1 % (ref 12.3–15.4)
WBC: 7 10*3/uL (ref 3.4–10.8)

## 2017-02-12 LAB — COMPREHENSIVE METABOLIC PANEL
ALBUMIN: 4.9 g/dL — AB (ref 3.6–4.8)
ALT: 15 IU/L (ref 0–44)
AST: 20 IU/L (ref 0–40)
Albumin/Globulin Ratio: 2 (ref 1.2–2.2)
Alkaline Phosphatase: 63 IU/L (ref 39–117)
BILIRUBIN TOTAL: 0.7 mg/dL (ref 0.0–1.2)
BUN / CREAT RATIO: 16 (ref 10–24)
BUN: 20 mg/dL (ref 8–27)
CALCIUM: 9.4 mg/dL (ref 8.6–10.2)
CHLORIDE: 99 mmol/L (ref 96–106)
CO2: 24 mmol/L (ref 20–29)
CREATININE: 1.22 mg/dL (ref 0.76–1.27)
GFR, EST AFRICAN AMERICAN: 74 mL/min/{1.73_m2} (ref >59)
GFR, EST NON AFRICAN AMERICAN: 64 mL/min/{1.73_m2} (ref >59)
GLUCOSE: 89 mg/dL (ref 65–99)
Globulin, Total: 2.5 g/dL (ref 1.5–4.5)
Potassium: 4.1 mmol/L (ref 3.5–5.2)
Sodium: 140 mmol/L (ref 134–144)
TOTAL PROTEIN: 7.4 g/dL (ref 6.0–8.5)

## 2017-02-12 LAB — TESTOSTERONE, FREE, TOTAL, SHBG
SEX HORMONE BINDING: 29.1 nmol/L (ref 19.3–76.4)
TESTOSTERONE FREE: 25.9 pg/mL — AB (ref 6.6–18.1)
Testosterone: 1095 ng/dL — ABNORMAL HIGH (ref 264–916)

## 2017-02-12 LAB — LIPID PANEL
CHOL/HDL RATIO: 3 ratio (ref 0.0–5.0)
Cholesterol, Total: 112 mg/dL (ref 100–199)
HDL: 37 mg/dL — ABNORMAL LOW (ref >39)
LDL Calculated: 56 mg/dL (ref 0–99)
Triglycerides: 93 mg/dL (ref 0–149)
VLDL Cholesterol Cal: 19 mg/dL (ref 5–40)

## 2017-02-12 LAB — HEPATITIS C ANTIBODY: Hep C Virus Ab: <0.1 s/co ratio (ref 0.0–0.9)

## 2017-02-12 LAB — PSA: Prostate Specific Ag, Serum: 2.4 ng/mL (ref 0.0–4.0)

## 2017-02-15 ENCOUNTER — Encounter: Payer: Self-pay | Admitting: Family Medicine

## 2017-02-20 ENCOUNTER — Encounter: Payer: Self-pay | Admitting: Family Medicine

## 2017-02-22 ENCOUNTER — Other Ambulatory Visit: Payer: Self-pay | Admitting: Family Medicine

## 2017-02-22 MED ORDER — TESTOSTERONE 50 MG/5GM (1%) TD GEL: TRANSDERMAL | 1 refills | Status: DC

## 2017-02-22 NOTE — Progress Notes (Signed)
See lab notes and last visit. Refilled testosterone, but should check levels in 6 weeks.

## 2017-02-27 ENCOUNTER — Other Ambulatory Visit: Payer: Self-pay | Admitting: Cardiovascular Disease

## 2017-03-03 ENCOUNTER — Encounter: Payer: Self-pay | Admitting: Family Medicine

## 2017-03-04 ENCOUNTER — Ambulatory Visit (INDEPENDENT_AMBULATORY_CARE_PROVIDER_SITE_OTHER): Payer: BC Managed Care – PPO | Admitting: Physician Assistant

## 2017-03-04 ENCOUNTER — Encounter: Payer: Self-pay | Admitting: Physician Assistant

## 2017-03-04 ENCOUNTER — Ambulatory Visit (INDEPENDENT_AMBULATORY_CARE_PROVIDER_SITE_OTHER): Payer: BC Managed Care – PPO

## 2017-03-04 VITALS — BP 148/84 | HR 66 | Temp 98.2°F | Resp 18 | Ht 67.5 in | Wt 183.0 lb

## 2017-03-04 DIAGNOSIS — M25561 Pain in right knee: Secondary | ICD-10-CM | POA: Diagnosis not present

## 2017-03-04 DIAGNOSIS — M25461 Effusion, right knee: Secondary | ICD-10-CM

## 2017-03-04 LAB — POCT CBC
Granulocyte percent: 77.8 %G (ref 37–80)
HCT, POC: 44.3 % (ref 43.5–53.7)
Hemoglobin: 15.3 g/dL (ref 14.1–18.1)
Lymph, poc: 1.5 (ref 0.6–3.4)
MCH: 30.2 pg (ref 27–31.2)
MCHC: 34.5 g/dL (ref 31.8–35.4)
MCV: 87.4 fL (ref 80–97)
MID (CBC): 1.1 — AB (ref 0–0.9)
MPV: 7.9 fL (ref 0–99.8)
POC Granulocyte: 9.3 — AB (ref 2–6.9)
POC LYMPH PERCENT: 12.9 %L (ref 10–50)
POC MID %: 9.3 % (ref 0–12)
Platelet Count, POC: 277 10*3/uL (ref 142–424)
RBC: 5.06 M/uL (ref 4.69–6.13)
RDW, POC: 12.8 %
WBC: 11.9 10*3/uL — AB (ref 4.6–10.2)

## 2017-03-04 MED ORDER — PREDNISONE 20 MG PO TABS: ORAL_TABLET | ORAL | 0 refills | Status: DC

## 2017-03-04 MED ORDER — HYDROCODONE-ACETAMINOPHEN 5-325 MG PO TABS: 1.0000 | ORAL_TABLET | Freq: Four times a day (QID) | ORAL | 0 refills | Status: DC | PRN

## 2017-03-04 NOTE — Progress Notes (Signed)
PRIMARY CARE AT [facility] [address] [place] [address] [phone]  Date:  03/04/2017   Name:  Jeremy Hendricks   DOB:  1956-08-08   MRN:  [id number]  PCP:  Wendie Agreste, MD    History of Present Illness:  Jeremy Hendricks is a 61 y.o. male patient who presents to PCP with  Chief Complaint  Patient presents with  . Gout    right knee x3days    patient reports knee pain that started about 3 days ago.  The pain was very mild.  He was able to perform a 4 mile hike.  This same day prior to symptoms, he had a steak 4 days ago and substantial amount of beer.  2 days ago, he had oysters.  Over these last 3 days the pain became progressively worse.  The right knee is warm and swollen.  More tender at the front of his knee.  Very difficult to bend.  He has not been able to ambulate for 2 days secondary to the pain.  He denies fever or malaise.  He has a hx of gout that generally appears at the great toes.  He does not recall any trauma or pivoting wrong during this hike.    Patient Active Problem List   Diagnosis Date Noted  . 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 HLD (hyperlipidemia) 06/11/2016  . Hypogonadism in male 09/09/2015  . INSOMNIA 09/19/2010  . SHORTNESS OF BREATH 09/19/2010  . CHEST PAIN-PRECORDIAL 08/23/2010  . CAD, ARTERY BYPASS GRAFT 10/26/2009  . HYPERLIPIDEMIA 10/24/2009  . DEGENERATIVE JOINT DISEASE 10/24/2009  . ANGINA, HX OF 10/24/2009    Past Medical History:  Diagnosis Date  . Atrial fibrillation ([place])    postoperative  . Coronary artery disease    s/p CABG 2002, redo 2012  . DJD (degenerative joint disease)   . History of ETT    a. ETT 6/16:  normal    Past Surgical History:  Procedure Laterality Date  . CORONARY ARTERY BYPASS GRAFT  2002  . CORONARY ARTERY BYPASS GRAFT  08-2010   L-LAD remained from original CABG; new grafts incl L radial- PDA + RIMA-RI  . VASECTOMY      Social History  Substance Use Topics  . Smoking status: Former Smoker    Quit date:  08/14/1999  . Smokeless tobacco: Never Used  . Alcohol use 7.2 oz/week    12 Standard drinks or equivalent per week    Family History  Problem Relation Age of Onset  . Heart attack Father 51  . Hypertension Father   . Cancer Mother        Breast cancer  . Cancer Unknown        family hx of  . Coronary artery disease Unknown        family hx of  . Hyperlipidemia Unknown        family hx of  . Hypertension Paternal Grandfather   . Stroke Neg Hx     No Known Allergies  Medication list has been reviewed and updated.  Current Outpatient Prescriptions on File Prior to Visit  Medication Sig Dispense Refill  . aspirin 325 MG tablet Take 325 mg by mouth daily.      Marland Kitchen atenolol (TENORMIN) 25 MG tablet Take 1 tablet (25 mg total) by mouth daily. 90 tablet 3  . cyclobenzaprine (FLEXERIL) 5 MG tablet 1 pill by mouth up to every 8 hours as needed. Start with one pill by mouth each bedtime  as needed due to sedation 15 tablet 0  . furosemide (LASIX) 40 MG tablet take  40 mg one day alternating with BID the next day 135 tablet 3  . Multiple Vitamin (MULTIVITAMIN) tablet Take 1 tablet by mouth daily.      . nitroGLYCERIN (NITROSTAT) 0.4 MG SL tablet Place 0.4 mg under the tongue every 5 (five) minutes as needed for chest pain. Reported on 09/21/2015    . rosuvastatin (CRESTOR) 40 MG tablet Take 1 tablet (40 mg total) by mouth daily. 30 tablet 0  . testosterone (ANDROGEL) 50 MG/5GM (1%) GEL USE 1/2 TUBE ONTO THE SKIN DAILY 150 g 1   No current facility-administered medications on file prior to visit.     ROS ROS otherwise unremarkable unless listed above.  Physical Examination: BP (!) 148/84   Pulse 66   Temp 98.2 F (36.8 C) (Oral)   Resp 18   Ht 5' 7.5" (1.715 m)   Wt 183 lb (83 kg)   SpO2 99%   BMI 28.24 kg/m  Ideal Body Weight: Weight in (lb) to have BMI = 25: 161.7  Physical Exam  Constitutional: He is oriented to person, place, and time. He appears well-developed and  well-nourished. No distress.  HENT:  Head: Normocephalic and atraumatic.  Eyes: Pupils are equal, round, and reactive to light. Conjunctivae and EOM are normal.  Cardiovascular: Normal rate.   Pulmonary/Chest: Effort normal. No respiratory distress.  Musculoskeletal:       Right knee: He exhibits decreased range of motion, swelling, effusion and erythema. No medial joint line and no lateral joint line tenderness noted.  Tender at the patella and surrounding lateral edges.    Neurological: He is alert and oriented to person, place, and time.  Skin: Skin is warm and dry. He is not diaphoretic.  Psychiatric: He has a normal mood and affect. His behavior is normal.   Results for orders placed or performed in visit on 03/04/17  POCT CBC  Result Value Ref Range   WBC 11.9 (A) 4.6 - 10.2 K/uL   Lymph, poc 1.5 0.6 - 3.4   POC LYMPH PERCENT 12.9 10 - 50 %L   MID (cbc) 1.1 (A) 0 - 0.9   POC MID % 9.3 0 - 12 %M   POC Granulocyte 9.3 (A) 2 - 6.9   Granulocyte percent 77.8 37 - 80 %G   RBC 5.06 4.69 - 6.13 M/uL   Hemoglobin 15.3 14.1 - 18.1 g/dL   HCT, POC 44.3 43.5 - 53.7 %   MCV 87.4 80 - 97 fL   MCH, POC 30.2 27 - 31.2 pg   MCHC 34.5 31.8 - 35.4 g/dL   RDW, POC 12.8 %   Platelet Count, POC 277 142 - 424 K/uL   MPV 7.9 0 - 99.8 fL    Dg Knee Complete 4 Views Right  Result Date: 03/04/2017 CLINICAL DATA:  Patient with right knee pain, swelling. EXAM: RIGHT KNEE - COMPLETE 4+ VIEW COMPARISON:  None. FINDINGS: Normal anatomic alignment. No evidence for acute fracture or dislocation. Regional soft tissues are unremarkable. No definite joint effusion. IMPRESSION: No acute osseous abnormality. Electronically Signed   By: Lovey Newcomer M.D.   On: 03/04/2017 09:33     Assessment and Plan: Jeremy Hendricks is a 61 y.o. male who is here today for right knee pain and swelling. Fluid was drained by Dr. Rodena Piety to note.  He notes some improvement of the pain. Sending off for confirmation of  gouty fluid.  Starting prednisone taper.  Advised ice, and given opiate for pain control.    Effusion of right knee - Plan: Synovial fluid, cell count  Acute pain of right knee - Plan: DG Knee Complete 4 Views Right, POCT CBC, Synovial fluid, cell count  Ivar Drape, PA-C [facility] 7/24/20188:46 AM

## 2017-03-04 NOTE — Patient Instructions (Addendum)
Gout Gout is painful swelling that can happen in some of your joints. Gout is a type of arthritis. This condition is caused by having too much uric acid in your body. Uric acid is a chemical that is made when your body breaks down substances called purines. If your body has too much uric acid, sharp crystals can form and build up in your joints. This causes pain and swelling. Gout attacks can happen quickly and be very painful (acute gout). Over time, the attacks can affect more joints and happen more often (chronic gout). Follow these instructions at home: During a Gout Attack  If directed, put ice on the painful area: ? Put ice in a plastic bag. ? Place a towel between your skin and the bag. ? Leave the ice on for 20 minutes, 2-3 times a day.  Rest the joint as much as possible. If the joint is in your leg, you may be given crutches to use.  Raise (elevate) the painful joint above the level of your heart as often as you can.  Drink enough fluids to keep your pee (urine) clear or pale yellow.  Take over-the-counter and prescription medicines only as told by your doctor.  Do not drive or use heavy machinery while taking prescription pain medicine.  Follow instructions from your doctor about what you can or cannot eat and drink.  Return to your normal activities as told by your doctor. Ask your doctor what activities are safe for you. Avoiding Future Gout Attacks  Follow a low-purine diet as told by a specialist (dietitian) or your doctor. Avoid foods and drinks that have a lot of purines, such as: ? Liver. ? Kidney. ? Anchovies. ? Asparagus. ? Herring. ? Mushrooms ? Mussels. ? Beer.  Limit alcohol intake to no more than 1 drink a day for nonpregnant women and 2 drinks a day for men. One drink equals 12 oz of beer, 5 oz of wine, or 1 oz of hard liquor.  Stay at a healthy weight or lose weight if you are overweight. If you want to lose weight, talk with your doctor. It is  important that you do not lose weight too fast.  Start or continue an exercise plan as told by your doctor.  Drink enough fluids to keep your pee clear or pale yellow.  Take over-the-counter and prescription medicines only as told by your doctor.  Keep all follow-up visits as told by your doctor. This is important. Contact a doctor if:  You have another gout attack.  You still have symptoms of a gout attack after10 days of treatment.  You have problems (side effects) because of your medicines.  You have chills or a fever.  You have burning pain when you pee (urinate).  You have pain in your lower back or belly. Get help right away if:  You have very bad pain.  Your pain cannot be controlled.  You cannot pee. This information is not intended to replace advice given to you by your health care provider. Make sure you discuss any questions you have with your health care provider. Document Released: 05/08/2008 Document Revised: 01/05/2016 Document Reviewed: 05/12/2015 Elsevier Interactive Patient Education  2018 Elsevier Inc.     IF you received an x-ray today, you will receive an invoice from [facility]. Please contact [facility] at [phone] with questions or concerns regarding your invoice.   IF you received labwork today, you will receive an invoice from [facility]. Please contact [facility] at [phone]   with questions or concerns regarding your invoice.   Our billing staff will not be able to assist you with questions regarding bills from these companies.  You will be contacted with the lab results as soon as they are available. The fastest way to get your results is to activate your My Chart account. Instructions are located on the last page of this paperwork. If you have not heard from [place] regarding the results in 2 weeks, please contact this office.

## 2017-03-05 ENCOUNTER — Telehealth: Payer: Self-pay

## 2017-03-05 LAB — SYNOVIAL FLUID, CELL COUNT
Eos, Fluid: 0 %
LYMPHS FL: 5 %
Lining Cells, Synovial: 0 %
MACROPHAGES FLD: 31 %
NUC CELL # FLD: 14302 {cells}/uL — AB (ref 0–200)
POLYS FL: 64 %

## 2017-03-05 NOTE — Progress Notes (Signed)
Examined with Ivar Drape, PA-C. Right knee effusion with diffuse tenderness, no skin lesions or wounds somewhat guarded range of motion. Suspected gout flare, less likely infection.  Risks (including but not limited to bleeding and infection, damage to underlying structures), benefits, and alternatives discussed for R knee aspiration .  Verbal consent obtained after any questions were answered. Landmarks noted, and marked as needed for superolateral approach. Area cleansed with Betadine x3, ethyl chloride spray for topical anesthesia, followed by alcohol swab. Aspirated 20cc clear yellow fluid, but restriction to further aspiration. Attempted redirection, but significant discomfort with exam, so needle withdrawn. No steroid was injected. No complications. Bandage applied.  RTC precautions discussed in regards to injection.

## 2017-03-05 NOTE — Telephone Encounter (Signed)
Called pharmacy. States they will resend over PA form for Androgel

## 2017-03-07 ENCOUNTER — Other Ambulatory Visit: Payer: Self-pay | Admitting: Cardiovascular Disease

## 2017-03-07 NOTE — Telephone Encounter (Signed)
PA started for androgel. Additional Information required. Awaiting Fax from insurance

## 2017-03-08 ENCOUNTER — Encounter: Payer: Self-pay | Admitting: Family Medicine

## 2017-03-09 ENCOUNTER — Other Ambulatory Visit: Payer: Self-pay | Admitting: Family Medicine

## 2017-03-09 ENCOUNTER — Telehealth: Payer: Self-pay | Admitting: *Deleted

## 2017-03-09 DIAGNOSIS — R7989 Other specified abnormal findings of blood chemistry: Secondary | ICD-10-CM

## 2017-03-09 MED ORDER — TESTOSTERONE 30 MG/ACT TD SOLN: 60.0000 mg | TRANSDERMAL | 2 refills | Status: DC

## 2017-03-09 NOTE — Telephone Encounter (Signed)
Faxed Rx for testosterone to [facility]. Confirmation page received at [phone].

## 2017-03-09 NOTE — Progress Notes (Signed)
AndroGel not covered by insurance. Request for either Androderm patch or Axiron. Axiron was prescribed 60 mg every morning. Plan for recheck testosterone levels in 6 weeks.

## 2017-03-11 NOTE — Telephone Encounter (Signed)
Androgel Denied. I will place the Denial Notice in your box today. Thank you

## 2017-03-12 ENCOUNTER — Telehealth: Payer: Self-pay

## 2017-03-12 NOTE — Telephone Encounter (Signed)
Dr. Carlota Raspberry,  Mr. Gwaltney called about the status of PA for Androgel, I informed him that it was denied. I saw on 7/28 a testosterone patch was ordered. Is this patch to replace the androgel gel? Please advise.

## 2017-03-12 NOTE — Telephone Encounter (Signed)
Yes. I sent in testosterone solution 60 mg daily. That was apparently authorized by review of insurance paperwork, not AndroGel.

## 2017-03-13 NOTE — Telephone Encounter (Signed)
Patient called and I gave him the message from Dr. Carlota Raspberry.  He was advised to check back with pharmacy tomorrow, and if they still don't have it then he can call me back.  I will try to resolve issue for him.  He said he would do this.

## 2017-03-15 NOTE — Telephone Encounter (Signed)
Was denial on androgel or testosterone 30mg /actuation solution? It was my understanding that this solution was covered by insurance.

## 2017-03-15 NOTE — Telephone Encounter (Signed)
Received Denial from Cover my meds today. Notified pharmacy.  They will notify the patient.

## 2017-03-18 ENCOUNTER — Encounter: Payer: Self-pay | Admitting: Family Medicine

## 2017-03-18 NOTE — Telephone Encounter (Signed)
Dr. Carlota Raspberry, The testosterone solution that is on medlist is 30 mg not 60 mg.  I called again to pharmacy and it was denied through cover my meds.   I will do whatever you need me to do to correct this issue.  Please advise if it requires further attention.  Thank you, Jailin Manocchio

## 2017-03-18 NOTE — Telephone Encounter (Signed)
Axiron (30mg /actuation formulation) - apply 2 actuations per day (that is where the 60mg  dose comes in, which is recommended dosing). Please ask pharmacist how I can order that differently as that is listed as covered medication.

## 2017-03-27 NOTE — Telephone Encounter (Signed)
Please see my chart messages as well as telephone message regarding prior authorization. I have copied my most recent reply on August 6th sent to the prior authorization pool. If he has not been able to receive this, can we please check with pharmacy and see what the issue is? My understanding is that I wrote for the approved medication.  Thanks,  -JG  "Axiron (30mg /actuation formulation) - apply 2 actuations per day (that is where the 60mg  dose comes in, which is recommended dosing). Please ask pharmacist how I can order that differently as that is listed as covered medication"

## 2017-03-28 NOTE — Telephone Encounter (Signed)
Patient called requesting his testosterone be sent to [facility] when I called, the pharmacist told me that it is still reading as a denial and she gave me an appeal number to call.  I had her run a test claim for me.  It was denied.  I will start appeal. Called again to Ins Co and again received PA on the testosterone gel. It is approved from today until 03/28/2020. Patient and pharmacy aware.

## 2017-03-31 ENCOUNTER — Other Ambulatory Visit: Payer: Self-pay | Admitting: Cardiovascular Disease

## 2017-04-16 ENCOUNTER — Other Ambulatory Visit: Payer: Self-pay | Admitting: Cardiovascular Disease

## 2017-04-18 ENCOUNTER — Other Ambulatory Visit: Payer: Self-pay | Admitting: Cardiovascular Disease

## 2017-04-23 ENCOUNTER — Encounter: Payer: Self-pay | Admitting: Family Medicine

## 2017-05-01 ENCOUNTER — Other Ambulatory Visit: Payer: Self-pay

## 2017-05-01 NOTE — Progress Notes (Unsigned)
See his most recent Mychart message this afternoon. It appears he has gotten that information straightened out with his insurance company. Can refer him for colonoscopy as referred in July. (not sure about the in office procedure, that would have to be discussed with gastroenterology).  It appears he was referred to [facility] previously. Do we need a new referral, or can we just contact their office to schedule at this time?

## 2017-05-01 NOTE — Progress Notes (Unsigned)
Dr. Carlota Raspberry,  Please review the MyChart correspondance with this patient.  I have conferred with Referrals, Deneise Lever and she agrees with my last note to pt - his insurance co has to approve his colonoscopy - feel our hands are tied at this time.  Thanks, Kalman Shan

## 2017-05-02 ENCOUNTER — Encounter: Payer: Self-pay | Admitting: Gastroenterology

## 2017-05-02 ENCOUNTER — Telehealth: Payer: Self-pay | Admitting: Family Medicine

## 2017-05-02 NOTE — Telephone Encounter (Signed)
Spoke with [facility] Evans and they are able to use the July referral. I called the pt to let him know this and gave him their number to set up an appt. Thanks!

## 2017-05-18 ENCOUNTER — Other Ambulatory Visit: Payer: Self-pay | Admitting: Cardiovascular Disease

## 2017-06-09 ENCOUNTER — Other Ambulatory Visit: Payer: Self-pay | Admitting: Cardiovascular Disease

## 2017-06-19 ENCOUNTER — Encounter: Payer: Self-pay | Admitting: Gastroenterology

## 2017-06-19 ENCOUNTER — Ambulatory Visit (AMBULATORY_SURGERY_CENTER): Payer: Self-pay

## 2017-06-19 VITALS — Ht 67.5 in | Wt 186.6 lb

## 2017-06-19 DIAGNOSIS — Z1211 Encounter for screening for malignant neoplasm of colon: Secondary | ICD-10-CM

## 2017-06-19 MED ORDER — SUPREP BOWEL PREP KIT 17.5-3.13-1.6 GM/177ML PO SOLN: 1.0000 | Freq: Once | ORAL | 0 refills | Status: AC

## 2017-06-19 NOTE — Progress Notes (Signed)
No allergies to eggs or soy No past problems with anesthesia No diet meds No home oxygen  Declined emmi 

## 2017-06-21 ENCOUNTER — Other Ambulatory Visit: Payer: Self-pay | Admitting: Cardiovascular Disease

## 2017-06-28 ENCOUNTER — Other Ambulatory Visit: Payer: Self-pay | Admitting: Cardiovascular Disease

## 2017-06-28 MED ORDER — FUROSEMIDE 40 MG PO TABS: ORAL_TABLET | ORAL | 0 refills | Status: DC

## 2017-07-01 ENCOUNTER — Ambulatory Visit (AMBULATORY_SURGERY_CENTER): Payer: BC Managed Care – PPO | Admitting: Gastroenterology

## 2017-07-01 ENCOUNTER — Encounter: Payer: Self-pay | Admitting: Gastroenterology

## 2017-07-01 ENCOUNTER — Other Ambulatory Visit: Payer: Self-pay

## 2017-07-01 VITALS — BP 121/73 | HR 70 | Temp 96.9°F | Resp 23 | Ht 67.5 in | Wt 186.0 lb

## 2017-07-01 DIAGNOSIS — Z1212 Encounter for screening for malignant neoplasm of rectum: Secondary | ICD-10-CM

## 2017-07-01 DIAGNOSIS — Z1211 Encounter for screening for malignant neoplasm of colon: Secondary | ICD-10-CM

## 2017-07-01 MED ORDER — SODIUM CHLORIDE 0.9 % IV SOLN: 500.0000 mL | INTRAVENOUS | Status: DC

## 2017-07-01 NOTE — Progress Notes (Signed)
To pACU, VSS. Report to [email or web address]

## 2017-07-01 NOTE — Patient Instructions (Signed)
YOU HAD AN ENDOSCOPIC PROCEDURE TODAY AT THE [facility]:   Refer to the procedure report that was given to you for any specific questions about what was found during the examination.  If the procedure report does not answer your questions, please call your gastroenterologist to clarify.  If you requested that your care partner not be given the details of your procedure findings, then the procedure report has been included in a sealed envelope for you to review at your convenience later.  YOU SHOULD EXPECT: Some feelings of bloating in the abdomen. Passage of more gas than usual.  Walking can help get rid of the air that was put into your GI tract during the procedure and reduce the bloating. If you had a lower endoscopy (such as a colonoscopy or flexible sigmoidoscopy) you may notice spotting of blood in your stool or on the toilet paper. If you underwent a bowel prep for your procedure, you may not have a normal bowel movement for a few days.  Please Note:  You might notice some irritation and congestion in your nose or some drainage.  This is from the oxygen used during your procedure.  There is no need for concern and it should clear up in a day or so.  SYMPTOMS TO REPORT IMMEDIATELY:   Following lower endoscopy (colonoscopy or flexible sigmoidoscopy):  Excessive amounts of blood in the stool  Significant tenderness or worsening of abdominal pains  Swelling of the abdomen that is new, acute  Fever of 100F or higher  For urgent or emergent issues, a gastroenterologist can be reached at any hour by calling [phone].   DIET:  We do recommend a small meal at first, but then you may proceed to your regular diet.  Drink plenty of fluids but you should avoid alcoholic beverages for 24 hours.  ACTIVITY:  You should plan to take it easy for the rest of today and you should NOT DRIVE or use heavy machinery until tomorrow (because of the sedation medicines used during the test).     FOLLOW UP: Our staff will call the number listed on your records the next business day following your procedure to check on you and address any questions or concerns that you may have regarding the information given to you following your procedure. If we do not reach you, we will leave a message.  However, if you are feeling well and you are not experiencing any problems, there is no need to return our call.  We will assume that you have returned to your regular daily activities without incident.  If any biopsies were taken you will be contacted by phone or by letter within the next 1-3 weeks.  Please call us at [phone] if you have not heard about the biopsies in 3 weeks.    SIGNATURES/CONFIDENTIALITY: You and/or your care partner have signed paperwork which will be entered into your electronic medical record.  These signatures attest to the fact that that the information above on your After Visit Summary has been reviewed and is understood.  Full responsibility of the confidentiality of this discharge information lies with you and/or your care-partner. 

## 2017-07-01 NOTE — Op Note (Signed)
[facility] Patient Name: Brylon Brenning Procedure Date: 07/01/2017 9:03 AM MRN: [id number] Endoscopist: Baker. Loletha Carrow , MD Age: 61 Referring MD:  Date of Birth: 21-Jun-1956 Gender: Male Account #: [id number] Procedure:                Colonoscopy Indications:              Screening for colorectal malignant neoplasm (no                            polyps on 11/2006 colonoscopy at outside institution) Medicines:                Monitored Anesthesia Care Procedure:                Pre-Anesthesia Assessment:                           - Prior to the procedure, a History and Physical                            was performed, and patient medications and                            allergies were reviewed. The patient's tolerance of                            previous anesthesia was also reviewed. The risks                            and benefits of the procedure and the sedation                            options and risks were discussed with the patient.                            All questions were answered, and informed consent                            was obtained. Anticoagulants: The patient has taken                            aspirin. It was decided not to withhold this                            medication prior to the procedure. ASA Grade                            Assessment: III - A patient with severe systemic                            disease. After reviewing the risks and benefits,                            the patient was deemed in satisfactory condition to  undergo the procedure.                           After obtaining informed consent, the colonoscope                            was passed under direct vision. Throughout the                            procedure, the patient's blood pressure, pulse, and                            oxygen saturations were monitored continuously. The                            Colonoscope was introduced through  the anus and                            advanced to the the cecum, identified by                            appendiceal orifice and ileocecal valve. The                            colonoscopy was performed without difficulty. The                            patient tolerated the procedure well. The quality                            of the bowel preparation was good. The ileocecal                            valve, appendiceal orifice, and rectum were                            photographed. The quality of the bowel preparation                            was evaluated using the BBPS [facility] Bowel                            Preparation Scale) with scores of: Right Colon = 2,                            Transverse Colon = 2 and Left Colon = 2. The total                            BBPS score equals 6. The bowel preparation used was                            SUPREP. Scope In: 9:11:05 AM Scope Out: 9:25:32 AM Scope Withdrawal Time: 0 hours 11 minutes 17 seconds  Total Procedure Duration:  0 hours 14 minutes 27 seconds  Findings:                 The perianal and digital rectal examinations were                            normal.                           The entire examined colon appeared normal on direct                            and retroflexion views. Complications:            No immediate complications. Estimated Blood Loss:     Estimated blood loss: none. Impression:               - The entire examined colon is normal on direct and                            retroflexion views.                           - No specimens collected. Recommendation:           - Patient has a contact number available for                            emergencies. The signs and symptoms of potential                            delayed complications were discussed with the                            patient. Return to normal activities tomorrow.                            Written discharge instructions were provided  to the                            patient.                           - Resume previous diet.                           - Continue present medications.                           - Repeat colonoscopy in 10 years for screening                            purposes. Forestine Macho L. Loletha Carrow, MD 07/01/2017 9:28:52 AM This report has been signed electronically.

## 2017-07-02 ENCOUNTER — Telehealth: Payer: Self-pay

## 2017-07-02 NOTE — Telephone Encounter (Signed)
  Follow up Call-  Call back number 07/01/2017  Post procedure Call Back phone  # [phone]  Permission to leave phone message Yes  Some recent data might be hidden     Patient questions:  Do you have a fever, pain , or abdominal swelling? No. Pain Score  0 *  Have you tolerated food without any problems? Yes.    Have you been able to return to your normal activities? Yes.    Do you have any questions about your discharge instructions: Diet   No. Medications  No. Follow up visit  No.  Do you have questions or concerns about your Care? No.  Actions: * If pain score is 4 or above: No action needed, pain <4.

## 2017-07-07 ENCOUNTER — Other Ambulatory Visit: Payer: Self-pay | Admitting: Cardiovascular Disease

## 2017-07-12 ENCOUNTER — Other Ambulatory Visit: Payer: Self-pay | Admitting: Cardiovascular Disease

## 2017-07-25 ENCOUNTER — Other Ambulatory Visit: Payer: Self-pay | Admitting: Cardiovascular Disease

## 2017-07-26 ENCOUNTER — Other Ambulatory Visit: Payer: Self-pay | Admitting: Cardiovascular Disease

## 2017-07-29 ENCOUNTER — Encounter: Payer: Self-pay | Admitting: Family Medicine

## 2017-08-01 ENCOUNTER — Telehealth: Payer: Self-pay | Admitting: Cardiovascular Disease

## 2017-08-01 MED ORDER — ROSUVASTATIN CALCIUM 40 MG PO TABS: 40.0000 mg | ORAL_TABLET | Freq: Every day | ORAL | 0 refills | Status: DC

## 2017-08-01 MED ORDER — ATENOLOL 25 MG PO TABS: 25.0000 mg | ORAL_TABLET | Freq: Every day | ORAL | 0 refills | Status: DC

## 2017-08-01 MED ORDER — FUROSEMIDE 40 MG PO TABS: ORAL_TABLET | ORAL | 0 refills | Status: DC

## 2017-08-01 NOTE — Telephone Encounter (Signed)
New Message      Patient would like you to order his yearly check up testing so that he can get it done asap , scheduling appt with PA so that he can get meds

## 2017-08-01 NOTE — Telephone Encounter (Signed)
See mychart message. He plans to call cardiology today for appointment.

## 2017-08-01 NOTE — Telephone Encounter (Signed)
I spoke with pt and told him no testing was planned prior to appointment with Melina Copa, PA on 08/20/17. Lipid profile last checked in primary care in July 2018. Pt reports he usually has a stress test every year and is asking if this should be done prior to appointment.  I told pt it would be determined at 08/20/17 appointment if stress test was needed and if so what kind of stress test. Pt has no other questions or concerns at this time.

## 2017-08-01 NOTE — Addendum Note (Signed)
Addended by: [name] R on: 08/01/2017 12:30 PM   Modules accepted: Orders

## 2017-08-02 DIAGNOSIS — M109 Gout, unspecified: Secondary | ICD-10-CM | POA: Insufficient documentation

## 2017-08-02 DIAGNOSIS — M1990 Unspecified osteoarthritis, unspecified site: Secondary | ICD-10-CM | POA: Insufficient documentation

## 2017-08-02 DIAGNOSIS — Z9289 Personal history of other medical treatment: Secondary | ICD-10-CM | POA: Insufficient documentation

## 2017-08-02 DIAGNOSIS — I4891 Unspecified atrial fibrillation: Secondary | ICD-10-CM | POA: Insufficient documentation

## 2017-08-02 DIAGNOSIS — I2510 Atherosclerotic heart disease of native coronary artery without angina pectoris: Secondary | ICD-10-CM | POA: Insufficient documentation

## 2017-08-15 ENCOUNTER — Ambulatory Visit: Payer: BC Managed Care – PPO | Admitting: Family Medicine

## 2017-08-15 ENCOUNTER — Other Ambulatory Visit: Payer: Self-pay

## 2017-08-15 ENCOUNTER — Encounter: Payer: Self-pay | Admitting: Family Medicine

## 2017-08-15 VITALS — BP 122/64 | HR 59 | Temp 98.0°F | Resp 16 | Ht 67.5 in | Wt 184.8 lb

## 2017-08-15 DIAGNOSIS — E785 Hyperlipidemia, unspecified: Secondary | ICD-10-CM | POA: Diagnosis not present

## 2017-08-15 DIAGNOSIS — R7989 Other specified abnormal findings of blood chemistry: Secondary | ICD-10-CM

## 2017-08-15 DIAGNOSIS — Z79890 Hormone replacement therapy (postmenopausal): Secondary | ICD-10-CM | POA: Diagnosis not present

## 2017-08-15 DIAGNOSIS — Z5181 Encounter for therapeutic drug level monitoring: Secondary | ICD-10-CM | POA: Diagnosis not present

## 2017-08-15 DIAGNOSIS — R410 Disorientation, unspecified: Secondary | ICD-10-CM

## 2017-08-15 DIAGNOSIS — E291 Testicular hypofunction: Secondary | ICD-10-CM

## 2017-08-15 DIAGNOSIS — F418 Other specified anxiety disorders: Secondary | ICD-10-CM

## 2017-08-15 MED ORDER — TESTOSTERONE 30 MG/ACT TD SOLN: 30.0000 mg | TRANSDERMAL | 2 refills | Status: DC

## 2017-08-15 MED ORDER — ALPRAZOLAM 0.5 MG PO TABS: 0.5000 mg | ORAL_TABLET | Freq: Every evening | ORAL | 0 refills | Status: DC | PRN

## 2017-08-15 NOTE — Progress Notes (Signed)
Subjective:  By signing my name below, I, Moises Blood, attest that this documentation has been prepared under the direction and in the presence of [name], MD. Electronically Signed: Moises Blood, [place]. 08/15/2017 , 10:15 AM .  Patient was seen in Room 10 .   Patient ID: Jeremy Hendricks, male    DOB: 1956-04-07, 62 y.o.   MRN: [id number] Chief Complaint  Patient presents with  . Follow-up    patient presents for 6 month follow up after CPE.   HPI Jeremy Hendricks is a 62 y.o. male Here for follow up.   [10:39AM] At the end of the visit, he requests prescription of valium for relaxation purposes for upcoming dental work.   History of migraines He mentions having disorientation spells that used to occur about once a month for years; last episode was a few days ago. He has a "warning" prior to episode occurring with nausea and slight disorientation that lasts for about a minute with slight fatigue and feeling down. These symptoms will repeat hourly for the entire duration of his spell. He mentions food smells/tastes make it worse. He had similar symptoms when his migraines used to "wash over him". He denies any abdominal pain or vomiting.   Hypogonadism Lab Results  Component Value Date   WBC 11.9 (A) 03/04/2017   HGB 15.3 03/04/2017   HCT 44.3 03/04/2017   MCV 87.4 03/04/2017   PLT 259 02/11/2017   Lab Results  Component Value Date   PSA 0.9 03/29/2016   Lab Results  Component Value Date   CHOL 112 02/11/2017   HDL 37 (L) 02/11/2017   LDLCALC 56 02/11/2017   TRIG 93 02/11/2017   CHOLHDL 3.0 02/11/2017   Lab Results  Component Value Date   ALT 15 02/11/2017   AST 20 02/11/2017   ALKPHOS 63 02/11/2017   BILITOT 0.7 02/11/2017   Testosterone: 1095 in July 2018. Recommended repeating testing in 6 weeks if dosing changes needed. However, he has not had repeating testing. Currently taking transdermal testosterone 60mg  QD.   He's been down to testosterone solution  30mg  QD since his last visit. He's been able to obtain erections. He denies any new fatigue or new complications.   Hyperlipidemia He is followed by cardiology. He takes Crestor 40mg  QD.   Lab Results  Component Value Date   CHOL 112 02/11/2017   HDL 37 (L) 02/11/2017   LDLCALC 56 02/11/2017   TRIG 93 02/11/2017   CHOLHDL 3.0 02/11/2017   Lab Results  Component Value Date   ALT 15 02/11/2017   AST 20 02/11/2017   ALKPHOS 63 02/11/2017   BILITOT 0.7 02/11/2017    HTN He takes Atenolol 25mg  QD and Lasix 40mg  QD. He's been taking 1.5 tablets of Lasix each day. He mentions having occasional shortness of breath if he forgets to take his Lasix. He also notes harder to breathe when bending over to tie his shoes.   CAD He has history of CAD, followed by Dr. Angelena Form. He has an appointment with Melina Copa, PA on Jan 8th. Last stress test in June 2016, 15 METs. Last echo in Nov 2014, EF 60-65%.   Patient Active Problem List   Diagnosis Date Noted  . History of ETT   . Gout   . DJD (degenerative joint disease)   . Coronary artery disease   . Atrial fibrillation ([place])   . 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 HLD (hyperlipidemia) 06/11/2016  . Hypogonadism in male 09/09/2015  .  INSOMNIA 09/19/2010  . SHORTNESS OF BREATH 09/19/2010  . CHEST PAIN-PRECORDIAL 08/23/2010  . CAD, ARTERY BYPASS GRAFT 10/26/2009  . HYPERLIPIDEMIA 10/24/2009  . DEGENERATIVE JOINT DISEASE 10/24/2009  . ANGINA, HX OF 10/24/2009   Past Medical History:  Diagnosis Date  . Atrial fibrillation ([place])    postoperative  . Coronary artery disease    s/p CABG 2002, redo 2012  . DJD (degenerative joint disease)   . Gout   . History of ETT    a. ETT 6/16:  normal   Past Surgical History:  Procedure Laterality Date  . CORONARY ARTERY BYPASS GRAFT  2002  . CORONARY ARTERY BYPASS GRAFT  08-2010   L-LAD remained from original CABG; new grafts incl L radial- PDA + RIMA-RI  . VASECTOMY     No Known Allergies Prior to  Admission medications   Medication Sig Start Date End Date Taking? Authorizing Provider  aspirin EC 81 MG tablet Take 81 mg daily by mouth.   Yes [provider]  atenolol (TENORMIN) 25 MG tablet Take 1 tablet (25 mg total) by mouth daily. Call cardiology office to schedule appt 08/01/17  Yes Wendie Agreste, MD  furosemide (LASIX) 40 MG tablet TAKE ONE 40 MG TABLET DAILY ALTERNATING WITH BID THE NEXT DAY. PLEASE MAKE OVERDUE YEARLY APPT 08/01/17  Yes Wendie Agreste, MD  Multiple Vitamin (MULTIVITAMIN) tablet Take 1 tablet by mouth daily.     Yes [provider]  nitroGLYCERIN (NITROSTAT) 0.4 MG SL tablet Place 0.4 mg under the tongue every 5 (five) minutes as needed for chest pain. Reported on 09/21/2015   Yes [provider]  rosuvastatin (CRESTOR) 40 MG tablet Take 1 tablet (40 mg total) by mouth daily. APPOINTMENT NEEDED with cardiology, 08/01/17  Yes Wendie Agreste, MD  Testosterone 30 MG/ACT SOLN Place 60 mg onto the skin every morning. 03/09/17  Yes Wendie Agreste, MD  cyclobenzaprine (FLEXERIL) 5 MG tablet 1 pill by mouth up to every 8 hours as needed. Start with one pill by mouth each bedtime as needed due to sedation Patient not taking: Reported on 08/15/2017 02/11/17   Wendie Agreste, MD  HYDROcodone-acetaminophen (NORCO) 5-325 MG tablet Take 1 tablet by mouth every 6 (six) hours as needed. Patient not taking: Reported on 06/19/2017 03/04/17   Joretta Bachelor, PA   Social History   Socioeconomic History  . Marital status: Single    Spouse name: Not on file  . Number of children: Not on file  . Years of education: Not on file  . Highest education level: Not on file  Social Needs  . Financial resource strain: Not on file  . Food insecurity - worry: Not on file  . Food insecurity - inability: Not on file  . Transportation needs - medical: Not on file  . Transportation needs - non-medical: Not on file  Occupational History  . Occupation:  Realtor  Tobacco Use  . Smoking status: Former Smoker    Last attempt to quit: 08/14/1999    Years since quitting: 18.0  . Smokeless tobacco: Never Used  Substance and Sexual Activity  . Alcohol use: Yes    Alcohol/week: 8.4 oz    Types: 14 Standard drinks or equivalent per week  . Drug use: No  . Sexual activity: Yes  Other Topics Concern  . Not on file  Social History Narrative   Single   Education: College   Exercise: Yes   Review of Systems  Constitutional: Negative  for fatigue and unexpected weight change.  Eyes: Negative for visual disturbance.  Respiratory: Negative for cough, chest tightness and shortness of breath.   Cardiovascular: Negative for chest pain, palpitations and leg swelling.  Gastrointestinal: Positive for nausea. Negative for abdominal pain, blood in stool and vomiting.  Neurological: Positive for dizziness. Negative for light-headedness and headaches.       Objective:   Physical Exam  Constitutional: He is oriented to person, place, and time. He appears well-developed and well-nourished.  HENT:  Head: Normocephalic and atraumatic.  Eyes: EOM are normal. Pupils are equal, round, and reactive to light.  Neck: No JVD present. Carotid bruit is not present.  Cardiovascular: Normal rate, regular rhythm and normal heart sounds.  No murmur heard. Pulmonary/Chest: Effort normal and breath sounds normal. He has no rales.  Musculoskeletal: He exhibits no edema.  Neurological: He is alert and oriented to person, place, and time. He has normal strength. He displays a negative Romberg sign. Coordination and gait normal.  No pronator drift, normal heel-toe gait, normal finger-to-nose  Skin: Skin is warm and dry.  Psychiatric: He has a normal mood and affect.  Vitals reviewed.   Vitals:   08/15/17 0934  BP: 122/64  Pulse: (!) 59  Resp: 16  Temp: 98 F (36.7 C)  TempSrc: Oral  SpO2: 98%  Weight: 184 lb 12.8 oz (83.8 kg)  Height: 5' 7.5" (1.715 m)        Assessment & Plan:   Jeremy Hendricks is a 62 y.o. male Hypogonadism in male - Plan: Testosterone, Free, Total, SHBG, Testosterone 30 MG/ACT SOLN Low testosterone - Plan: Testosterone, Free, Total, SHBG, Testosterone 30 MG/ACT SOLN Encounter for monitoring testosterone replacement therapy - Plan: Testosterone, Free, Total, SHBG, CBC, Comprehensive metabolic panel, Lipid panel, PSA  - Check testosterone on lower dosing. Other screening testing including PSA, LFTs, CBC, lipid panel for monitoring. Continue same dose testosterone at this time  Hyperlipidemia, unspecified hyperlipidemia type - Plan: Comprehensive metabolic panel, Lipid panel  -Continue Crestor as currently tolerating. Follow-up with cardiology as planned  Disorientation - Plan: Ambulatory referral to Neurology  -Long-standing symptoms, but episodic disorientation with nausea, no true headache, but differential would include possible atypical migraine. Will refer to neurology to determine if other testing/imaging needed. RTC/ER precautions if acute worsening  Situational anxiety - Plan: ALPRAZolam (XANAX) 0.5 MG tablet  -Upcoming dental appointment with significant anxiety in the past during dental work. Alprazolam given to try low dose if needed with potential side effects discussed.  Meds ordered this encounter  Medications  . Testosterone 30 MG/ACT SOLN    Sig: Place 30 mg onto the skin every morning.    Dispense:  90 mL    Refill:  2  . ALPRAZolam (XANAX) 0.5 MG tablet    Sig: Take 1-2 tablets (0.5-1 mg total) by mouth at bedtime as needed for anxiety.    Dispense:  10 tablet    Refill:  0   Patient Instructions   I will refer you to neurology to discuss the disorientation symptoms to decide on further workup. Return to the clinic or go to the nearest emergency room if any of your symptoms worsen or new symptoms occur.  No change in med doses for now. Keep follow up with cardiology as planned. Follow up with me in 6  months for physical and recheck of testosterone.   If you would like to treat nail fungus, please follow up for exam of those areas and to discuss  treatment options.   Alprazolam before dental work if needed. Use lowest effective dose.   Thanks for coming in today.    IF you received an x-ray today, you will receive an invoice from [facility]. Please contact [facility] at [phone] with questions or concerns regarding your invoice.   IF you received labwork today, you will receive an invoice from [facility]. Please contact [facility] at [phone] with questions or concerns regarding your invoice.   Our billing staff will not be able to assist you with questions regarding bills from these companies.  You will be contacted with the lab results as soon as they are available. The fastest way to get your results is to activate your My Chart account. Instructions are located on the last page of this paperwork. If you have not heard from [place] regarding the results in 2 weeks, please contact this office.      I personally performed the services described in this documentation, which was scribed in my presence. The recorded information has been reviewed and considered for accuracy and completeness, addended by me as needed, and agree with information above.  Signed,   [name], MD [facility].  08/16/17 3:00 PM

## 2017-08-15 NOTE — Patient Instructions (Addendum)
I will refer you to neurology to discuss the disorientation symptoms to decide on further workup. Return to the clinic or go to the nearest emergency room if any of your symptoms worsen or new symptoms occur.  No change in med doses for now. Keep follow up with cardiology as planned. Follow up with me in 6 months for physical and recheck of testosterone.   If you would like to treat nail fungus, please follow up for exam of those areas and to discuss treatment options.   Alprazolam before dental work if needed. Use lowest effective dose.   Thanks for coming in today.    IF you received an x-ray today, you will receive an invoice from [facility]. Please contact [facility] at [phone] with questions or concerns regarding your invoice.   IF you received labwork today, you will receive an invoice from [facility]. Please contact [facility] at [phone] with questions or concerns regarding your invoice.   Our billing staff will not be able to assist you with questions regarding bills from these companies.  You will be contacted with the lab results as soon as they are available. The fastest way to get your results is to activate your My Chart account. Instructions are located on the last page of this paperwork. If you have not heard from [place] regarding the results in 2 weeks, please contact this office.

## 2017-08-16 ENCOUNTER — Encounter: Payer: Self-pay | Admitting: Family Medicine

## 2017-08-16 LAB — LIPID PANEL
CHOLESTEROL TOTAL: 91 mg/dL — AB (ref 100–199)
Chol/HDL Ratio: 3 ratio (ref 0.0–5.0)
HDL: 30 mg/dL — ABNORMAL LOW (ref >39)
LDL Calculated: 42 mg/dL (ref 0–99)
Triglycerides: 97 mg/dL (ref 0–149)
VLDL Cholesterol Cal: 19 mg/dL (ref 5–40)

## 2017-08-16 LAB — TESTOSTERONE, FREE, TOTAL, SHBG
Sex Hormone Binding: 34.2 nmol/L (ref 19.3–76.4)
TESTOSTERONE FREE: 19 pg/mL — AB (ref 6.6–18.1)
TESTOSTERONE: 818 ng/dL (ref 264–916)

## 2017-08-16 LAB — COMPREHENSIVE METABOLIC PANEL
A/G RATIO: 2 (ref 1.2–2.2)
ALBUMIN: 5 g/dL — AB (ref 3.6–4.8)
ALK PHOS: 64 IU/L (ref 39–117)
ALT: 25 IU/L (ref 0–44)
AST: 24 IU/L (ref 0–40)
BILIRUBIN TOTAL: 0.4 mg/dL (ref 0.0–1.2)
BUN / CREAT RATIO: 16 (ref 10–24)
BUN: 19 mg/dL (ref 8–27)
CHLORIDE: 101 mmol/L (ref 96–106)
CO2: 23 mmol/L (ref 20–29)
CREATININE: 1.21 mg/dL (ref 0.76–1.27)
Calcium: 9.7 mg/dL (ref 8.6–10.2)
GFR calc Af Amer: 75 mL/min/{1.73_m2} (ref >59)
GFR calc non Af Amer: 65 mL/min/{1.73_m2} (ref >59)
GLOBULIN, TOTAL: 2.5 g/dL (ref 1.5–4.5)
Glucose: 114 mg/dL — ABNORMAL HIGH (ref 65–99)
POTASSIUM: 4.7 mmol/L (ref 3.5–5.2)
SODIUM: 144 mmol/L (ref 134–144)
Total Protein: 7.5 g/dL (ref 6.0–8.5)

## 2017-08-16 LAB — CBC
HEMATOCRIT: 44.7 % (ref 37.5–51.0)
HEMOGLOBIN: 15.1 g/dL (ref 13.0–17.7)
MCH: 30.8 pg (ref 26.6–33.0)
MCHC: 33.8 g/dL (ref 31.5–35.7)
MCV: 91 fL (ref 79–97)
Platelets: 241 10*3/uL (ref 150–379)
RBC: 4.91 x10E6/uL (ref 4.14–5.80)
RDW: 13.4 % (ref 12.3–15.4)
WBC: 10 10*3/uL (ref 3.4–10.8)

## 2017-08-16 LAB — PSA: PROSTATE SPECIFIC AG, SERUM: 1.6 ng/mL (ref 0.0–4.0)

## 2017-08-19 ENCOUNTER — Encounter: Payer: Self-pay | Admitting: Physician Assistant

## 2017-08-19 NOTE — Progress Notes (Signed)
Cardiology Office Note    Date:  08/20/2017  ID:  Jeremy Hendricks, DOB May 01, 1956, MRN [id number] PCP:  Wendie Agreste, MD  Cardiologist:  Dr. Angelena Form   Chief Complaint: overdue f/u CAD (last seen 2017)  History of Present Illness:  Jeremy Hendricks is a 62 y.o. male with history of CAD s/p CABG in 2002 and redo in January 2012, post-op afib, chronic diastolic CHF (per Dr. Camillia Herter note), DDD, hypogonadism who is here today for cardiac follow up.  He saw Dr. Verl Blalock in January 2012 with chest discomfort and was noted to have an abnormal Myoview. Cath had demonstrated severe graft disease and native three-vessel CAD. He was referred for redo bypass. His LIMA to LAD remained patent. His grafts at his surgery on 09/01/10 with Dr. Roxan Hockey included a left radial-PDA and a free RIMA-ramus intermedius. Postoperatively he developed atrial fibrillation with rapid ventricular rate and was placed on amiodarone. He has maintained sinus since then and his amiodarone was stopped. Echo 06/15/13 showed mild LVH, EF 60-65%, no valve disease. ETT 01/2015 was normal. Last labs 08/2017 showed LDL 42, K 4.7, Cr 1.21, normal AST/ALT, normal CBC.  Overall he feels like he is doing well from cardiac standpoint. He works out regularly and denies any chest pain/angina or dyspnea with exertion. He does notice occasional bendopnea (dyspnea when bending over). He says he can tell when he misses his Lasix due to the dyspnea that follows. No orthopnea. + Occasional LEE especially when on his feet for a while. He does notice some abdominal cramping when doing abdominal exercising but no post-prandial discomfort or leg claudication. He is now taking Lasix 40mg  daily with extra 1/2 tablet QPM as needed for dyspnea. He does salt his food regularly.    Past Medical History:  Diagnosis Date  . Atrial fibrillation ([place])    postoperative  . Chronic diastolic CHF (congestive heart failure) ([place])   . Coronary artery disease    a.  s/p CABG 2002. b. s/p redo 2012.  Marland Kitchen DJD (degenerative joint disease)   . Gout   . History of ETT    a. ETT 6/16:  normal    Past Surgical History:  Procedure Laterality Date  . CORONARY ARTERY BYPASS GRAFT  2002  . CORONARY ARTERY BYPASS GRAFT  08-2010   L-LAD remained from original CABG; new grafts incl L radial- PDA + RIMA-RI  . VASECTOMY      Current Medications: Current Meds  Medication Sig  . ALPRAZolam (XANAX) 0.5 MG tablet Take 1-2 tablets (0.5-1 mg total) by mouth at bedtime as needed for anxiety.  Marland Kitchen aspirin EC 81 MG tablet Take 81 mg daily by mouth.  Marland Kitchen atenolol (TENORMIN) 25 MG tablet Take 1 tablet (25 mg total) by mouth daily. Call cardiology office to schedule appt  . furosemide (LASIX) 40 MG tablet TAKE ONE 40 MG TABLET DAILY ALTERNATING WITH BID THE NEXT DAY. PLEASE MAKE OVERDUE YEARLY APPT  . Multiple Vitamin (MULTIVITAMIN) tablet Take 1 tablet by mouth daily.    . nitroGLYCERIN (NITROSTAT) 0.4 MG SL tablet Place 0.4 mg under the tongue every 5 (five) minutes as needed for chest pain. Reported on 09/21/2015  . rosuvastatin (CRESTOR) 40 MG tablet Take 1 tablet (40 mg total) by mouth daily. APPOINTMENT NEEDED with cardiology,  . Testosterone 30 MG/ACT SOLN Place 30 mg onto the skin every morning.     Allergies:   Patient has no known allergies.   Social History   Socioeconomic  History  . Marital status: Single    Spouse name: None  . Number of children: None  . Years of education: None  . Highest education level: None  Social Needs  . Financial resource strain: None  . Food insecurity - worry: None  . Food insecurity - inability: None  . Transportation needs - medical: None  . Transportation needs - non-medical: None  Occupational History  . Occupation: Realtor  Tobacco Use  . Smoking status: Former Smoker    Last attempt to quit: 08/14/1999    Years since quitting: 18.0  . Smokeless tobacco: Never Used  Substance and Sexual Activity  . Alcohol use: Yes     Alcohol/week: 8.4 oz    Types: 14 Standard drinks or equivalent per week  . Drug use: No  . Sexual activity: Yes  Other Topics Concern  . None  Social History Narrative   Single   Education: College   Exercise: Yes     Family History:  Family History  Problem Relation Age of Onset  . Heart attack Father 36  . Hypertension Father   . Cancer Mother        Breast cancer  . Cancer Unknown        family hx of  . Coronary artery disease Unknown        family hx of  . Hyperlipidemia Unknown        family hx of  . Hypertension Paternal Grandfather   . Stroke Neg Hx   . Colon cancer Neg Hx      ROS:   Please see the history of present illness. All other systems are reviewed and otherwise negative.    PHYSICAL EXAM:   VS:  BP 120/72   Pulse 60   Resp 16   Ht 5' 7.5" (1.715 m)   Wt 187 lb 12.8 oz (85.2 kg)   SpO2 99%   BMI 28.98 kg/m   BMI: Body mass index is 28.98 kg/m. GEN: Well nourished, well developed fit appearing WM, in no acute distress  HEENT: normocephalic, atraumatic Neck: no JVD, carotid bruits, or masses Cardiac: RRR; no murmurs, rubs, or gallops, no edema, 1+ pedal pulses bilaterally Respiratory: diminished BS at R base with faint crackles, otherwise clear to auscultation bilaterally, normal work of breathing GI: soft, nontender, nondistended, + BS MS: no deformity or atrophy  Skin: warm and dry, no rash Neuro:  Alert and Oriented x 3, Strength and sensation are intact, follows commands Psych: euthymic mood, full affect  Wt Readings from Last 3 Encounters:  08/20/17 187 lb 12.8 oz (85.2 kg)  08/15/17 184 lb 12.8 oz (83.8 kg)  07/01/17 186 lb (84.4 kg)      Studies/Labs Reviewed:   EKG:  EKG was ordered today and personally reviewed by me and demonstrates NSR 59bpm, left posterior fascicular block, nonspecific ST-T changes similar to prior tracing in 2017. QTc 448ms.  Recent Labs: 08/15/2017: ALT 25; BUN 19; Creatinine, Ser 1.21; Hemoglobin 15.1;  Platelets 241; Potassium 4.7; Sodium 144   Lipid Panel    Component Value Date/Time   CHOL 91 (L) 08/15/2017 1030   TRIG 97 08/15/2017 1030   HDL 30 (L) 08/15/2017 1030   CHOLHDL 3.0 08/15/2017 1030   CHOLHDL 3.7 02/03/2016 0839   VLDL 21 02/03/2016 0839   LDLCALC 42 08/15/2017 1030    Additional studies/ records that were reviewed today include: Summarized above   ASSESSMENT & PLAN:   1. CAD - overall doing well  from a cardiac standpoint. No symptoms to suggest recurrent angina. He does not have any exertional limitation whatsoever and has been working to focus more on cardio at the gym. He will continue aspirin, beta blocker and statin for now. Recent LDL well controlled. 2. Post-op atrial fib - quiescent. Continue beta blocker and surveillance. 3. Chronic diastolic CHF with manifestation of bendopnea - exam suspicious for small pleural effusion. He does report salting food regularly and drinks 1 gallon of fluid daily (=128oz). We discussed 2g sodium and 2L fluid restriction. I do want him to stay hydrated for workouts given history of cramping but not to excess. Will check BNP and 2V CXR and decide further on Lasix adjustment based on results. 4. Muscle cramping - somewhat atypical, mostly during abdominal exercises. No post-prandial discomfort or classic claudication to suggest PAD. He traditionally thought this was related to sodium and potassium but these lytes were normal recently. Will check TSH, Mg, and CK to further assess.  Disposition: F/u with me in 4 weeks.   Medication Adjustments/Labs and Tests Ordered: Current medicines are reviewed at length with the patient today.  Concerns regarding medicines are outlined above. Medication changes, Labs and Tests ordered today are summarized above and listed in the Patient Instructions accessible in Encounters.   Signed, Charlie Pitter, PA-C  08/20/2017 8:50 AM    [address], [place], [place]   [address] Phone: [phone]; Fax: [phone]

## 2017-08-20 ENCOUNTER — Encounter: Payer: Self-pay | Admitting: Physician Assistant

## 2017-08-20 ENCOUNTER — Ambulatory Visit: Payer: BC Managed Care – PPO | Admitting: Physician Assistant

## 2017-08-20 ENCOUNTER — Telehealth: Payer: Self-pay | Admitting: *Deleted

## 2017-08-20 ENCOUNTER — Ambulatory Visit
Admission: RE | Admit: 2017-08-20 | Discharge: 2017-08-20 | Disposition: A | Discharge status: Home / Self Care | Payer: BC Managed Care – PPO | Source: Ambulatory Visit | Attending: Physician Assistant | Admitting: Physician Assistant

## 2017-08-20 VITALS — BP 120/72 | HR 60 | Resp 16 | Ht 67.5 in | Wt 187.8 lb

## 2017-08-20 DIAGNOSIS — I5032 Chronic diastolic (congestive) heart failure: Secondary | ICD-10-CM | POA: Diagnosis not present

## 2017-08-20 DIAGNOSIS — R252 Cramp and spasm: Secondary | ICD-10-CM | POA: Diagnosis not present

## 2017-08-20 DIAGNOSIS — R0600 Dyspnea, unspecified: Secondary | ICD-10-CM

## 2017-08-20 DIAGNOSIS — Z136 Encounter for screening for cardiovascular disorders: Secondary | ICD-10-CM | POA: Diagnosis not present

## 2017-08-20 DIAGNOSIS — I2510 Atherosclerotic heart disease of native coronary artery without angina pectoris: Secondary | ICD-10-CM

## 2017-08-20 DIAGNOSIS — J90 Pleural effusion, not elsewhere classified: Secondary | ICD-10-CM

## 2017-08-20 DIAGNOSIS — Z13220 Encounter for screening for lipoid disorders: Secondary | ICD-10-CM | POA: Diagnosis not present

## 2017-08-20 DIAGNOSIS — I480 Paroxysmal atrial fibrillation: Secondary | ICD-10-CM | POA: Diagnosis not present

## 2017-08-20 LAB — MAGNESIUM: Magnesium: 2.2 mg/dL (ref 1.6–2.3)

## 2017-08-20 LAB — PRO B NATRIURETIC PEPTIDE: NT-PRO BNP: 169 pg/mL (ref 0–210)

## 2017-08-20 LAB — TSH: TSH: 2.91 u[IU]/mL (ref 0.450–4.500)

## 2017-08-20 LAB — CK: CK TOTAL: 72 U/L (ref 24–204)

## 2017-08-20 MED ORDER — ATENOLOL 25 MG PO TABS: 25.0000 mg | ORAL_TABLET | Freq: Every day | ORAL | 3 refills | Status: DC

## 2017-08-20 MED ORDER — ROSUVASTATIN CALCIUM 40 MG PO TABS: 40.0000 mg | ORAL_TABLET | Freq: Every day | ORAL | 3 refills | Status: DC

## 2017-08-20 MED ORDER — FUROSEMIDE 40 MG PO TABS: ORAL_TABLET | ORAL | 3 refills | Status: DC

## 2017-08-20 NOTE — Telephone Encounter (Signed)
-----   Message from Charlie Pitter, [place] sent at 08/20/2017  1:21 PM EST ----- Please let patient know there are chronic appearing changes in the right lung, possibly representing a pocket of fluid accumulation. Radiologist feels this has likely been there in the past as well. Given that it is "loculated" (aka a pocket) it may not respond to oral diuretics/Lasix. The radiologist recommends a CT of the chest to further characterize the fluid pocket. I called CT department to clarify and they recommend a CT of the chest with contrast. Please arrange. Will also await labs before advising further on med adjustment. Dayna Dunn PA-C

## 2017-08-20 NOTE — Patient Instructions (Addendum)
Medication Instructions:  Your physician recommends that you continue on your current medications as directed. Please refer to the Current Medication list given to you today.   Labwork: TODAY:  MAGNESIUM, TSH, PRO BNP, & CK LEVEL  Testing/Procedures: A chest x-ray takes a picture of the organs and structures inside the chest, including the heart, lungs, and blood vessels. This test can show several things, including, whether the heart is enlarges; whether fluid is building up in the lungs; and whether pacemaker / defibrillator leads are still in place.  [facility] [address]  [place], [place] [address]  Follow-Up: Your physician wants you to follow-up in:  [place], PA-C  Any Other Special Instructions Will Be Listed Below (If Applicable).  X-rays X-rays are tests that create pictures of the inside of your body using radiation. Different body parts absorb different amounts of radiation, which show up on the X-ray pictures in shades of black, gray, and white. X-rays are used to look for many health conditions, including broken bones, lung problems, and some types of cancer. Tell a health care provider about:  Any allergies you have.  All medicines you are taking, including vitamins, herbs, eye drops, creams, and over-the-counter medicines.  Previous surgeries you have had.  Medical conditions you have. What are the risks? Getting an X-ray is a safe procedure. What happens before the procedure?  Tell the X-ray technician if you are pregnant or might be pregnant.  You may be asked to wear a protective lead apron to hide parts of your body from the X-ray.  You usually will need to undress whatever part of your body needs the X-ray. You will be given a hospital gown to wear, if needed.  You may need to remove your glasses, jewelry, and other metal objects. What happens during the procedure?  The X-ray machine  creates a picture by using a tiny burst of radiation. It is painless.  You may need to have several pictures taken at different angles.  You will need to try to be as still as you can during the examination to get the best possible images. What happens after the procedure?  You will be able to resume your normal activities.  The X-ray will be examined by your health care provider or a radiology specialist.  It is your responsibility to get your test results. Ask your health care provider when to expect your results and how to get them. This information is not intended to replace advice given to you by your health care provider. Make sure you discuss any questions you have with your health care provider. Document Released: 07/30/2005 Document Revised: 03/29/2016 Document Reviewed: 09/23/2013 Elsevier Interactive Patient Education  [employer].    If you need a refill on your cardiac medications before your next appointment, please call your pharmacy.

## 2017-08-25 ENCOUNTER — Other Ambulatory Visit: Payer: Self-pay | Admitting: Family Medicine

## 2017-08-25 ENCOUNTER — Encounter: Payer: Self-pay | Admitting: Physician Assistant

## 2017-08-25 DIAGNOSIS — R739 Hyperglycemia, unspecified: Secondary | ICD-10-CM

## 2017-08-25 DIAGNOSIS — E8809 Other disorders of plasma-protein metabolism, not elsewhere classified: Secondary | ICD-10-CM

## 2017-08-25 NOTE — Progress Notes (Signed)
Lab only orders for elevated albumin, glucose on most recent labs.

## 2017-09-03 ENCOUNTER — Ambulatory Visit (INDEPENDENT_AMBULATORY_CARE_PROVIDER_SITE_OTHER)
Admission: RE | Admit: 2017-09-03 | Discharge: 2017-09-03 | Disposition: A | Discharge status: Home / Self Care | Payer: BC Managed Care – PPO | Source: Ambulatory Visit | Attending: Physician Assistant | Admitting: Physician Assistant

## 2017-09-03 DIAGNOSIS — J90 Pleural effusion, not elsewhere classified: Secondary | ICD-10-CM | POA: Diagnosis not present

## 2017-09-03 MED ORDER — IOPAMIDOL (ISOVUE-300) INJECTION 61%
80.0000 mL | Freq: Once | INTRAVENOUS | Status: AC | PRN
  Administered 2017-09-03: 80 mL via INTRAVENOUS

## 2017-09-18 ENCOUNTER — Encounter: Payer: Self-pay | Admitting: Physician Assistant

## 2017-09-19 ENCOUNTER — Other Ambulatory Visit: Payer: Self-pay | Admitting: Physician Assistant

## 2017-09-19 ENCOUNTER — Ambulatory Visit: Payer: BC Managed Care – PPO | Admitting: Physician Assistant

## 2017-09-20 ENCOUNTER — Ambulatory Visit: Payer: BC Managed Care – PPO | Admitting: Family Medicine

## 2017-09-20 ENCOUNTER — Encounter: Payer: Self-pay | Admitting: Family Medicine

## 2017-09-20 ENCOUNTER — Other Ambulatory Visit: Payer: Self-pay

## 2017-09-20 VITALS — BP 136/77 | HR 58 | Temp 97.6°F | Resp 18 | Ht 68.19 in | Wt 187.4 lb

## 2017-09-20 DIAGNOSIS — M25461 Effusion, right knee: Secondary | ICD-10-CM | POA: Diagnosis not present

## 2017-09-20 DIAGNOSIS — M25561 Pain in right knee: Secondary | ICD-10-CM | POA: Diagnosis not present

## 2017-09-20 DIAGNOSIS — Z8739 Personal history of other diseases of the musculoskeletal system and connective tissue: Secondary | ICD-10-CM | POA: Diagnosis not present

## 2017-09-20 MED ORDER — PREDNISONE 20 MG PO TABS: ORAL_TABLET | ORAL | 0 refills | Status: AC

## 2017-09-20 MED ORDER — COLCHICINE 0.6 MG PO TABS: 0.6000 mg | ORAL_TABLET | Freq: Every day | ORAL | 1 refills | Status: DC

## 2017-09-20 MED ORDER — HYDROCODONE-ACETAMINOPHEN 5-325 MG PO TABS: 1.0000 | ORAL_TABLET | Freq: Four times a day (QID) | ORAL | 0 refills | Status: DC | PRN

## 2017-09-20 NOTE — Progress Notes (Signed)
2/8/20195:41 PM  Jeremy Hendricks 03/06/1956, 62 y.o. male [id number]  Chief Complaint  Patient presents with  . Knee Pain    X 5 days - pt states gout- right knee    HPI:   Patient is a 62 y.o. male with past medical history significant for CAD and gout who presents today for about 5 days of swollen, painful, hot right knee. Not getting better despite ibuprofen 800mg  TID. About a week ago had a mild attack on right big toe. Prior to that had an attack in July 2018, also to the right knee. Thinks that this attack might be due to recent recurring drinking of whiskey, normally drinks beer or wine. Denies any other dietary changes. Denies any trauma or injuries.   Xray in July 2018: negative Fluid analysis: no crystals Reports elevated uric acid in the past. Did well with prednisone in the past, has never tried colchicine  Depression screen [facility] 2/9 09/20/2017 08/15/2017 02/11/2017  Decreased Interest 0 0 0  Down, Depressed, Hopeless 0 0 0  PHQ - 2 Score 0 0 0    No Known Allergies  Prior to Admission medications   Medication Sig Start Date End Date Taking? Authorizing Provider  ALPRAZolam Duanne Moron) 0.5 MG tablet Take 1-2 tablets (0.5-1 mg total) by mouth at bedtime as needed for anxiety. 08/15/17  Yes Wendie Agreste, MD  aspirin EC 81 MG tablet Take 81 mg daily by mouth.   Yes [provider]  atenolol (TENORMIN) 25 MG tablet Take 1 tablet (25 mg total) by mouth daily. 08/20/17  Yes Dunn, Dayna N, PA-C  furosemide (LASIX) 40 MG tablet Take 1 tablet by mouth daily and 1/2 tablet by mouth daily as needed for swelling 08/20/17  Yes Dunn, Dayna N, PA-C  Multiple Vitamin (MULTIVITAMIN) tablet Take 1 tablet by mouth daily.     Yes [provider]  nitroGLYCERIN (NITROSTAT) 0.4 MG SL tablet Place 0.4 mg under the tongue every 5 (five) minutes as needed for chest pain. Reported on 09/21/2015   Yes [provider]  rosuvastatin (CRESTOR) 40 MG tablet Take 1 tablet (40 mg total)  by mouth daily. 08/20/17  Yes Dunn, Nedra Hai, PA-C  Testosterone 30 MG/ACT SOLN Place 30 mg onto the skin every morning. 08/15/17  Yes Wendie Agreste, MD    Past Medical History:  Diagnosis Date  . Atrial fibrillation ([place])    postoperative  . Chronic diastolic CHF (congestive heart failure) ([place])   . Coronary artery disease    a. s/p CABG 2002. b. s/p redo 2012.  Marland Kitchen DJD (degenerative joint disease)   . Gout   . History of ETT    a. ETT 6/16:  normal    Past Surgical History:  Procedure Laterality Date  . CORONARY ARTERY BYPASS GRAFT  2002  . CORONARY ARTERY BYPASS GRAFT  08-2010   L-LAD remained from original CABG; new grafts incl L radial- PDA + RIMA-RI  . VASECTOMY      Social History   Tobacco Use  . Smoking status: Former Smoker    Last attempt to quit: 08/14/1999    Years since quitting: 18.1  . Smokeless tobacco: Never Used  Substance Use Topics  . Alcohol use: Yes    Alcohol/week: 8.4 oz    Types: 14 Standard drinks or equivalent per week    Family History  Problem Relation Age of Onset  . Heart attack Father 101  . Hypertension Father   . Cancer  Mother        Breast cancer  . Cancer Unknown        family hx of  . Coronary artery disease Unknown        family hx of  . Hyperlipidemia Unknown        family hx of  . Hypertension Paternal Grandfather   . Stroke Neg Hx   . Colon cancer Neg Hx     Review of Systems  Constitutional: Negative for chills and fever.  Musculoskeletal: Positive for joint pain.     OBJECTIVE:  Blood pressure 136/77, pulse (!) 58, temperature 97.6 F (36.4 C), temperature source Oral, resp. rate 18, height 5' 8.19" (1.732 m), weight 187 lb 6.4 oz (85 kg), SpO2 97 %.  Physical Exam  Gen: AAOx3, NAD Right knee: skin is intact, slightly warm to touch, no erythema, + swelling. No bony tenderness. ROM limited due to swelling.   ASSESSMENT and PLAN  1. Pain and swelling of right knee 2. H/O: gout Treating empirically as gout  despite negative fluid analysis. Discussed trial of colchicine first, r/se/b reviewed. If no response then switch to prednisone. Discussed dietary modifications, consider prophylactic medication.   Other orders - colchicine 0.6 MG tablet; Take 1 tablet (0.6 mg total) by mouth daily. On first day take twice a day, then decrease to once a day until symptoms resolve - predniSONE (DELTASONE) 20 MG tablet; Take 2 tablets (40 mg total) by mouth daily with breakfast for 3 days, THEN 1 tablet (20 mg total) daily with breakfast for 3 days. - HYDROcodone-acetaminophen (NORCO/VICODIN) 5-325 MG tablet; Take 1 tablet by mouth every 6 (six) hours as needed for moderate pain.  Return if symptoms worsen or fail to improve.    Rutherford Guys, MD Primary Care at [facility], [place] [address] Ph.  [phone] Fax [phone]

## 2017-09-20 NOTE — Patient Instructions (Signed)
     IF you received an x-ray today, you will receive an invoice from [facility]. Please contact [facility] at [phone] with questions or concerns regarding your invoice.   IF you received labwork today, you will receive an invoice from [facility]. Please contact [facility] at [phone] with questions or concerns regarding your invoice.   Our billing staff will not be able to assist you with questions regarding bills from these companies.  You will be contacted with the lab results as soon as they are available. The fastest way to get your results is to activate your My Chart account. Instructions are located on the last page of this paperwork. If you have not heard from us regarding the results in 2 weeks, please contact this office.     

## 2017-09-30 ENCOUNTER — Encounter: Payer: Self-pay | Admitting: Family Medicine

## 2017-09-30 ENCOUNTER — Telehealth: Payer: Self-pay | Admitting: Physician Assistant

## 2017-09-30 NOTE — Telephone Encounter (Signed)
Please call pt. His insurance company sent [place] a letter indicating interaction between colchicine and the statin he is on. He was not on colchcine when he saw [place]. The combination can increase risk of muscle breakdown and may need to reduce the statin dose. With his coronary disease this is less ideal so please ask him to contact whoever prescribed the colchicine to discuss this interaction and possible plan for monitoring or alternatives. Margorie Renner PA-C

## 2017-09-30 NOTE — Telephone Encounter (Signed)
Spoke with pt re: Colchicine and Statin. Pt advised me that his PCP let him know in the beginning that it could cause problems, so therefore he never took it and doesn't plan to.  Pt did thank me for the call.

## 2017-10-03 ENCOUNTER — Encounter: Payer: Self-pay | Admitting: Family Medicine

## 2017-10-03 ENCOUNTER — Ambulatory Visit: Payer: BC Managed Care – PPO | Admitting: Family Medicine

## 2017-10-03 ENCOUNTER — Other Ambulatory Visit: Payer: Self-pay

## 2017-10-03 VITALS — BP 133/80 | HR 60 | Temp 97.8°F | Resp 16 | Ht 67.5 in | Wt 184.0 lb

## 2017-10-03 DIAGNOSIS — Z113 Encounter for screening for infections with a predominantly sexual mode of transmission: Secondary | ICD-10-CM

## 2017-10-03 DIAGNOSIS — Z5181 Encounter for therapeutic drug level monitoring: Secondary | ICD-10-CM | POA: Diagnosis not present

## 2017-10-03 MED ORDER — EMTRICITABINE-TENOFOVIR DF 200-300 MG PO TABS: 1.0000 | ORAL_TABLET | Freq: Every day | ORAL | 1 refills | Status: DC

## 2017-10-03 NOTE — Patient Instructions (Addendum)
  Start Truvada once per day, continue STI prevention with condom use as discussed. Follow-up in 3 months for repeat testing. Let me know if you have any questions in the meantime. Enjoy your upcoming vacation.    IF you received an x-ray today, you will receive an invoice from [facility]. Please contact [facility] at [phone] with questions or concerns regarding your invoice.   IF you received labwork today, you will receive an invoice from [facility]. Please contact [facility] at [phone] with questions or concerns regarding your invoice.   Our billing staff will not be able to assist you with questions regarding bills from these companies.  You will be contacted with the lab results as soon as they are available. The fastest way to get your results is to activate your My Chart account. Instructions are located on the last page of this paperwork. If you have not heard from [place] regarding the results in 2 weeks, please contact this office.

## 2017-10-03 NOTE — Progress Notes (Signed)
Subjective:  By signing my name below, I, Theresia Bough, attest that this documentation has been prepared under the direction and in the presence of Carlota Raspberry Ranell Patrick, MD.  Electronically Signed: Theresia Bough, Medical Scribe 10/03/17 at 9:18 AM   Patient was seen in Room 11   Patient ID: Jeremy Hendricks, male    DOB: [birth year], 62 y.o.   MRN: [id number] Chief Complaint  Patient presents with  . Medication Refill    patient would like to discuss starting Truvada    HPI Jeremy Hendricks is a 62 y.o. male who presents to Primary Care at [facility] to discuss pre exposure prophylaxis for HIV prevention. The patient sent a message through MyChart with questions regarding using Truvada or a generic form of Prep and interactions with his other medications. He reports that his last HIV testing was done in July 2018 and it was negative. Patient was last seen here on 08/15/17 and his last renal function was normal.  Lab Results  Component Value Date   CREATININE 1.21 08/15/2017   Pt reports that he has had new partners (male and male) since his last HIV test with unprotected intercourse. He states that he is aware of what symptoms acute HIV entails, but denies symptoms currently. He reports no illicit drug use. Pt denies dysuria, fever, testicular pain, rash or any other complaints at this time.   Personally, he will be traveling to [place] on 10/14/17 for 8 days for vacation.   Patient Active Problem List   Diagnosis Date Noted  . History of ETT   . Gout   . DJD (degenerative joint disease)   . Coronary artery disease   . Atrial fibrillation ([place])   . 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 HLD (hyperlipidemia) 06/11/2016  . Hypogonadism in male 09/09/2015  . INSOMNIA 09/19/2010  . SHORTNESS OF BREATH 09/19/2010  . CHEST PAIN-PRECORDIAL 08/23/2010  . CAD, ARTERY BYPASS GRAFT 10/26/2009  . HYPERLIPIDEMIA 10/24/2009  . DEGENERATIVE JOINT DISEASE 10/24/2009  . ANGINA, HX OF 10/24/2009   Past Medical  History:  Diagnosis Date  . Atrial fibrillation ([place])    postoperative  . Chronic diastolic CHF (congestive heart failure) ([place])   . Coronary artery disease    a. s/p CABG 2002. b. s/p redo 2012.  Marland Kitchen DJD (degenerative joint disease)   . Gout   . History of ETT    a. ETT 6/16:  normal   Past Surgical History:  Procedure Laterality Date  . CORONARY ARTERY BYPASS GRAFT  2002  . CORONARY ARTERY BYPASS GRAFT  08-2010   L-LAD remained from original CABG; new grafts incl L radial- PDA + RIMA-RI  . VASECTOMY     No Known Allergies Prior to Admission medications   Medication Sig Start Date End Date Taking? Authorizing Provider  ALPRAZolam Duanne Moron) 0.5 MG tablet Take 1-2 tablets (0.5-1 mg total) by mouth at bedtime as needed for anxiety. 08/15/17   Wendie Agreste, MD  aspirin EC 81 MG tablet Take 81 mg daily by mouth.    [provider]  atenolol (TENORMIN) 25 MG tablet Take 1 tablet (25 mg total) by mouth daily. 08/20/17   Dunn, Nedra Hai, PA-C  colchicine 0.6 MG tablet Take 1 tablet (0.6 mg total) by mouth daily. On first day take twice a day, then decrease to once a day until symptoms resolve 09/20/17   Rutherford Guys, MD  furosemide (LASIX) 40 MG tablet Take 1 tablet by mouth daily and 1/2 tablet by  mouth daily as needed for swelling 08/20/17   Dunn, Nedra Hai, PA-C  HYDROcodone-acetaminophen (NORCO/VICODIN) 5-325 MG tablet Take 1 tablet by mouth every 6 (six) hours as needed for moderate pain. 09/20/17   Rutherford Guys, MD  Multiple Vitamin (MULTIVITAMIN) tablet Take 1 tablet by mouth daily.      [provider]  nitroGLYCERIN (NITROSTAT) 0.4 MG SL tablet Place 0.4 mg under the tongue every 5 (five) minutes as needed for chest pain. Reported on 09/21/2015    [provider]  rosuvastatin (CRESTOR) 40 MG tablet Take 1 tablet (40 mg total) by mouth daily. 08/20/17   Dunn, Nedra Hai, PA-C  Testosterone 30 MG/ACT SOLN Place 30 mg onto the skin every morning. 08/15/17   Wendie Agreste, MD   Social History   Socioeconomic History  . Marital status: Single    Spouse name: Not on file  . Number of children: 2  . Years of education: Not on file  . Highest education level: Not on file  Social Needs  . Financial resource strain: Not on file  . Food insecurity - worry: Not on file  . Food insecurity - inability: Not on file  . Transportation needs - medical: Not on file  . Transportation needs - non-medical: Not on file  Occupational History  . Occupation: Realtor  Tobacco Use  . Smoking status: Former Smoker    Last attempt to quit: 08/14/1999    Years since quitting: 18.1  . Smokeless tobacco: Never Used  Substance and Sexual Activity  . Alcohol use: Yes    Alcohol/week: 8.4 oz    Types: 14 Standard drinks or equivalent per week  . Drug use: No  . Sexual activity: Yes  Other Topics Concern  . Not on file  Social History Narrative   Single   Education: College   Exercise: Yes      Review of Systems  Constitutional: Negative for fever.  Genitourinary: Negative for dysuria, genital sores, scrotal swelling and testicular pain.  Skin: Negative for rash.       Objective:   Physical Exam  Constitutional: He is oriented to person, place, and time. He appears well-developed and well-nourished. No distress.  HENT:  Head: Normocephalic and atraumatic.  Eyes: Conjunctivae are normal. Pupils are equal, round, and reactive to light. No scleral icterus.  Neck: Neck supple.  Cardiovascular: Normal rate.  Pulmonary/Chest: Effort normal. No respiratory distress.  Abdominal: He exhibits no distension.  Neurological: He is alert and oriented to person, place, and time.  Skin: He is not diaphoretic.     Vitals:   10/03/17 0852  BP: 133/80  Pulse: 60  Resp: 16  Temp: 97.8 F (36.6 C)  TempSrc: Oral  SpO2: 98%  Weight: 184 lb (83.5 kg)  Height: 5' 7.5" (1.715 m)        Assessment & Plan:   Jeremy Hendricks is a 62 y.o. male Routine screening for  STI (sexually transmitted infection) - Plan: HIV antibody, RPR, GC/Chlamydia Probe Amp, Basic metabolic panel, emtricitabine-tenofovir (TRUVADA) 200-300 MG tablet  - Preexposure prophylaxis discussion. Plans to start Truvada. Baseline STI screening performed as well as renal function. Importance of continued condom use for prevention of other STI's discussed, potential risks of drug-resistant infection discussed and low but possible chance of infection on preexposure prophylaxis. Recheck in 3 months for repeat screening, renal function testing.  Meds ordered this encounter  Medications  . emtricitabine-tenofovir (TRUVADA) 200-300 MG tablet    Sig:  Take 1 tablet by mouth daily.    Dispense:  90 tablet    Refill:  1   Patient Instructions    Start Truvada once per day, continue STI prevention with condom use as discussed. Follow-up in 3 months for repeat testing. Let me know if you have any questions in the meantime. Enjoy your upcoming vacation.    IF you received an x-ray today, you will receive an invoice from [facility]. Please contact [facility] at [phone] with questions or concerns regarding your invoice.   IF you received labwork today, you will receive an invoice from [facility]. Please contact [facility] at [phone] with questions or concerns regarding your invoice.   Our billing staff will not be able to assist you with questions regarding bills from these companies.  You will be contacted with the lab results as soon as they are available. The fastest way to get your results is to activate your My Chart account. Instructions are located on the last page of this paperwork. If you have not heard from [place] regarding the results in 2 weeks, please contact this office.      I personally performed the services described in this documentation, which was scribed in my presence. The recorded information has been reviewed and considered for accuracy and completeness,  addended by me as needed, and agree with information above.  Signed,   [name], MD Primary Care at [facility].  10/03/17 9:27 AM

## 2017-10-04 LAB — RPR: RPR Ser Ql: NONREACTIVE

## 2017-10-04 LAB — GC/CHLAMYDIA PROBE AMP
Chlamydia trachomatis, NAA: NEGATIVE
Neisseria gonorrhoeae by PCR: NEGATIVE

## 2017-10-04 LAB — BASIC METABOLIC PANEL
BUN/Creatinine Ratio: 16 (ref 10–24)
BUN: 19 mg/dL (ref 8–27)
CHLORIDE: 101 mmol/L (ref 96–106)
CO2: 24 mmol/L (ref 20–29)
Calcium: 9.5 mg/dL (ref 8.6–10.2)
Creatinine, Ser: 1.19 mg/dL (ref 0.76–1.27)
GFR calc Af Amer: 76 mL/min/{1.73_m2} (ref >59)
GFR calc non Af Amer: 66 mL/min/{1.73_m2} (ref >59)
GLUCOSE: 94 mg/dL (ref 65–99)
Potassium: 4.7 mmol/L (ref 3.5–5.2)
SODIUM: 141 mmol/L (ref 134–144)

## 2017-10-04 LAB — HIV ANTIBODY (ROUTINE TESTING W REFLEX): HIV SCREEN 4TH GENERATION: NONREACTIVE

## 2017-10-10 ENCOUNTER — Ambulatory Visit: Payer: BC Managed Care – PPO

## 2017-10-10 ENCOUNTER — Encounter: Payer: Self-pay | Admitting: Family Medicine

## 2017-10-10 ENCOUNTER — Ambulatory Visit (INDEPENDENT_AMBULATORY_CARE_PROVIDER_SITE_OTHER): Payer: BC Managed Care – PPO | Admitting: Family Medicine

## 2017-10-10 ENCOUNTER — Other Ambulatory Visit: Payer: Self-pay

## 2017-10-10 VITALS — BP 120/84 | HR 64 | Temp 97.9°F | Ht 67.0 in | Wt 185.0 lb

## 2017-10-10 DIAGNOSIS — M25561 Pain in right knee: Secondary | ICD-10-CM

## 2017-10-10 MED ORDER — PREDNISONE 10 MG PO TABS: ORAL_TABLET | ORAL | 0 refills | Status: AC

## 2017-10-10 NOTE — Patient Instructions (Signed)
     IF you received an x-ray today, you will receive an invoice from [facility]. Please contact [facility] at [phone] with questions or concerns regarding your invoice.   IF you received labwork today, you will receive an invoice from [facility]. Please contact [facility] at [phone] with questions or concerns regarding your invoice.   Our billing staff will not be able to assist you with questions regarding bills from these companies.  You will be contacted with the lab results as soon as they are available. The fastest way to get your results is to activate your My Chart account. Instructions are located on the last page of this paperwork. If you have not heard from us regarding the results in 2 weeks, please contact this office.     

## 2017-10-10 NOTE — Progress Notes (Signed)
2/28/201910:56 AM  Jeremy Hendricks 1955/10/07, 62 y.o. male [id number]  Chief Complaint  Patient presents with  . Pain     legs, knees and hip pain    HPI:   Patient is a 62 y.o. male with past medical history significant for CAD and gout who presents today for right knee pain  Patient was seen on 09/20/17 for presumed gout, colchicine for 2 days did not help so he started prednisone which helped tremendously, all pain resolved. However once completed treatment pain has been coming back, it was severe enough last night that he took an old hydrocodone he had left over.   He reports pain is deep in the knee, triggered by mostly twisting movement, knee is not red or warm, its is still swollen. He feels at times wobbly, it is starting to affect his lower back, buttocks and hip. He denies any trauma.   He would like to extend prednisone as it was working really well, feels course was too short  Depression screen [facility] 2/9 10/10/2017 10/03/2017 09/20/2017  Decreased Interest 0 0 0  Down, Depressed, Hopeless 0 0 0  PHQ - 2 Score 0 0 0    No Known Allergies  Prior to Admission medications   Medication Sig Start Date End Date Taking? Authorizing Provider  ALPRAZolam Duanne Moron) 0.5 MG tablet Take 1-2 tablets (0.5-1 mg total) by mouth at bedtime as needed for anxiety. 08/15/17  Yes Wendie Agreste, MD  aspirin EC 81 MG tablet Take 81 mg daily by mouth.   Yes [provider]  atenolol (TENORMIN) 25 MG tablet Take 1 tablet (25 mg total) by mouth daily. 08/20/17  Yes Dunn, Dayna N, PA-C  emtricitabine-tenofovir (TRUVADA) 200-300 MG tablet Take 1 tablet by mouth daily. 10/03/17  Yes Wendie Agreste, MD  furosemide (LASIX) 40 MG tablet Take 1 tablet by mouth daily and 1/2 tablet by mouth daily as needed for swelling 08/20/17  Yes Dunn, Dayna N, PA-C  HYDROcodone-acetaminophen (NORCO/VICODIN) 5-325 MG tablet Take 1 tablet by mouth every 6 (six) hours as needed for moderate pain. 09/20/17  Yes Rutherford Guys, MD  Multiple Vitamin (MULTIVITAMIN) tablet Take 1 tablet by mouth daily.     Yes [provider]  nitroGLYCERIN (NITROSTAT) 0.4 MG SL tablet Place 0.4 mg under the tongue every 5 (five) minutes as needed for chest pain. Reported on 09/21/2015   Yes [provider]  rosuvastatin (CRESTOR) 40 MG tablet Take 1 tablet (40 mg total) by mouth daily. 08/20/17  Yes Dunn, Nedra Hai, PA-C  Testosterone 30 MG/ACT SOLN Place 30 mg onto the skin every morning. 08/15/17  Yes Wendie Agreste, MD  colchicine 0.6 MG tablet Take 1 tablet (0.6 mg total) by mouth daily. On first day take twice a day, then decrease to once a day until symptoms resolve Patient not taking: Reported on 10/03/2017 09/20/17   Rutherford Guys, MD    Past Medical History:  Diagnosis Date  . Atrial fibrillation ([place])    postoperative  . Chronic diastolic CHF (congestive heart failure) ([place])   . Coronary artery disease    a. s/p CABG 2002. b. s/p redo 2012.  Marland Kitchen DJD (degenerative joint disease)   . Gout   . History of ETT    a. ETT 6/16:  normal    Past Surgical History:  Procedure Laterality Date  . CORONARY ARTERY BYPASS GRAFT  2002  . CORONARY ARTERY BYPASS GRAFT  08-2010   L-LAD  remained from original CABG; new grafts incl L radial- PDA + RIMA-RI  . VASECTOMY      Social History   Tobacco Use  . Smoking status: Former Smoker    Last attempt to quit: 08/14/1999    Years since quitting: 18.1  . Smokeless tobacco: Never Used  Substance Use Topics  . Alcohol use: Yes    Alcohol/week: 8.4 oz    Types: 14 Standard drinks or equivalent per week    Family History  Problem Relation Age of Onset  . Heart attack Father 21  . Hypertension Father   . Cancer Mother        Breast cancer  . Cancer Unknown        family hx of  . Coronary artery disease Unknown        family hx of  . Hyperlipidemia Unknown        family hx of  . Hypertension Paternal Grandfather   . Stroke Neg Hx   . Colon cancer Neg Hx      ROS Per hpi  OBJECTIVE:  Blood pressure 120/84, pulse 64, temperature 97.9 F (36.6 C), temperature source Oral, height 5\' 7"  (1.702 m), weight 185 lb (83.9 kg), SpO2 96 %.  Physical Exam  Gen: AAOx3, NAD Right knee: skin is intact, not warm to touch, no erythema, + swelling. No bony tenderness. FROM, no ligament laxity of ant/post drawer or valgus/varus stress, neg Mcmurray, equivocal thesley. No patellar fullness. Right HIP non tender, FROM, right low back with mild paraspinal TTP, no spinous TTP, neg SLR. +2 reflexes.     No results found.   ASSESSMENT and PLAN  1. Right knee pain, unspecified chronicity Treating empirically for rebound gout after too short pred course, however discussed with patient that ortho referral should be considered if not resolved as that would be highly suggestive of another pathology, maybe meniscal given equivocal thesley. Prednisone r/se/b reviewed. Patient needs to leave but will come back for knee xray.  - Uric Acid - CBC with Differential - Sedimentation Rate - C-reactive protein - DG Knee Complete 4 Views Right; Future  Other orders - predniSONE (DELTASONE) 10 MG tablet; Take 4 tablets (40 mg total) by mouth daily with breakfast for 3 days, THEN 3 tablets (30 mg total) daily with breakfast for 3 days, THEN 2 tablets (20 mg total) daily with breakfast for 3 days, THEN 1 tablet (10 mg total) daily with breakfast for 3 days, THEN 0.5 tablets (5 mg total) daily with breakfast for 3 days.  Return if symptoms worsen or fail to improve.    Rutherford Guys, MD [facility] [place], [place] [address] Ph.  [phone] Fax [phone]

## 2017-10-11 LAB — CBC WITH DIFFERENTIAL/PLATELET
Basophils Absolute: 0 10*3/uL (ref 0.0–0.2)
Basos: 0 %
EOS (ABSOLUTE): 0.2 10*3/uL (ref 0.0–0.4)
Eos: 3 %
Hematocrit: 44.6 % (ref 37.5–51.0)
Hemoglobin: 15 g/dL (ref 13.0–17.7)
Immature Grans (Abs): 0 10*3/uL (ref 0.0–0.1)
Immature Granulocytes: 0 %
Lymphocytes Absolute: 1.3 10*3/uL (ref 0.7–3.1)
Lymphs: 16 %
MCH: 30.7 pg (ref 26.6–33.0)
MCHC: 33.6 g/dL (ref 31.5–35.7)
MCV: 91 fL (ref 79–97)
Monocytes Absolute: 1.4 10*3/uL — ABNORMAL HIGH (ref 0.1–0.9)
Monocytes: 18 %
Neutrophils Absolute: 4.9 10*3/uL (ref 1.4–7.0)
Neutrophils: 63 %
Platelets: 262 10*3/uL (ref 150–379)
RBC: 4.88 x10E6/uL (ref 4.14–5.80)
RDW: 13.4 % (ref 12.3–15.4)
WBC: 7.8 10*3/uL (ref 3.4–10.8)

## 2017-10-11 LAB — URIC ACID: Uric Acid: 10.3 mg/dL — ABNORMAL HIGH (ref 3.7–8.6)

## 2017-10-11 LAB — C-REACTIVE PROTEIN: CRP: 3.6 mg/L (ref 0.0–4.9)

## 2017-10-11 LAB — SEDIMENTATION RATE: Sed Rate: 2 mm/hr (ref 0–30)

## 2017-10-31 ENCOUNTER — Encounter: Payer: Self-pay | Admitting: Urgent Care

## 2017-10-31 ENCOUNTER — Ambulatory Visit: Payer: BC Managed Care – PPO | Admitting: Urgent Care

## 2017-10-31 VITALS — BP 120/72 | HR 61 | Temp 98.4°F | Resp 16 | Ht 67.0 in | Wt 184.4 lb

## 2017-10-31 DIAGNOSIS — R0982 Postnasal drip: Secondary | ICD-10-CM

## 2017-10-31 DIAGNOSIS — J3489 Other specified disorders of nose and nasal sinuses: Secondary | ICD-10-CM | POA: Diagnosis not present

## 2017-10-31 DIAGNOSIS — Z9109 Other allergy status, other than to drugs and biological substances: Secondary | ICD-10-CM | POA: Diagnosis not present

## 2017-10-31 DIAGNOSIS — K146 Glossodynia: Secondary | ICD-10-CM

## 2017-10-31 DIAGNOSIS — R0981 Nasal congestion: Secondary | ICD-10-CM

## 2017-10-31 DIAGNOSIS — R070 Pain in throat: Secondary | ICD-10-CM

## 2017-10-31 DIAGNOSIS — H9203 Otalgia, bilateral: Secondary | ICD-10-CM

## 2017-10-31 MED ORDER — CETIRIZINE HCL 10 MG PO TABS
10.0000 mg | ORAL_TABLET | Freq: Every day | ORAL | 11 refills | Status: DC
Start: 2017-10-31 — End: 2018-03-13

## 2017-10-31 MED ORDER — AMOXICILLIN 500 MG PO CAPS: 500.0000 mg | ORAL_CAPSULE | Freq: Three times a day (TID) | ORAL | 0 refills | Status: DC

## 2017-10-31 NOTE — Patient Instructions (Addendum)
Viral Respiratory Infection A respiratory infection is an illness that affects part of the respiratory system, such as the lungs, nose, or throat. Most respiratory infections are caused by either viruses or bacteria. A respiratory infection that is caused by a virus is called a viral respiratory infection. Common types of viral respiratory infections include:  A cold.  The flu (influenza).  A respiratory syncytial virus (RSV) infection.  How do I know if I have a viral respiratory infection? Most viral respiratory infections cause:  A stuffy or runny nose.  Yellow or green nasal discharge.  A cough.  Sneezing.  Fatigue.  Achy muscles.  A sore throat.  Sweating or chills.  A fever.  A headache.  How are viral respiratory infections treated? If influenza is diagnosed early, it may be treated with an antiviral medicine that shortens the length of time a person has symptoms. Symptoms of viral respiratory infections may be treated with over-the-counter and prescription medicines, such as:  Expectorants. These make it easier to cough up mucus.  Decongestant nasal sprays.  Health care providers do not prescribe antibiotic medicines for viral infections. This is because antibiotics are designed to kill bacteria. They have no effect on viruses. How do I know if I should stay home from work or school? To avoid exposing others to your respiratory infection, stay home if you have:  A fever.  A persistent cough.  A sore throat.  A runny nose.  Sneezing.  Muscles aches.  Headaches.  Fatigue.  Weakness.  Chills.  Sweating.  Nausea.  Follow these instructions at home:  Rest as much as possible.  Take over-the-counter and prescription medicines only as told by your health care provider.  Drink enough fluid to keep your urine clear or pale yellow. This helps prevent dehydration and helps loosen up mucus.  Gargle with a salt-water mixture 3-4 times per day or  as needed. To make a salt-water mixture, completely dissolve -1 tsp of salt in 1 cup of warm water.  Use nose drops made from salt water to ease congestion and soften raw skin around your nose.  Do not drink alcohol.  Do not use tobacco products, including cigarettes, chewing tobacco, and e-cigarettes. If you need help quitting, ask your health care provider. Contact a health care provider if:  Your symptoms last for 10 days or longer.  Your symptoms get worse over time.  You have a fever.  You have severe sinus pain in your face or forehead.  The glands in your jaw or neck become very swollen. Get help right away if:  You feel pain or pressure in your chest.  You have shortness of breath.  You faint or feel like you will faint.  You have severe and persistent vomiting.  You feel confused or disoriented. This information is not intended to replace advice given to you by your health care provider. Make sure you discuss any questions you have with your health care provider. Document Released: 05/09/2005 Document Revised: 01/05/2016 Document Reviewed: 01/05/2015 Elsevier Interactive Patient Education  2018 [place].    Salivary Gland Infection A salivary gland infection is an infection in one or more of the glands that produce spit (saliva). You have six major salivary glands. Each gland has a duct that carries saliva into your mouth. Saliva keeps your mouth moist and breaks down the food that you eat. It also helps to prevent tooth decay. Two salivary glands are located just in front of your ears (parotid). The  ducts for these glands open up inside your cheeks, near your back teeth. You also have two glands under your tongue (sublingual) and two glands under your jaw (submandibular). The ducts for these glands open under your tongue. Any salivary gland can become infected. Most infections occur in the parotid glands or submandibular glands. What are the causes? Salivary  glands can be infected by viruses or bacteria.  The mumps virus is the most common cause of viral salivary gland infections, though mumps is now rare in many areas because of vaccination. ? This infection causes swelling in both parotid glands. ? Viral infections are more common in children.  The bacteria that cause salivary gland infections are usually the same bacteria that normally live in your mouth. ? A stone can form in a salivary gland and block the flow of saliva. As a result, saliva backs up into the salivary gland. Bacteria may then start to grow behind the blockage and cause infection. ? Bacterial infections usually cause pain and swelling on one side of the face. Submandibular gland swelling occurs under the jaw. Parotid swelling occurs in front of the ear. ? Bacterial infections are more common in adults.  What increases the risk? Children who do not get the MMR (measles, mumps, rubella) vaccine are more likely to get mumps, which can cause a viral salivary gland infection. Risk factors for bacterial infections include:  Poor dental care (oral hygiene).  Smoking.  Not drinking enough water.  Having a disease that causes dry mouth and dry eyes (Mikulicz syndrome or Sjogren syndrome).  What are the signs or symptoms? The main sign of salivary gland infection is a swollen salivary gland. This type of inflammation is often called sialadenitis. You may have swelling in front of your ear, under your jaw, or under your tongue. Swelling may get worse when you eat and decrease after you eat. Other signs and symptoms include:  Pain.  Tenderness.  Redness.  Dry mouth.  Bad taste in your mouth.  Difficulty chewing and swallowing.  Fever.  How is this diagnosed? Your health care provider may suspect a salivary gland infection based on your signs and symptoms. He or she will also do a physical exam. The health care provider will look and feel inside your mouth to see whether a  stone is blocking a salivary gland duct. You may need to see an ear, nose, and throat specialist (ENT or otolaryngologist) for diagnosis and treatment. You may also need to have diagnostic tests, such as:  An X-ray to check for a stone.  Other imaging studies to look for an abscess and to rule out other causes of swelling. These tests may include: ? Ultrasound. ? CT scan. ? MRI.  Culture and sensitivity test. This involves collecting a sample of pus for testing in the lab to see what bacteria grow and what antibiotics they are sensitive to. The testing sample may be: ? Swabbed from a salivary gland duct. ? Withdrawn from a swollen gland with a needle (aspiration).  How is this treated? Viral salivary gland infections usually clear up without treatment. Bacterial infections are usually treated with antibiotic medicine. Severe infections that cause difficulty with swallowing may be treated with an IV antibiotic in the hospital. Other treatments may include:  Probing and widening the salivary duct to allow a stone to pass. In some cases, a thin, flexible scope (endoscope) may be inserted into the duct to find a stone and remove it.  Breaking up a stone  using sound waves.  Draining an infected gland (abscess) with a needle.  In some cases, you may need surgery so your health care provider can: ? Remove a stone. ? Drain pus from an abscess. ? Remove a badly infected gland.  Follow these instructions at home:  Take medicines only as directed by your health care provider.  If you were prescribed an antibiotic medicine, finish it all even if you start to feel better.  Follow these instructions every few hours: ? Suck on a lemon candy to stimulate the flow of saliva. ? Put a warm compress over the gland. ? Gently massage the gland.  Drink enough fluid to keep your urine clear or pale yellow.  Rinse your mouth with a mixture of warm water and salt every few hours. To make this mixture,  add a pinch of salt to 1 cup of warm water.  Practice good oral hygiene by brushing and flossing your teeth after meals and before you go to bed.  Do not use any tobacco products, including cigarettes, chewing tobacco, or electronic cigarettes. If you need help quitting, ask your health care provider. Contact a health care provider if:  You have pain and swelling in your face, jaw, or mouth after eating.  You have persistent swelling in any of these places: ? In front of your ear. ? Under your jaw. ? Inside your mouth. Get help right away if:  You have pain and swelling in your face, jaw, or mouth that are getting worse.  Your pain and swelling make it hard to swallow or breathe. This information is not intended to replace advice given to you by your health care provider. Make sure you discuss any questions you have with your health care provider. Document Released: 09/06/2004 Document Revised: 01/05/2016 Document Reviewed: 12/30/2013 Elsevier Interactive Patient Education  2018 [employer].      IF you received an x-ray today, you will receive an invoice from [facility]. Please contact [facility] at [phone] with questions or concerns regarding your invoice.   IF you received labwork today, you will receive an invoice from [facility]. Please contact [facility] at [phone] with questions or concerns regarding your invoice.   Our billing staff will not be able to assist you with questions regarding bills from these companies.  You will be contacted with the lab results as soon as they are available. The fastest way to get your results is to activate your My Chart account. Instructions are located on the last page of this paperwork. If you have not heard from [place] regarding the results in 2 weeks, please contact this office.

## 2017-10-31 NOTE — Progress Notes (Signed)
   MRN: [id number] DOB: 1956/03/05  Subjective:   Jeremy Hendricks is a 62 y.o. male presenting for 1 week history of bilateral ear pain, sinus pain, intermittent sinus congestion, sore throat. Also has a mild cough. Patient is concerned that he has a blocked salivary gland, has a history of this several years ago which was resolved with antibiotics. Reports tongue pain, deep oral pain. Denies fever, difficulty swallowing, chest pain, shob, n/v, abdominal pain. Denies smoking cigarettes.  Jeremy Hendricks has a current medication list which includes the following prescription(s): alprazolam, aspirin ec, atenolol, emtricitabine-tenofovir, furosemide, hydrocodone-acetaminophen, multivitamin, nitroglycerin, rosuvastatin, and testosterone. Also has No Known Allergies.  Jeremy Hendricks  has a past medical history of Atrial fibrillation ([place]), Chronic diastolic CHF (congestive heart failure) ([place]), Coronary artery disease, DJD (degenerative joint disease), Gout, and History of ETT. 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  Objective:   Vitals: BP 120/72   Pulse 61   Temp 98.4 F (36.9 C) (Oral)   Resp 16   Ht 5\' 7"  (1.702 m)   Wt 184 lb 6.4 oz (83.6 kg)   SpO2 99%   BMI 28.88 kg/m   Physical Exam  Constitutional: He is oriented to person, place, and time. He appears well-developed and well-nourished.  HENT:  Throat with post-nasal drainage. TM's with slight air-fluid level but no erythema, drainage of pus or perforations. Bilateral maxillary sinus tenderness. Tongue without erythema, swelling. No evidence of sialolithiasis externally. Mucous membranes moist.  Eyes: Right eye exhibits no discharge. Left eye exhibits no discharge. No scleral icterus.  Cardiovascular: Normal rate, regular rhythm and intact distal pulses. Exam reveals no gallop and no friction rub.  No murmur heard. Pulmonary/Chest: No respiratory  distress. He has no wheezes. He has no rales.  Neurological: He is alert and oriented to person, place, and time.  Skin: Skin is warm and dry.  Psychiatric: He has a normal mood and affect.   Assessment and Plan :   Ear pain, bilateral  Throat pain  Nasal congestion  Post-nasal drainage  Sinus pain  Tongue pain - Plan: Ambulatory referral to ENT  Environmental allergies  Will manage supportively for viral illness. Provided patient with a script for amoxicillin in case his symptoms do not improve. Will refer to ENT given patient's concerns for blocked/infected salivary glands. Return-to-clinic precautions discussed, patient verbalized understanding.   Jeremy Eagles, PA-C [facility] [phone] 10/31/2017  9:46 AM

## 2017-11-20 ENCOUNTER — Encounter: Payer: Self-pay | Admitting: Physician Assistant

## 2017-12-11 ENCOUNTER — Other Ambulatory Visit: Payer: Self-pay | Admitting: Family Medicine

## 2017-12-11 ENCOUNTER — Encounter: Payer: Self-pay | Admitting: Family Medicine

## 2017-12-12 ENCOUNTER — Encounter: Payer: Self-pay | Admitting: Urgent Care

## 2017-12-12 ENCOUNTER — Ambulatory Visit: Payer: BC Managed Care – PPO | Admitting: Urgent Care

## 2017-12-12 ENCOUNTER — Other Ambulatory Visit: Payer: Self-pay

## 2017-12-12 VITALS — BP 102/62 | HR 65 | Temp 97.5°F | Ht 67.0 in | Wt 182.2 lb

## 2017-12-12 DIAGNOSIS — Z789 Other specified health status: Secondary | ICD-10-CM

## 2017-12-12 DIAGNOSIS — M109 Gout, unspecified: Secondary | ICD-10-CM

## 2017-12-12 DIAGNOSIS — B353 Tinea pedis: Secondary | ICD-10-CM

## 2017-12-12 DIAGNOSIS — M79672 Pain in left foot: Secondary | ICD-10-CM

## 2017-12-12 DIAGNOSIS — M7989 Other specified soft tissue disorders: Secondary | ICD-10-CM | POA: Diagnosis not present

## 2017-12-12 DIAGNOSIS — Z7289 Other problems related to lifestyle: Secondary | ICD-10-CM

## 2017-12-12 MED ORDER — ALPRAZOLAM 0.5 MG PO TABS: 0.5000 mg | ORAL_TABLET | Freq: Every evening | ORAL | 0 refills | Status: DC | PRN

## 2017-12-12 MED ORDER — FLUCONAZOLE 150 MG PO TABS: 150.0000 mg | ORAL_TABLET | ORAL | 0 refills | Status: DC

## 2017-12-12 MED ORDER — PREDNISONE 20 MG PO TABS: ORAL_TABLET | ORAL | 0 refills | Status: DC

## 2017-12-12 NOTE — Patient Instructions (Addendum)
Gout Gout is painful swelling that can occur in some of your joints. Gout is a type of arthritis. This condition is caused by having too much uric acid in your body. Uric acid is a chemical that forms when your body breaks down substances called purines. Purines are important for building body proteins. When your body has too much uric acid, sharp crystals can form and build up inside your joints. This causes pain and swelling. Gout attacks can happen quickly and be very painful (acute gout). Over time, the attacks can affect more joints and become more frequent (chronic gout). Gout can also cause uric acid to build up under your skin and inside your kidneys. What are the causes? This condition is caused by too much uric acid in your blood. This can occur because:  Your kidneys do not remove enough uric acid from your blood. This is the most common cause.  Your body makes too much uric acid. This can occur with some cancers and cancer treatments. It can also occur if your body is breaking down too many red blood cells (hemolytic anemia).  You eat too many foods that are high in purines. These foods include organ meats and some seafood. Alcohol, especially beer, is also high in purines.  A gout attack may be triggered by trauma or stress. What increases the risk? This condition is more likely to develop in people who:  Have a family history of gout.  Are male and middle-aged.  Are male and have gone through menopause.  Are obese.  Frequently drink alcohol, especially beer.  Are dehydrated.  Lose weight too quickly.  Have an organ transplant.  Have lead poisoning.  Take certain medicines, including aspirin, cyclosporine, diuretics, levodopa, and niacin.  Have kidney disease or psoriasis.  What are the signs or symptoms? An attack of acute gout happens quickly. It usually occurs in just one joint. The most common place is the big toe. Attacks often start at night. Other joints  that may be affected include joints of the feet, ankle, knee, fingers, wrist, or elbow. Symptoms may include:  Severe pain.  Warmth.  Swelling.  Stiffness.  Tenderness. The affected joint may be very painful to touch.  Shiny, red, or purple skin.  Chills and fever.  Chronic gout may cause symptoms more frequently. More joints may be involved. You may also have white or yellow lumps (tophi) on your hands or feet or in other areas near your joints. How is this diagnosed? This condition is diagnosed based on your symptoms, medical history, and physical exam. You may have tests, such as:  Blood tests to measure uric acid levels.  Removal of joint fluid with a needle (aspiration) to look for uric acid crystals.  X-rays to look for joint damage.  How is this treated? Treatment for this condition has two phases: treating an acute attack and preventing future attacks. Acute gout treatment may include medicines to reduce pain and swelling, including:  NSAIDs.  Steroids. These are strong anti-inflammatory medicines that can be taken by mouth (orally) or injected into a joint.  Colchicine. This medicine relieves pain and swelling when it is taken soon after an attack. It can be given orally or through an IV tube.  Preventive treatment may include:  Daily use of smaller doses of NSAIDs or colchicine.  Use of a medicine that reduces uric acid levels in your blood.  Changes to your diet. You may need to see a specialist about healthy eating (dietitian).  Follow these instructions at home: During a Gout Attack  If directed, apply ice to the affected area: ? Put ice in a plastic bag. ? Place a towel between your skin and the bag. ? Leave the ice on for 20 minutes, 2-3 times a day.  Rest the joint as much as possible. If the affected joint is in your leg, you may be given crutches to use.  Raise (elevate) the affected joint above the level of your heart as often as  possible.  Drink enough fluids to keep your urine clear or pale yellow.  Take over-the-counter and prescription medicines only as told by your health care provider.  Do not drive or operate heavy machinery while taking prescription pain medicine.  Follow instructions from your health care provider about eating or drinking restrictions.  Return to your normal activities as told by your health care provider. Ask your health care provider what activities are safe for you. Avoiding Future Gout Attacks  Follow a low-purine diet as told by your dietitian or health care provider. Avoid foods and drinks that are high in purines, including liver, kidney, anchovies, asparagus, herring, mushrooms, mussels, and beer.  Limit alcohol intake to no more than 1 drink a day for nonpregnant women and 2 drinks a day for men. One drink equals 12 oz of beer, 5 oz of wine, or 1 oz of hard liquor.  Maintain a healthy weight or lose weight if you are overweight. If you want to lose weight, talk with your health care provider. It is important that you do not lose weight too quickly.  Start or maintain an exercise program as told by your health care provider.  Drink enough fluids to keep your urine clear or pale yellow.  Take over-the-counter and prescription medicines only as told by your health care provider.  Keep all follow-up visits as told by your health care provider. This is important. Contact a health care provider if:  You have another gout attack.  You continue to have symptoms of a gout attack after10 days of treatment.  You have side effects from your medicines.  You have chills or a fever.  You have burning pain when you urinate.  You have pain in your lower back or belly. Get help right away if:  You have severe or uncontrolled pain.  You cannot urinate. This information is not intended to replace advice given to you by your health care provider. Make sure you discuss any questions  you have with your health care provider. Document Released: 07/27/2000 Document Revised: 01/05/2016 Document Reviewed: 05/12/2015 Elsevier Interactive Patient Education  2018 [employer].     IF you received an x-ray today, you will receive an invoice from [facility]. Please contact [facility] at [phone] with questions or concerns regarding your invoice.   IF you received labwork today, you will receive an invoice from [facility]. Please contact [facility] at [phone] with questions or concerns regarding your invoice.   Our billing staff will not be able to assist you with questions regarding bills from these companies.  You will be contacted with the lab results as soon as they are available. The fastest way to get your results is to activate your My Chart account. Instructions are located on the last page of this paperwork. If you have not heard from [place] regarding the results in 2 weeks, please contact this office.

## 2017-12-12 NOTE — Progress Notes (Signed)
   MRN: [id number] DOB: 1956/05/27  Subjective:   Jeremy Hendricks is a 62 y.o. male presenting for 4-day history of recurrent gout flare over his left foot and ankle.  He previously had gout over his left first great toe.  This episode is over the left fifth toe extending into lateral portion of distal foot including redness and swelling.  He is also having exquisite left ankle pain.  He has previously used prednisone with good results.  He is on multiple medications that pose a high risk to use NSAIDs.  He prefers to avoid this.  He is also requesting medication for tinea pedis resistant to over-the-counter treatments.  Jeremy Hendricks has a current medication list which includes the following prescription(s): alprazolam, amoxicillin, aspirin ec, atenolol, cetirizine, emtricitabine-tenofovir, furosemide, hydrocodone-acetaminophen, multivitamin, nitroglycerin, rosuvastatin, and testosterone. Also has No Known Allergies.  Jeremy Hendricks  has a past medical history of Atrial fibrillation ([place]), Chronic diastolic CHF (congestive heart failure) ([place]), Coronary artery disease, DJD (degenerative joint disease), Gout, and History of ETT. 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  Objective:   Vitals: BP 102/62 (BP Location: Left Arm, Patient Position: Sitting, Cuff Size: Normal)   Pulse 65   Temp (!) 97.5 F (36.4 C) (Oral)   Ht 5\' 7"  (1.702 m)   Wt 182 lb 3.2 oz (82.6 kg)   SpO2 96%   BMI 28.54 kg/m   Physical Exam  Constitutional: He is oriented to person, place, and time. He appears well-developed and well-nourished.  Cardiovascular: Normal rate.  Pulmonary/Chest: Effort normal.  Musculoskeletal:  Patient has exquisite tenderness at left fifth MTP with associated redness and swelling.  He also has left ankle tenderness worse over the lateral portion.  Neurological: He is alert and oriented to person, place, and  time.  Skin:  Patient has hyperkeratotic skin over toes, macerated skin in between webspaces and dry scaly skin on the underside of his foot extending into medial aspect of his foot.   Assessment and Plan :   Acute gout involving toe of left foot, unspecified cause - Plan: Uric acid  Swelling of left foot - Plan: Uric acid  Left foot pain - Plan: Uric acid  Alcohol use - Plan: ALPRAZolam (XANAX) 0.5 MG tablet  Will start prednisone for patient to treat his gout.  We will also try to use Xanax for any potential alcohol withdrawal symptoms.  Patient will get a prescription for Diflucan to cover for tinea pedis.  Follow-up in 4 weeks.  Jaynee Eagles, PA-C [facility] [place] [phone] 12/12/2017  12:16 PM

## 2017-12-13 LAB — URIC ACID: Uric Acid: 9.5 mg/dL — ABNORMAL HIGH (ref 3.7–8.6)

## 2017-12-19 ENCOUNTER — Other Ambulatory Visit: Payer: Self-pay | Admitting: Family Medicine

## 2017-12-19 DIAGNOSIS — R7989 Other specified abnormal findings of blood chemistry: Secondary | ICD-10-CM

## 2017-12-19 DIAGNOSIS — E291 Testicular hypofunction: Secondary | ICD-10-CM

## 2017-12-19 NOTE — Telephone Encounter (Signed)
Refill

## 2017-12-19 NOTE — Telephone Encounter (Signed)
Testosterone 30 mg/1.76ml pump refill request  LOV 12/12/17 with Dr. Carlota Raspberry  [facility] [address], [place] - [place]. [address]

## 2017-12-20 NOTE — Telephone Encounter (Signed)
Has planned follow up this month.  Refilled.

## 2017-12-30 ENCOUNTER — Ambulatory Visit: Payer: BC Managed Care – PPO | Admitting: Family Medicine

## 2017-12-30 ENCOUNTER — Encounter: Payer: Self-pay | Admitting: Family Medicine

## 2017-12-30 ENCOUNTER — Other Ambulatory Visit: Payer: Self-pay

## 2017-12-30 VITALS — BP 120/60 | HR 65 | Temp 98.0°F | Ht 67.5 in | Wt 179.0 lb

## 2017-12-30 DIAGNOSIS — Z113 Encounter for screening for infections with a predominantly sexual mode of transmission: Secondary | ICD-10-CM

## 2017-12-30 DIAGNOSIS — Z5181 Encounter for therapeutic drug level monitoring: Secondary | ICD-10-CM | POA: Diagnosis not present

## 2017-12-30 DIAGNOSIS — M109 Gout, unspecified: Secondary | ICD-10-CM | POA: Diagnosis not present

## 2017-12-30 MED ORDER — PREDNISONE 20 MG PO TABS: ORAL_TABLET | ORAL | 0 refills | Status: DC

## 2017-12-30 NOTE — Progress Notes (Signed)
Subjective:  By signing my name below, I, Moises Blood, attest that this documentation has been prepared under the direction and in the presence of [name], MD. Electronically Signed: Moises Blood, [place]. 12/30/2017 , 9:42 AM .  Patient was seen in Room 12 .   Patient ID: Jeremy Hendricks, male    DOB: 1955/12/17, 62 y.o.   MRN: [id number] Chief Complaint  Patient presents with  . Chronic Conditions    3 month follow up with med check   HPI Jeremy Hendricks is a 62 y.o. male  Here for follow up. He isn't fasting today.   Pre exposure prophylaxis He was started on Truvada on Feb 21st, initial STI testing was non reactive, and normal renal function. Repeat testing today. He denies any complications with the Truvada. He notes sometimes having unprotected intercourse. He denies urinary symptoms, fever, or bumps in genital area.   Gout See recent patient message, he's taken prednisone in the past for flares. His last uric acid was 9.5 on May 2nd; seen for acute gout flare on May 2nd by Jaynee Eagles, PA-C. He reports 3 gout flares in the past 6 months. He stopped drinking alcohol 2-3 weeks ago. He hasn't taken allopurinol in the past. He usually takes 2 prednisone per day, 2-3 days in a row, when he does have a gout flare. His gout has affected multiple joints.   Tinea pedis He was treated with Diflucan 150mg  qWeek x4, prescribed on May 2nd.   Hyperlipidemia Lab Results  Component Value Date   CHOL 91 (L) 08/15/2017   HDL 30 (L) 08/15/2017   LDLCALC 42 08/15/2017   TRIG 97 08/15/2017   CHOLHDL 3.0 08/15/2017    Lab Results  Component Value Date   ALT 25 08/15/2017   AST 24 08/15/2017   ALKPHOS 64 08/15/2017   BILITOT 0.4 08/15/2017    Hypogonadism He takes testosterone 30mg  QD, last testosterone checked in Jan was normal at 818. PSA normal at that time, and normal CBC in February.   Patient Active Problem List   Diagnosis Date Noted  . History of ETT   . Gout   .  DJD (degenerative joint disease)   . Coronary artery disease   . Atrial fibrillation ([place])   . 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 HLD (hyperlipidemia) 06/11/2016  . Hypogonadism in male 09/09/2015  . INSOMNIA 09/19/2010  . SHORTNESS OF BREATH 09/19/2010  . CHEST PAIN-PRECORDIAL 08/23/2010  . CAD, ARTERY BYPASS GRAFT 10/26/2009  . HYPERLIPIDEMIA 10/24/2009  . DEGENERATIVE JOINT DISEASE 10/24/2009  . ANGINA, HX OF 10/24/2009   Past Medical History:  Diagnosis Date  . Atrial fibrillation ([place])    postoperative  . Chronic diastolic CHF (congestive heart failure) ([employer])   . Coronary artery disease    a. s/p CABG 2002. b. s/p redo 2012.  Marland Kitchen DJD (degenerative joint disease)   . Gout   . History of ETT    a. ETT 6/16:  normal   Past Surgical History:  Procedure Laterality Date  . CORONARY ARTERY BYPASS GRAFT  2002  . CORONARY ARTERY BYPASS GRAFT  08-2010   L-LAD remained from original CABG; new grafts incl L radial- PDA + RIMA-RI  . VASECTOMY     No Known Allergies Prior to Admission medications   Medication Sig Start Date End Date Taking? Authorizing Provider  ALPRAZolam Duanne Moron) 0.5 MG tablet Take 1 tablet (0.5 mg total) by mouth at bedtime as needed (alcohol withdrawal). 12/12/17  Yes Jaynee Eagles,  PA-C  aspirin EC 81 MG tablet Take 81 mg daily by mouth.   Yes [provider]  atenolol (TENORMIN) 25 MG tablet Take 1 tablet (25 mg total) by mouth daily. 08/20/17  Yes Dunn, Dayna N, PA-C  cetirizine (ZYRTEC) 10 MG tablet Take 1 tablet (10 mg total) by mouth daily. 10/31/17  Yes Jaynee Eagles, PA-C  emtricitabine-tenofovir (TRUVADA) 200-300 MG tablet Take 1 tablet by mouth daily. 10/03/17  Yes Wendie Agreste, MD  fluconazole (DIFLUCAN) 150 MG tablet Take 1 tablet (150 mg total) by mouth once a week. Repeat if needed 12/12/17  Yes Jaynee Eagles, PA-C  furosemide (LASIX) 40 MG tablet Take 1 tablet by mouth daily and 1/2 tablet by mouth daily as needed for swelling 08/20/17  Yes Dunn, Dayna N, PA-C    HYDROcodone-acetaminophen (NORCO/VICODIN) 5-325 MG tablet Take 1 tablet by mouth every 6 (six) hours as needed for moderate pain. 09/20/17  Yes Rutherford Guys, MD  Multiple Vitamin (MULTIVITAMIN) tablet Take 1 tablet by mouth daily.     Yes [provider]  nitroGLYCERIN (NITROSTAT) 0.4 MG SL tablet Place 0.4 mg under the tongue every 5 (five) minutes as needed for chest pain. Reported on 09/21/2015   Yes [provider]  predniSONE (DELTASONE) 20 MG tablet Take 2 tablets daily with breakfast. 12/12/17  Yes Jaynee Eagles, PA-C  rosuvastatin (CRESTOR) 40 MG tablet Take 1 tablet (40 mg total) by mouth daily. 08/20/17  Yes Dunn, Nedra Hai, PA-C  Testosterone 30 MG/ACT SOLN PLACE 30MG  ONTO THE SKIN EVERY MORNING 12/20/17  Yes Wendie Agreste, MD   Social History   Socioeconomic History  . Marital status: Single    Spouse name: Not on file  . Number of children: 2  . Years of education: Not on file  . Highest education level: Not on file  Occupational History  . Occupation: Cabin crew  Social Needs  . Financial resource strain: Not on file  . Food insecurity:    Worry: Not on file    Inability: Not on file  . Transportation needs:    Medical: Not on file    Non-medical: Not on file  Tobacco Use  . Smoking status: Former Smoker    Last attempt to quit: 08/14/1999    Years since quitting: 18.3  . Smokeless tobacco: Never Used  Substance and Sexual Activity  . Alcohol use: Yes    Alcohol/week: 8.4 oz    Types: 14 Standard drinks or equivalent per week  . Drug use: No  . Sexual activity: Yes  Lifestyle  . Physical activity:    Days per week: Not on file    Minutes per session: Not on file  . Stress: Not on file  Relationships  . Social connections:    Talks on phone: Not on file    Gets together: Not on file    Attends religious service: Not on file    Active member of club or organization: Not on file    Attends meetings of clubs or organizations: Not on file     Relationship status: Not on file  . Intimate partner violence:    Fear of current or ex partner: Not on file    Emotionally abused: Not on file    Physically abused: Not on file    Forced sexual activity: Not on file  Other Topics Concern  . Not on file  Social History Narrative   Single   Education: College   Exercise: Yes  Review of Systems  Constitutional: Negative for fatigue and unexpected weight change.  Eyes: Negative for visual disturbance.  Respiratory: Negative for cough, chest tightness and shortness of breath.   Cardiovascular: Negative for chest pain, palpitations and leg swelling.  Gastrointestinal: Negative for abdominal pain and blood in stool.  Neurological: Negative for dizziness, light-headedness and headaches.       Objective:   Physical Exam  Constitutional: He is oriented to person, place, and time. He appears well-developed and well-nourished.  HENT:  Head: Normocephalic and atraumatic.  Eyes: Pupils are equal, round, and reactive to light. EOM are normal.  Neck: No JVD present. Carotid bruit is not present.  Cardiovascular: Normal rate, regular rhythm and normal heart sounds.  No murmur heard. Pulmonary/Chest: Effort normal and breath sounds normal. He has no rales.  Musculoskeletal: He exhibits no edema.  Neurological: He is alert and oriented to person, place, and time.  Skin: Skin is warm and dry.  Psychiatric: He has a normal mood and affect.  Vitals reviewed.   Vitals:   12/30/17 0841  BP: 120/60  Pulse: 65  Temp: 98 F (36.7 C)  TempSrc: Oral  SpO2: 97%  Weight: 179 lb (81.2 kg)  Height: 5' 7.5" (1.715 m)       Assessment & Plan:    Jeremy Hendricks is a 62 y.o. male Routine screening for STI (sexually transmitted infection) - Plan: HIV antibody, RPR, GC/Chlamydia Probe Amp Medication monitoring encounter - Plan: Basic metabolic panel   - tolerating preexposure prophylaxis. Continue same dose, check renal function, sti testing.     Gout of multiple sites, unspecified cause, unspecified chronicity - Plan: Uric Acid, predniSONE (DELTASONE) 20 MG tablet  - elevated uric acid prior. Discussed lower uric acid goal and would consider allopurinol, but would like to see if improved control with decrease in alcohol use first.   - prednisone refilled for acute flair if needed - potential side effects discussed.    Meds ordered this encounter  Medications  . predniSONE (DELTASONE) 20 MG tablet    Sig: Take 2 tablets daily with breakfast.    Dispense:  12 tablet    Refill:  0   Patient Instructions    For gout - I would consider allopurinol once per day to lessen frequency of flairs. Cutting back on alcohol may be sufficient.  I will prescribe prednisone for next flair if needed.   No change in Truvada for now. I will check testing today, but return in 3 months for repeat testing. Fasting at that visit, and ideally early morning for accurate testosterone testing.   Thanks for coming in today.    Gout Gout is painful swelling that can occur in some of your joints. Gout is a type of arthritis. This condition is caused by having too much uric acid in your body. Uric acid is a chemical that forms when your body breaks down substances called purines. Purines are important for building body proteins. When your body has too much uric acid, sharp crystals can form and build up inside your joints. This causes pain and swelling. Gout attacks can happen quickly and be very painful (acute gout). Over time, the attacks can affect more joints and become more frequent (chronic gout). Gout can also cause uric acid to build up under your skin and inside your kidneys. What are the causes? This condition is caused by too much uric acid in your blood. This can occur because:  Your kidneys do not remove  enough uric acid from your blood. This is the most common cause.  Your body makes too much uric acid. This can occur with some cancers and  cancer treatments. It can also occur if your body is breaking down too many red blood cells (hemolytic anemia).  You eat too many foods that are high in purines. These foods include organ meats and some seafood. Alcohol, especially beer, is also high in purines.  A gout attack may be triggered by trauma or stress. What increases the risk? This condition is more likely to develop in people who:  Have a family history of gout.  Are male and middle-aged.  Are male and have gone through menopause.  Are obese.  Frequently drink alcohol, especially beer.  Are dehydrated.  Lose weight too quickly.  Have an organ transplant.  Have lead poisoning.  Take certain medicines, including aspirin, cyclosporine, diuretics, levodopa, and niacin.  Have kidney disease or psoriasis.  What are the signs or symptoms? An attack of acute gout happens quickly. It usually occurs in just one joint. The most common place is the big toe. Attacks often start at night. Other joints that may be affected include joints of the feet, ankle, knee, fingers, wrist, or elbow. Symptoms may include:  Severe pain.  Warmth.  Swelling.  Stiffness.  Tenderness. The affected joint may be very painful to touch.  Shiny, red, or purple skin.  Chills and fever.  Chronic gout may cause symptoms more frequently. More joints may be involved. You may also have white or yellow lumps (tophi) on your hands or feet or in other areas near your joints. How is this diagnosed? This condition is diagnosed based on your symptoms, medical history, and physical exam. You may have tests, such as:  Blood tests to measure uric acid levels.  Removal of joint fluid with a needle (aspiration) to look for uric acid crystals.  X-rays to look for joint damage.  How is this treated? Treatment for this condition has two phases: treating an acute attack and preventing future attacks. Acute gout treatment may include medicines to  reduce pain and swelling, including:  NSAIDs.  Steroids. These are strong anti-inflammatory medicines that can be taken by mouth (orally) or injected into a joint.  Colchicine. This medicine relieves pain and swelling when it is taken soon after an attack. It can be given orally or through an IV tube.  Preventive treatment may include:  Daily use of smaller doses of NSAIDs or colchicine.  Use of a medicine that reduces uric acid levels in your blood.  Changes to your diet. You may need to see a specialist about healthy eating (dietitian).  Follow these instructions at home: During a Gout Attack  If directed, apply ice to the affected area: ? Put ice in a plastic bag. ? Place a towel between your skin and the bag. ? Leave the ice on for 20 minutes, 2-3 times a day.  Rest the joint as much as possible. If the affected joint is in your leg, you may be given crutches to use.  Raise (elevate) the affected joint above the level of your heart as often as possible.  Drink enough fluids to keep your urine clear or pale yellow.  Take over-the-counter and prescription medicines only as told by your health care provider.  Do not drive or operate heavy machinery while taking prescription pain medicine.  Follow instructions from your health care provider about eating or drinking restrictions.  Return to your  normal activities as told by your health care provider. Ask your health care provider what activities are safe for you. Avoiding Future Gout Attacks  Follow a low-purine diet as told by your dietitian or health care provider. Avoid foods and drinks that are high in purines, including liver, kidney, anchovies, asparagus, herring, mushrooms, mussels, and beer.  Limit alcohol intake to no more than 1 drink a day for nonpregnant women and 2 drinks a day for men. One drink equals 12 oz of beer, 5 oz of wine, or 1 oz of hard liquor.  Maintain a healthy weight or lose weight if you are  overweight. If you want to lose weight, talk with your health care provider. It is important that you do not lose weight too quickly.  Start or maintain an exercise program as told by your health care provider.  Drink enough fluids to keep your urine clear or pale yellow.  Take over-the-counter and prescription medicines only as told by your health care provider.  Keep all follow-up visits as told by your health care provider. This is important. Contact a health care provider if:  You have another gout attack.  You continue to have symptoms of a gout attack after10 days of treatment.  You have side effects from your medicines.  You have chills or a fever.  You have burning pain when you urinate.  You have pain in your lower back or belly. Get help right away if:  You have severe or uncontrolled pain.  You cannot urinate. This information is not intended to replace advice given to you by your health care provider. Make sure you discuss any questions you have with your health care provider. Document Released: 07/27/2000 Document Revised: 01/05/2016 Document Reviewed: 05/12/2015 Elsevier Interactive Patient Education  2018 [employer].     IF you received an x-ray today, you will receive an invoice from [facility]. Please contact [facility] at [phone] with questions or concerns regarding your invoice.   IF you received labwork today, you will receive an invoice from [place]. Please contact [facility] at [phone] with questions or concerns regarding your invoice.   Our billing staff will not be able to assist you with questions regarding bills from these companies.  You will be contacted with the lab results as soon as they are available. The fastest way to get your results is to activate your My Chart account. Instructions are located on the last page of this paperwork. If you have not heard from [place] regarding the results in 2 weeks, please contact  this office.       I personally performed the services described in this documentation, which was scribed in my presence. The recorded information has been reviewed and considered for accuracy and completeness, addended by me as needed, and agree with information above.  Signed,   [name], MD [facility] [place].  12/30/17 10:37 PM

## 2017-12-30 NOTE — Patient Instructions (Addendum)
For gout - I would consider allopurinol once per day to lessen frequency of flairs. Cutting back on alcohol may be sufficient.  I will prescribe prednisone for next flair if needed.   No change in Truvada for now. I will check testing today, but return in 3 months for repeat testing. Fasting at that visit, and ideally early morning for accurate testosterone testing.   Thanks for coming in today.    Gout Gout is painful swelling that can occur in some of your joints. Gout is a type of arthritis. This condition is caused by having too much uric acid in your body. Uric acid is a chemical that forms when your body breaks down substances called purines. Purines are important for building body proteins. When your body has too much uric acid, sharp crystals can form and build up inside your joints. This causes pain and swelling. Gout attacks can happen quickly and be very painful (acute gout). Over time, the attacks can affect more joints and become more frequent (chronic gout). Gout can also cause uric acid to build up under your skin and inside your kidneys. What are the causes? This condition is caused by too much uric acid in your blood. This can occur because:  Your kidneys do not remove enough uric acid from your blood. This is the most common cause.  Your body makes too much uric acid. This can occur with some cancers and cancer treatments. It can also occur if your body is breaking down too many red blood cells (hemolytic anemia).  You eat too many foods that are high in purines. These foods include organ meats and some seafood. Alcohol, especially beer, is also high in purines.  A gout attack may be triggered by trauma or stress. What increases the risk? This condition is more likely to develop in people who:  Have a family history of gout.  Are male and middle-aged.  Are male and have gone through menopause.  Are obese.  Frequently drink alcohol, especially beer.  Are  dehydrated.  Lose weight too quickly.  Have an organ transplant.  Have lead poisoning.  Take certain medicines, including aspirin, cyclosporine, diuretics, levodopa, and niacin.  Have kidney disease or psoriasis.  What are the signs or symptoms? An attack of acute gout happens quickly. It usually occurs in just one joint. The most common place is the big toe. Attacks often start at night. Other joints that may be affected include joints of the feet, ankle, knee, fingers, wrist, or elbow. Symptoms may include:  Severe pain.  Warmth.  Swelling.  Stiffness.  Tenderness. The affected joint may be very painful to touch.  Shiny, red, or purple skin.  Chills and fever.  Chronic gout may cause symptoms more frequently. More joints may be involved. You may also have white or yellow lumps (tophi) on your hands or feet or in other areas near your joints. How is this diagnosed? This condition is diagnosed based on your symptoms, medical history, and physical exam. You may have tests, such as:  Blood tests to measure uric acid levels.  Removal of joint fluid with a needle (aspiration) to look for uric acid crystals.  X-rays to look for joint damage.  How is this treated? Treatment for this condition has two phases: treating an acute attack and preventing future attacks. Acute gout treatment may include medicines to reduce pain and swelling, including:  NSAIDs.  Steroids. These are strong anti-inflammatory medicines that can be taken by mouth (orally)  or injected into a joint.  Colchicine. This medicine relieves pain and swelling when it is taken soon after an attack. It can be given orally or through an IV tube.  Preventive treatment may include:  Daily use of smaller doses of NSAIDs or colchicine.  Use of a medicine that reduces uric acid levels in your blood.  Changes to your diet. You may need to see a specialist about healthy eating (dietitian).  Follow these  instructions at home: During a Gout Attack  If directed, apply ice to the affected area: ? Put ice in a plastic bag. ? Place a towel between your skin and the bag. ? Leave the ice on for 20 minutes, 2-3 times a day.  Rest the joint as much as possible. If the affected joint is in your leg, you may be given crutches to use.  Raise (elevate) the affected joint above the level of your heart as often as possible.  Drink enough fluids to keep your urine clear or pale yellow.  Take over-the-counter and prescription medicines only as told by your health care provider.  Do not drive or operate heavy machinery while taking prescription pain medicine.  Follow instructions from your health care provider about eating or drinking restrictions.  Return to your normal activities as told by your health care provider. Ask your health care provider what activities are safe for you. Avoiding Future Gout Attacks  Follow a low-purine diet as told by your dietitian or health care provider. Avoid foods and drinks that are high in purines, including liver, kidney, anchovies, asparagus, herring, mushrooms, mussels, and beer.  Limit alcohol intake to no more than 1 drink a day for nonpregnant women and 2 drinks a day for men. One drink equals 12 oz of beer, 5 oz of wine, or 1 oz of hard liquor.  Maintain a healthy weight or lose weight if you are overweight. If you want to lose weight, talk with your health care provider. It is important that you do not lose weight too quickly.  Start or maintain an exercise program as told by your health care provider.  Drink enough fluids to keep your urine clear or pale yellow.  Take over-the-counter and prescription medicines only as told by your health care provider.  Keep all follow-up visits as told by your health care provider. This is important. Contact a health care provider if:  You have another gout attack.  You continue to have symptoms of a gout attack  after10 days of treatment.  You have side effects from your medicines.  You have chills or a fever.  You have burning pain when you urinate.  You have pain in your lower back or belly. Get help right away if:  You have severe or uncontrolled pain.  You cannot urinate. This information is not intended to replace advice given to you by your health care provider. Make sure you discuss any questions you have with your health care provider. Document Released: 07/27/2000 Document Revised: 01/05/2016 Document Reviewed: 05/12/2015 Elsevier Interactive Patient Education  2018 [employer].     IF you received an x-ray today, you will receive an invoice from [facility]. Please contact [facility] at [phone] with questions or concerns regarding your invoice.   IF you received labwork today, you will receive an invoice from [place]. Please contact [facility] at [phone] with questions or concerns regarding your invoice.   Our billing staff will not be able to assist you with questions regarding bills  from these companies.  You will be contacted with the lab results as soon as they are available. The fastest way to get your results is to activate your My Chart account. Instructions are located on the last page of this paperwork. If you have not heard from [place] regarding the results in 2 weeks, please contact this office.

## 2017-12-31 LAB — HIV ANTIBODY (ROUTINE TESTING W REFLEX): HIV SCREEN 4TH GENERATION: NONREACTIVE

## 2017-12-31 LAB — BASIC METABOLIC PANEL
BUN / CREAT RATIO: 15 (ref 10–24)
BUN: 20 mg/dL (ref 8–27)
CO2: 25 mmol/L (ref 20–29)
Calcium: 9.3 mg/dL (ref 8.6–10.2)
Chloride: 100 mmol/L (ref 96–106)
Creatinine, Ser: 1.36 mg/dL — ABNORMAL HIGH (ref 0.76–1.27)
GFR calc Af Amer: 64 mL/min/{1.73_m2} (ref >59)
GFR calc non Af Amer: 56 mL/min/{1.73_m2} — ABNORMAL LOW (ref >59)
GLUCOSE: 80 mg/dL (ref 65–99)
POTASSIUM: 4.6 mmol/L (ref 3.5–5.2)
SODIUM: 141 mmol/L (ref 134–144)

## 2017-12-31 LAB — RPR: RPR Ser Ql: NONREACTIVE

## 2017-12-31 LAB — URIC ACID: Uric Acid: 8.6 mg/dL (ref 3.7–8.6)

## 2018-01-07 ENCOUNTER — Encounter: Payer: Self-pay | Admitting: Physician Assistant

## 2018-01-07 ENCOUNTER — Other Ambulatory Visit: Payer: Self-pay

## 2018-01-07 ENCOUNTER — Ambulatory Visit: Payer: BC Managed Care – PPO | Admitting: Physician Assistant

## 2018-01-07 VITALS — BP 110/72 | HR 60 | Temp 98.2°F | Resp 16 | Ht 67.5 in | Wt 178.6 lb

## 2018-01-07 DIAGNOSIS — J22 Unspecified acute lower respiratory infection: Secondary | ICD-10-CM

## 2018-01-07 DIAGNOSIS — R05 Cough: Secondary | ICD-10-CM

## 2018-01-07 DIAGNOSIS — R059 Cough, unspecified: Secondary | ICD-10-CM

## 2018-01-07 MED ORDER — HYDROCODONE-HOMATROPINE 5-1.5 MG/5ML PO SYRP
5.0000 mL | ORAL_SOLUTION | Freq: Three times a day (TID) | ORAL | 0 refills | Status: DC | PRN
Start: 2018-01-07 — End: 2018-01-29

## 2018-01-07 MED ORDER — AZITHROMYCIN 250 MG PO TABS: ORAL_TABLET | ORAL | 0 refills | Status: DC

## 2018-01-07 MED ORDER — PREDNISONE 20 MG PO TABS: ORAL_TABLET | ORAL | 0 refills | Status: DC

## 2018-01-07 NOTE — Progress Notes (Signed)
Jeremy Hendricks  MRN: [id number] DOB: [birth year]  PCP: Wendie Agreste, MD  Subjective:  Pt is a 62 year old male who presents to clinic for cough x 9 days.  Cough is worse at night when he lays down. Cough is not as bad during the day.  Endorses yellow mucus.   He has taken tessalon pearls - don't help. guafenasin makes it worse.  Mild occasional seasonal allergies.  Denies night sweats, fever, nausea, chills, vomiting, wheezing.     Pt  has a past medical history of Atrial fibrillation ([place]), Chronic diastolic CHF (congestive heart failure) ([place]), Coronary artery disease, DJD (degenerative joint disease), Gout, and History of ETT.  Review of Systems  Constitutional: Negative for chills, diaphoresis, fatigue and fever.  HENT: Negative for congestion, postnasal drip, rhinorrhea, sinus pressure, sinus pain, sneezing and sore throat.   Respiratory: Positive for cough. Negative for shortness of breath and wheezing.     Patient Active Problem List   Diagnosis Date Noted  . History of ETT   . Gout   . DJD (degenerative joint disease)   . Coronary artery disease   . Atrial fibrillation ([place])   . 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 HLD (hyperlipidemia) 06/11/2016  . Hypogonadism in male 09/09/2015  . INSOMNIA 09/19/2010  . SHORTNESS OF BREATH 09/19/2010  . CHEST PAIN-PRECORDIAL 08/23/2010  . CAD, ARTERY BYPASS GRAFT 10/26/2009  . HYPERLIPIDEMIA 10/24/2009  . DEGENERATIVE JOINT DISEASE 10/24/2009  . ANGINA, HX OF 10/24/2009    Current Outpatient Medications on File Prior to Visit  Medication Sig Dispense Refill  . ALPRAZolam (XANAX) 0.5 MG tablet Take 1 tablet (0.5 mg total) by mouth at bedtime as needed (alcohol withdrawal). 10 tablet 0  . aspirin EC 81 MG tablet Take 81 mg daily by mouth.    Marland Kitchen atenolol (TENORMIN) 25 MG tablet Take 1 tablet (25 mg total) by mouth daily. 90 tablet 3  . cetirizine (ZYRTEC) 10 MG tablet Take 1 tablet (10 mg total) by mouth daily. 30 tablet 11  .  emtricitabine-tenofovir (TRUVADA) 200-300 MG tablet Take 1 tablet by mouth daily. 90 tablet 1  . furosemide (LASIX) 40 MG tablet Take 1 tablet by mouth daily and 1/2 tablet by mouth daily as needed for swelling 135 tablet 3  . HYDROcodone-acetaminophen (NORCO/VICODIN) 5-325 MG tablet Take 1 tablet by mouth every 6 (six) hours as needed for moderate pain. 6 tablet 0  . Multiple Vitamin (MULTIVITAMIN) tablet Take 1 tablet by mouth daily.      . nitroGLYCERIN (NITROSTAT) 0.4 MG SL tablet Place 0.4 mg under the tongue every 5 (five) minutes as needed for chest pain. Reported on 09/21/2015    . predniSONE (DELTASONE) 20 MG tablet Take 2 tablets daily with breakfast. 12 tablet 0  . rosuvastatin (CRESTOR) 40 MG tablet Take 1 tablet (40 mg total) by mouth daily. 90 tablet 3  . Testosterone 30 MG/ACT SOLN PLACE 30MG  ONTO THE SKIN EVERY MORNING 90 mL 2   No current facility-administered medications on file prior to visit.     No Known Allergies   Objective:  BP 110/72 (BP Location: Left Arm, Patient Position: Sitting, Cuff Size: Normal)   Pulse 60   Temp 98.2 F (36.8 C) (Oral)   Resp 16   Ht 5' 7.5" (1.715 m)   Wt 178 lb 9.6 oz (81 kg)   SpO2 98%   BMI 27.56 kg/m   Physical Exam  Constitutional: He is oriented to person, place, and time. No  distress.  HENT:  Right Ear: Tympanic membrane normal.  Left Ear: Tympanic membrane normal.  Nose: Mucosal edema present. No rhinorrhea. Right sinus exhibits no maxillary sinus tenderness and no frontal sinus tenderness. Left sinus exhibits no maxillary sinus tenderness and no frontal sinus tenderness.  Mouth/Throat: Oropharynx is clear and moist and mucous membranes are normal.  Cardiovascular: Normal rate, regular rhythm and normal heart sounds.  Pulmonary/Chest: Effort normal and breath sounds normal. No respiratory distress. He has no wheezes. He has no rales.  Neurological: He is alert and oriented to person, place, and time.  Skin: Skin is warm and  dry.  Psychiatric: Judgment normal.  Vitals reviewed.   Assessment and Plan :  1. Lower respiratory infection 2. Cough -Patient complains of cough for the past week and a half.  Considering his past medical history plan to cover with antibiotics.  Return to clinic if no improvement after 5 to 7 days.  Mercer Pod, PA-C  [facility] 01/07/2018 12:06 PM

## 2018-01-07 NOTE — Patient Instructions (Addendum)
Start taking Prednisone '40mg'$  daily for 5 days.  Take the ENTIRE course of Azithromycin, even if you start to feel better sooner.  Hycodan is a hydrocodone-based cough syrup. Take this as needed at night to help you sleep. Take this as directed. Do not operate machinery while on this medicine. This will make you drowsy.   Come back if you are not improving in 7-10 days.   Stay well hydrated. Get lost of rest. Wash your hands often.   -Foods that can help speed recovery: honey, garlic, chicken soup, elderberries, green tea.  -Supplements that can help speed recovery: vitamin C, zinc, elderberry extract, quercetin, ginseng, selenium -Supplement with prebiotics and probiotics:   Advil or ibuprofen for pain. Do not take Aspirin.  Drink enough water and fluids to keep your urine clear or pale yellow.  For sore throat: ? Gargle with 8 oz of salt water ( tsp of salt per 1 qt of water) as often as every 1-2 hours to soothe your throat.  Gargle liquid benadryl.  Cepacol throat lozenges (if you are not at risk for choking).  For sore throat try using a honey-based tea. Use 3 teaspoons of honey with juice squeezed from half lemon. Place shaved pieces of ginger into 1/2-1 cup of water and warm over stove top. Then mix the ingredients and repeat every 4 hours as needed.  Cough Syrup Recipe: Sweet Lemon & Honey Thyme  Ingredients a handful of fresh thyme sprigs   1 pint of water (2 cups)  1/2 cup honey (raw is best, but regular will do)  1/2 lemon chopped Instructions 1. Place the lemon in the pint jar and cover with the honey. The honey will macerate the lemons and draw out liquids which taste so delicious! 2. Meanwhile, toss the thyme leaves into a saucepan and cover them with the water. 3. Bring the water to a gentle simmer and reduce it to half, about a cup of tea. 4. When the tea is reduced and cooled a bit, strain the sprigs & leaves, add it into the pint jar and stir it well. 5. Give it a  shake and use a spoonful as needed. 6. Store your homemade cough syrup in the refrigerator for about a month.  What causes a cough? In adults, common causes of a cough include: ?An infection of the airways or lungs (such as the common cold) ?Postnasal drip - Postnasal drip is when mucus from the nose drips down or flows along the back of the throat. Postnasal drip can happen when people have: .A cold .Allergies .A sinus infection - The sinuses are hollow areas in the bones of the face that open into the nose. ?Lung conditions, like asthma and chronic obstructive pulmonary disease (COPD) - Both of these conditions can make it hard to breathe. COPD is usually caused by smoking. ?Acid reflux - Acid reflux is when the acid that is normally in your stomach backs up into your esophagus (the tube that carries food from your mouth to your stomach). ?A side effect from blood pressure medicines called "ACE inhibitors" ?Smoking cigarettes  Is there anything I can do on my own to get rid of my cough? Yes. To help get rid of your cough, you can: ?Use a humidifier in your bedroom ?Use an over-the-counter cough medicine, or suck on cough drops or hard candy ?Stop smoking, if you smoke ?If you have allergies, avoid the things you are allergic to (like pollen, dust, animals, or mold) If  you have acid reflux, your doctor or nurse will tell you which lifestyle changes can help reduce symptoms.   Thank you for coming in today. I hope you feel we met your needs.  Feel free to call PCP if you have any questions or further requests.  Please consider signing up for MyChart if you do not already have it, as this is a great way to communicate with me.  Best,  Whitney McVey, PA-C   IF you received an x-ray today, you will receive an invoice from [facility]. Please contact [facility] at [phone] with questions or concerns regarding your invoice.   IF you received labwork today, you  will receive an invoice from [facility]. Please contact [facility] at [phone] with questions or concerns regarding your invoice.   Our billing staff will not be able to assist you with questions regarding bills from these companies.  You will be contacted with the lab results as soon as they are available. The fastest way to get your results is to activate your My Chart account. Instructions are located on the last page of this paperwork. If you have not heard from [place] regarding the results in 2 weeks, please contact this office.

## 2018-01-29 ENCOUNTER — Encounter: Payer: Self-pay | Admitting: Physician Assistant

## 2018-01-29 ENCOUNTER — Ambulatory Visit: Payer: BC Managed Care – PPO | Admitting: Physician Assistant

## 2018-01-29 VITALS — BP 134/90 | HR 60 | Temp 97.9°F | Resp 16 | Ht 67.0 in | Wt 183.4 lb

## 2018-01-29 DIAGNOSIS — M79644 Pain in right finger(s): Secondary | ICD-10-CM

## 2018-01-29 DIAGNOSIS — L03011 Cellulitis of right finger: Secondary | ICD-10-CM

## 2018-01-29 MED ORDER — MUPIROCIN 2 % EX OINT: 1.0000 "application " | TOPICAL_OINTMENT | Freq: Three times a day (TID) | CUTANEOUS | 1 refills | Status: DC

## 2018-01-29 MED ORDER — TRAMADOL HCL 50 MG PO TABS: 50.0000 mg | ORAL_TABLET | Freq: Three times a day (TID) | ORAL | 0 refills | Status: DC | PRN

## 2018-01-29 MED ORDER — DOXYCYCLINE HYCLATE 50 MG PO CAPS: 50.0000 mg | ORAL_CAPSULE | Freq: Two times a day (BID) | ORAL | 0 refills | Status: AC

## 2018-01-29 NOTE — Progress Notes (Signed)
Jeremy Hendricks  MRN: [id number] DOB: 1956-05-11  PCP: Wendie Agreste, MD  Subjective:  Pt is a 62 year old male who presents to clinic for finger pain. His right pointer finger was cut after cleaning a blender a few days ago.  Since that time he endorses pain, swelling, redness of his cuticle.  He has not done anything to make it better. Denies drainage, fever, chills, joint pain.  He has had paronychias in the past which have resolved on their own.  This one seems to be getting much worse than his previous ones  Review of Systems  Constitutional: Negative for chills and fever.  Musculoskeletal: Negative.   Skin: Positive for wound.    Patient Active Problem List   Diagnosis Date Noted  . History of ETT   . Gout   . DJD (degenerative joint disease)   . Coronary artery disease   . Atrial fibrillation ([place])   . 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 HLD (hyperlipidemia) 06/11/2016  . Hypogonadism in male 09/09/2015  . INSOMNIA 09/19/2010  . SHORTNESS OF BREATH 09/19/2010  . CHEST PAIN-PRECORDIAL 08/23/2010  . CAD, ARTERY BYPASS GRAFT 10/26/2009  . HYPERLIPIDEMIA 10/24/2009  . DEGENERATIVE JOINT DISEASE 10/24/2009  . ANGINA, HX OF 10/24/2009    Current Outpatient Medications on File Prior to Visit  Medication Sig Dispense Refill  . ALPRAZolam (XANAX) 0.5 MG tablet Take 1 tablet (0.5 mg total) by mouth at bedtime as needed (alcohol withdrawal). 10 tablet 0  . aspirin EC 81 MG tablet Take 81 mg daily by mouth.    Marland Kitchen atenolol (TENORMIN) 25 MG tablet Take 1 tablet (25 mg total) by mouth daily. 90 tablet 3  . emtricitabine-tenofovir (TRUVADA) 200-300 MG tablet Take 1 tablet by mouth daily. 90 tablet 1  . furosemide (LASIX) 40 MG tablet Take 1 tablet by mouth daily and 1/2 tablet by mouth daily as needed for swelling 135 tablet 3  . HYDROcodone-acetaminophen (NORCO/VICODIN) 5-325 MG tablet Take 1 tablet by mouth every 6 (six) hours as needed for moderate pain. 6 tablet 0  . Multiple Vitamin  (MULTIVITAMIN) tablet Take 1 tablet by mouth daily.      . nitroGLYCERIN (NITROSTAT) 0.4 MG SL tablet Place 0.4 mg under the tongue every 5 (five) minutes as needed for chest pain. Reported on 09/21/2015    . predniSONE (DELTASONE) 20 MG tablet Take 2 tablets daily with breakfast. 10 tablet 0  . rosuvastatin (CRESTOR) 40 MG tablet Take 1 tablet (40 mg total) by mouth daily. 90 tablet 3  . Testosterone 30 MG/ACT SOLN PLACE 30MG  ONTO THE SKIN EVERY MORNING 90 mL 2  . cetirizine (ZYRTEC) 10 MG tablet Take 1 tablet (10 mg total) by mouth daily. (Patient not taking: Reported on 01/29/2018) 30 tablet 11   No current facility-administered medications on file prior to visit.     No Known Allergies   Objective:  BP 134/90 (BP Location: Left Arm, Patient Position: Sitting, Cuff Size: Normal)   Pulse 60   Temp 97.9 F (36.6 C) (Oral)   Resp 16   Ht 5\' 7"  (1.702 m)   Wt 183 lb 6.4 oz (83.2 kg)   SpO2 99%   BMI 28.72 kg/m   Physical Exam  Constitutional: He is oriented to person, place, and time. He appears well-developed and well-nourished.  Neurological: He is alert and oriented to person, place, and time.  Skin: Skin is warm and dry.     Psychiatric: He has a normal mood and  affect. His behavior is normal. Judgment and thought content normal.  Vitals reviewed.  Procedure: Verbal consent obtained. Skin was cleaned with betadine and anesthetized with cold spray. A <1cm cm incision was made. A moderate amount of purulent material expressed. Wound culture taken. Wound was not packed. Wound dressed and wound care discussed.  Assessment and Plan :  1. Paronychia of finger of right hand -Patient presents with worsening pain and swelling of a paronychia on the second digit of his right hand.  Incision and drainage performed successfully with a moderate amount of drainage.  Wound culture taken.  Wound dressed. Advised warm soaks and application of Bactroban ointment.  Come back if symptoms worsen. -  doxycycline (VIBRAMYCIN) 50 MG capsule; Take 1 capsule (50 mg total) by mouth 2 (two) times daily for 5 days.  Dispense: 10 capsule; Refill: 0 - mupirocin ointment (BACTROBAN) 2 %; Apply 1 application topically 3 (three) times daily.  Dispense: 22 g; Refill: 1 - WOUND CULTURE  2. Pain of finger of right hand - traMADol (ULTRAM) 50 MG tablet; Take 1 tablet (50 mg total) by mouth every 8 (eight) hours as needed.  Dispense: 9 tablet; Refill: 0    Mercer Pod, PA-C  [facility] [place] 01/29/2018 6:23 PM

## 2018-01-29 NOTE — Patient Instructions (Addendum)
- Soak the affected area in warm water. Do this for 20 minutes, 2-3 times a day. Keep the area dry in between soakings. Apply mupirocin ointment after each warm soak. - take doxycycline twice daily x 5 days.   Come back if you are not improving   Paronychia Paronychia is an infection of the skin that surrounds a nail. It usually affects the skin around a fingernail, but it may also occur near a toenail. It often causes pain and swelling around the nail. This condition may come on suddenly or develop over a longer period. In some cases, a collection of pus (abscess) can form near or under the nail. Usually, paronychia is not serious and it clears up with treatment. What are the causes? This condition may be caused by bacteria or fungi. It is commonly caused by either Streptococcus or Staphylococcus bacteria. The bacteria or fungi often cause the infection by getting into the affected area through an opening in the skin, such as a cut or a hangnail. What increases the risk? This condition is more likely to develop in:  People who get their hands wet often, such as those who work as Designer, industrial/product, bartenders, or nurses.  People who bite their fingernails or suck their thumbs.  People who trim their nails too short.  People who have hangnails or injured fingertips.  People who get manicures.  People who have diabetes.  What are the signs or symptoms? Symptoms of this condition include:  Redness and swelling of the skin near the nail.  Tenderness around the nail when you touch the area.  Pus-filled bumps under the cuticle. The cuticle is the skin at the base or sides of the nail.  Fluid or pus under the nail.  Throbbing pain in the area.  How is this diagnosed? This condition is usually diagnosed with a physical exam. In some cases, a sample of pus may be taken from an abscess to be tested in a lab. This can help to determine what type of bacteria or fungi is causing the  condition. How is this treated? Treatment for this condition depends on the cause and severity of the condition. If the condition is mild, it may clear up on its own in a few days. Your health care provider may recommend soaking the affected area in warm water a few times a day. When treatment is needed, the options may include:  Antibiotic medicine, if the condition is caused by a bacterial infection.  Antifungal medicine, if the condition is caused by a fungal infection.  Incision and drainage, if an abscess is present. In this procedure, the health care provider will cut open the abscess so the pus can drain out.  Follow these instructions at home:  Soak the affected area in warm water if directed to do so by your health care provider. You may be told to do this for 20 minutes, 2-3 times a day. Keep the area dry in between soakings.  Take medicines only as directed by your health care provider.  If you were prescribed an antibiotic medicine, finish all of it even if you start to feel better.  Keep the affected area clean.  Do not try to drain a fluid-filled bump yourself.  If you will be washing dishes or performing other tasks that require your hands to get wet, wear rubber gloves. You should also wear gloves if your hands might come in contact with irritating substances, such as cleaners or chemicals.  Follow your health  care provider's instructions about: ? Wound care. ? Bandage (dressing) changes and removal. Contact a health care provider if:  Your symptoms get worse or do not improve with treatment.  You have a fever or chills.  You have redness spreading from the affected area.  You have continued or increased fluid, blood, or pus coming from the affected area.  Your finger or knuckle becomes swollen or is difficult to move. This information is not intended to replace advice given to you by your health care provider. Make sure you discuss any questions you have with  your health care provider. Document Released: 01/23/2001 Document Revised: 01/05/2016 Document Reviewed: 07/07/2014 Elsevier Interactive Patient Education  2018 [employer].  IF you received an x-ray today, you will receive an invoice from [facility]. Please contact [facility] at [phone] with questions or concerns regarding your invoice.   IF you received labwork today, you will receive an invoice from [place]. Please contact [facility] at [phone] with questions or concerns regarding your invoice.   Our billing staff will not be able to assist you with questions regarding bills from these companies.  You will be contacted with the lab results as soon as they are available. The fastest way to get your results is to activate your My Chart account. Instructions are located on the last page of this paperwork. If you have not heard from [place] regarding the results in 2 weeks, please contact this office.

## 2018-02-03 LAB — WOUND CULTURE: Organism ID, Bacteria: NONE SEEN

## 2018-03-11 ENCOUNTER — Other Ambulatory Visit: Payer: Self-pay | Admitting: Physician Assistant

## 2018-03-11 ENCOUNTER — Encounter: Payer: Self-pay | Admitting: Physician Assistant

## 2018-03-11 DIAGNOSIS — R0981 Nasal congestion: Secondary | ICD-10-CM

## 2018-03-11 NOTE — Progress Notes (Signed)
Cardiology Office Note    Date:  03/13/2018  ID:  Jeremy Hendricks, DOB [birth year], MRN [id number] PCP:  Wendie Agreste, MD  Cardiologist:  Lauree Chandler, MD   Chief Complaint: routine f/u CAD  History of Present Illness:  Jeremy Hendricks is a 62 y.o. male with history of CAD s/p CABG in 2002 and redo in January 2012, post-op afib, chronic diastolic CHF, DDD, hypogonadism, possible mild CKD who is here today for routine cardiac follow up.  His original anginal sx in 2002 was a sensation of something heavy on his back. He saw Dr. Verl Blalock in January 2012 with chest discomfort and was noted to have an abnormal Myoview. Cath had demonstrated severe graft disease and native three-vessel CAD. He was referred for redo bypass. His LIMA to LAD remained patent. His grafts at his surgery on 09/01/10 with Dr. Roxan Hockey included a left radial-PDA and a free RIMA-ramus intermedius. Postoperatively he developed atrial fibrillation with rapid ventricular rate and was placed on amiodarone. He has maintained sinus since then and his amiodarone was stopped. Echo 06/15/13 showed mild LVH, EF 60-65%, no valve disease. ETT 01/2015 was normal. Last labs: 12/2017 Na 141, K 4.6, Cr 1.36, elevated uric acid, 09/2017 normal ESR, CRP, Hgb 15.0, plt 262, 08/2017 CK, BNP, TSH, Mg WNL, 08/2017 LDL 42 (done by primary care).  He returns for follow-up feeling well. Hasn't been exercising as much as he'd like, but still doing cardio 3x a week without angina. He quit drinking a few months ago altogether when in the throes of gout flareup. Between cessation of EtOH and prednisone, the gout cleared completely. He takes Lasix '40mg'$  daily regularly now - can tell when he misses it. It really improves his general sense of wellbeing.   Past Medical History:  Diagnosis Date  . Atrial fibrillation ([place])    postoperative  . Chronic diastolic CHF (congestive heart failure) ([place])   . CKD (chronic kidney disease), stage II   . Coronary  artery disease    a. s/p CABG 2002. b. s/p redo 2012.  Marland Kitchen DJD (degenerative joint disease)   . Gout   . History of ETT    a. ETT 6/16:  normal    Past Surgical History:  Procedure Laterality Date  . CORONARY ARTERY BYPASS GRAFT  2002  . CORONARY ARTERY BYPASS GRAFT  08-2010   L-LAD remained from original CABG; new grafts incl L radial- PDA + RIMA-RI  . VASECTOMY      Current Medications: Current Meds  Medication Sig  . aspirin EC 81 MG tablet Take 81 mg daily by mouth.  Marland Kitchen atenolol (TENORMIN) 25 MG tablet Take 1 tablet (25 mg total) by mouth daily.  Marland Kitchen emtricitabine-tenofovir (TRUVADA) 200-300 MG tablet Take 1 tablet by mouth daily.  . furosemide (LASIX) 40 MG tablet Take 1 tablet by mouth daily and 1/2 tablet by mouth daily as needed for swelling  . Multiple Vitamin (MULTIVITAMIN) tablet Take 1 tablet by mouth daily.    . nitroGLYCERIN (NITROSTAT) 0.4 MG SL tablet Place 0.4 mg under the tongue every 5 (five) minutes as needed for chest pain. Reported on 09/21/2015  . predniSONE (DELTASONE) 20 MG tablet Take 20 mg by mouth daily as needed (GOUT).  . rosuvastatin (CRESTOR) 40 MG tablet Take 1 tablet (40 mg total) by mouth daily.  . Testosterone 30 MG/ACT SOLN PLACE '30MG'$  ONTO THE SKIN EVERY MORNING  . [DISCONTINUED] predniSONE (DELTASONE) 20 MG tablet Take 2 tablets daily with breakfast.  Allergies:   Patient has no known allergies.   Social History   Socioeconomic History  . Marital status: Single    Spouse name: Not on file  . Number of children: 2  . Years of education: Not on file  . Highest education level: Not on file  Occupational History  . Occupation: Cabin crew  Social Needs  . Financial resource strain: Not on file  . Food insecurity:    Worry: Not on file    Inability: Not on file  . Transportation needs:    Medical: Not on file    Non-medical: Not on file  Tobacco Use  . Smoking status: Former Smoker    Last attempt to quit: 08/14/1999    Years since  quitting: 18.5  . Smokeless tobacco: Never Used  Substance and Sexual Activity  . Alcohol use: Yes    Alcohol/week: 8.4 oz    Types: 14 Standard drinks or equivalent per week  . Drug use: No  . Sexual activity: Yes  Lifestyle  . Physical activity:    Days per week: Not on file    Minutes per session: Not on file  . Stress: Not on file  Relationships  . Social connections:    Talks on phone: Not on file    Gets together: Not on file    Attends religious service: Not on file    Active member of club or organization: Not on file    Attends meetings of clubs or organizations: Not on file    Relationship status: Not on file  Other Topics Concern  . Not on file  Social History Narrative   Single   Education: College   Exercise: Yes     Family History:  The patient's family history includes Cancer in his mother and unknown relative; Coronary artery disease in his unknown relative; Heart attack (age of onset: 27) in his father; Hyperlipidemia in his unknown relative; Hypertension in his father and paternal grandfather. There is no history of Stroke or Colon cancer.  ROS:   Please see the history of present illness. Otherwise, review of systems is positive for +hearing loss (hearing aid). Does notice LLE tends to swell more than RLE ever since redo surgery/harvest on that leg. All other systems are reviewed and otherwise negative.    PHYSICAL EXAM:   VS:  BP 110/74   Pulse (!) 53   Ht '5\' 7"'$  (1.702 m)   Wt 179 lb 12.8 oz (81.6 kg)   SpO2 99%   BMI 28.16 kg/m   BMI: Body mass index is 28.16 kg/m. GEN: Well nourished, well developed WM, in no acute distress HEENT: normocephalic, atraumatic Neck: no JVD, carotid bruits, or masses Cardiac: RRR; no murmurs, rubs, or gallops, no significant edema appreciated on exam today Respiratory:  clear to auscultation bilaterally, normal work of breathing GI: soft, nontender, nondistended, + BS MS: no deformity or atrophy Skin: warm and  dry, no rash Neuro:  Alert and Oriented x 3, Strength and sensation are intact, follows commands Psych: euthymic mood, full affect  Wt Readings from Last 3 Encounters:  03/13/18 179 lb 12.8 oz (81.6 kg)  01/29/18 183 lb 6.4 oz (83.2 kg)  01/07/18 178 lb 9.6 oz (81 kg)      Studies/Labs Reviewed:   EKG:  EKG was ordered today and personally reviewed by me and demonstrates sinus bradycardia 53bpm with first degree AVB, TWI V2 (suspect near lead V1). No acute changes.  Recent Labs: 08/15/2017: ALT 25 08/20/2017:  Magnesium 2.2; NT-Pro BNP 169; TSH 2.910 10/10/2017: Hemoglobin 15.0; Platelets 262 12/30/2017: BUN 20; Creatinine, Ser 1.36; Potassium 4.6; Sodium 141   Lipid Panel    Component Value Date/Time   CHOL 91 (L) 08/15/2017 1030   TRIG 97 08/15/2017 1030   HDL 30 (L) 08/15/2017 1030   CHOLHDL 3.0 08/15/2017 1030   CHOLHDL 3.7 02/03/2016 0839   VLDL 21 02/03/2016 0839   LDLCALC 42 08/15/2017 1030    Additional studies/ records that were reviewed today include: Summarized above.   ASSESSMENT & PLAN:   1. CAD - doing well without recurrent angina. HR is in the 50s but stable on atenolol without symptoms. Continue current regimen. Lipid profile with favorable LDL 08/2017. He inquired about when we would repeat stress testing. Per UpToDate, "In the absence of new or changing symptoms <5 years after coronary artery bypass graft (CABG) or <2 years after percutaneous coronary intervention (PCI), stress testing is rarely appropriate. However, for patients ?5 years after CABG or ?2 years after PCI, stress testing may be appropriate on a one-time basis for the asymptomatic patient [7]." He had a normal stress test in 2016. Given lack of recurrent angina, no indication for stress testing at present time but patient encouraged to monitor very carefully for any new sx. He does appear to be doing everything he can to prevent progression of disease at this time. 2. Chronic diastolic CHF - appears  euvolemic. Continue Lasix at present dose. 3. Post-op atrial fibrillation - maintaining NSR/sinus bradycardia without recurrence. No palps. 4. 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 He does report the 1.36 was when he was taking a lot of NSAIDS for gout. He is curious to know where it has settled with cessation of this plus alcohol. Recheck today.  Disposition: F/u with Dr. Angelena Form in 6 months.   Medication Adjustments/Labs and Tests Ordered: Current medicines are reviewed at length with the patient today.  Concerns regarding medicines are outlined above. Medication changes, Labs and Tests ordered today are summarized above and listed in the Patient Instructions accessible in Encounters.   Signed, Charlie Pitter, PA-C  03/13/2018 9:14 AM    [address], [place], [place]  [address] Phone: [phone]; Fax: [phone]

## 2018-03-12 NOTE — Telephone Encounter (Signed)
fluticasone refill Last Refill:06/11/16 # 16g 6 RF Last OV: dc'd 02/11/17 PCP: Dr Carlota Raspberry [facility]

## 2018-03-13 ENCOUNTER — Encounter: Payer: Self-pay | Admitting: Physician Assistant

## 2018-03-13 ENCOUNTER — Ambulatory Visit (INDEPENDENT_AMBULATORY_CARE_PROVIDER_SITE_OTHER): Payer: BC Managed Care – PPO | Admitting: Physician Assistant

## 2018-03-13 VITALS — BP 110/74 | HR 53 | Ht 67.0 in | Wt 179.8 lb

## 2018-03-13 DIAGNOSIS — Z951 Presence of aortocoronary bypass graft: Secondary | ICD-10-CM

## 2018-03-13 DIAGNOSIS — I9789 Other postprocedural complications and disorders of the circulatory system, not elsewhere classified: Secondary | ICD-10-CM | POA: Diagnosis not present

## 2018-03-13 DIAGNOSIS — I2510 Atherosclerotic heart disease of native coronary artery without angina pectoris: Secondary | ICD-10-CM | POA: Diagnosis not present

## 2018-03-13 DIAGNOSIS — N289 Disorder of kidney and ureter, unspecified: Secondary | ICD-10-CM

## 2018-03-13 DIAGNOSIS — I5032 Chronic diastolic (congestive) heart failure: Secondary | ICD-10-CM | POA: Diagnosis not present

## 2018-03-13 DIAGNOSIS — I4891 Unspecified atrial fibrillation: Secondary | ICD-10-CM

## 2018-03-13 LAB — BASIC METABOLIC PANEL
BUN/Creatinine Ratio: 17 (ref 10–24)
BUN: 21 mg/dL (ref 8–27)
CALCIUM: 9.6 mg/dL (ref 8.6–10.2)
CO2: 26 mmol/L (ref 20–29)
Chloride: 97 mmol/L (ref 96–106)
Creatinine, Ser: 1.23 mg/dL (ref 0.76–1.27)
GFR calc Af Amer: 73 mL/min/{1.73_m2} (ref >59)
GFR, EST NON AFRICAN AMERICAN: 63 mL/min/{1.73_m2} (ref >59)
Glucose: 87 mg/dL (ref 65–99)
Potassium: 4.3 mmol/L (ref 3.5–5.2)
Sodium: 139 mmol/L (ref 134–144)

## 2018-03-13 NOTE — Patient Instructions (Addendum)
Medication Instructions:  Your physician recommends that you continue on your current medications as directed. Please refer to the Current Medication list given to you today.   Labwork: TODAY:  BMET  Testing/Procedures: None ordered  Follow-Up: Your physician wants you to follow-up in:  [place] DR. Angelena Form  You will receive a reminder letter in the mail two months in advance. If you don't receive a letter, please call our office to schedule the follow-up appointment.    Any Other Special Instructions Will Be Listed Below (If Applicable)'  Patients taking blood thinners should generally stay away from medicines like ibuprofen, Advil, Motrin, naproxen, and Aleve due to risk of stomach bleeding. You may take Tylenol as directed or talk to your primary doctor about alternatives.   If you need a refill on your cardiac medications before your next appointment, please call your pharmacy.

## 2018-03-13 NOTE — Addendum Note (Signed)
Addended by: Gaetano Net on: 03/13/2018 09:37 AM   Modules accepted: Orders

## 2018-03-21 ENCOUNTER — Other Ambulatory Visit: Payer: Self-pay | Admitting: Family Medicine

## 2018-03-21 DIAGNOSIS — Z113 Encounter for screening for infections with a predominantly sexual mode of transmission: Secondary | ICD-10-CM

## 2018-03-21 NOTE — Telephone Encounter (Signed)
Refill for emtricitabine-tenofovir 200-300 mg tab.  LOV  12/30/17  No 3 month follow up appointment scheduled.   Dr. [facility]  [facility]

## 2018-03-29 ENCOUNTER — Other Ambulatory Visit: Payer: Self-pay | Admitting: Family Medicine

## 2018-03-29 DIAGNOSIS — Z113 Encounter for screening for infections with a predominantly sexual mode of transmission: Secondary | ICD-10-CM

## 2018-03-31 NOTE — Telephone Encounter (Signed)
Rx refill request: Truvada 200-300       Last filled: 10/03/17  LOV: 12/30/17  PCP: [place] is requesting diagnosis code for medication

## 2018-04-02 ENCOUNTER — Encounter: Payer: Self-pay | Admitting: Family Medicine

## 2018-04-03 ENCOUNTER — Telehealth: Payer: Self-pay | Admitting: Family Medicine

## 2018-04-03 DIAGNOSIS — Z113 Encounter for screening for infections with a predominantly sexual mode of transmission: Secondary | ICD-10-CM

## 2018-04-03 MED ORDER — EMTRICITABINE-TENOFOVIR DF 200-300 MG PO TABS: 1.0000 | ORAL_TABLET | Freq: Every day | ORAL | 1 refills | Status: DC

## 2018-04-03 NOTE — Telephone Encounter (Signed)
Copied from [place] [id number]. Topic: Quick Communication - Rx Refill/Question >> Apr 03, 2018 10:32 AM Neva Seat wrote: emtricitabine-tenofovir (TRUVADA) 200-300 MG tablet  Pt is almost out of his medication.  Needing enough to last until his upcoming appt on 04-15-18 w/ Dr. Carlota Raspberry.  [id number] [place] [place] [facility] [address]

## 2018-04-03 NOTE — Telephone Encounter (Signed)
Labs in May. Refilled.

## 2018-04-03 NOTE — Telephone Encounter (Signed)
Dr Carlota Raspberry advise please. Dgaddy, CMA

## 2018-04-03 NOTE — Telephone Encounter (Signed)
Refill of Truvada  LRF 10/03/17  #90  1 refill  LOV 01/29/18 [place]  PCP Dr. Carlota Raspberry    Pt is almost out of his medication. Requesting RX to last until his upcoming appt on 04-15-18 w/ Dr. Carlota Raspberry.  [id number] [place] [place] [facility] [address]

## 2018-04-09 NOTE — Telephone Encounter (Signed)
Pt called back inquiring about the status of the refill. Pt stated that he uses the pharmacy below and not the one it was sent to. Please advise.   [id number] [place] [place] [address] [phone] (Phone) [phone] (Fax)

## 2018-04-10 ENCOUNTER — Other Ambulatory Visit: Payer: Self-pay

## 2018-04-10 DIAGNOSIS — Z113 Encounter for screening for infections with a predominantly sexual mode of transmission: Secondary | ICD-10-CM

## 2018-04-10 MED ORDER — EMTRICITABINE-TENOFOVIR DF 200-300 MG PO TABS: 1.0000 | ORAL_TABLET | Freq: Every day | ORAL | 1 refills | Status: DC

## 2018-04-10 NOTE — Telephone Encounter (Signed)
Reordered at pharmacy pt req - [facility] [place]

## 2018-04-15 ENCOUNTER — Ambulatory Visit: Payer: BC Managed Care – PPO | Admitting: Family Medicine

## 2018-04-15 ENCOUNTER — Encounter: Payer: Self-pay | Admitting: Family Medicine

## 2018-04-15 ENCOUNTER — Other Ambulatory Visit: Payer: Self-pay

## 2018-04-15 VITALS — BP 109/67 | HR 59 | Temp 98.1°F | Resp 18 | Ht 67.0 in | Wt 179.2 lb

## 2018-04-15 DIAGNOSIS — Z113 Encounter for screening for infections with a predominantly sexual mode of transmission: Secondary | ICD-10-CM | POA: Diagnosis not present

## 2018-04-15 DIAGNOSIS — Z23 Encounter for immunization: Secondary | ICD-10-CM

## 2018-04-15 DIAGNOSIS — E291 Testicular hypofunction: Secondary | ICD-10-CM

## 2018-04-15 MED ORDER — EMTRICITABINE-TENOFOVIR DF 200-300 MG PO TABS: 1.0000 | ORAL_TABLET | Freq: Every day | ORAL | 0 refills | Status: DC

## 2018-04-15 NOTE — Patient Instructions (Addendum)
   No change in meds today. Condoms recommended.  Repeat STI testing in 3 months. Recheck testosterone labs in 6 months.  If reading today is low, may be due to time of day and can recheck reading between 8 and 10am with lab only visit.   Thanks for coming in today.   If you have lab work done today you will be contacted with your lab results within the next 2 weeks.  If you have not heard from [place] then please contact us. The fastest way to get your results is to register for My Chart.   IF you received an x-ray today, you will receive an invoice from [facility] Rad[facility]. Please contact [facility] Radiology at [phone] with questions or concerns regarding your invoice.   IF you received labwork today, you will receive an invoice from [facility]. Please contact [facility] at [phone] with questions or concerns regarding your invoice.   Our billing staff will not be able to assist you with questions regarding bills from these companies.  You will be contacted with the lab results as soon as they are available. The fastest way to get your results is to activate your My Chart account. Instructions are located on the last page of this paperwork. If you have not heard from [place] regarding the results in 2 weeks, please contact this office.

## 2018-04-15 NOTE — Progress Notes (Signed)
Subjective:    Patient ID: Jeremy Hendricks, male    DOB: 19-Jun-1956, 62 y.o.   MRN: [id number]  HPI [name] is a 62 y.o. male Presents today for: Chief Complaint  Patient presents with  . Medication Management    pt just needs to follow-up for Truvada medication    Preexposure prophylaxis: Currently on Truvada.  Renal function normal August 1 after slight elevation in May up to 1.36. Lab Results  Component Value Date   CREATININE 1.23 03/13/2018  HIV nonreactive, RPR nonreactive, negative GC/chlamydia testing in May. Taking QD. Unprotected intercourse - 2-3 new partners since May. No condoms d/t sensitivity issues.   Hypogonadism: On testosterone 38mc/act soln - 1 pump per day. Feels like current dose is working.  No changes desired at this time. No skin irritation or side effects.  testosterone in January 818.  PSA 1.6 in January   Patient Active Problem List   Diagnosis Date Noted  . History of ETT   . Gout   . DJD (degenerative joint disease)   . Coronary artery disease   . Atrial fibrillation ([place])   . 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 HLD (hyperlipidemia) 06/11/2016  . Hypogonadism in male 09/09/2015  . INSOMNIA 09/19/2010  . SHORTNESS OF BREATH 09/19/2010  . CHEST PAIN-PRECORDIAL 08/23/2010  . CAD, ARTERY BYPASS GRAFT 10/26/2009  . HYPERLIPIDEMIA 10/24/2009  . DEGENERATIVE JOINT DISEASE 10/24/2009  . ANGINA, HX OF 10/24/2009   Past Medical History:  Diagnosis Date  . Atrial fibrillation ([place])    postoperative  . Chronic diastolic CHF (congestive heart failure) ([place])   . CKD (chronic kidney disease), stage II   . Coronary artery disease    a. s/p CABG 2002. b. s/p redo 2012.  Marland Kitchen DJD (degenerative joint disease)   . Gout   . History of ETT    a. ETT 6/16:  normal   Past Surgical History:  Procedure Laterality Date  . CORONARY ARTERY BYPASS GRAFT  2002  . CORONARY ARTERY BYPASS GRAFT  08-2010   L-LAD remained from original CABG; new grafts incl L  radial- PDA + RIMA-RI  . VASECTOMY     No Known Allergies Prior to Admission medications   Medication Sig Start Date End Date Taking? Authorizing Provider  aspirin EC 81 MG tablet Take 81 mg daily by mouth.   Yes [provider]  atenolol (TENORMIN) 25 MG tablet Take 1 tablet (25 mg total) by mouth daily. 08/20/17  Yes Dunn, Dayna N, PA-C  emtricitabine-tenofovir (TRUVADA) 200-300 MG tablet Take 1 tablet by mouth daily. 04/10/18  Yes Wendie Agreste, MD  furosemide (LASIX) 40 MG tablet Take 40 mg by mouth.   Yes [provider]  Multiple Vitamin (MULTIVITAMIN) tablet Take 1 tablet by mouth daily.     Yes [provider]  nitroGLYCERIN (NITROSTAT) 0.4 MG SL tablet Place 0.4 mg under the tongue every 5 (five) minutes as needed for chest pain. Reported on 09/21/2015   Yes [provider]  predniSONE (DELTASONE) 20 MG tablet Take 20 mg by mouth daily as needed (GOUT).   Yes [provider]  rosuvastatin (CRESTOR) 40 MG tablet Take 1 tablet (40 mg total) by mouth daily. 08/20/17  Yes Dunn, Nedra Hai, PA-C  Testosterone 30 MG/ACT SOLN PLACE 30MG  ONTO THE SKIN EVERY MORNING 12/20/17  Yes Wendie Agreste, MD   Social History   Socioeconomic History  . Marital status: Single    Spouse name: Not on file  .  Number of children: 2  . Years of education: Not on file  . Highest education level: Not on file  Occupational History  . Occupation: Cabin crew  Social Needs  . Financial resource strain: Not on file  . Food insecurity:    Worry: Not on file    Inability: Not on file  . Transportation needs:    Medical: Not on file    Non-medical: Not on file  Tobacco Use  . Smoking status: Former Smoker    Last attempt to quit: 08/14/1999    Years since quitting: 18.6  . Smokeless tobacco: Never Used  Substance and Sexual Activity  . Alcohol use: Yes    Alcohol/week: 14.0 standard drinks    Types: 14 Standard drinks or equivalent per week  . Drug use: No  .  Sexual activity: Yes  Lifestyle  . Physical activity:    Days per week: Not on file    Minutes per session: Not on file  . Stress: Not on file  Relationships  . Social connections:    Talks on phone: Not on file    Gets together: Not on file    Attends religious service: Not on file    Active member of club or organization: Not on file    Attends meetings of clubs or organizations: Not on file    Relationship status: Not on file  . Intimate partner violence:    Fear of current or ex partner: Not on file    Emotionally abused: Not on file    Physically abused: Not on file    Forced sexual activity: Not on file  Other Topics Concern  . Not on file  Social History Narrative   Single   Education: College   Exercise: Yes    Review of Systems  Constitutional: Negative for chills, fever and unexpected weight change.  Genitourinary: Negative for difficulty urinating, discharge, dysuria, frequency, genital sores and testicular pain.      Objective:   Physical Exam  Constitutional: He is oriented to person, place, and time. He appears well-developed and well-nourished.  HENT:  Head: Normocephalic and atraumatic.  Eyes: Pupils are equal, round, and reactive to light. EOM are normal.  Neck: Carotid bruit is not present.  Cardiovascular: Normal rate, regular rhythm and normal heart sounds.  No murmur heard. Pulmonary/Chest: Effort normal and breath sounds normal. He has no rales.  Musculoskeletal: He exhibits no edema.  Neurological: He is alert and oriented to person, place, and time.  Skin: Skin is warm and dry.  Psychiatric: He has a normal mood and affect.  Vitals reviewed.   Vitals:   04/15/18 1344  BP: 109/67  Pulse: (!) 59  Resp: 18  Temp: 98.1 F (36.7 C)  TempSrc: Oral  SpO2: 98%  Weight: 179 lb 3.2 oz (81.3 kg)  Height: 5\' 7"  (1.702 m)       Assessment & Plan:   [name] is a 62 y.o. male Hypogonadism male - Plan: PSA, Testosterone  - tolerating  current regimen. Check PSA, testosterone level, although that may be slightly low due to time of day.  If low can repeat between 8 and 10 in the morning as a lab only visit.  Need for prophylactic vaccination and inoculation against influenza - Plan: Flu Vaccine QUAD 36+ mos IM  Routine screening for STI (sexually transmitted infection) - Plan: HIV antibody, RPR, GC/Chlamydia Probe Amp, emtricitabine-tenofovir (TRUVADA) 200-300 MG tablet  -Preexposure prophylaxis with Truvada.  Safer sex practices  were discussed including use of condoms and techniques discussed to increase compliance with condom use.  -STI testing as above.  Recheck in 3 months.  Additional Truvada prescription provided to make sure he does not run out prior to future visit. Meds ordered this encounter  Medications  . emtricitabine-tenofovir (TRUVADA) 200-300 MG tablet    Sig: Take 1 tablet by mouth daily.    Dispense:  90 tablet    Refill:  0    8/29- pt req change in pharmacy to [place]   Patient Instructions     No change in meds today. Condoms recommended.  Repeat STI testing in 3 months. Recheck testosterone labs in 6 months.  If reading today is low, may be due to time of day and can recheck reading between 8 and 10am with lab only visit.   Thanks for coming in today.   If you have lab work done today you will be contacted with your lab results within the next 2 weeks.  If you have not heard from [place] then please contact us. The fastest way to get your results is to register for My Chart.   IF you received an x-ray today, you will receive an invoice from [facility] Radiology. Please contact [facility] Radiology at [phone] with questions or concerns regarding your invoice.   IF you received labwork today, you will receive an invoice from [place]. Please contact [facility] at [phone] with questions or concerns regarding your invoice.   Our billing staff will not be able to assist you  with questions regarding bills from these companies.  You will be contacted with the lab results as soon as they are available. The fastest way to get your results is to activate your My Chart account. Instructions are located on the last page of this paperwork. If you have not heard from [place] regarding the results in 2 weeks, please contact this office.       Signed,   [name], MD [facility] [place].  04/15/18 2:37 PM

## 2018-04-16 LAB — TESTOSTERONE: TESTOSTERONE: 358 ng/dL (ref 264–916)

## 2018-04-16 LAB — PSA: PROSTATE SPECIFIC AG, SERUM: 1.8 ng/mL (ref 0.0–4.0)

## 2018-04-16 LAB — RPR: RPR: NONREACTIVE

## 2018-04-16 LAB — HIV ANTIBODY (ROUTINE TESTING W REFLEX): HIV SCREEN 4TH GENERATION: NONREACTIVE

## 2018-04-17 LAB — GC/CHLAMYDIA PROBE AMP
CHLAMYDIA, DNA PROBE: NEGATIVE
Neisseria gonorrhoeae by PCR: NEGATIVE

## 2018-05-19 ENCOUNTER — Other Ambulatory Visit: Payer: Self-pay | Admitting: Family Medicine

## 2018-05-19 DIAGNOSIS — R7989 Other specified abnormal findings of blood chemistry: Secondary | ICD-10-CM

## 2018-05-19 DIAGNOSIS — E291 Testicular hypofunction: Secondary | ICD-10-CM

## 2018-05-20 NOTE — Telephone Encounter (Signed)
Requested medication (s) are due for refill today: yes  Requested medication (s) are on the active medication list: yes  Last refill:  12/20/17  Future visit scheduled: no  Notes to clinic:  Forwarded to clinic   Requested Prescriptions  Pending Prescriptions Disp Refills   Testosterone 30 MG/ACT SOLN [Pharmacy Med Name: TESTOSTERONE 30 MG/1.5 ML PUMP] 90 mL 2    Sig: PLACE 30MG  ONTO THE SKIN EVERY MORNING     Off-Protocol Failed - 05/19/2018 11:30 AM      Failed - Medication not assigned to a protocol, review manually.      Passed - Valid encounter within last 12 months    Recent Outpatient Visits          1 month ago Hypogonadism male   [facility] at [facility], Ranell Patrick, MD   3 months ago Paronychia of finger of right hand   [facility] at [facility], Gelene Mink, PA-C   4 months ago Lower respiratory infection   [facility] at [facility], [place], PA-C   4 months ago Routine screening for STI (sexually transmitted infection)   [facility], Ranell Patrick, MD   5 months ago Acute gout involving toe of left foot, unspecified cause   Primary Care at [place], [place]

## 2018-07-04 ENCOUNTER — Encounter: Payer: Self-pay | Admitting: Family Medicine

## 2018-07-04 ENCOUNTER — Ambulatory Visit: Payer: BC Managed Care – PPO | Admitting: Family Medicine

## 2018-07-04 ENCOUNTER — Other Ambulatory Visit: Payer: Self-pay

## 2018-07-04 ENCOUNTER — Other Ambulatory Visit: Payer: Self-pay | Admitting: Physician Assistant

## 2018-07-04 VITALS — BP 135/82 | HR 76 | Temp 98.5°F | Resp 18 | Ht 67.17 in | Wt 180.0 lb

## 2018-07-04 DIAGNOSIS — D485 Neoplasm of uncertain behavior of skin: Secondary | ICD-10-CM

## 2018-07-04 DIAGNOSIS — L989 Disorder of the skin and subcutaneous tissue, unspecified: Secondary | ICD-10-CM | POA: Diagnosis not present

## 2018-07-04 NOTE — Patient Instructions (Addendum)
If you have lab work done today you will be contacted with your lab results within the next 2 weeks.  If you have not heard from [place] then please contact us. The fastest way to get your results is to register for My Chart.   IF you received an x-ray today, you will receive an invoice from [facility]. Please contact [facility] at [phone] with questions or concerns regarding your invoice.   IF you received labwork today, you will receive an invoice from [facility]. Please contact [facility] at [phone] with questions or concerns regarding your invoice.   Our billing staff will not be able to assist you with questions regarding bills from these companies.  You will be contacted with the lab results as soon as they are available. The fastest way to get your results is to activate your My Chart account. Instructions are located on the last page of this paperwork. If you have not heard from [place] regarding the results in 2 weeks, please contact this office.     Skin Biopsy, Care After Refer to this sheet in the next few weeks. These instructions provide you with information about caring for yourself after your procedure. Your health care provider may also give you more specific instructions. Your treatment has been planned according to current medical practices, but problems sometimes occur. Call your health care provider if you have any problems or questions after your procedure. What can I expect after the procedure? After the procedure, it is common to have:  Soreness.  Bruising.  Itching.  Follow these instructions at home:  Rest and then return to your normal activities as told by your health care provider.  Take over-the-counter and prescription medicines only as told by your health care provider.  Follow instructions from your health care provider about how to take care of your biopsy site.Make sure you: ? Wash your hands with soap and water before you  change your bandage (dressing). If soap and water are not available, use hand sanitizer. ? Change your dressing as told by your health care provider. ? Leave stitches (sutures), skin glue, or adhesive strips in place. These skin closures may need to stay in place for 2 weeks or longer. If adhesive strip edges start to loosen and curl up, you may trim the loose edges. Do not remove adhesive strips completely unless your health care provider tells you to do that. If the biopsy area bleeds, apply gentle pressure for 10 minutes.  Check your biopsy site every day for signs of infection. Check for: ? More redness, swelling, or pain. ? More fluid or blood. ? Warmth. ? Pus or a bad smell.  Keep all follow-up visits as told by your health care provider. This is important. Contact a health care provider if:  You have more redness, swelling, or pain around your biopsy site.  You have more fluid or blood coming from your biopsy site.  Your biopsy site feels warm to the touch.  You have pus or a bad smell coming from your biopsy site.  You have a fever. Get help right away if:  You have bleeding that does not stop with pressure or a dressing. This information is not intended to replace advice given to you by your health care provider. Make sure you discuss any questions you have with your health care provider. Document Released: 08/26/2015 Document Revised: 03/25/2016 Document Reviewed: 10/27/2014 Elsevier Interactive Patient Education  2018 [employer].  Skin Biopsy A  skin biopsy is a procedure to remove a sample of your skin (lesion) so it can be checked under a microscope. You may need a skin biopsy if you have a skin disease or abnormal changes in your skin. Tell a health care provider about:  Any allergies you have.  All medicines you are taking, including vitamins, herbs, eye drops, creams, and over-the-counter medicines.  Any problems you or family members have had with anesthetic  medicines.  Any blood disorders you have.  Any surgeries you have had.  Any medical conditions you have. What are the risks? Generally, this is a safe procedure. However, problems may occur, including:  Infection.  Bleeding.  Allergic reaction to medicines.  Scarring.  What happens before the procedure?  Ask your health care provider about changing or stopping your regular medicines. This is especially important if you are taking diabetes medicines or blood thinners.  Follow instructions from your health care provider about how to care for your skin.  Ask your health care provider how your biopsy site will be marked or identified.  You may be given antibiotic medicine to prevent infection. What happens during the procedure?  To reduce your risk of infection: ? Your health care team will wash or sanitize their hands. ? Your skin will be washed with soap.  You may be given a medicine to help you relax (sedative).  You will be givena medicine to numb the area (local anesthetic).  The method that your health care provider will use for your skin biopsy will depend on the type of skin problem you have. Options include: ? Shave biopsy. Your health care provider will shave away layers of your skin lesion with a sharp blade. After shaving, a gel or ointment may be used to control bleeding. ? Punch biopsy. Your health care provider will use a tool to remove all or part of the lesion. This leaves a small hole about the width of a pencil eraser. The area may be covered with a gel or ointment. ? Excisional or incisional biopsy. Your health care provider will use a surgical blade to remove all or part of your lesion.  Your skin biopsy site may be closed with stitches (sutures).  A bandage (dressing) will be applied. The procedure may vary among health care providers and hospitals. What happens after the procedure?  Your skin sample will be sent to a laboratory for  examination.  Your skin biopsy site will be watched to make sure that it stops bleeding.  Do not drive for 24 hours if you received a sedative. This information is not intended to replace advice given to you by your health care provider. Make sure you discuss any questions you have with your health care provider. Document Released: 08/31/2004 Document Revised: 03/25/2016 Document Reviewed: 10/27/2014 Elsevier Interactive Patient Education  [employer].

## 2018-07-04 NOTE — Progress Notes (Signed)
Subjective:    Patient: Jeremy Hendricks  DOB: Mar 21, 1956; 62 y.o.   MRN: [id number]  Chief Complaint  Patient presents with  . Sore    X 1.5 mth on left arm- pt states he thinks a thorn     HPI Notieced inflammation the day after doing yard work so pried/prodded to get it out but caused more inflammaiton so left it alone.  Then 4d ago he formed a blood blister with lots of blood came out and there since w/ scab. Nother ever drained. No FB. Is from [facility] so lonts of sun exp and concerned aobut skin cancer.   Medical History Past Medical History:  Diagnosis Date  . Atrial fibrillation ([place])    postoperative  . Chronic diastolic CHF (congestive heart failure) ([place])   . CKD (chronic kidney disease), stage II   . Coronary artery disease    a. s/p CABG 2002. b. s/p redo 2012.  Marland Kitchen DJD (degenerative joint disease)   . Gout   . History of ETT    a. ETT 6/16:  normal   Past Surgical History:  Procedure Laterality Date  . CORONARY ARTERY BYPASS GRAFT  2002  . CORONARY ARTERY BYPASS GRAFT  08-2010   L-LAD remained from original CABG; new grafts incl L radial- PDA + RIMA-RI  . VASECTOMY     Current Outpatient Medications on File Prior to Visit  Medication Sig Dispense Refill  . aspirin EC 81 MG tablet Take 81 mg daily by mouth.    Marland Kitchen atenolol (TENORMIN) 25 MG tablet Take 1 tablet (25 mg total) by mouth daily. 90 tablet 3  . emtricitabine-tenofovir (TRUVADA) 200-300 MG tablet Take 1 tablet by mouth daily. 90 tablet 0  . furosemide (LASIX) 40 MG tablet Take 40 mg by mouth.    . Multiple Vitamin (MULTIVITAMIN) tablet Take 1 tablet by mouth daily.      . nitroGLYCERIN (NITROSTAT) 0.4 MG SL tablet Place 0.4 mg under the tongue every 5 (five) minutes as needed for chest pain. Reported on 09/21/2015    . predniSONE (DELTASONE) 20 MG tablet Take 20 mg by mouth daily as needed (GOUT).    . rosuvastatin (CRESTOR) 40 MG tablet Take 1 tablet (40 mg total) by mouth daily. 90 tablet 3  . Testosterone 30  MG/ACT SOLN PLACE 30MG  ONTO THE SKIN EVERY MORNING 90 mL 2   No current facility-administered medications on file prior to visit.    No Known Allergies Family History  Problem Relation Age of Onset  . Heart attack Father 34  . Hypertension Father   . Cancer Mother        Breast cancer  . Cancer Unknown        family hx of  . Coronary artery disease Unknown        family hx of  . Hyperlipidemia Unknown        family hx of  . Hypertension Paternal Grandfather   . Stroke Neg Hx   . Colon cancer Neg Hx    Social History   Socioeconomic History  . Marital status: Single    Spouse name: Not on file  . Number of children: 2  . Years of education: Not on file  . Highest education level: Not on file  Occupational History  . Occupation: Cabin crew  Social Needs  . Financial resource strain: Not on file  . Food insecurity:    Worry: Not on file    Inability: Not on file  .  Transportation needs:    Medical: Not on file    Non-medical: Not on file  Tobacco Use  . Smoking status: Former Smoker    Last attempt to quit: 08/14/1999    Years since quitting: 18.9  . Smokeless tobacco: Never Used  Substance and Sexual Activity  . Alcohol use: Yes    Alcohol/week: 14.0 standard drinks    Types: 14 Standard drinks or equivalent per week  . Drug use: No  . Sexual activity: Yes  Lifestyle  . Physical activity:    Days per week: Not on file    Minutes per session: Not on file  . Stress: Not on file  Relationships  . Social connections:    Talks on phone: Not on file    Gets together: Not on file    Attends religious service: Not on file    Active member of club or organization: Not on file    Attends meetings of clubs or organizations: Not on file    Relationship status: Not on file  Other Topics Concern  . Not on file  Social History Narrative   Single   Education: College   Exercise: Yes   Depression screen [facility] 2/9 07/04/2018 04/15/2018 01/07/2018 12/30/2017 12/12/2017  Decreased  Interest 0 0 0 0 0  Down, Depressed, Hopeless 0 0 0 0 0  PHQ - 2 Score 0 0 0 0 0    ROS As noted in HPI  Objective:  BP 135/82   Pulse 76   Temp 98.5 F (36.9 C) (Oral)   Resp 18   Ht 5' 7.17" (1.706 m)   Wt 180 lb (81.6 kg)   SpO2 98%   BMI 28.05 kg/m  Physical Exam  Constitutional: He is oriented to person, place, and time. He appears well-developed and well-nourished. No distress.  HENT:  Head: Normocephalic and atraumatic.  Eyes: No scleral icterus.  Pulmonary/Chest: Effort normal.  Neurological: He is alert and oriented to person, place, and time.  Skin: Skin is warm and dry. He is not diaphoretic.  Psychiatric: He has a normal mood and affect. His behavior is normal.     Assessment & Plan:   1. Skin lesion of left arm    Specimen Source: distal 80mm punch with deeper tissue attached Specimen Location: lower left Clinical History: lesion x 1.5 mos appeared day after yardwork so thought thorn - irritation with manipulation but never found FB, improved until blood blister and copious bleeding 4d ago, no scabbed over but inflammed, concerned about skin cancer Clinical Diagnosis: lesion x 1.5 mos appeared day after yardwork so thought thorn - irritation with manipulation but never found FB, improved until blood blister and copious bleeding 4d ago, no scabbed over but inflammed, concerned about skin cance        Specimen Source: more proximal 47mm punch that cam in several peices - quesiton of point of entry of FB as subcutanoues seperation/tunneling visible Specimen Location: lower left Clinical History: lesion x 1.5 mos appeared day after yardwork so thought thorn - irritation with manipulation but never found FB, improved until blood blister and copious bleeding 4d ago, no scabbed over but inflammed, concerned about skin cancer Clinical Diagnosis: irritation from FB vs squamous cell carcinoma. No FB seen during wound exploration with needle/forceps/sterile water  flush  Patient will continue on current chronic medications other than changes noted above, so ok to refill when needed.   See after visit summary for patient specific instructions.  No orders of  the defined types were placed in this encounter.   No orders of the defined types were placed in this encounter.   Patient verbalized to me that they understand the following: diagnosis, what is being done for them, what to expect and what should be done at home.  Their questions have been answered. They understand that I am unable to predict every possible medication interaction or adverse outcome and that if any unexpected symptoms arise, they should contact us and their pharmacist, as well as never hesitate to seek urgent/emergent care at [facility] or ER if they think it might be warranted.    Delman Cheadle, MD, MPH [facility] [address] [place], [place]  [address] [phone] Office phone  [phone] Office fax  07/04/18 1:24 PM

## 2018-08-04 ENCOUNTER — Encounter: Payer: Self-pay | Admitting: Physician Assistant

## 2018-08-14 ENCOUNTER — Ambulatory Visit: Payer: BC Managed Care – PPO | Admitting: Physician Assistant

## 2018-09-04 ENCOUNTER — Ambulatory Visit: Payer: BC Managed Care – PPO | Admitting: Physician Assistant

## 2018-09-06 ENCOUNTER — Other Ambulatory Visit: Payer: Self-pay | Admitting: Physician Assistant

## 2018-09-10 ENCOUNTER — Other Ambulatory Visit: Payer: Self-pay | Admitting: Physician Assistant

## 2018-09-16 ENCOUNTER — Other Ambulatory Visit: Payer: Self-pay | Admitting: Family Medicine

## 2018-09-16 DIAGNOSIS — Z113 Encounter for screening for infections with a predominantly sexual mode of transmission: Secondary | ICD-10-CM

## 2018-09-16 NOTE — Telephone Encounter (Signed)
Requested medication (s) are due for refill today: Yes  Requested medication (s) are on the active medication list: Yes  Last refill:  04/15/18  Future visit scheduled: No  Notes to clinic:  Unable to refill, medication not assigned to protocol     Requested Prescriptions  Pending Prescriptions Disp Refills   TRUVADA 200-300 MG tablet [Pharmacy Med Name: TRUVADA 200/300MG ] 90 tablet 0    Sig: TAKE ONE TABLET BY MOUTH ONCE DAILY WITH OR WITHOUT FOOD. STORE IN ORIGINAL CONTAINER AT ROOM TEMPERATURE.     Off-Protocol Failed - 09/16/2018  3:48 PM      Failed - Medication not assigned to a protocol, review manually.      Passed - Valid encounter within last 12 months    Recent Outpatient Visits          2 months ago Skin lesion of left arm   [facility], Laurey Arrow, MD   5 months ago Hypogonadism male   [facility], Ranell Patrick, MD   7 months ago Paronychia of finger of right hand   [facility] at [facility], Gelene Mink, PA-C   8 months ago Lower respiratory infection   [facility] at [facility], [place], PA-C   8 months ago Routine screening for STI (sexually transmitted infection)   [facility], Ranell Patrick, MD

## 2018-09-17 ENCOUNTER — Encounter: Payer: Self-pay | Admitting: Family Medicine

## 2018-09-17 DIAGNOSIS — M109 Gout, unspecified: Secondary | ICD-10-CM

## 2018-09-20 MED ORDER — PREDNISONE 20 MG PO TABS: 40.0000 mg | ORAL_TABLET | Freq: Every day | ORAL | 0 refills | Status: DC

## 2018-09-22 NOTE — Telephone Encounter (Signed)
pls see note 

## 2018-09-23 ENCOUNTER — Ambulatory Visit: Payer: BC Managed Care – PPO | Admitting: Physician Assistant

## 2018-09-23 ENCOUNTER — Encounter: Payer: Self-pay | Admitting: Physician Assistant

## 2018-09-23 VITALS — BP 134/88 | HR 58 | Ht 68.0 in | Wt 181.0 lb

## 2018-09-23 DIAGNOSIS — I5032 Chronic diastolic (congestive) heart failure: Secondary | ICD-10-CM

## 2018-09-23 DIAGNOSIS — N182 Chronic kidney disease, stage 2 (mild): Secondary | ICD-10-CM

## 2018-09-23 DIAGNOSIS — I25810 Atherosclerosis of coronary artery bypass graft(s) without angina pectoris: Secondary | ICD-10-CM | POA: Diagnosis not present

## 2018-09-23 DIAGNOSIS — E785 Hyperlipidemia, unspecified: Secondary | ICD-10-CM

## 2018-09-23 NOTE — Progress Notes (Signed)
Cardiology Office Note    Date:  09/23/2018   ID:  Jeremy Hendricks, DOB [birth year], MRN [id number]  PCP:  Jeremy Agreste, MD  Cardiologist: Jeremy Chandler, MD EPS: None  Chief Complaint  Patient presents with  . Follow-up    History of Present Illness:  Jeremy Hendricks is a 63 y.o. male with history of CAD status post CABG in 2002 redo 2012, postop A. fib, chronic diastolic CHF.  Echo 2014 normal LVEF, normal GXT 01/2015 CKD last creatinine 1.23-03/2018.  Patient was last seen 03/13/2018 by Jeremy Ku, PA-C and was doing well.  Last saw Jeremy Hendricks 01/2016.  Patient comes in for f/u. Still having some trouble with fluid but controlled with lasix.  He admits to liking salt.  Does elliptical and row machine 3-4 days/week. Feels better the more cardio that he does.  Getting ready to go on a 9-day trip through [place].  Denies chest pain, palpitations, dyspnea, dyspnea on exertion, dizziness or presyncope.   Past Medical History:  Diagnosis Date  . Atrial fibrillation ([place])    postoperative  . Chronic diastolic CHF (congestive heart failure) ([place])   . CKD (chronic kidney disease), stage II   . Coronary artery disease    a. s/p CABG 2002. b. s/p redo 2012.  Marland Kitchen DJD (degenerative joint disease)   . Gout   . History of ETT    a. ETT 6/16:  normal    Past Surgical History:  Procedure Laterality Date  . CORONARY ARTERY BYPASS GRAFT  2002  . CORONARY ARTERY BYPASS GRAFT  08-2010   L-LAD remained from original CABG; new grafts incl L radial- PDA + RIMA-RI  . VASECTOMY      Current Medications: Current Meds  Medication Sig  . aspirin EC 81 MG tablet Take 81 mg daily by mouth.  Marland Kitchen atenolol (TENORMIN) 25 MG tablet TAKE 1 TABLET BY MOUTH EVERY DAY  . emtricitabine-tenofovir (TRUVADA) 200-300 MG tablet Take 1 tablet by mouth daily.  . furosemide (LASIX) 40 MG tablet TAKE 1 TABLET BY MOUTH DAILY AND 1/2 TABLET BY MOUTH DAILY AS NEEDED FOR SWELLING  . Multiple Vitamin (MULTIVITAMIN)  tablet Take 1 tablet by mouth daily.    . nitroGLYCERIN (NITROSTAT) 0.4 MG SL tablet Place 0.4 mg under the tongue every 5 (five) minutes as needed for chest pain. Reported on 09/21/2015  . predniSONE (DELTASONE) 20 MG tablet Take 2 tablets (40 mg total) by mouth daily with breakfast.  . rosuvastatin (CRESTOR) 40 MG tablet TAKE 1 TABLET BY MOUTH EVERY DAY  . Testosterone 30 MG/ACT SOLN PLACE 30MG  ONTO THE SKIN EVERY MORNING     Allergies:   Patient has no known allergies.   Social History   Socioeconomic History  . Marital status: Single    Spouse name: Not on file  . Number of children: 2  . Years of education: Not on file  . Highest education level: Not on file  Occupational History  . Occupation: Cabin crew  Social Needs  . Financial resource strain: Not on file  . Food insecurity:    Worry: Not on file    Inability: Not on file  . Transportation needs:    Medical: Not on file    Non-medical: Not on file  Tobacco Use  . Smoking status: Former Smoker    Last attempt to quit: 08/14/1999    Years since quitting: 19.1  . Smokeless tobacco: Never Used  Substance and Sexual Activity  . Alcohol use:  Yes    Alcohol/week: 14.0 standard drinks    Types: 14 Standard drinks or equivalent per week  . Drug use: No  . Sexual activity: Yes  Lifestyle  . Physical activity:    Days per week: Not on file    Minutes per session: Not on file  . Stress: Not on file  Relationships  . Social connections:    Talks on phone: Not on file    Gets together: Not on file    Attends religious service: Not on file    Active member of club or organization: Not on file    Attends meetings of clubs or organizations: Not on file    Relationship status: Not on file  Other Topics Concern  . Not on file  Social History Narrative   Single   Education: College   Exercise: Yes     Family History:  The patient's family history includes Cancer in his mother and unknown relative; Coronary artery disease in  his unknown relative; Heart attack (age of onset: 63) in his father; Hyperlipidemia in his unknown relative; Hypertension in his father and paternal grandfather.   ROS:   Please see the history of present illness.    Review of Systems  Constitution: Negative.  HENT: Negative.   Cardiovascular: Positive for leg swelling.  Respiratory: Negative.   Endocrine: Negative.   Hematologic/Lymphatic: Negative.   Musculoskeletal: Negative.   Gastrointestinal: Negative.   Genitourinary: Negative.   Neurological: Negative.    All other systems reviewed and are negative.   PHYSICAL EXAM:   VS:  BP 134/88 (BP Location: Left Arm, Patient Position: Sitting, Cuff Size: Normal)   Pulse (!) 58   Ht 5\' 8"  (1.727 m)   Wt 181 lb (82.1 kg)   SpO2 100% Comment: at rest  BMI 27.52 kg/m   Physical Exam  GEN: Well nourished, well developed, in no acute distress  Neck: no JVD, carotid bruits, or masses Cardiac:RRR; no murmurs, rubs, or gallops  Respiratory:  clear to auscultation bilaterally, normal work of breathing GI: soft, nontender, nondistended, + BS Ext: without cyanosis, clubbing, or edema, Good distal pulses bilaterally Neuro:  Alert and Oriented x 3 Psych: euthymic mood, full affect  Wt Readings from Last 3 Encounters:  09/23/18 181 lb (82.1 kg)  07/04/18 180 lb (81.6 kg)  04/15/18 179 lb 3.2 oz (81.3 kg)      Studies/Labs Reviewed:   EKG:  EKG is not ordered today.      Recent Labs: 10/10/2017: Hemoglobin 15.0; Platelets 262 03/13/2018: BUN 21; Creatinine, Ser 1.23; Potassium 4.3; Sodium 139   Lipid Panel    Component Value Date/Time   CHOL 91 (L) 08/15/2017 1030   TRIG 97 08/15/2017 1030   HDL 30 (L) 08/15/2017 1030   CHOLHDL 3.0 08/15/2017 1030   CHOLHDL 3.7 02/03/2016 0839   VLDL 21 02/03/2016 0839   LDLCALC 42 08/15/2017 1030    Additional studies/ records that were reviewed today include:  2D echo 2014Study Conclusions  Left ventricle: The cavity size was normal. Wall  thickness was increased in a pattern of mild LVH. Systolic function was normal. The estimated ejection fraction was in the range of 60% to 65%.  ------------------------------------------------------------      ASSESSMENT:    1. Atherosclerosis of coronary artery bypass graft of native heart without angina pectoris   2. Hyperlipidemia, unspecified hyperlipidemia type   3. Chronic diastolic CHF (congestive heart failure) ([place])   4. CKD (chronic kidney disease) stage 2,  GFR 60-89 ml/min      PLAN:  In order of problems listed above:  CAD status post redo CABG in 2012 with postop atrial fibrillation initially treated with amiodarone but this was stopped and maintaining normal sinus rhythm-no angina.  Follow-up with Jeremy Hendricks in 1 year.  Hyperlipidemia on Crestor.  Needs fasting lipid panel in 1 year.  Chronic diastolic CHF still having trouble with swelling and is on daily Lasix.  2 g sodium diet.  Will update echo to make sure LV function is stable.  CKD stage II last creatinine 1.23 03/2018 repeat renal function with lipid panel.    Medication Adjustments/Labs and Tests Ordered: Current medicines are reviewed at length with the patient today.  Concerns regarding medicines are outlined above.  Medication changes, Labs and Tests ordered today are listed in the Patient Instructions below. Patient Instructions   Medication Instructions:  Your physician recommends that you continue on your current medications as directed. Please refer to the Current Medication list given to you today.  If you need a refill on your cardiac medications before your next appointment, please call your pharmacy.   Lab work: Your physician recommends that you return for a FASTING lipid profile, basic metabolic panel, and liver function panel on 10/08/18  If you have labs (blood work) drawn today and your tests are completely normal, you will receive your results only by: Marland Kitchen MyChart Message (if you  have MyChart) OR . A paper copy in the mail If you have any lab test that is abnormal or we need to change your treatment, we will call you to review the results.  Testing/Procedures: Your physician has requested that you have an echocardiogram on 10/08/18. Please arrive at 7:00 AM. Echocardiography is a painless test that uses sound waves to create images of your heart. It provides your doctor with information about the size and shape of your heart and how well your heart's chambers and valves are working. This procedure takes approximately one hour. There are no restrictions for this procedure.    Follow-Up: At [facility], you and your health needs are our priority.  As part of our continuing mission to provide you with exceptional heart care, we have created designated Provider Care Teams.  These Care Teams include your primary Cardiologist (physician) and Advanced Practice Providers (APPs -  Physician Assistants and Nurse Practitioners) who all work together to provide you with the care you need, when you need it. . You will need a follow up appointment in 1 year.  Please call our office 2 months in advance to schedule this appointment.  You may see Darlina Guys, MD or one of the following Advanced Practice Providers on your designated Care Team:   . Lyda Jester, PA-C . Dayna Dunn, PA-C . Ermalinda Barrios, PA-C  Any Other Special Instructions Will Be Listed Below (If Applicable).   Echocardiogram An echocardiogram is a procedure that uses painless sound waves (ultrasound) to produce an image of the heart. Images from an echocardiogram can provide important information about:  Signs of coronary artery disease (CAD).  Aneurysm detection. An aneurysm is a weak or damaged part of an artery wall that bulges out from the normal force of blood pumping through the body.  Heart size and shape. Changes in the size or shape of the heart can be associated with certain conditions, including  heart failure, aneurysm, and CAD.  Heart muscle function.  Heart valve function.  Signs of a past heart attack.  Fluid buildup around the heart.  Thickening of the heart muscle.  A tumor or infectious growth around the heart valves. Tell a health care provider about:  Any allergies you have.  All medicines you are taking, including vitamins, herbs, eye drops, creams, and over-the-counter medicines.  Any blood disorders you have.  Any surgeries you have had.  Any medical conditions you have.  Whether you are pregnant or may be pregnant. What are the risks? Generally, this is a safe procedure. However, problems may occur, including:  Allergic reaction to dye (contrast) that may be used during the procedure. What happens before the procedure? No specific preparation is needed. You may eat and drink normally. What happens during the procedure?   An IV tube may be inserted into one of your veins.  You may receive contrast through this tube. A contrast is an injection that improves the quality of the pictures from your heart.  A gel will be applied to your chest.  A wand-like tool (transducer) will be moved over your chest. The gel will help to transmit the sound waves from the transducer.  The sound waves will harmlessly bounce off of your heart to allow the heart images to be captured in real-time motion. The images will be recorded on a computer. The procedure may vary among health care providers and hospitals. What happens after the procedure?  You may return to your normal, everyday life, including diet, activities, and medicines, unless your health care provider tells you not to do that. Summary  An echocardiogram is a procedure that uses painless sound waves (ultrasound) to produce an image of the heart.  Images from an echocardiogram can provide important information about the size and shape of your heart, heart muscle function, heart valve function, and fluid  buildup around your heart.  You do not need to do anything to prepare before this procedure. You may eat and drink normally.  After the echocardiogram is completed, you may return to your normal, everyday life, unless your health care provider tells you not to do that. This information is not intended to replace advice given to you by your health care provider. Make sure you discuss any questions you have with your health care provider. Document Released: 07/27/2000 Document Revised: 09/01/2016 Document Reviewed: 09/01/2016 Elsevier Interactive Patient Education  2019 [place].  Two Gram Sodium Diet 2000 mg  What is Sodium? Sodium is a mineral found naturally in many foods. The most significant source of sodium in the diet is table salt, which is about 40% sodium.  Processed, convenience, and preserved foods also contain a large amount of sodium.  The body needs only 500 mg of sodium daily to function,  A normal diet provides more than enough sodium even if you do not use salt.  Why Limit Sodium? A build up of sodium in the body can cause thirst, increased blood pressure, shortness of breath, and water retention.  Decreasing sodium in the diet can reduce edema and risk of heart attack or stroke associated with high blood pressure.  Keep in mind that there are many other factors involved in these health problems.  Heredity, obesity, lack of exercise, cigarette smoking, stress and what you eat all play a role.  General Guidelines:  Do not add salt at the table or in cooking.  One teaspoon of salt contains over 2 grams of sodium.  Read food labels  Avoid processed and convenience foods  Ask your dietitian before eating any foods not  dicussed in the menu planning guidelines  Consult your physician if you wish to use a salt substitute or a sodium containing medication such as antacids.  Limit milk and milk products to 16 oz (2 cups) per day.  Shopping Hints:  READ LABELS!! "Dietetic"  does not necessarily mean low sodium.  Salt and other sodium ingredients are often added to foods during processing.   Menu Planning Guidelines Food Group Choose More Often Avoid  Beverages (see also the milk group All fruit juices, low-sodium, salt-free vegetables juices, low-sodium carbonated beverages Regular vegetable or tomato juices, commercially softened water used for drinking or cooking  Breads and Cereals Enriched white, wheat, rye and pumpernickel bread, hard rolls and dinner rolls; muffins, cornbread and waffles; most dry cereals, cooked cereal without added salt; unsalted crackers and breadsticks; low sodium or homemade bread crumbs Bread, rolls and crackers with salted tops; quick breads; instant hot cereals; pancakes; commercial bread stuffing; self-rising flower and biscuit mixes; regular bread crumbs or cracker crumbs  Desserts and Sweets Desserts and sweets mad with mild should be within allowance Instant pudding mixes and cake mixes  Fats Butter or margarine; vegetable oils; unsalted salad dressings, regular salad dressings limited to 1 Tbs; light, sour and heavy cream Regular salad dressings containing bacon fat, bacon bits, and salt pork; snack dips made with instant soup mixes or processed cheese; salted nuts  Fruits Most fresh, frozen and canned fruits Fruits processed with salt or sodium-containing ingredient (some dried fruits are processed with sodium sulfites        Vegetables Fresh, frozen vegetables and low- sodium canned vegetables Regular canned vegetables, sauerkraut, pickled vegetables, and others prepared in brine; frozen vegetables in sauces; vegetables seasoned with ham, bacon or salt pork  Condiments, Sauces, Miscellaneous  Salt substitute with physician's approval; pepper, herbs, spices; vinegar, lemon or lime juice; hot pepper sauce; garlic powder, onion powder, low sodium soy sauce (1 Tbs.); low sodium condiments (ketchup, chili sauce, mustard) in limited  amounts (1 tsp.) fresh ground horseradish; unsalted tortilla chips, pretzels, potato chips, popcorn, salsa (1/4 cup) Any seasoning made with salt including garlic salt, celery salt, onion salt, and seasoned salt; sea salt, rock salt, kosher salt; meat tenderizers; monosodium glutamate; mustard, regular soy sauce, barbecue, sauce, chili sauce, teriyaki sauce, steak sauce, Worcestershire sauce, and most flavored vinegars; canned gravy and mixes; regular condiments; salted snack foods, olives, picles, relish, horseradish sauce, catsup   Food preparation: Try these seasonings Meats:    Pork Sage, onion Serve with applesauce  Chicken Poultry seasoning, thyme, parsley Serve with cranberry sauce  Lamb Curry powder, rosemary, garlic, thyme Serve with mint sauce or jelly  Veal Marjoram, basil Serve with current jelly, cranberry sauce  Beef Pepper, bay leaf Serve with dry mustard, unsalted chive butter  Fish [place] leaf, dill Serve with unsalted lemon butter, unsalted parsley butter  Vegetables:    Asparagus Lemon juice   Broccoli Lemon juice   Carrots Mustard dressing parsley, mint, nutmeg, glazed with unsalted butter and sugar   Green beans Marjoram, lemon juice, nutmeg,dill seed   Tomatoes Basil, marjoram, onion   Spice /blend for [employer]" 4 tsp ground thyme 1 tsp ground sage 3 tsp ground rosemary 4 tsp ground marjoram   Test your knowledge 1. A product that says "Salt Free" may still contain sodium. True or False 2. Garlic Powder and Hot Pepper Sauce an be used as alternative seasonings.True or False 3. Processed foods have more sodium than fresh foods.  True or  False 4. Canned Vegetables have less sodium than froze True or False  WAYS TO DECREASE YOUR SODIUM INTAKE 1. Avoid the use of added salt in cooking and at the table.  Table salt (and other prepared seasonings which contain salt) is probably one of the greatest sources of sodium in the diet.  Unsalted foods can gain flavor from the  sweet, sour, and butter taste sensations of herbs and spices.  Instead of using salt for seasoning, try the following seasonings with the foods listed.  Remember: how you use them to enhance natural food flavors is limited only by your creativity... Allspice-Meat, fish, eggs, fruit, peas, red and yellow vegetables Almond Extract-Fruit baked goods Anise Seed-Sweet breads, fruit, carrots, beets, cottage cheese, cookies (tastes like licorice) Basil-Meat, fish, eggs, vegetables, rice, vegetables salads, soups, sauces Bay Leaf-Meat, fish, stews, poultry Burnet-Salad, vegetables (cucumber-like flavor) Caraway Seed-Bread, cookies, cottage cheese, meat, vegetables, cheese, rice Cardamon-Baked goods, fruit, soups Celery Powder or seed-Salads, salad dressings, sauces, meatloaf, soup, bread.Do not use  celery salt Chervil-Meats, salads, fish, eggs, vegetables, cottage cheese (parsley-like flavor) Chili Power-Meatloaf, chicken cheese, corn, eggplant, egg dishes Chives-Salads cottage cheese, egg dishes, soups, vegetables, sauces Cilantro-Salsa, casseroles Cinnamon-Baked goods, fruit, pork, lamb, chicken, carrots Cloves-Fruit, baked goods, fish, pot roast, green beans, beets, carrots Coriander-Pastry, cookies, meat, salads, cheese (lemon-orange flavor) Cumin-Meatloaf, fish,cheese, eggs, cabbage,fruit pie (caraway flavor) [employer], fruit, eggs, fish, poultry, cottage cheese, vegetables Dill Seed-Meat, cottage cheese, poultry, vegetables, fish, salads, bread Fennel Seed-Bread, cookies, apples, pork, eggs, fish, beets, cabbage, cheese, Licorice-like flavor Garlic-(buds or powder) Salads, meat, poultry, fish, bread, butter, vegetables, potatoes.Do not  use garlic salt Ginger-Fruit, vegetables, baked goods, meat, fish, poultry Horseradish Root-Meet, vegetables, butter Lemon Juice or Extract-Vegetables, fruit, tea, baked goods, fish salads Mace-Baked goods fruit, vegetables, fish, poultry (taste like  nutmeg) Maple Extract-Syrups Marjoram-Meat, chicken, fish, vegetables, breads, green salads (taste like Sage) Mint-Tea, lamb, sherbet, vegetables, desserts, carrots, cabbage Mustard, Dry or Seed-Cheese, eggs, meats, vegetables, poultry Nutmeg-Baked goods, fruit, chicken, eggs, vegetables, desserts Onion Powder-Meat, fish, poultry, vegetables, cheese, eggs, bread, rice salads (Do not use   Onion salt) Orange Extract-Desserts, baked goods Oregano-Pasta, eggs, cheese, onions, pork, lamb, fish, chicken, vegetables, green salads Paprika-Meat, fish, poultry, eggs, cheese, vegetables Parsley Flakes-Butter, vegetables, meat fish, poultry, eggs, bread, salads (certain forms may   Contain sodium Pepper-Meat fish, poultry, vegetables, eggs Peppermint Extract-Desserts, baked goods Poppy Seed-Eggs, bread, cheese, fruit dressings, baked goods, noodles, vegetables, cottage  [employer], poultry, meat, fish, cauliflower, turnips,eggs bread Saffron-Rice, bread, veal, chicken, fish, eggs Sage-Meat, fish, poultry, onions, eggplant, tomateos, pork, stews Savory-Eggs, salads, poultry, meat, rice, vegetables, soups, pork Tarragon-Meat, poultry, fish, eggs, butter, vegetables (licorice-like flavor)  Thyme-Meat, poultry, fish, eggs, vegetables, (clover-like flavor), sauces, soups Tumeric-Salads, butter, eggs, fish, rice, vegetables (saffron-like flavor) Vanilla Extract-Baked goods, candy Vinegar-Salads, vegetables, meat marinades Walnut Extract-baked goods, candy  2. Choose your Foods Wisely   The following is a list of foods to avoid which are high in sodium:  Meats-Avoid all smoked, canned, salt cured, dried and kosher meat and fish as well as Anchovies   [employer] meats:Bologna, Liverwurst, Pastrami Canned meat or fish  Marinated herring Caviar    Pepperoni Corned Beef   Pizza Dried chipped beef  Salami Frozen breaded fish or meat Salt  pork Frankfurters or hot dogs  Sardines Gefilte fish   Sausage Ham (boiled ham, Proscuitto Smoked butt    spiced ham)   Spam  TV Dinners Vegetables Canned vegetables (Regular) Relish Canned mushrooms  Sauerkraut Olives    Tomato juice Pickles  Bakery and Dessert Products Canned puddings  Cream pies Cheesecake   Decorated cakes Cookies  Beverages/Juices Tomato juice, regular  Gatorade   V-8 vegetable juice, regular  Breads and Cereals Biscuit mixes   Salted potato chips, corn chips, pretzels Bread stuffing mixes  Salted crackers and rolls Pancake and waffle mixes Self-rising flour  Seasonings Accent    Meat sauces Barbecue sauce  Meat tenderizer Catsup    Monosodium glutamate (MSG) Celery salt   Onion salt Chili sauce   Prepared mustard Garlic salt   Salt, seasoned salt, sea salt Gravy mixes   Soy sauce Horseradish   Steak sauce Ketchup   Tartar sauce Lite salt    Teriyaki sauce Marinade mixes   Worcestershire sauce  Others Baking powder   Cocoa and cocoa mixes Baking soda   Commercial casserole mixes Candy-caramels, chocolate  Dehydrated soups    Bars, fudge,nougats  Instant rice and pasta mixes Canned broth or soup  Maraschino cherries Cheese, aged and processed cheese and cheese spreads  Learning Assessment Quiz  Indicated T (for True) or F (for False) for each of the following statements:  1. _____ Fresh fruits and vegetables and unprocessed grains are generally low in sodium 2. _____ Water may contain a considerable amount of sodium, depending on the source 3. _____ You can always tell if a food is high in sodium by tasting it 4. _____ Certain laxatives my be high in sodium and should be avoided unless prescribed   by a physician or pharmacist 5. _____ Salt substitutes may be used freely by anyone on a sodium restricted diet 6. _____ Sodium is present in table salt, food additives and as a natural component of   most foods 7. _____ Table salt is  approximately 90% sodium 8. _____ Limiting sodium intake may help prevent excess fluid accumulation in the body 9. _____ On a sodium-restricted diet, seasonings such as bouillon soy sauce, and    cooking wine should be used in place of table salt 10. _____ On an ingredient list, a product which lists monosodium glutamate as the first   ingredient is an appropriate food to include on a low sodium diet  Circle the best answer(s) to the following statements (Hint: there may be more than one correct answer)  11. On a low-sodium diet, some acceptable snack items are:    A. Olives  F. Bean dip   K. Grapefruit juice    B. Salted Pretzels G. Commercial Popcorn   L. Canned peaches    C. Carrot Sticks  H. Bouillon   M. Unsalted nuts   D. [place] fries  I. Peanut butter crackers N. Salami   E. Sweet pickles J. Tomato Juice   O. Pizza  12.  Seasonings that may be used freely on a reduced - sodium diet include   A. Lemon wedges F.Monosodium glutamate K. Celery seed    B.Soysauce   G. Pepper   L. Mustard powder   C. Sea salt  H. Cooking wine  M. Onion flakes   D. Vinegar  E. Prepared horseradish N. Salsa   E. Sage   J. Worcestershire sauce  O. Chutney     Signed, Ermalinda Barrios, PA-C  09/23/2018 1:33 PM    [facility] [place], [place], [place]  [address] Phone: [phone]; Fax: [phone]

## 2018-09-23 NOTE — Patient Instructions (Signed)
Medication Instructions:  Your physician recommends that you continue on your current medications as directed. Please refer to the Current Medication list given to you today.  If you need a refill on your cardiac medications before your next appointment, please call your pharmacy.   Lab work: Your physician recommends that you return for a FASTING lipid profile, basic metabolic panel, and liver function panel on 10/08/18  If you have labs (blood work) drawn today and your tests are completely normal, you will receive your results only by: Marland Kitchen MyChart Message (if you have MyChart) OR . A paper copy in the mail If you have any lab test that is abnormal or we need to change your treatment, we will call you to review the results.  Testing/Procedures: Your physician has requested that you have an echocardiogram on 10/08/18. Please arrive at 7:00 AM. Echocardiography is a painless test that uses sound waves to create images of your heart. It provides your doctor with information about the size and shape of your heart and how well your heart's chambers and valves are working. This procedure takes approximately one hour. There are no restrictions for this procedure.    Follow-Up: At [facility], you and your health needs are our priority.  As part of our continuing mission to provide you with exceptional heart care, we have created designated Provider Care Teams.  These Care Teams include your primary Cardiologist (physician) and Advanced Practice Providers (APPs -  Physician Assistants and Nurse Practitioners) who all work together to provide you with the care you need, when you need it. . You will need a follow up appointment in 1 year.  Please call our office 2 months in advance to schedule this appointment.  You may see Darlina Guys, MD or one of the following Advanced Practice Providers on your designated Care Team:   . Lyda Jester, PA-C . Dayna Dunn, PA-C . Ermalinda Barrios, PA-C  Any  Other Special Instructions Will Be Listed Below (If Applicable).   Echocardiogram An echocardiogram is a procedure that uses painless sound waves (ultrasound) to produce an image of the heart. Images from an echocardiogram can provide important information about:  Signs of coronary artery disease (CAD).  Aneurysm detection. An aneurysm is a weak or damaged part of an artery wall that bulges out from the normal force of blood pumping through the body.  Heart size and shape. Changes in the size or shape of the heart can be associated with certain conditions, including heart failure, aneurysm, and CAD.  Heart muscle function.  Heart valve function.  Signs of a past heart attack.  Fluid buildup around the heart.  Thickening of the heart muscle.  A tumor or infectious growth around the heart valves. Tell a health care provider about:  Any allergies you have.  All medicines you are taking, including vitamins, herbs, eye drops, creams, and over-the-counter medicines.  Any blood disorders you have.  Any surgeries you have had.  Any medical conditions you have.  Whether you are pregnant or may be pregnant. What are the risks? Generally, this is a safe procedure. However, problems may occur, including:  Allergic reaction to dye (contrast) that may be used during the procedure. What happens before the procedure? No specific preparation is needed. You may eat and drink normally. What happens during the procedure?   An IV tube may be inserted into one of your veins.  You may receive contrast through this tube. A contrast is an injection that improves  the quality of the pictures from your heart.  A gel will be applied to your chest.  A wand-like tool (transducer) will be moved over your chest. The gel will help to transmit the sound waves from the transducer.  The sound waves will harmlessly bounce off of your heart to allow the heart images to be captured in real-time motion.  The images will be recorded on a computer. The procedure may vary among health care providers and hospitals. What happens after the procedure?  You may return to your normal, everyday life, including diet, activities, and medicines, unless your health care provider tells you not to do that. Summary  An echocardiogram is a procedure that uses painless sound waves (ultrasound) to produce an image of the heart.  Images from an echocardiogram can provide important information about the size and shape of your heart, heart muscle function, heart valve function, and fluid buildup around your heart.  You do not need to do anything to prepare before this procedure. You may eat and drink normally.  After the echocardiogram is completed, you may return to your normal, everyday life, unless your health care provider tells you not to do that. This information is not intended to replace advice given to you by your health care provider. Make sure you discuss any questions you have with your health care provider. Document Released: 07/27/2000 Document Revised: 09/01/2016 Document Reviewed: 09/01/2016 Elsevier Interactive Patient Education  2019 [employer].  Two Gram Sodium Diet 2000 mg  What is Sodium? Sodium is a mineral found naturally in many foods. The most significant source of sodium in the diet is table salt, which is about 40% sodium.  Processed, convenience, and preserved foods also contain a large amount of sodium.  The body needs only 500 mg of sodium daily to function,  A normal diet provides more than enough sodium even if you do not use salt.  Why Limit Sodium? A build up of sodium in the body can cause thirst, increased blood pressure, shortness of breath, and water retention.  Decreasing sodium in the diet can reduce edema and risk of heart attack or stroke associated with high blood pressure.  Keep in mind that there are many other factors involved in these health problems.  Heredity,  obesity, lack of exercise, cigarette smoking, stress and what you eat all play a role.  General Guidelines:  Do not add salt at the table or in cooking.  One teaspoon of salt contains over 2 grams of sodium.  Read food labels  Avoid processed and convenience foods  Ask your dietitian before eating any foods not dicussed in the menu planning guidelines  Consult your physician if you wish to use a salt substitute or a sodium containing medication such as antacids.  Limit milk and milk products to 16 oz (2 cups) per day.  Shopping Hints:  READ LABELS!! "Dietetic" does not necessarily mean low sodium.  Salt and other sodium ingredients are often added to foods during processing.   Menu Planning Guidelines Food Group Choose More Often Avoid  Beverages (see also the milk group All fruit juices, low-sodium, salt-free vegetables juices, low-sodium carbonated beverages Regular vegetable or tomato juices, commercially softened water used for drinking or cooking  Breads and Cereals Enriched white, wheat, rye and pumpernickel bread, hard rolls and dinner rolls; muffins, cornbread and waffles; most dry cereals, cooked cereal without added salt; unsalted crackers and breadsticks; low sodium or homemade bread crumbs Bread, rolls and crackers with salted  tops; quick breads; instant hot cereals; pancakes; commercial bread stuffing; self-rising flower and biscuit mixes; regular bread crumbs or cracker crumbs  Desserts and Sweets Desserts and sweets mad with mild should be within allowance Instant pudding mixes and cake mixes  Fats Butter or margarine; vegetable oils; unsalted salad dressings, regular salad dressings limited to 1 Tbs; light, sour and heavy cream Regular salad dressings containing bacon fat, bacon bits, and salt pork; snack dips made with instant soup mixes or processed cheese; salted nuts  Fruits Most fresh, frozen and canned fruits Fruits processed with salt or sodium-containing ingredient  (some dried fruits are processed with sodium sulfites        Vegetables Fresh, frozen vegetables and low- sodium canned vegetables Regular canned vegetables, sauerkraut, pickled vegetables, and others prepared in brine; frozen vegetables in sauces; vegetables seasoned with ham, bacon or salt pork  Condiments, Sauces, Miscellaneous  Salt substitute with physician's approval; pepper, herbs, spices; vinegar, lemon or lime juice; hot pepper sauce; garlic powder, onion powder, low sodium soy sauce (1 Tbs.); low sodium condiments (ketchup, chili sauce, mustard) in limited amounts (1 tsp.) fresh ground horseradish; unsalted tortilla chips, pretzels, potato chips, popcorn, salsa (1/4 cup) Any seasoning made with salt including garlic salt, celery salt, onion salt, and seasoned salt; sea salt, rock salt, kosher salt; meat tenderizers; monosodium glutamate; mustard, regular soy sauce, barbecue, sauce, chili sauce, teriyaki sauce, steak sauce, Worcestershire sauce, and most flavored vinegars; canned gravy and mixes; regular condiments; salted snack foods, olives, picles, relish, horseradish sauce, catsup   Food preparation: Try these seasonings Meats:    Pork Sage, onion Serve with applesauce  Chicken Poultry seasoning, thyme, parsley Serve with cranberry sauce  Lamb Curry powder, rosemary, garlic, thyme Serve with mint sauce or jelly  Veal Marjoram, basil Serve with current jelly, cranberry sauce  Beef Pepper, bay leaf Serve with dry mustard, unsalted chive butter  Fish Bay leaf, dill Serve with unsalted lemon butter, unsalted parsley butter  Vegetables:    Asparagus Lemon juice   Broccoli Lemon juice   Carrots Mustard dressing parsley, mint, nutmeg, glazed with unsalted butter and sugar   Green beans Marjoram, lemon juice, nutmeg,dill seed   Tomatoes Basil, marjoram, onion   Spice /blend for [employer]" 4 tsp ground thyme 1 tsp ground sage 3 tsp ground rosemary 4 tsp ground marjoram   Test your  knowledge 1. A product that says "Salt Free" may still contain sodium. True or False 2. Garlic Powder and Hot Pepper Sauce an be used as alternative seasonings.True or False 3. Processed foods have more sodium than fresh foods.  True or False 4. Canned Vegetables have less sodium than froze True or False  WAYS TO DECREASE YOUR SODIUM INTAKE 1. Avoid the use of added salt in cooking and at the table.  Table salt (and other prepared seasonings which contain salt) is probably one of the greatest sources of sodium in the diet.  Unsalted foods can gain flavor from the sweet, sour, and butter taste sensations of herbs and spices.  Instead of using salt for seasoning, try the following seasonings with the foods listed.  Remember: how you use them to enhance natural food flavors is limited only by your creativity... Allspice-Meat, fish, eggs, fruit, peas, red and yellow vegetables Almond Extract-Fruit baked goods Anise Seed-Sweet breads, fruit, carrots, beets, cottage cheese, cookies (tastes like licorice) Basil-Meat, fish, eggs, vegetables, rice, vegetables salads, soups, sauces Bay Leaf-Meat, fish, stews, poultry Burnet-Salad, vegetables (cucumber-like flavor) Caraway Seed-Bread, cookies,  cottage cheese, meat, vegetables, cheese, rice Cardamon-Baked goods, fruit, soups Celery Powder or seed-Salads, salad dressings, sauces, meatloaf, soup, bread.Do not use  celery salt Chervil-Meats, salads, fish, eggs, vegetables, cottage cheese (parsley-like flavor) Chili Power-Meatloaf, chicken cheese, corn, eggplant, egg dishes Chives-Salads cottage cheese, egg dishes, soups, vegetables, sauces Cilantro-Salsa, casseroles Cinnamon-Baked goods, fruit, pork, lamb, chicken, carrots Cloves-Fruit, baked goods, fish, pot roast, green beans, beets, carrots Coriander-Pastry, cookies, meat, salads, cheese (lemon-orange flavor) Cumin-Meatloaf, fish,cheese, eggs, cabbage,fruit pie (caraway flavor) [employer], fruit,  eggs, fish, poultry, cottage cheese, vegetables Dill Seed-Meat, cottage cheese, poultry, vegetables, fish, salads, bread Fennel Seed-Bread, cookies, apples, pork, eggs, fish, beets, cabbage, cheese, Licorice-like flavor Garlic-(buds or powder) Salads, meat, poultry, fish, bread, butter, vegetables, potatoes.Do not  use garlic salt Ginger-Fruit, vegetables, baked goods, meat, fish, poultry Horseradish Root-Meet, vegetables, butter Lemon Juice or Extract-Vegetables, fruit, tea, baked goods, fish salads Mace-Baked goods fruit, vegetables, fish, poultry (taste like nutmeg) Maple Extract-Syrups Marjoram-Meat, chicken, fish, vegetables, breads, green salads (taste like Sage) Mint-Tea, lamb, sherbet, vegetables, desserts, carrots, cabbage Mustard, Dry or Seed-Cheese, eggs, meats, vegetables, poultry Nutmeg-Baked goods, fruit, chicken, eggs, vegetables, desserts Onion Powder-Meat, fish, poultry, vegetables, cheese, eggs, bread, rice salads (Do not use   Onion salt) Orange Extract-Desserts, baked goods Oregano-Pasta, eggs, cheese, onions, pork, lamb, fish, chicken, vegetables, green salads Paprika-Meat, fish, poultry, eggs, cheese, vegetables Parsley Flakes-Butter, vegetables, meat fish, poultry, eggs, bread, salads (certain forms may   Contain sodium Pepper-Meat fish, poultry, vegetables, eggs Peppermint Extract-Desserts, baked goods Poppy Seed-Eggs, bread, cheese, fruit dressings, baked goods, noodles, vegetables, cottage  [employer], poultry, meat, fish, cauliflower, turnips,eggs bread Saffron-Rice, bread, veal, chicken, fish, eggs Sage-Meat, fish, poultry, onions, eggplant, tomateos, pork, stews Savory-Eggs, salads, poultry, meat, rice, vegetables, soups, pork Tarragon-Meat, poultry, fish, eggs, butter, vegetables (licorice-like flavor)  Thyme-Meat, poultry, fish, eggs, vegetables, (clover-like flavor), sauces, soups Tumeric-Salads, butter, eggs,  fish, rice, vegetables (saffron-like flavor) Vanilla Extract-Baked goods, candy Vinegar-Salads, vegetables, meat marinades Walnut Extract-baked goods, candy  2. Choose your Foods Wisely   The following is a list of foods to avoid which are high in sodium:  Meats-Avoid all smoked, canned, salt cured, dried and kosher meat and fish as well as Anchovies   [employer] meats:Bologna, Liverwurst, Pastrami Canned meat or fish  Marinated herring Caviar    Pepperoni Corned Beef   Pizza Dried chipped beef  Salami Frozen breaded fish or meat Salt pork Frankfurters or hot dogs  Sardines Gefilte fish   Sausage Ham (boiled ham, Proscuitto Smoked butt    spiced ham)   Spam      TV Dinners Vegetables Canned vegetables (Regular) Relish Canned mushrooms  Sauerkraut Olives    Tomato juice Pickles  Bakery and Dessert Products Canned puddings  Cream pies Cheesecake   Decorated cakes Cookies  Beverages/Juices Tomato juice, regular  Gatorade   V-8 vegetable juice, regular  Breads and Cereals Biscuit mixes   Salted potato chips, corn chips, pretzels Bread stuffing mixes  Salted crackers and rolls Pancake and waffle mixes Self-rising flour  Seasonings Accent    Meat sauces Barbecue sauce  Meat tenderizer Catsup    Monosodium glutamate (MSG) Celery salt   Onion salt Chili sauce   Prepared mustard Garlic salt   Salt, seasoned salt, sea salt Gravy mixes   Soy sauce Horseradish   Steak sauce Ketchup   Tartar sauce Lite salt    Teriyaki sauce Marinade mixes   Worcestershire sauce  Others Baking powder  Cocoa and cocoa mixes Baking soda   Commercial casserole mixes Candy-caramels, chocolate  Dehydrated soups    Bars, fudge,nougats  Instant rice and pasta mixes Canned broth or soup  Maraschino cherries Cheese, aged and processed cheese and cheese spreads  Learning Assessment Quiz  Indicated T (for True) or F (for False) for each of the following  statements:  1. _____ Fresh fruits and vegetables and unprocessed grains are generally low in sodium 2. _____ Water may contain a considerable amount of sodium, depending on the source 3. _____ You can always tell if a food is high in sodium by tasting it 4. _____ Certain laxatives my be high in sodium and should be avoided unless prescribed   by a physician or pharmacist 5. _____ Salt substitutes may be used freely by anyone on a sodium restricted diet 6. _____ Sodium is present in table salt, food additives and as a natural component of   most foods 7. _____ Table salt is approximately 90% sodium 8. _____ Limiting sodium intake may help prevent excess fluid accumulation in the body 9. _____ On a sodium-restricted diet, seasonings such as bouillon soy sauce, and    cooking wine should be used in place of table salt 10. _____ On an ingredient list, a product which lists monosodium glutamate as the first   ingredient is an appropriate food to include on a low sodium diet  Circle the best answer(s) to the following statements (Hint: there may be more than one correct answer)  11. On a low-sodium diet, some acceptable snack items are:    A. Olives  F. Bean dip   K. Grapefruit juice    B. Salted Pretzels G. Commercial Popcorn   L. Canned peaches    C. Carrot Sticks  H. Bouillon   M. Unsalted nuts   D. [place] fries  I. Peanut butter crackers N. Salami   E. Sweet pickles J. Tomato Juice   O. Pizza  12.  Seasonings that may be used freely on a reduced - sodium diet include   A. Lemon wedges F.Monosodium glutamate K. Celery seed    B.Soysauce   G. Pepper   L. Mustard powder   C. Sea salt  H. Cooking wine  M. Onion flakes   D. Vinegar  E. Prepared horseradish N. Salsa   E. Sage   J. Worcestershire sauce  O. Chutney

## 2018-09-24 NOTE — Addendum Note (Signed)
Addended by: [name] R on: 09/24/2018 01:43 PM   Modules accepted: Orders

## 2018-10-07 ENCOUNTER — Ambulatory Visit (INDEPENDENT_AMBULATORY_CARE_PROVIDER_SITE_OTHER): Payer: BC Managed Care – PPO | Admitting: Family Medicine

## 2018-10-07 DIAGNOSIS — I5032 Chronic diastolic (congestive) heart failure: Secondary | ICD-10-CM | POA: Diagnosis not present

## 2018-10-07 DIAGNOSIS — E785 Hyperlipidemia, unspecified: Secondary | ICD-10-CM | POA: Diagnosis not present

## 2018-10-07 DIAGNOSIS — Z113 Encounter for screening for infections with a predominantly sexual mode of transmission: Secondary | ICD-10-CM | POA: Diagnosis not present

## 2018-10-07 DIAGNOSIS — I25810 Atherosclerosis of coronary artery bypass graft(s) without angina pectoris: Secondary | ICD-10-CM

## 2018-10-07 DIAGNOSIS — N182 Chronic kidney disease, stage 2 (mild): Secondary | ICD-10-CM

## 2018-10-08 ENCOUNTER — Other Ambulatory Visit: Payer: Self-pay

## 2018-10-08 ENCOUNTER — Encounter: Payer: Self-pay | Admitting: Family Medicine

## 2018-10-08 ENCOUNTER — Ambulatory Visit (HOSPITAL_COMMUNITY): Payer: BC Managed Care – PPO | Attending: Internal Medicine

## 2018-10-08 ENCOUNTER — Other Ambulatory Visit: Payer: BC Managed Care – PPO

## 2018-10-08 DIAGNOSIS — I5032 Chronic diastolic (congestive) heart failure: Secondary | ICD-10-CM | POA: Diagnosis present

## 2018-10-08 DIAGNOSIS — E785 Hyperlipidemia, unspecified: Secondary | ICD-10-CM

## 2018-10-08 LAB — HEPATIC FUNCTION PANEL
ALT: 24 IU/L (ref 0–44)
AST: 23 IU/L (ref 0–40)
Albumin: 4.3 g/dL (ref 3.8–4.8)
Alkaline Phosphatase: 58 IU/L (ref 39–117)
BILIRUBIN, DIRECT: 0.12 mg/dL (ref 0.00–0.40)
Bilirubin Total: 0.4 mg/dL (ref 0.0–1.2)
Total Protein: 6.4 g/dL (ref 6.0–8.5)

## 2018-10-08 LAB — BASIC METABOLIC PANEL
BUN/Creatinine Ratio: 12 (ref 10–24)
BUN: 15 mg/dL (ref 8–27)
CHLORIDE: 98 mmol/L (ref 96–106)
CO2: 28 mmol/L (ref 20–29)
Calcium: 9.1 mg/dL (ref 8.6–10.2)
Creatinine, Ser: 1.25 mg/dL (ref 0.76–1.27)
GFR calc Af Amer: 71 mL/min/{1.73_m2} (ref >59)
GFR calc non Af Amer: 62 mL/min/{1.73_m2} (ref >59)
Glucose: 122 mg/dL — ABNORMAL HIGH (ref 65–99)
Potassium: 5 mmol/L (ref 3.5–5.2)
Sodium: 138 mmol/L (ref 134–144)

## 2018-10-08 LAB — RPR: RPR Ser Ql: NONREACTIVE

## 2018-10-08 LAB — LIPID PANEL
CHOL/HDL RATIO: 3.4 ratio (ref 0.0–5.0)
Cholesterol, Total: 106 mg/dL (ref 100–199)
HDL: 31 mg/dL — ABNORMAL LOW (ref >39)
LDL Calculated: 43 mg/dL (ref 0–99)
Triglycerides: 162 mg/dL — ABNORMAL HIGH (ref 0–149)
VLDL Cholesterol Cal: 32 mg/dL (ref 5–40)

## 2018-10-08 LAB — HIV ANTIBODY (ROUTINE TESTING W REFLEX): HIV Screen 4th Generation wRfx: NONREACTIVE

## 2018-10-09 ENCOUNTER — Other Ambulatory Visit: Payer: Self-pay

## 2018-10-09 ENCOUNTER — Encounter: Payer: Self-pay | Admitting: Emergency Medicine

## 2018-10-09 ENCOUNTER — Ambulatory Visit: Payer: BC Managed Care – PPO | Admitting: Emergency Medicine

## 2018-10-09 VITALS — BP 116/70 | HR 55 | Temp 98.7°F | Resp 16 | Ht 67.0 in | Wt 181.8 lb

## 2018-10-09 DIAGNOSIS — L089 Local infection of the skin and subcutaneous tissue, unspecified: Secondary | ICD-10-CM | POA: Diagnosis not present

## 2018-10-09 DIAGNOSIS — R234 Changes in skin texture: Secondary | ICD-10-CM | POA: Diagnosis not present

## 2018-10-09 LAB — GC/CHLAMYDIA PROBE AMP
Chlamydia trachomatis, NAA: NEGATIVE
Neisseria gonorrhoeae by PCR: NEGATIVE

## 2018-10-09 MED ORDER — MUPIROCIN 2 % EX OINT: TOPICAL_OINTMENT | CUTANEOUS | 1 refills | Status: DC

## 2018-10-09 MED ORDER — CEPHALEXIN 500 MG PO CAPS: 500.0000 mg | ORAL_CAPSULE | Freq: Three times a day (TID) | ORAL | 0 refills | Status: AC

## 2018-10-09 NOTE — Patient Instructions (Addendum)
If you have lab work done today you will be contacted with your lab results within the next 2 weeks.  If you have not heard from [place] then please contact us. The fastest way to get your results is to register for My Chart.   IF you received an x-ray today, you will receive an invoice from [facility]. Please contact [facility] at [phone] with questions or concerns regarding your invoice.   IF you received labwork today, you will receive an invoice from [facility]. Please contact [facility] at [phone] with questions or concerns regarding your invoice.   Our billing staff will not be able to assist you with questions regarding bills from these companies.  You will be contacted with the lab results as soon as they are available. The fastest way to get your results is to activate your My Chart account. Instructions are located on the last page of this paperwork. If you have not heard from [place] regarding the results in 2 weeks, please contact this office.     Wound Infection  A wound infection happens when germs start to grow in the wound. Germs that cause wound infections are most often bacteria. Other types of infections can occur as well. In some cases, infection can cause the wound to break open. Wound infections need treatment. If a wound infection is not treated, complications can happen. Follow these instructions at home: Medicines  Take or apply over-the-counter and prescription medicines only as told by your doctor.  If you were prescribed antibiotic medicine, take or apply it as told by your doctor. Do not stop using the antibiotic even if your condition improves. Wound care  Clean the wound each day or as told by your doctor. ? Wash the wound with mild soap and water. ? Rinse the wound with water to remove all soap. ? Pat the wound dry with a clean towel. Do not rub it.  Follow instructions from your doctor about how to take care of your wound. Make  sure you: ? Wash your hands with soap and water before you change your bandage (dressing). If you cannot use soap and water, use hand sanitizer. ? Change your bandage as told by your doctor. ? Leave stitches (sutures), skin glue, or skin tape (adhesive) strips in place if your wound has been closed. They may need to stay in place for 2 weeks or longer. If tape strips get loose and curl up, you may trim the loose edges. Do not remove tape strips completely unless your doctor says it is okay. Some wounds are left open to heal on their own.  Check your wound every day for signs of infection. Watch for: ? More redness, swelling, or pain. ? More fluid or blood. ? Warmth. ? Pus or a bad smell. General instructions  Keep the bandage dry until your doctor says it can be removed.  Do not take baths, swim, use a hot tub, or do anything that would put your wound underwater until your doctor says it is okay.  Raise (elevate) the injured area above the level of your heart while you are sitting or lying down.  Do not scratch or pick at the wound.  Keep all follow-up visits as told by your doctor. This is important. Contact a doctor if:  Medicine does not help your pain.  You have more redness, swelling, or pain in the area of your wound.  You have more fluid or blood coming from your wound.  Your wound feels warm to the touch.  You have pus coming from your wound.  You continue to notice a bad smell coming from your wound or your bandage.  Your wound that was closed breaks open. Get help right away if:  You have a red streak going away from your wound.  You have a fever. This information is not intended to replace advice given to you by your health care provider. Make sure you discuss any questions you have with your health care provider. Document Released: 05/08/2008 Document Revised: 01/05/2016 Document Reviewed: 01/17/2015 Elsevier Interactive Patient Education  2019 [employer].

## 2018-10-09 NOTE — Progress Notes (Signed)
Jeremy Hendricks 63 y.o.   Chief Complaint  Patient presents with  . Cyst    on tailbone x 3-4 days    HISTORY OF PRESENT ILLNESS: This is a 63 y.o. male complaining of painful and tender pilonidal area for the past 3 to 4 days.  No other significant symptoms.  HPI   Prior to Admission medications   Medication Sig Start Date End Date Taking? Authorizing Provider  aspirin EC 81 MG tablet Take 81 mg daily by mouth.   Yes [provider]  atenolol (TENORMIN) 25 MG tablet TAKE 1 TABLET BY MOUTH EVERY DAY 07/04/18  Yes Dunn, Dayna N, PA-C  furosemide (LASIX) 40 MG tablet TAKE 1 TABLET BY MOUTH DAILY AND 1/2 TABLET BY MOUTH DAILY AS NEEDED FOR SWELLING 09/10/18  Yes Dunn, Dayna N, PA-C  Multiple Vitamin (MULTIVITAMIN) tablet Take 1 tablet by mouth daily.     Yes [provider]  nitroGLYCERIN (NITROSTAT) 0.4 MG SL tablet Place 0.4 mg under the tongue every 5 (five) minutes as needed for chest pain. Reported on 09/21/2015   Yes [provider]  predniSONE (DELTASONE) 20 MG tablet Take 2 tablets (40 mg total) by mouth daily with breakfast. 09/20/18  Yes Wendie Agreste, MD  rosuvastatin (CRESTOR) 40 MG tablet TAKE 1 TABLET BY MOUTH EVERY DAY 09/08/18  Yes Dunn, Nedra Hai, PA-C  Testosterone 30 MG/ACT SOLN PLACE 30MG  ONTO THE SKIN EVERY MORNING 05/23/18  Yes Wendie Agreste, MD  TRUVADA 200-300 MG tablet TAKE ONE TABLET BY MOUTH ONCE DAILY WITH OR WITHOUT FOOD. STORE IN ORIGINAL CONTAINER AT ROOM TEMPERATURE. 09/23/18  Yes Wendie Agreste, MD    No Known Allergies  Patient Active Problem List   Diagnosis Date Noted  . Chronic diastolic CHF (congestive heart failure) ([place]) 09/23/2018  . CKD (chronic kidney disease) stage 2, GFR 60-89 ml/min 09/23/2018  . History of ETT   . Gout   . DJD (degenerative joint disease)   . Coronary artery disease   . Atrial fibrillation ([place])   . 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 HLD (hyperlipidemia) 06/11/2016  . Hypogonadism in male  09/09/2015  . INSOMNIA 09/19/2010  . SHORTNESS OF BREATH 09/19/2010  . CHEST PAIN-PRECORDIAL 08/23/2010  . CAD, ARTERY BYPASS GRAFT 10/26/2009  . HYPERLIPIDEMIA 10/24/2009  . DEGENERATIVE JOINT DISEASE 10/24/2009  . ANGINA, HX OF 10/24/2009    Past Medical History:  Diagnosis Date  . Atrial fibrillation ([place])    postoperative  . Chronic diastolic CHF (congestive heart failure) ([place])   . CKD (chronic kidney disease), stage II   . Coronary artery disease    a. s/p CABG 2002. b. s/p redo 2012.  Marland Kitchen DJD (degenerative joint disease)   . Gout   . History of ETT    a. ETT 6/16:  normal    Past Surgical History:  Procedure Laterality Date  . CORONARY ARTERY BYPASS GRAFT  2002  . CORONARY ARTERY BYPASS GRAFT  08-2010   L-LAD remained from original CABG; new grafts incl L radial- PDA + RIMA-RI  . VASECTOMY      Social History   Socioeconomic History  . Marital status: Single    Spouse name: Not on file  . Number of children: 2  . Years of education: Not on file  . Highest education level: Not on file  Occupational History  . Occupation: Cabin crew  Social Needs  . Financial resource strain: Not on file  . Food insecurity:    Worry: Not  on file    Inability: Not on file  . Transportation needs:    Medical: Not on file    Non-medical: Not on file  Tobacco Use  . Smoking status: Former Smoker    Last attempt to quit: 08/14/1999    Years since quitting: 19.1  . Smokeless tobacco: Never Used  Substance and Sexual Activity  . Alcohol use: Yes    Alcohol/week: 14.0 standard drinks    Types: 14 Standard drinks or equivalent per week  . Drug use: No  . Sexual activity: Yes  Lifestyle  . Physical activity:    Days per week: Not on file    Minutes per session: Not on file  . Stress: Not on file  Relationships  . Social connections:    Talks on phone: Not on file    Gets together: Not on file    Attends religious service: Not on file    Active member of club or organization:  Not on file    Attends meetings of clubs or organizations: Not on file    Relationship status: Not on file  . Intimate partner violence:    Fear of current or ex partner: Not on file    Emotionally abused: Not on file    Physically abused: Not on file    Forced sexual activity: Not on file  Other Topics Concern  . Not on file  Social History Narrative   Single   Education: College   Exercise: Yes    Family History  Problem Relation Age of Onset  . Heart attack Father 72  . Hypertension Father   . Cancer Mother        Breast cancer  . Cancer Unknown        family hx of  . Coronary artery disease Unknown        family hx of  . Hyperlipidemia Unknown        family hx of  . Hypertension Paternal Grandfather   . Stroke Neg Hx   . Colon cancer Neg Hx      Review of Systems  Constitutional: Negative.  Negative for chills and fever.  Respiratory: Negative for shortness of breath.   Gastrointestinal: Negative for nausea and vomiting.  Genitourinary: Negative for dysuria.  Skin: Positive for rash.  Neurological: Negative for dizziness and headaches.  All other systems reviewed and are negative.   Vitals:   10/09/18 1503  BP: 116/70  Pulse: (!) 55  Resp: 16  Temp: 98.7 F (37.1 C)  SpO2: 97%    Physical Exam Constitutional:      Appearance: Normal appearance.  HENT:     Head: Normocephalic.  Eyes:     Extraocular Movements: Extraocular movements intact.     Pupils: Pupils are equal, round, and reactive to light.  Cardiovascular:     Rate and Rhythm: Normal rate and regular rhythm.  Pulmonary:     Effort: Pulmonary effort is normal.     Breath sounds: Normal breath sounds.  Abdominal:     General: Bowel sounds are normal.     Palpations: Abdomen is soft.  Musculoskeletal: Normal range of motion.  Skin:    General: Skin is warm and dry.     Comments: Skin between buttocks shows 3 partial thickness fissures with erythema and tenderness to palpation.  They  look infected.  No pilonidal abscess detected.  Neurological:     General: No focal deficit present.     Mental Status: He is  alert and oriented to person, place, and time.      ASSESSMENT & PLAN: Jeremy Hendricks was seen today for cyst.  Diagnoses and all orders for this visit:  Skin fissures -     cephALEXin (KEFLEX) 500 MG capsule; Take 1 capsule (500 mg total) by mouth 3 (three) times daily for 7 days. -     mupirocin ointment (BACTROBAN) 2 %; Sig to affected area twice a day x 7 days.  Skin infection -     cephALEXin (KEFLEX) 500 MG capsule; Take 1 capsule (500 mg total) by mouth 3 (three) times daily for 7 days. -     mupirocin ointment (BACTROBAN) 2 %; Sig to affected area twice a day x 7 days.    Patient Instructions       If you have lab work done today you will be contacted with your lab results within the next 2 weeks.  If you have not heard from [place] then please contact us. The fastest way to get your results is to register for My Chart.   IF you received an x-ray today, you will receive an invoice from [facility]. Please contact [facility] at [phone] with questions or concerns regarding your invoice.   IF you received labwork today, you will receive an invoice from [place]. Please contact [facility] at [phone] with questions or concerns regarding your invoice.   Our billing staff will not be able to assist you with questions regarding bills from these companies.  You will be contacted with the lab results as soon as they are available. The fastest way to get your results is to activate your My Chart account. Instructions are located on the last page of this paperwork. If you have not heard from [place] regarding the results in 2 weeks, please contact this office.     Wound Infection  A wound infection happens when germs start to grow in the wound. Germs that cause wound infections are most often bacteria. Other types of infections can occur as  well. In some cases, infection can cause the wound to break open. Wound infections need treatment. If a wound infection is not treated, complications can happen. Follow these instructions at home: Medicines  Take or apply over-the-counter and prescription medicines only as told by your doctor.  If you were prescribed antibiotic medicine, take or apply it as told by your doctor. Do not stop using the antibiotic even if your condition improves. Wound care  Clean the wound each day or as told by your doctor. ? Wash the wound with mild soap and water. ? Rinse the wound with water to remove all soap. ? Pat the wound dry with a clean towel. Do not rub it.  Follow instructions from your doctor about how to take care of your wound. Make sure you: ? Wash your hands with soap and water before you change your bandage (dressing). If you cannot use soap and water, use hand sanitizer. ? Change your bandage as told by your doctor. ? Leave stitches (sutures), skin glue, or skin tape (adhesive) strips in place if your wound has been closed. They may need to stay in place for 2 weeks or longer. If tape strips get loose and curl up, you may trim the loose edges. Do not remove tape strips completely unless your doctor says it is okay. Some wounds are left open to heal on their own.  Check your wound every day for signs of infection. Watch for: ? More redness,  swelling, or pain. ? More fluid or blood. ? Warmth. ? Pus or a bad smell. General instructions  Keep the bandage dry until your doctor says it can be removed.  Do not take baths, swim, use a hot tub, or do anything that would put your wound underwater until your doctor says it is okay.  Raise (elevate) the injured area above the level of your heart while you are sitting or lying down.  Do not scratch or pick at the wound.  Keep all follow-up visits as told by your doctor. This is important. Contact a doctor if:  Medicine does not help your  pain.  You have more redness, swelling, or pain in the area of your wound.  You have more fluid or blood coming from your wound.  Your wound feels warm to the touch.  You have pus coming from your wound.  You continue to notice a bad smell coming from your wound or your bandage.  Your wound that was closed breaks open. Get help right away if:  You have a red streak going away from your wound.  You have a fever. This information is not intended to replace advice given to you by your health care provider. Make sure you discuss any questions you have with your health care provider. Document Released: 05/08/2008 Document Revised: 01/05/2016 Document Reviewed: 01/17/2015 Elsevier Interactive Patient Education  2019 Elsevier Inc.      Agustina Caroli, MD [facility]

## 2018-11-07 IMAGING — DX DG CHEST 2V
2 series · 2 of 2 positions shown · non-contrast
Comparison: 06/11/2016

CLINICAL DATA: Shortness of breath for 2 years

EXAM:
CHEST  2 VIEW

[dg chest 2 view (1 of 2)]
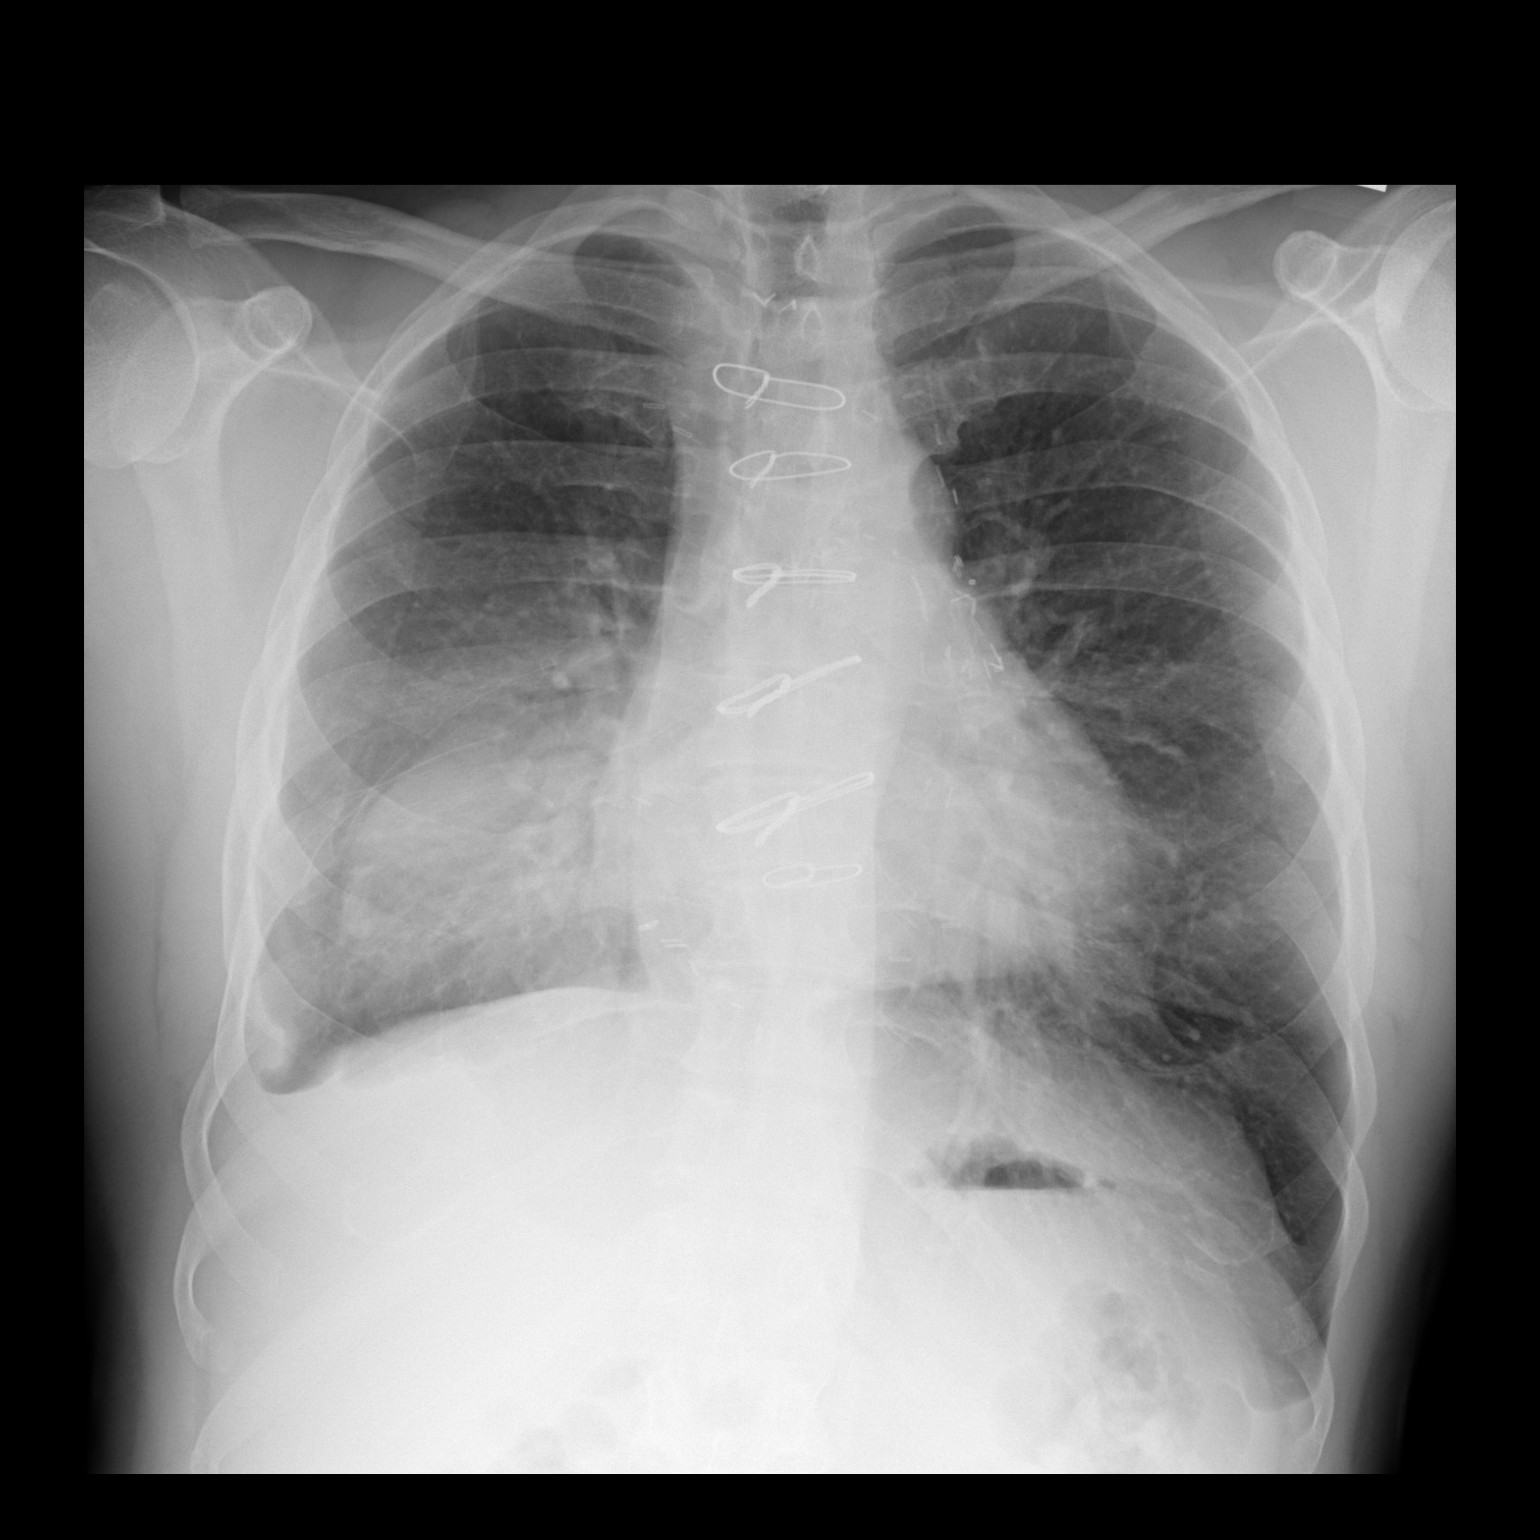

[dg chest 2 view (2 of 2)]
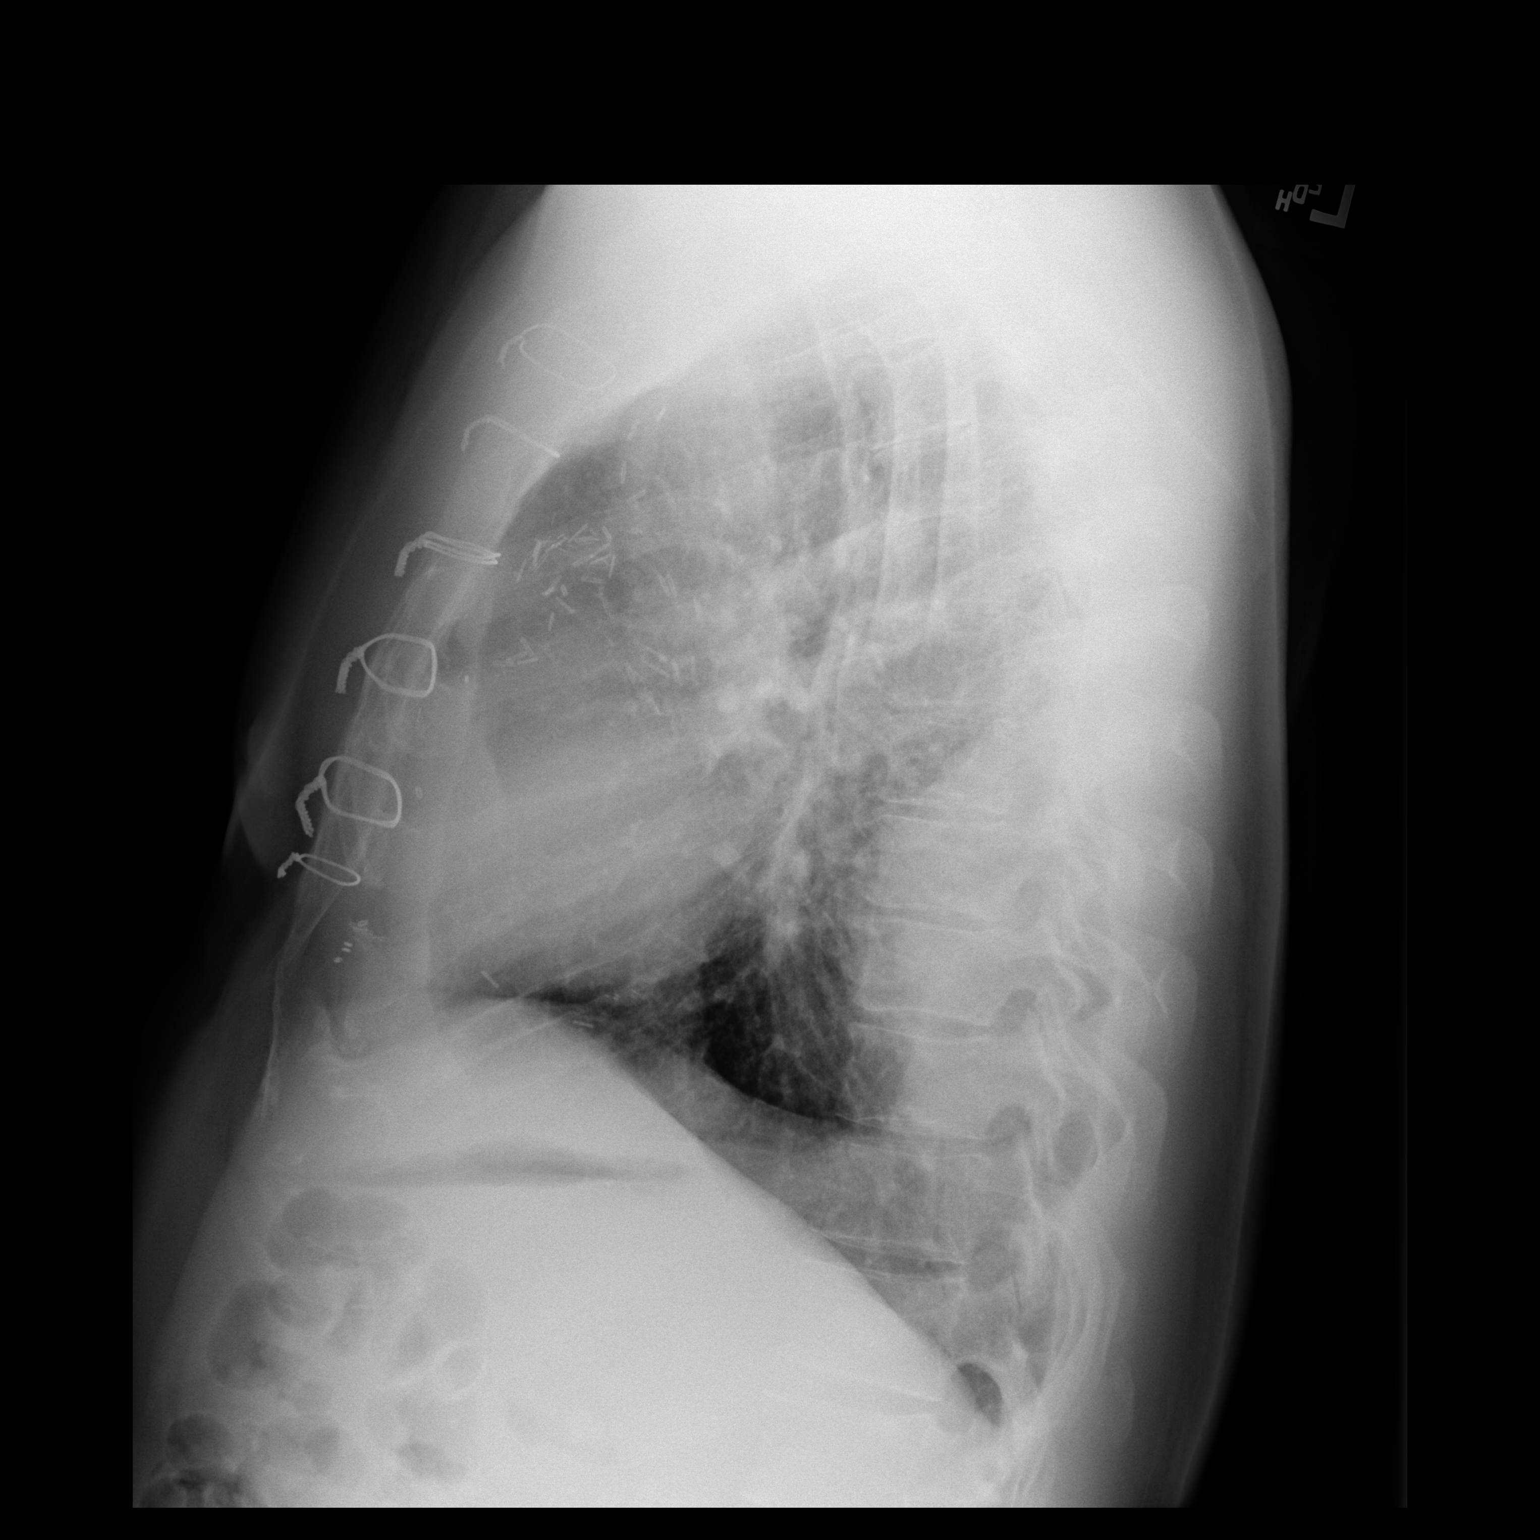

[2 of 2 positions shown; findings below may reference images not displayed]

FINDINGS: Postsurgical changes are again identified and stable. There is
persistent rounded density along the posterior aspect of the right
lower lobe. Blunting of lateral costophrenic angle is noted as well.
The overall appearance is stable from the prior exam. No new focal
infiltrate is seen. No acute bony abnormality is noted.
IMPRESSION: Stable changes along the posterior aspect of the right lower lobe.
This may represent a loculated effusion. No new focal abnormality is
seen. CT of the chest is recommended for confirmation. Given the
stability over 2 years this is likely not an aggressive process.

## 2018-12-03 ENCOUNTER — Encounter: Payer: Self-pay | Admitting: Family Medicine

## 2019-02-27 ENCOUNTER — Other Ambulatory Visit: Payer: Self-pay | Admitting: Physician Assistant

## 2019-03-02 ENCOUNTER — Other Ambulatory Visit: Payer: Self-pay | Admitting: Family Medicine

## 2019-03-02 DIAGNOSIS — Z113 Encounter for screening for infections with a predominantly sexual mode of transmission: Secondary | ICD-10-CM

## 2019-03-02 NOTE — Telephone Encounter (Signed)
Requested medications are due for refill today?  Yes  Requested medications are on the active medication list?  Yes  Last refill 09/23/2018  Future visit scheduled?  No  Requested Prescriptions  Pending Prescriptions Disp Refills   TRUVADA 200-300 MG tablet [Pharmacy Med Name: TRUVADA 200/300MG ] 90 tablet 0    Sig: TAKE ONE TABLET BY MOUTH ONCE DAILY WITH OR WITHOUT FOOD. STORE IN ORIGINAL CONTAINER AT ROOM TEMPERATURE.     Off-Protocol Failed - 03/02/2019  7:24 AM      Failed - Medication not assigned to a protocol, review manually.      Passed - Valid encounter within last 12 months    Recent Outpatient Visits          4 months ago Skin fissures   Primary Care at [facility], [place], [place]   4 months ago Routine screening for STI (sexually transmitted infection)   [facility] at [facility], Lilia Argue, MD   8 months ago Skin lesion of left arm   [facility] at [facility], Laurey Arrow, MD   10 months ago Hypogonadism male   [facility] at [facility], Ranell Patrick, MD   1 year ago Paronychia of finger of right hand   Primary Care at [facility], Gelene Mink, [place]

## 2019-03-03 ENCOUNTER — Other Ambulatory Visit: Payer: Self-pay | Admitting: Family Medicine

## 2019-03-03 DIAGNOSIS — E291 Testicular hypofunction: Secondary | ICD-10-CM

## 2019-03-03 DIAGNOSIS — R7989 Other specified abnormal findings of blood chemistry: Secondary | ICD-10-CM

## 2019-03-03 NOTE — Telephone Encounter (Signed)
Requested medications are due for refill today?  Yes  Requested medications are on the active medication list?  Yes  Last refill 05/23/2018  Future visit scheduled?  No  Notes to clinic  Requested Prescriptions  Pending Prescriptions Disp Refills   Testosterone 30 MG/ACT SOLN [Pharmacy Med Name: TESTOSTERONE 30 MG/1.5 ML PUMP] 90 mL     Sig: PLACE 30MG  (1 PUMP) ONTO THE SKIN EVERY MORNING     Off-Protocol Failed - 03/03/2019 12:27 PM      Failed - Medication not assigned to a protocol, review manually.      Passed - Valid encounter within last 12 months    Recent Outpatient Visits          4 months ago Skin fissures   Primary Care at [facility], [place], [place]   4 months ago Routine screening for STI (sexually transmitted infection)   [facility] at [facility], Lilia Argue, MD   8 months ago Skin lesion of left arm   [facility] at [facility], Laurey Arrow, MD   10 months ago Hypogonadism male   [facility] at [facility], Ranell Patrick, MD   1 year ago Paronychia of finger of right hand   Primary Care at [facility], Gelene Mink, [place]

## 2019-03-05 ENCOUNTER — Encounter: Payer: Self-pay | Admitting: Family Medicine

## 2019-03-09 ENCOUNTER — Telehealth (INDEPENDENT_AMBULATORY_CARE_PROVIDER_SITE_OTHER): Payer: BC Managed Care – PPO | Admitting: Family Medicine

## 2019-03-09 ENCOUNTER — Other Ambulatory Visit: Payer: Self-pay

## 2019-03-09 DIAGNOSIS — E291 Testicular hypofunction: Secondary | ICD-10-CM | POA: Diagnosis not present

## 2019-03-09 DIAGNOSIS — R7989 Other specified abnormal findings of blood chemistry: Secondary | ICD-10-CM

## 2019-03-09 MED ORDER — TESTOSTERONE 30 MG/ACT TD SOLN: 30.0000 mg | Freq: Every day | TRANSDERMAL | 2 refills | Status: DC

## 2019-03-09 NOTE — Patient Instructions (Signed)
° ° ° °  If you have lab work done today you will be contacted with your lab results within the next 2 weeks.  If you have not heard from us then please contact us. The fastest way to get your results is to register for My Chart. ° ° °IF you received an x-ray today, you will receive an invoice from [facility]. Please contact [facility] at [phone] with questions or concerns regarding your invoice.  ° °IF you received labwork today, you will receive an invoice from [facility]. Please contact [facility] at [phone] with questions or concerns regarding your invoice.  ° °Our billing staff will not be able to assist you with questions regarding bills from these companies. ° °You will be contacted with the lab results as soon as they are available. The fastest way to get your results is to activate your My Chart account. Instructions are located on the last page of this paperwork. If you have not heard from us regarding the results in 2 weeks, please contact this office. °  ° ° ° °

## 2019-03-09 NOTE — Progress Notes (Signed)
CC- Medication refill-  Patient need a refill on testosterone cream. Patient is doing well. Patient was thinking his appt was for tomorrow. But mad aware it was today.

## 2019-03-09 NOTE — Progress Notes (Signed)
Virtual Visit via Video Note  I connected with Jeremy Hendricks on 03/09/19 at 6:07 PM by a video enabled telemedicine application [employer] and verified that I am speaking with the correct person using two identifiers.   Chief complaint:  Med refill - testosterone.   Note prepped below, but has in office eval for voice issue only in 3 days. Refilled testosterone and will plan to check labs and discuss items below in detail at that time. 6 min call.   History of Present Illness: Jeremy Hendricks is a 63 y.o. male  Hypogonadism: Last discussed in September 2019.  At that time he was using testosterone 30 mcg per actuation solution 1 pump per day.  Felt like that dose was working at that time.  Last PSA was normal at 1.8 in September 2019.  Normal hepatic function panel in February.  (Hyperglycemia noted at that time with 122, and recommended follow-up with PCP). Triglycerides also elevated slightly at 162 on his lipid panel in February, HDL low at 31, LDL normal at 43.  Recommended to reduce processed and simple sugars with repeat FLP in 6 months.  Lab Results  Component Value Date   WBC 7.8 10/10/2017   HGB 15.0 10/10/2017   HCT 44.6 10/10/2017   MCV 91 10/10/2017   PLT 262 10/10/2017     Preexposure prophylaxis: Treated with Truvada.  Last STI testing in February. Lab Results  Component Value Date   CREATININE 1.25 10/07/2018    Hyperglycemia: As above, glucose 122 on blood work in February.  No recent hemoglobin A1c.  Lab Results  Component Value Date   HGBA1C  08/29/2010    5.5 (NOTE)                                                                       According to the ADA Clinical Practice Recommendations for 2011, when HbA1c is used as a screening test:   >=6.5%   Diagnostic of Diabetes Mellitus           (if abnormal result  is confirmed)  5.7-6.4%   Increased risk of developing Diabetes Mellitus  References:Diagnosis and Classification of Diabetes Mellitus,Diabetes  QAST,4196,22(WLNLG 1):S62-S69 and Standards of Medical Care in         Diabetes - 2011,Diabetes Care,2011,34  (Suppl 1):S11-S61.      Patient Active Problem List   Diagnosis Date Noted  . Skin fissures 10/09/2018  . Skin infection 10/09/2018  . Chronic diastolic CHF (congestive heart failure) ([place]) 09/23/2018  . CKD (chronic kidney disease) stage 2, GFR 60-89 ml/min 09/23/2018  . History of ETT   . Gout   . DJD (degenerative joint disease)   . Coronary artery disease   . Atrial fibrillation ([place])   . 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 HLD (hyperlipidemia) 06/11/2016  . Hypogonadism in male 09/09/2015  . INSOMNIA 09/19/2010  . SHORTNESS OF BREATH 09/19/2010  . CHEST PAIN-PRECORDIAL 08/23/2010  . CAD, ARTERY BYPASS GRAFT 10/26/2009  . HYPERLIPIDEMIA 10/24/2009  . DEGENERATIVE JOINT DISEASE 10/24/2009  . ANGINA, HX OF 10/24/2009   Past Medical History:  Diagnosis Date  . Atrial fibrillation ([place])    postoperative  . Chronic diastolic CHF (congestive heart failure) ([place])   . CKD (chronic  kidney disease), stage II   . Coronary artery disease    a. s/p CABG 2002. b. s/p redo 2012.  Marland Kitchen DJD (degenerative joint disease)   . Gout   . History of ETT    a. ETT 6/16:  normal   Past Surgical History:  Procedure Laterality Date  . CORONARY ARTERY BYPASS GRAFT  2002  . CORONARY ARTERY BYPASS GRAFT  08-2010   L-LAD remained from original CABG; new grafts incl L radial- PDA + RIMA-RI  . VASECTOMY     No Known Allergies Prior to Admission medications   Medication Sig Start Date End Date Taking? Authorizing Provider  aspirin EC 81 MG tablet Take 81 mg daily by mouth.   Yes [provider]  atenolol (TENORMIN) 25 MG tablet TAKE 1 TABLET BY MOUTH EVERY DAY 02/27/19  Yes Dunn, Dayna N, PA-C  furosemide (LASIX) 40 MG tablet TAKE 1 TABLET BY MOUTH DAILY AND 1/2 TABLET BY MOUTH DAILY AS NEEDED FOR SWELLING 09/10/18  Yes Dunn, Dayna N, PA-C  Multiple Vitamin (MULTIVITAMIN) tablet Take 1  tablet by mouth daily.     Yes [provider]  nitroGLYCERIN (NITROSTAT) 0.4 MG SL tablet Place 0.4 mg under the tongue every 5 (five) minutes as needed for chest pain. Reported on 09/21/2015   Yes [provider]  rosuvastatin (CRESTOR) 40 MG tablet TAKE 1 TABLET BY MOUTH EVERY DAY 09/08/18  Yes Dunn, Nedra Hai, PA-C  Testosterone 30 MG/ACT SOLN PLACE 30MG  ONTO THE SKIN EVERY MORNING 05/23/18  Yes Wendie Agreste, MD  TRUVADA 200-300 MG tablet TAKE ONE TABLET BY MOUTH ONCE DAILY WITH OR WITHOUT FOOD. STORE IN ORIGINAL CONTAINER AT ROOM TEMPERATURE. 03/04/19  Yes Wendie Agreste, MD   Social History   Socioeconomic History  . Marital status: Single    Spouse name: Not on file  . Number of children: 2  . Years of education: Not on file  . Highest education level: Not on file  Occupational History  . Occupation: Cabin crew  Social Needs  . Financial resource strain: Not on file  . Food insecurity    Worry: Not on file    Inability: Not on file  . Transportation needs    Medical: Not on file    Non-medical: Not on file  Tobacco Use  . Smoking status: Former Smoker    Quit date: 08/14/1999    Years since quitting: 19.5  . Smokeless tobacco: Never Used  Substance and Sexual Activity  . Alcohol use: Yes    Alcohol/week: 14.0 standard drinks    Types: 14 Standard drinks or equivalent per week  . Drug use: No  . Sexual activity: Yes  Lifestyle  . Physical activity    Days per week: Not on file    Minutes per session: Not on file  . Stress: Not on file  Relationships  . Social Herbalist on phone: Not on file    Gets together: Not on file    Attends religious service: Not on file    Active member of club or organization: Not on file    Attends meetings of clubs or organizations: Not on file    Relationship status: Not on file  . Intimate partner violence    Fear of current or ex partner: Not on file    Emotionally abused: Not on file    Physically  abused: Not on file    Forced sexual activity: Not on file  Other Topics  Concern  . Not on file  Social History Narrative   Single   Education: College   Exercise: Yes    Observations/Objective: No distress, plan reviewed with follow-up in 3 days for further discussion.  Assessment and Plan: Low testosterone - Plan: Testosterone 30 MG/ACT SOLN  Hypogonadism in male - Plan: Testosterone 30 MG/ACT SOLN As above, testosterone refilled temporarily and can be discussed further in office in 3 days with lab work at that time.  Follow Up Instructions: 3 days.  I discussed the assessment and treatment plan with the patient. The patient was provided an opportunity to ask questions and all were answered. The patient agreed with the plan and demonstrated an understanding of the instructions.   The patient was advised to call back or seek an in-person evaluation if the symptoms worsen or if the condition fails to improve as anticipated.  I provided 6 minutes of non-face-to-face time during this encounter.   Wendie Agreste, MD

## 2019-03-12 ENCOUNTER — Other Ambulatory Visit (HOSPITAL_COMMUNITY)
Admission: RE | Admit: 2019-03-12 | Discharge: 2019-03-12 | Disposition: A | Discharge status: Home / Self Care | Payer: BC Managed Care – PPO | Source: Ambulatory Visit | Attending: Family Medicine | Admitting: Family Medicine

## 2019-03-12 ENCOUNTER — Other Ambulatory Visit: Payer: Self-pay

## 2019-03-12 ENCOUNTER — Encounter: Payer: Self-pay | Admitting: Family Medicine

## 2019-03-12 ENCOUNTER — Ambulatory Visit: Payer: BC Managed Care – PPO | Admitting: Family Medicine

## 2019-03-12 VITALS — BP 113/70 | HR 56 | Temp 98.3°F | Resp 14 | Wt 183.2 lb

## 2019-03-12 DIAGNOSIS — Z79899 Other long term (current) drug therapy: Secondary | ICD-10-CM

## 2019-03-12 DIAGNOSIS — R739 Hyperglycemia, unspecified: Secondary | ICD-10-CM | POA: Diagnosis not present

## 2019-03-12 DIAGNOSIS — R7989 Other specified abnormal findings of blood chemistry: Secondary | ICD-10-CM

## 2019-03-12 DIAGNOSIS — Z113 Encounter for screening for infections with a predominantly sexual mode of transmission: Secondary | ICD-10-CM | POA: Insufficient documentation

## 2019-03-12 DIAGNOSIS — E291 Testicular hypofunction: Secondary | ICD-10-CM | POA: Diagnosis not present

## 2019-03-12 DIAGNOSIS — E781 Pure hyperglyceridemia: Secondary | ICD-10-CM

## 2019-03-12 DIAGNOSIS — Z5181 Encounter for therapeutic drug level monitoring: Secondary | ICD-10-CM | POA: Diagnosis not present

## 2019-03-12 DIAGNOSIS — R490 Dysphonia: Secondary | ICD-10-CM

## 2019-03-12 MED ORDER — TRUVADA 200-300 MG PO TABS: ORAL_TABLET | ORAL | 2 refills | Status: DC

## 2019-03-12 MED ORDER — FLUTICASONE PROPIONATE 50 MCG/ACT NA SUSP: 1.0000 | Freq: Every day | NASAL | 6 refills | Status: DC

## 2019-03-12 NOTE — Patient Instructions (Addendum)
Try flonase nasal spray, and pepcid over the counter for the next 2 weeks. If not helping  - I can refer you to ENT specialist. If improving, can try stopping one of the two meds to determine ultimate cause. Info on heartburn below.   No change in other meds. Recheck in 6 months. Sooner if worse symptoms.    Hoarseness  Hoarseness, also called dysphonia, is any abnormal change in your voice that can make it difficult to speak. Your voice may sound raspy, breathy, or strained. Hoarseness is caused by a problem with your vocal cords (vocal folds). These are two bands of tissue inside your voice box (larynx). When you speak, your vocal cords move back and forth to create sound. The surfaces of your vocal cords need to be smooth for your voice to sound clear. Swelling or lumps on your vocal cords can cause hoarseness. Common causes of vocal cord problems include:  Infection in the nose, throat, and upper air passages (upper respiratory infection).  A long-term cough.  Straining or overusing your voice.  Smoking, or exposure to secondhand smoke.  Allergies.  Medication side effects.  Vocal cord growths.  Vocal cord injuries.  Stomach acids that move up in your throat and irritate your vocal cords (gastroesophageal reflux).  Diseases that affect the nervous system, such as a stroke or Parkinson's disease. Follow these instructions at home: Watch your condition for any changes. To ease discomfort and protect your vocal cords:  Rest your voice.  Do not whisper. Whispering can cause muscle strain.  Do not speak in a loud or harsh voice.  Avoid coughing or clearing your throat.  Do not use any products that contain nicotine or tobacco, such as cigarettes and e-cigarettes. If you need help quitting, ask your health care provider.  Avoid secondhand smoke.  Do not eat foods that give you heartburn, such as spicy or acidic foods like hot peppers and orange juice. Heartburn can make  gastroesophageal reflux worse.  Do not drink beverages that contain caffeine (coffee, tea, or soft drinks) or alcohol (beer, wine, or liquor).  Drink enough fluid to keep your urine pale yellow.  Use a humidifier if the air in your home is dry. If recommended by your health care provider, schedule an appointment with a speech-language specialist. This specialist may give you methods to try that can help you avoid misusing your voice. Contact a health care provider if:  You have hoarseness that lasts longer than 3 weeks.  You almost lose or completely lose your voice for more than 3 days.  You have pain when you swallow or try to talk.  You feel a lump in your neck. Get help right away if:  You have trouble swallowing.  You feel like you are choking when you swallow.  You cough up blood or vomit blood.  You have trouble breathing.  You choke, cannot swallow, or cannot breathe if you lie flat.  You notice swelling or a rash on your body, face, or tongue. Summary  Hoarseness, also called dysphonia, is any abnormal change in your voice that can make it difficult to speak. Your voice may sound raspy, breathy, or strained.  Hoarseness is caused by a problem with your vocal cords (vocal folds).  Do not speak in a loud or harsh voice, use nicotine or tobacco products, or eat foods that give you heartburn.  If recommended by your health care provider, meet with a speech-language specialist. This information is not intended to  replace advice given to you by your health care provider. Make sure you discuss any questions you have with your health care provider. Document Released: 07/13/2005 Document Revised: 07/12/2017 Document Reviewed: 04/26/2017 Elsevier Patient Education  2020 [place] for Gastroesophageal Reflux Disease, Adult When you have gastroesophageal reflux disease (GERD), the foods you eat and your eating habits are very important. Choosing the right  foods can help ease your discomfort. Think about working with a nutrition specialist (dietitian) to help you make good choices. What are tips for following this plan?  Meals  Choose healthy foods that are low in fat, such as fruits, vegetables, whole grains, low-fat dairy products, and lean meat, fish, and poultry.  Eat small meals often instead of 3 large meals a day. Eat your meals slowly, and in a place where you are relaxed. Avoid bending over or lying down until 2-3 hours after eating.  Avoid eating meals 2-3 hours before bed.  Avoid drinking a lot of liquid with meals.  Cook foods using methods other than frying. Bake, grill, or broil food instead.  Avoid or limit: ? Chocolate. ? Peppermint or spearmint. ? Alcohol. ? Pepper. ? Black and decaffeinated coffee. ? Black and decaffeinated tea. ? Bubbly (carbonated) soft drinks. ? Caffeinated energy drinks and soft drinks.  Limit high-fat foods such as: ? Fatty meat or fried foods. ? Whole milk, cream, butter, or ice cream. ? Nuts and nut butters. ? Pastries, donuts, and sweets made with butter or shortening.  Avoid foods that cause symptoms. These foods may be different for everyone. Common foods that cause symptoms include: ? Tomatoes. ? Oranges, lemons, and limes. ? Peppers. ? Spicy food. ? Onions and garlic. ? Vinegar. Lifestyle  Maintain a healthy weight. Ask your doctor what weight is healthy for you. If you need to lose weight, work with your doctor to do so safely.  Exercise for at least 30 minutes for 5 or more days each week, or as told by your doctor.  Wear loose-fitting clothes.  Do not smoke. If you need help quitting, ask your doctor.  Sleep with the head of your bed higher than your feet. Use a wedge under the mattress or blocks under the bed frame to raise the head of the bed. Summary  When you have gastroesophageal reflux disease (GERD), food and lifestyle choices are very important in easing your  symptoms.  Eat small meals often instead of 3 large meals a day. Eat your meals slowly, and in a place where you are relaxed.  Limit high-fat foods such as fatty meat or fried foods.  Avoid bending over or lying down until 2-3 hours after eating.  Avoid peppermint and spearmint, caffeine, alcohol, and chocolate. This information is not intended to replace advice given to you by your health care provider. Make sure you discuss any questions you have with your health care provider. Document Released: 01/29/2012 Document Revised: 11/20/2018 Document Reviewed: 09/04/2016 Elsevier Patient Education  [employer].    If you have lab work done today you will be contacted with your lab results within the next 2 weeks.  If you have not heard from [place] then please contact us. The fastest way to get your results is to register for My Chart.   IF you received an x-ray today, you will receive an invoice from [facility]. Please contact [facility] at [phone] with questions or concerns regarding your invoice.   IF you received labwork today, you  will receive an invoice from [facility]. Please contact [facility] at [phone] with questions or concerns regarding your invoice.   Our billing staff will not be able to assist you with questions regarding bills from these companies.  You will be contacted with the lab results as soon as they are available. The fastest way to get your results is to activate your My Chart account. Instructions are located on the last page of this paperwork. If you have not heard from [place] regarding the results in 2 weeks, please contact this office.

## 2019-03-12 NOTE — Progress Notes (Signed)
Subjective:    Patient ID: Jeremy Hendricks, male    DOB: 1956-01-31, 63 y.o.   MRN: [id number]  HPI Jeremy Hendricks is a 63 y.o. male Presents today for: Chief Complaint  Patient presents with  . Laryngitis    Change in voice for the past 3 month. Have to clear voice alot.   and follow up of meds/testing.   Voice Change: Past 4 months.  Frequent throat clearing.  Initially thought was spring allergies. Not much congestion/rhinorrhea. Occasional sneeze. No allergy meds.  No regular heartburn. Rare symptoms.  No wt loss/night sweats/fever.  No hemoptysis.  Non smoker - quit in early 40's, and again in 35's.   Hypogonadism: Last discussed in September 2019.  At that time he was using testosterone 30 mcg per actuation solution 1 pump per day.  Felt like that dose was working at that time.  Last PSA was normal at 1.8 in September 2019.  Normal hepatic function panel in February.  (Hyperglycemia noted at that time with 122, and recommended follow-up with PCP). Triglycerides also elevated slightly at 162 on his lipid panel in February, HDL low at 31, LDL normal at 43.  Recommended to reduce processed and simple sugars with repeat FLP in 6 months. Has adjusted diet.  Wt Readings from Last 3 Encounters:  03/12/19 183 lb 3.2 oz (83.1 kg)  10/09/18 181 lb 12.8 oz (82.5 kg)  09/23/18 181 lb (82.1 kg)   Sometimes spaces out dosing of testosterone when feeling more on edge/irritable - stops for a day or so. Few times per month.   Last level 358 in 04/2018.  PSA 1.8 in 04/2018.   Recent Labs       Lab Results  Component Value Date   WBC 7.8 10/10/2017   HGB 15.0 10/10/2017   HCT 44.6 10/10/2017   MCV 91 10/10/2017   PLT 262 10/10/2017     Preexposure prophylaxis: Treated with Truvada.  No new side effects.  Last STI testing in February. Recent Labs       Lab Results  Component Value Date   CREATININE 1.25 10/07/2018    3 unprotected partners since last testing.    Hyperglycemia: As above, glucose 122 on blood work in February.  No recent hemoglobin A1c.   .  Patient Active Problem List   Diagnosis Date Noted  . Skin fissures 10/09/2018  . Skin infection 10/09/2018  . Chronic diastolic CHF (congestive heart failure) ([place]) 09/23/2018  . CKD (chronic kidney disease) stage 2, GFR 60-89 ml/min 09/23/2018  . History of ETT   . Gout   . DJD (degenerative joint disease)   . Coronary artery disease   . Atrial fibrillation ([place])   . 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 HLD (hyperlipidemia) 06/11/2016  . Hypogonadism in male 09/09/2015  . INSOMNIA 09/19/2010  . SHORTNESS OF BREATH 09/19/2010  . CHEST PAIN-PRECORDIAL 08/23/2010  . CAD, ARTERY BYPASS GRAFT 10/26/2009  . HYPERLIPIDEMIA 10/24/2009  . DEGENERATIVE JOINT DISEASE 10/24/2009  . ANGINA, HX OF 10/24/2009   Past Medical History:  Diagnosis Date  . Atrial fibrillation ([place])    postoperative  . Chronic diastolic CHF (congestive heart failure) ([place])   . CKD (chronic kidney disease), stage II   . Coronary artery disease    a. s/p CABG 2002. b. s/p redo 2012.  Marland Kitchen DJD (degenerative joint disease)   . Gout   . History of ETT    a. ETT 6/16:  normal   Past Surgical History:  Procedure Laterality Date  . CORONARY ARTERY BYPASS GRAFT  2002  . CORONARY ARTERY BYPASS GRAFT  08-2010   L-LAD remained from original CABG; new grafts incl L radial- PDA + RIMA-RI  . VASECTOMY     No Known Allergies Prior to Admission medications   Medication Sig Start Date End Date Taking? Authorizing Provider  aspirin EC 81 MG tablet Take 81 mg daily by mouth.   Yes [provider]  atenolol (TENORMIN) 25 MG tablet TAKE 1 TABLET BY MOUTH EVERY DAY 02/27/19  Yes Dunn, Dayna N, PA-C  furosemide (LASIX) 40 MG tablet TAKE 1 TABLET BY MOUTH DAILY AND 1/2 TABLET BY MOUTH DAILY AS NEEDED FOR SWELLING 09/10/18  Yes Dunn, Dayna N, PA-C  Multiple Vitamin (MULTIVITAMIN) tablet Take 1 tablet by mouth daily.     Yes [provider]  nitroGLYCERIN (NITROSTAT) 0.4 MG SL tablet Place 0.4 mg under the tongue every 5 (five) minutes as needed for chest pain. Reported on 09/21/2015   Yes [provider]  rosuvastatin (CRESTOR) 40 MG tablet TAKE 1 TABLET BY MOUTH EVERY DAY 09/08/18  Yes Dunn, Nedra Hai, PA-C  Testosterone 30 MG/ACT SOLN Apply 30 mg topically daily. 03/09/19  Yes Wendie Agreste, MD  TRUVADA 200-300 MG tablet TAKE ONE TABLET BY MOUTH ONCE DAILY WITH OR WITHOUT FOOD. STORE IN ORIGINAL CONTAINER AT ROOM TEMPERATURE. 03/04/19  Yes Wendie Agreste, MD   Social History   Socioeconomic History  . Marital status: Single    Spouse name: Not on file  . Number of children: 2  . Years of education: Not on file  . Highest education level: Not on file  Occupational History  . Occupation: Cabin crew  Social Needs  . Financial resource strain: Not on file  . Food insecurity    Worry: Not on file    Inability: Not on file  . Transportation needs    Medical: Not on file    Non-medical: Not on file  Tobacco Use  . Smoking status: Former Smoker    Quit date: 08/14/1999    Years since quitting: 19.5  . Smokeless tobacco: Never Used  Substance and Sexual Activity  . Alcohol use: Yes    Alcohol/week: 14.0 standard drinks    Types: 14 Standard drinks or equivalent per week  . Drug use: No  . Sexual activity: Yes  Lifestyle  . Physical activity    Days per week: Not on file    Minutes per session: Not on file  . Stress: Not on file  Relationships  . Social Herbalist on phone: Not on file    Gets together: Not on file    Attends religious service: Not on file    Active member of club or organization: Not on file    Attends meetings of clubs or organizations: Not on file    Relationship status: Not on file  . Intimate partner violence    Fear of current or ex partner: Not on file    Emotionally abused: Not on file    Physically abused: Not on file    Forced sexual activity: Not on  file  Other Topics Concern  . Not on file  Social History Narrative   Single   Education: College   Exercise: Yes    Review of Systems Per HPI.     Objective:   Physical Exam Vitals signs reviewed.  Constitutional:      Appearance: He is well-developed.  HENT:     Head: Normocephalic and atraumatic.  Eyes:     Pupils: Pupils are equal, round, and reactive to light.  Neck:     Vascular: No carotid bruit or JVD.  Cardiovascular:     Rate and Rhythm: Normal rate and regular rhythm.     Heart sounds: Normal heart sounds. No murmur.  Pulmonary:     Effort: Pulmonary effort is normal.     Breath sounds: Normal breath sounds. No rales.  Skin:    General: Skin is warm and dry.  Neurological:     Mental Status: He is alert and oriented to person, place, and time.    Vitals:   03/12/19 0852  BP: 113/70  Pulse: (!) 56  Resp: 14  Temp: 98.3 F (36.8 C)  TempSrc: Oral  SpO2: 96%  Weight: 183 lb 3.2 oz (83.1 kg)      Assessment & Plan:    Jeremy Hendricks is a 63 y.o. male Hypogonadism in male - Plan: Testosterone, Free, Total, SHBG, Comprehensive metabolic panel, Lipid panel, PSA, CBC Medication monitoring encounter - Plan: Testosterone, Free, Total, SHBG, Comprehensive metabolic panel, Lipid panel, PSA, CBC Low testosterone  -Check testosterone, monitoring labs as above.  Continue same dose.  Hypertriglyceridemia - Plan: Comprehensive metabolic panel, Lipid panel  -Tolerating Crestor, continue same.  Hyperglycemia - Plan: Hemoglobin A1c  -Screen for diabetes/prediabetes.  Diet/exercise as initial treatment.  On pre-exposure prophylaxis for HIV - Plan: Comprehensive metabolic panel Routine screening for STI (sexually transmitted infection) - Plan: HIV Antibody (routine testing w rflx), RPR, GC/Chlamydia probe amp ([place])not at [facility], emtricitabine-tenofovir (TRUVADA) 200-300 MG tablet  -Safer sex practices were discussed.  Tolerating Truvada, check renal function  panel, STI testing.  Voice hoarseness - Plan: fluticasone (FLONASE) 50 MCG/ACT nasal spray  -Differential includes allergic rhinitis with postnasal drip versus mild reflux/laryngeal pharyngeal reflux.  Initial trial of Flonase, over-the-counter Pepcid, avoidance of GERD triggers, but follow-up with ENT if not improving in the next few weeks.  Meds ordered this encounter  Medications  . emtricitabine-tenofovir (TRUVADA) 200-300 MG tablet    Sig: TAKE ONE TABLET BY MOUTH ONCE DAILY WITH OR WITHOUT FOOD. STORE IN ORIGINAL CONTAINER AT ROOM TEMPERATURE.    Dispense:  90 tablet    Refill:  2  . fluticasone (FLONASE) 50 MCG/ACT nasal spray    Sig: Place 1 spray into both nostrils daily.    Dispense:  16 g    Refill:  6   Patient Instructions   Try flonase nasal spray, and pepcid over the counter for the next 2 weeks. If not helping  - I can refer you to ENT specialist. If improving, can try stopping one of the two meds to determine ultimate cause. Info on heartburn below.   No change in other meds. Recheck in 6 months. Sooner if worse symptoms.    Hoarseness  Hoarseness, also called dysphonia, is any abnormal change in your voice that can make it difficult to speak. Your voice may sound raspy, breathy, or strained. Hoarseness is caused by a problem with your vocal cords (vocal folds). These are two bands of tissue inside your voice box (larynx). When you speak, your vocal cords move back and forth to create sound. The surfaces of your vocal cords need to be smooth for your voice to sound clear. Swelling or lumps on your vocal cords can cause hoarseness. Common causes of vocal cord problems include:  Infection in the nose, throat, and upper air  passages (upper respiratory infection).  A long-term cough.  Straining or overusing your voice.  Smoking, or exposure to secondhand smoke.  Allergies.  Medication side effects.  Vocal cord growths.  Vocal cord injuries.  Stomach acids  that move up in your throat and irritate your vocal cords (gastroesophageal reflux).  Diseases that affect the nervous system, such as a stroke or Parkinson's disease. Follow these instructions at home: Watch your condition for any changes. To ease discomfort and protect your vocal cords:  Rest your voice.  Do not whisper. Whispering can cause muscle strain.  Do not speak in a loud or harsh voice.  Avoid coughing or clearing your throat.  Do not use any products that contain nicotine or tobacco, such as cigarettes and e-cigarettes. If you need help quitting, ask your health care provider.  Avoid secondhand smoke.  Do not eat foods that give you heartburn, such as spicy or acidic foods like hot peppers and orange juice. Heartburn can make gastroesophageal reflux worse.  Do not drink beverages that contain caffeine (coffee, tea, or soft drinks) or alcohol (beer, wine, or liquor).  Drink enough fluid to keep your urine pale yellow.  Use a humidifier if the air in your home is dry. If recommended by your health care provider, schedule an appointment with a speech-language specialist. This specialist may give you methods to try that can help you avoid misusing your voice. Contact a health care provider if:  You have hoarseness that lasts longer than 3 weeks.  You almost lose or completely lose your voice for more than 3 days.  You have pain when you swallow or try to talk.  You feel a lump in your neck. Get help right away if:  You have trouble swallowing.  You feel like you are choking when you swallow.  You cough up blood or vomit blood.  You have trouble breathing.  You choke, cannot swallow, or cannot breathe if you lie flat.  You notice swelling or a rash on your body, face, or tongue. Summary  Hoarseness, also called dysphonia, is any abnormal change in your voice that can make it difficult to speak. Your voice may sound raspy, breathy, or strained.  Hoarseness is  caused by a problem with your vocal cords (vocal folds).  Do not speak in a loud or harsh voice, use nicotine or tobacco products, or eat foods that give you heartburn.  If recommended by your health care provider, meet with a speech-language specialist. This information is not intended to replace advice given to you by your health care provider. Make sure you discuss any questions you have with your health care provider. Document Released: 07/13/2005 Document Revised: 07/12/2017 Document Reviewed: 04/26/2017 Elsevier Patient Education  2020 [place] for Gastroesophageal Reflux Disease, Adult When you have gastroesophageal reflux disease (GERD), the foods you eat and your eating habits are very important. Choosing the right foods can help ease your discomfort. Think about working with a nutrition specialist (dietitian) to help you make good choices. What are tips for following this plan?  Meals  Choose healthy foods that are low in fat, such as fruits, vegetables, whole grains, low-fat dairy products, and lean meat, fish, and poultry.  Eat small meals often instead of 3 large meals a day. Eat your meals slowly, and in a place where you are relaxed. Avoid bending over or lying down until 2-3 hours after eating.  Avoid eating meals 2-3 hours before bed.  Avoid  drinking a lot of liquid with meals.  Cook foods using methods other than frying. Bake, grill, or broil food instead.  Avoid or limit: ? Chocolate. ? Peppermint or spearmint. ? Alcohol. ? Pepper. ? Black and decaffeinated coffee. ? Black and decaffeinated tea. ? Bubbly (carbonated) soft drinks. ? Caffeinated energy drinks and soft drinks.  Limit high-fat foods such as: ? Fatty meat or fried foods. ? Whole milk, cream, butter, or ice cream. ? Nuts and nut butters. ? Pastries, donuts, and sweets made with butter or shortening.  Avoid foods that cause symptoms. These foods may be different for everyone.  Common foods that cause symptoms include: ? Tomatoes. ? Oranges, lemons, and limes. ? Peppers. ? Spicy food. ? Onions and garlic. ? Vinegar. Lifestyle  Maintain a healthy weight. Ask your doctor what weight is healthy for you. If you need to lose weight, work with your doctor to do so safely.  Exercise for at least 30 minutes for 5 or more days each week, or as told by your doctor.  Wear loose-fitting clothes.  Do not smoke. If you need help quitting, ask your doctor.  Sleep with the head of your bed higher than your feet. Use a wedge under the mattress or blocks under the bed frame to raise the head of the bed. Summary  When you have gastroesophageal reflux disease (GERD), food and lifestyle choices are very important in easing your symptoms.  Eat small meals often instead of 3 large meals a day. Eat your meals slowly, and in a place where you are relaxed.  Limit high-fat foods such as fatty meat or fried foods.  Avoid bending over or lying down until 2-3 hours after eating.  Avoid peppermint and spearmint, caffeine, alcohol, and chocolate. This information is not intended to replace advice given to you by your health care provider. Make sure you discuss any questions you have with your health care provider. Document Released: 01/29/2012 Document Revised: 11/20/2018 Document Reviewed: 09/04/2016 Elsevier Patient Education  [employer].    If you have lab work done today you will be contacted with your lab results within the next 2 weeks.  If you have not heard from [place] then please contact us. The fastest way to get your results is to register for My Chart.   IF you received an x-ray today, you will receive an invoice from [facility]. Please contact [facility] at [phone] with questions or concerns regarding your invoice.   IF you received labwork today, you will receive an invoice from [place]. Please contact [facility] at [phone] with  questions or concerns regarding your invoice.   Our billing staff will not be able to assist you with questions regarding bills from these companies.  You will be contacted with the lab results as soon as they are available. The fastest way to get your results is to activate your My Chart account. Instructions are located on the last page of this paperwork. If you have not heard from [place] regarding the results in 2 weeks, please contact this office.       Signed,   [name], MD [facility].  03/15/19 8:49 AM

## 2019-03-13 LAB — TESTOSTERONE, FREE, TOTAL, SHBG
Sex Hormone Binding: 26.3 nmol/L (ref 19.3–76.4)
Testosterone, Free: 16.2 pg/mL (ref 6.6–18.1)
Testosterone: 666 ng/dL (ref 264–916)

## 2019-03-13 LAB — COMPREHENSIVE METABOLIC PANEL
ALT: 22 IU/L (ref 0–44)
AST: 21 IU/L (ref 0–40)
Albumin/Globulin Ratio: 2.3 — ABNORMAL HIGH (ref 1.2–2.2)
Albumin: 5 g/dL — ABNORMAL HIGH (ref 3.8–4.8)
Alkaline Phosphatase: 64 IU/L (ref 39–117)
BUN/Creatinine Ratio: 16 (ref 10–24)
BUN: 19 mg/dL (ref 8–27)
Bilirubin Total: 0.5 mg/dL (ref 0.0–1.2)
CO2: 24 mmol/L (ref 20–29)
Calcium: 9.8 mg/dL (ref 8.6–10.2)
Chloride: 97 mmol/L (ref 96–106)
Creatinine, Ser: 1.19 mg/dL (ref 0.76–1.27)
GFR calc Af Amer: 75 mL/min/{1.73_m2} (ref >59)
GFR calc non Af Amer: 65 mL/min/{1.73_m2} (ref >59)
Globulin, Total: 2.2 g/dL (ref 1.5–4.5)
Glucose: 96 mg/dL (ref 65–99)
Potassium: 4.8 mmol/L (ref 3.5–5.2)
Sodium: 140 mmol/L (ref 134–144)
Total Protein: 7.2 g/dL (ref 6.0–8.5)

## 2019-03-13 LAB — CBC
Hematocrit: 47.3 % (ref 37.5–51.0)
Hemoglobin: 16.3 g/dL (ref 13.0–17.7)
MCH: 31.4 pg (ref 26.6–33.0)
MCHC: 34.5 g/dL (ref 31.5–35.7)
MCV: 91 fL (ref 79–97)
Platelets: 218 10*3/uL (ref 150–450)
RBC: 5.19 x10E6/uL (ref 4.14–5.80)
RDW: 12.8 % (ref 11.6–15.4)
WBC: 8.3 10*3/uL (ref 3.4–10.8)

## 2019-03-13 LAB — LIPID PANEL
Chol/HDL Ratio: 3.9 ratio (ref 0.0–5.0)
Cholesterol, Total: 125 mg/dL (ref 100–199)
HDL: 32 mg/dL — ABNORMAL LOW (ref >39)
LDL Calculated: 62 mg/dL (ref 0–99)
Triglycerides: 155 mg/dL — ABNORMAL HIGH (ref 0–149)
VLDL Cholesterol Cal: 31 mg/dL (ref 5–40)

## 2019-03-13 LAB — HIV ANTIBODY (ROUTINE TESTING W REFLEX): HIV Screen 4th Generation wRfx: NONREACTIVE

## 2019-03-13 LAB — HEMOGLOBIN A1C
Est. average glucose Bld gHb Est-mCnc: 114 mg/dL
Hgb A1c MFr Bld: 5.6 % (ref 4.8–5.6)

## 2019-03-13 LAB — PSA: Prostate Specific Ag, Serum: 1.4 ng/mL (ref 0.0–4.0)

## 2019-03-13 LAB — GC/CHLAMYDIA PROBE AMP (~~LOC~~) NOT AT ARMC
Chlamydia: NEGATIVE
Neisseria Gonorrhea: NEGATIVE

## 2019-03-13 LAB — RPR: RPR Ser Ql: NONREACTIVE

## 2019-03-15 ENCOUNTER — Other Ambulatory Visit: Payer: Self-pay | Admitting: Physician Assistant

## 2019-04-08 ENCOUNTER — Other Ambulatory Visit: Payer: Self-pay

## 2019-04-08 ENCOUNTER — Other Ambulatory Visit: Payer: BC Managed Care – PPO | Admitting: *Deleted

## 2019-04-08 DIAGNOSIS — E785 Hyperlipidemia, unspecified: Secondary | ICD-10-CM

## 2019-04-08 LAB — LIPID PANEL
Chol/HDL Ratio: 3 ratio (ref 0.0–5.0)
Cholesterol, Total: 92 mg/dL — ABNORMAL LOW (ref 100–199)
HDL: 31 mg/dL — ABNORMAL LOW (ref >39)
LDL Calculated: 46 mg/dL (ref 0–99)
Triglycerides: 76 mg/dL (ref 0–149)
VLDL Cholesterol Cal: 15 mg/dL (ref 5–40)

## 2019-04-28 ENCOUNTER — Other Ambulatory Visit: Payer: Self-pay | Admitting: Family Medicine

## 2019-04-28 DIAGNOSIS — M109 Gout, unspecified: Secondary | ICD-10-CM

## 2019-04-28 NOTE — Telephone Encounter (Signed)
Requested medication (s) are due for refill today: no  Requested medication (s) are on the active medication list: no  Last refill:  09/21/2018  Future visit scheduled: yes  Notes to clinic: refill cannot be delegated    Requested Prescriptions  Pending Prescriptions Disp Refills   predniSONE (DELTASONE) 20 MG tablet [Pharmacy Med Name: PREDNISONE 20 MG TABLET] 10 tablet 0    Sig: Take 2 tablets (40 mg total) by mouth daily with breakfast.     Not Delegated - Endocrinology:  Oral Corticosteroids Failed - 04/28/2019  8:05 AM      Failed - This refill cannot be delegated      Passed - Last BP in normal range    BP Readings from Last 1 Encounters:  03/12/19 113/70         Passed - Valid encounter within last 6 months    Recent Outpatient Visits          1 month ago Hypogonadism in male   [facility], Ranell Patrick, MD   1 month ago Low testosterone   [facility], Ranell Patrick, MD   6 months ago Skin fissures   [facility] at [facility], [place], MD   6 months ago Routine screening for STI (sexually transmitted infection)   [facility], Lilia Argue, MD   9 months ago Skin lesion of left arm   [facility], Laurey Arrow, MD      Future Appointments            In 4 months Carlota Raspberry Ranell Patrick, MD [facility] at [place], [facility]

## 2019-04-30 ENCOUNTER — Telehealth (INDEPENDENT_AMBULATORY_CARE_PROVIDER_SITE_OTHER): Payer: BC Managed Care – PPO | Admitting: Family Medicine

## 2019-04-30 ENCOUNTER — Other Ambulatory Visit: Payer: Self-pay

## 2019-04-30 DIAGNOSIS — M109 Gout, unspecified: Secondary | ICD-10-CM

## 2019-04-30 DIAGNOSIS — Z79899 Other long term (current) drug therapy: Secondary | ICD-10-CM

## 2019-04-30 MED ORDER — PREDNISONE 20 MG PO TABS: ORAL_TABLET | ORAL | 0 refills | Status: AC

## 2019-04-30 NOTE — Progress Notes (Signed)
Virtual Visit via Telephone Note  I connected with Jeremy Hendricks on 04/30/19 at 1:28 PM by telephone and verified that I am speaking with the correct person using two identifiers.   I discussed the limitations, risks, security and privacy concerns of performing an evaluation and management service by telephone and the availability of in person appointments. I also discussed with the patient that there may be a patient responsible charge related to this service. The patient expressed understanding and agreed to proceed, consent obtained  Chief complaint:  Gout.   History of Present Illness: Jeremy Hendricks is a 63 y.o. male  Gout: No current flare. Thinks few days ago had small flare in knee.  Has been able to take prednisone single 20mg  pill resolves. Took second one on few occasion in past.  During flare - swelling, redness, and pain within joint.  Prior flare 1 month ago - ankle.   migrates Prednisone 20mg  #10 in February. 1 pill left. Still occasional alcoholic drink - has had flares with alcohol in past. approx drinks per week.  Daily meds: none. Has not tried allopurinol.  Prn med: prednisone only.  Lab Results  Component Value Date   LABURIC 8.6 12/30/2017  Uric acid did decrease from 9.5 prior.  Did recommend allopurinol once per day when labs obtained in June 2019.     Patient Active Problem List   Diagnosis Date Noted  . Skin fissures 10/09/2018  . Skin infection 10/09/2018  . Chronic diastolic CHF (congestive heart failure) ([place]) 09/23/2018  . CKD (chronic kidney disease) stage 2, GFR 60-89 ml/min 09/23/2018  . History of ETT   . Gout   . DJD (degenerative joint disease)   . Coronary artery disease   . Atrial fibrillation ([place])   . 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 HLD (hyperlipidemia) 06/11/2016  . Hypogonadism in male 09/09/2015  . INSOMNIA 09/19/2010  . SHORTNESS OF BREATH 09/19/2010  . CHEST PAIN-PRECORDIAL 08/23/2010  . CAD, ARTERY BYPASS GRAFT 10/26/2009  .  HYPERLIPIDEMIA 10/24/2009  . DEGENERATIVE JOINT DISEASE 10/24/2009  . ANGINA, HX OF 10/24/2009   Past Medical History:  Diagnosis Date  . Atrial fibrillation ([place])    postoperative  . Chronic diastolic CHF (congestive heart failure) ([place])   . CKD (chronic kidney disease), stage II   . Coronary artery disease    a. s/p CABG 2002. b. s/p redo 2012.  Marland Kitchen DJD (degenerative joint disease)   . Gout   . History of ETT    a. ETT 6/16:  normal   Past Surgical History:  Procedure Laterality Date  . CORONARY ARTERY BYPASS GRAFT  2002  . CORONARY ARTERY BYPASS GRAFT  08-2010   L-LAD remained from original CABG; new grafts incl L radial- PDA + RIMA-RI  . VASECTOMY     No Known Allergies Prior to Admission medications   Medication Sig Start Date End Date Taking? Authorizing Provider  aspirin EC 81 MG tablet Take 81 mg daily by mouth.    [provider]  atenolol (TENORMIN) 25 MG tablet TAKE 1 TABLET BY MOUTH EVERY DAY 02/27/19   Dunn, Dayna N, PA-C  emtricitabine-tenofovir (TRUVADA) 200-300 MG tablet TAKE ONE TABLET BY MOUTH ONCE DAILY WITH OR WITHOUT FOOD. STORE IN ORIGINAL CONTAINER AT ROOM TEMPERATURE. 03/12/19   Wendie Agreste, MD  fluticasone [facility]) 50 MCG/ACT nasal spray Place 1 spray into both nostrils daily. 03/12/19   Wendie Agreste, MD  furosemide (LASIX) 40 MG tablet TAKE 1 TABLET BY  MOUTH DAILY. ALSO TAKE 1/2 TABLET BY MOUTH DAILY AS NEEDED FOR SWELLING. 03/16/19   Dunn, Lisbeth Renshaw N, PA-C  Multiple Vitamin (MULTIVITAMIN) tablet Take 1 tablet by mouth daily.      [provider]  nitroGLYCERIN (NITROSTAT) 0.4 MG SL tablet Place 0.4 mg under the tongue every 5 (five) minutes as needed for chest pain. Reported on 09/21/2015    [provider]  rosuvastatin (CRESTOR) 40 MG tablet TAKE 1 TABLET BY MOUTH EVERY DAY 09/08/18   Charlie Pitter, PA-C  Testosterone 30 MG/ACT SOLN Apply 30 mg topically daily. 03/09/19   Wendie Agreste, MD   Social History   Socioeconomic  History  . Marital status: Single    Spouse name: Not on file  . Number of children: 2  . Years of education: Not on file  . Highest education level: Not on file  Occupational History  . Occupation: Cabin crew  Social Needs  . Financial resource strain: Not on file  . Food insecurity    Worry: Not on file    Inability: Not on file  . Transportation needs    Medical: Not on file    Non-medical: Not on file  Tobacco Use  . Smoking status: Former Smoker    Quit date: 08/14/1999    Years since quitting: 19.7  . Smokeless tobacco: Never Used  Substance and Sexual Activity  . Alcohol use: Yes    Alcohol/week: 14.0 standard drinks    Types: 14 Standard drinks or equivalent per week  . Drug use: No  . Sexual activity: Yes  Lifestyle  . Physical activity    Days per week: Not on file    Minutes per session: Not on file  . Stress: Not on file  Relationships  . Social Herbalist on phone: Not on file    Gets together: Not on file    Attends religious service: Not on file    Active member of club or organization: Not on file    Attends meetings of clubs or organizations: Not on file    Relationship status: Not on file  . Intimate partner violence    Fear of current or ex partner: Not on file    Emotionally abused: Not on file    Physically abused: Not on file    Forced sexual activity: Not on file  Other Topics Concern  . Not on file  Social History Narrative   Single   Education: College   Exercise: Yes     Observations/Objective: No distress.  Appropriate sponsors, all questions answered.  There were no vitals filed for this visit.   Assessment and Plan: Medication management - Plan: predniSONE (DELTASONE) 20 MG tablet  Acute gout, unspecified cause, unspecified site - Plan: predniSONE (DELTASONE) 20 MG tablet   -Frequent gout.  Does report fairly recurrent/frequent symptoms although minor and able to improve quickly with just 20 mg or the next 40 mg  prednisone.  Risks of this medication have been discussed before.  Ultimately though, with frequent flares discussed risk of destructive arthropathy with frequent episodes.  -Prednisone temporarily refilled for 1 to 3 days total use if needed.  -Check uric acid, then would consider allopurinol once per day.  Briefly discussed today.  Option of meeting with rheumatology if he would like to get their opinion on treatment plan as well.  Follow Up Instructions:  lab only visit, then prn.    I discussed the assessment and treatment plan with  the patient. The patient was provided an opportunity to ask questions and all were answered. The patient agreed with the plan and demonstrated an understanding of the instructions.   The patient was advised to call back or seek an in-person evaluation if the symptoms worsen or if the condition fails to improve as anticipated.  I provided 19 minutes of non-face-to-face time during this encounter.  Signed,   [name], MD [facility] at [place].  04/30/19

## 2019-04-30 NOTE — Progress Notes (Signed)
CC- gout flare up- onset of gout. Need rx for prednisone- Not having any issus at this time but will be going out of town and may have a drink so would like to have rx on hand.

## 2019-06-03 ENCOUNTER — Other Ambulatory Visit: Payer: Self-pay | Admitting: Physician Assistant

## 2019-06-03 ENCOUNTER — Other Ambulatory Visit: Payer: Self-pay

## 2019-06-03 ENCOUNTER — Ambulatory Visit (INDEPENDENT_AMBULATORY_CARE_PROVIDER_SITE_OTHER): Payer: BC Managed Care – PPO | Admitting: Family Medicine

## 2019-06-03 DIAGNOSIS — M109 Gout, unspecified: Secondary | ICD-10-CM

## 2019-06-04 LAB — URIC ACID: Uric Acid: 9.3 mg/dL — ABNORMAL HIGH (ref 3.7–8.6)

## 2019-06-08 ENCOUNTER — Encounter: Payer: Self-pay | Admitting: Family Medicine

## 2019-06-08 DIAGNOSIS — M109 Gout, unspecified: Secondary | ICD-10-CM

## 2019-06-08 MED ORDER — ALLOPURINOL 100 MG PO TABS: 100.0000 mg | ORAL_TABLET | Freq: Every day | ORAL | 1 refills | Status: DC

## 2019-07-27 ENCOUNTER — Telehealth: Payer: Self-pay | Admitting: Family Medicine

## 2019-07-27 NOTE — Telephone Encounter (Signed)
Pt had a telemed in September and had a nurse visit in October for uric acid. Pt is requesting another lab order to be put in for uric acid and refused office appointment.  Pt has gout flare up and states his prescription did not clear the symptoms.   Pt requesting cb when lab order is in so he can come into the office for labs only. Please advise

## 2019-07-29 ENCOUNTER — Other Ambulatory Visit: Payer: Self-pay

## 2019-07-29 ENCOUNTER — Ambulatory Visit: Payer: BC Managed Care – PPO

## 2019-07-29 DIAGNOSIS — M109 Gout, unspecified: Secondary | ICD-10-CM

## 2019-07-29 NOTE — Telephone Encounter (Signed)
Called pt and scheduled him for a lab only visit for 2pm today. Pt stated understanding.

## 2019-07-30 LAB — BASIC METABOLIC PANEL
BUN/Creatinine Ratio: 16 (ref 10–24)
BUN: 24 mg/dL (ref 8–27)
CO2: 28 mmol/L (ref 20–29)
Calcium: 9.2 mg/dL (ref 8.6–10.2)
Chloride: 101 mmol/L (ref 96–106)
Creatinine, Ser: 1.48 mg/dL — ABNORMAL HIGH (ref 0.76–1.27)
GFR calc Af Amer: 58 mL/min/{1.73_m2} — ABNORMAL LOW (ref >59)
GFR calc non Af Amer: 50 mL/min/{1.73_m2} — ABNORMAL LOW (ref >59)
Glucose: 153 mg/dL — ABNORMAL HIGH (ref 65–99)
Potassium: 4.3 mmol/L (ref 3.5–5.2)
Sodium: 143 mmol/L (ref 134–144)

## 2019-07-30 LAB — URIC ACID: Uric Acid: 7.5 mg/dL (ref 3.8–8.4)

## 2019-08-02 ENCOUNTER — Encounter: Payer: Self-pay | Admitting: Family Medicine

## 2019-08-17 ENCOUNTER — Telehealth: Payer: Self-pay | Admitting: Family Medicine

## 2019-08-17 NOTE — Telephone Encounter (Signed)
Pt called requesting pain medication for his gout. He has an appt tomorrow but has called twice today in excruciating pain. He wants to know if the office will send him something today to help manage the pain and allow him to get some sleep today.  Pharmacy on [address] has been verified

## 2019-08-18 ENCOUNTER — Ambulatory Visit: Payer: BC Managed Care – PPO | Admitting: Registered Nurse

## 2019-08-18 ENCOUNTER — Encounter: Payer: Self-pay | Admitting: Registered Nurse

## 2019-08-18 ENCOUNTER — Other Ambulatory Visit: Payer: Self-pay

## 2019-08-18 VITALS — BP 132/71 | HR 78 | Temp 98.4°F | Resp 16 | Ht 67.0 in | Wt 190.4 lb

## 2019-08-18 DIAGNOSIS — M109 Gout, unspecified: Secondary | ICD-10-CM | POA: Diagnosis not present

## 2019-08-18 MED ORDER — COLCHICINE 0.6 MG PO CAPS: ORAL_CAPSULE | ORAL | 2 refills | Status: DC

## 2019-08-18 MED ORDER — TRAMADOL HCL 50 MG PO TABS: 50.0000 mg | ORAL_TABLET | Freq: Three times a day (TID) | ORAL | 0 refills | Status: AC | PRN

## 2019-08-18 NOTE — Progress Notes (Signed)
Acute Office Visit  Subjective:    Patient ID: Jeremy Hendricks, male    DOB: [birth year], 64 y.o.   MRN: [id number]  Chief Complaint  Patient presents with  . Gout    patient stated that in his left foot and right heel for that past ten days gout got inflammed . patient states he usually takes prednisone for emergengcy. Coworker had colchicine and inomethhacin that patient took and said it works great wants to know if he could have one if not both prescribed.    HPI Patient is in today for gout flare.  Notes that he has been taking his allopurinol as prescribed. His most recent uric acid levels are on the upper end of normal, lower than previous studies. Notes that Dr. Carlota Raspberry had put him on prednisone bursts in the past, but this time, having breakthrough pain. Affecting L foot and L ankle. Reports that he tried a leftover hydrocodone that he had at home with no relief. Tramadol at home gave some relief. Then, a friend gave him a colchicine and indomethicin, which brought his pain from 8/10 down to 2/10. Interested in getting these medications or similar today.   Past Medical History:  Diagnosis Date  . Atrial fibrillation ([place])    postoperative  . Chronic diastolic CHF (congestive heart failure) ([place])   . CKD (chronic kidney disease), stage II   . Coronary artery disease    a. s/p CABG 2002. b. s/p redo 2012.  Marland Kitchen DJD (degenerative joint disease)   . Gout   . History of ETT    a. ETT 6/16:  normal    Past Surgical History:  Procedure Laterality Date  . CORONARY ARTERY BYPASS GRAFT  2002  . CORONARY ARTERY BYPASS GRAFT  08-2010   L-LAD remained from original CABG; new grafts incl L radial- PDA + RIMA-RI  . VASECTOMY      Family History  Problem Relation Age of Onset  . Heart attack Father 47  . Hypertension Father   . Cancer Mother        Breast cancer  . Cancer Unknown        family hx of  . Coronary artery disease Unknown        family hx of  . Hyperlipidemia Unknown          family hx of  . Hypertension Paternal Grandfather   . Stroke Neg Hx   . Colon cancer Neg Hx     Social History   Socioeconomic History  . Marital status: Single    Spouse name: Not on file  . Number of children: 2  . Years of education: Not on file  . Highest education level: Not on file  Occupational History  . Occupation: Realtor  Tobacco Use  . Smoking status: Former Smoker    Quit date: 08/14/1999    Years since quitting: 20.0  . Smokeless tobacco: Never Used  Substance and Sexual Activity  . Alcohol use: Yes    Alcohol/week: 14.0 standard drinks    Types: 14 Standard drinks or equivalent per week  . Drug use: No  . Sexual activity: Yes  Other Topics Concern  . Not on file  Social History Narrative   Single   Education: [employer]   Exercise: Yes   Social Determinants of Health   Financial Resource Strain:   . Difficulty of Paying Living Expenses: Not on file  Food Insecurity:   . Worried About Charity fundraiser in  the Last Year: Not on file  . Ran Out of Food in the Last Year: Not on file  Transportation Needs:   . Lack of Transportation (Medical): Not on file  . Lack of Transportation (Non-Medical): Not on file  Physical Activity:   . Days of Exercise per Week: Not on file  . Minutes of Exercise per Session: Not on file  Stress:   . Feeling of Stress : Not on file  Social Connections:   . Frequency of Communication with Friends and Family: Not on file  . Frequency of Social Gatherings with Friends and Family: Not on file  . Attends Religious Services: Not on file  . Active Member of Clubs or Organizations: Not on file  . Attends Archivist Meetings: Not on file  . Marital Status: Not on file  Intimate Partner Violence:   . Fear of Current or Ex-Partner: Not on file  . Emotionally Abused: Not on file  . Physically Abused: Not on file  . Sexually Abused: Not on file    Outpatient Medications Prior to Visit  Medication Sig Dispense  Refill  . allopurinol (ZYLOPRIM) 100 MG tablet Take 1 tablet (100 mg total) by mouth daily. 90 tablet 1  . aspirin EC 81 MG tablet Take 81 mg daily by mouth.    Marland Kitchen atenolol (TENORMIN) 25 MG tablet TAKE 1 TABLET BY MOUTH EVERY DAY 90 tablet 3  . emtricitabine-tenofovir (TRUVADA) 200-300 MG tablet TAKE ONE TABLET BY MOUTH ONCE DAILY WITH OR WITHOUT FOOD. STORE IN ORIGINAL CONTAINER AT ROOM TEMPERATURE. 90 tablet 2  . fluticasone (FLONASE) 50 MCG/ACT nasal spray Place 1 spray into both nostrils daily. 16 g 6  . furosemide (LASIX) 40 MG tablet TAKE 1 TABLET BY MOUTH DAILY. ALSO TAKE 1/2 TABLET BY MOUTH DAILY AS NEEDED FOR SWELLING. 135 tablet 1  . Multiple Vitamin (MULTIVITAMIN) tablet Take 1 tablet by mouth daily.      . nitroGLYCERIN (NITROSTAT) 0.4 MG SL tablet Place 0.4 mg under the tongue every 5 (five) minutes as needed for chest pain. Reported on 09/21/2015    . rosuvastatin (CRESTOR) 40 MG tablet TAKE 1 TABLET BY MOUTH EVERY DAY 90 tablet 0  . Testosterone 30 MG/ACT SOLN Apply 30 mg topically daily. 90 mL 2   No facility-administered medications prior to visit.    No Known Allergies  Review of Systems  Constitutional: Negative.   HENT: Negative.   Eyes: Negative.   Respiratory: Negative.   Cardiovascular: Negative.   Gastrointestinal: Negative.   Endocrine: Negative.   Genitourinary: Negative.   Musculoskeletal: Positive for arthralgias (L foot and ankle), gait problem (d/t pain in L foot and ankle) and joint swelling (L foot and ankle). Negative for back pain, myalgias, neck pain and neck stiffness.  Skin: Negative.   Allergic/Immunologic: Negative.   Hematological: Negative.   Psychiatric/Behavioral: Negative.   All other systems reviewed and are negative.      Objective:    Physical Exam Vitals and nursing note reviewed.  Constitutional:      General: He is not in acute distress.    Appearance: Normal appearance. He is normal weight. He is not ill-appearing,  toxic-appearing or diaphoretic.  Skin:    General: Skin is warm.     Coloration: Skin is not jaundiced or pale.     Findings: Erythema present. No bruising, lesion or rash.  Neurological:     General: No focal deficit present.     Mental Status: He is alert  and oriented to person, place, and time. Mental status is at baseline.  Psychiatric:        Mood and Affect: Mood normal.        Behavior: Behavior normal.        Thought Content: Thought content normal.        Judgment: Judgment normal.     BP 132/71   Pulse 78   Temp 98.4 F (36.9 C) (Temporal)   Resp 16   Ht 5\' 7"  (1.702 m)   Wt 190 lb 6.4 oz (86.4 kg)   SpO2 98%   BMI 29.82 kg/m  Wt Readings from Last 3 Encounters:  08/18/19 190 lb 6.4 oz (86.4 kg)  03/12/19 183 lb 3.2 oz (83.1 kg)  10/09/18 181 lb 12.8 oz (82.5 kg)    There are no preventive care reminders to display for this patient.  There are no preventive care reminders to display for this patient.   Lab Results  Component Value Date   TSH 2.910 08/20/2017   Lab Results  Component Value Date   WBC 8.3 03/12/2019   HGB 16.3 03/12/2019   HCT 47.3 03/12/2019   MCV 91 03/12/2019   PLT 218 03/12/2019   Lab Results  Component Value Date   NA 143 07/29/2019   K 4.3 07/29/2019   CO2 28 07/29/2019   GLUCOSE 153 (H) 07/29/2019   BUN 24 07/29/2019   CREATININE 1.48 (H) 07/29/2019   BILITOT 0.5 03/12/2019   ALKPHOS 64 03/12/2019   AST 21 03/12/2019   ALT 22 03/12/2019   PROT 7.2 03/12/2019   ALBUMIN 5.0 (H) 03/12/2019   CALCIUM 9.2 07/29/2019   GFR 61.73 10/03/2010   Lab Results  Component Value Date   CHOL 92 (L) 04/08/2019   Lab Results  Component Value Date   HDL 31 (L) 04/08/2019   Lab Results  Component Value Date   LDLCALC 46 04/08/2019   Lab Results  Component Value Date   TRIG 76 04/08/2019   Lab Results  Component Value Date   CHOLHDL 3.0 04/08/2019   Lab Results  Component Value Date   HGBA1C 5.6 03/12/2019         Assessment & Plan:   Problem List Items Addressed This Visit      Other   Gout - Primary   Relevant Medications   Colchicine 0.6 MG CAPS   traMADol (ULTRAM) 50 MG tablet       Meds ordered this encounter  Medications  . Colchicine 0.6 MG CAPS    Sig: Take 1 tablets on first day of flare up. Then take 1 daily until flare up resolved    Dispense:  8 capsule    Refill:  2    Order Specific Question:   Supervising Provider    Answer:   Delia Chimes A [id number]  . traMADol (ULTRAM) 50 MG tablet    Sig: Take 1 tablet (50 mg total) by mouth every 8 (eight) hours as needed for up to 5 days.    Dispense:  15 tablet    Refill:  0    Order Specific Question:   Supervising Provider    Answer:   Forrest Moron [id number]   PLAN  Will give colchicine with 2 tabs on first day and 1 tab thereafter   Tramadol for pain. Given his medical history, suggest against indomethicin at this time.  He states interest in ED medications - discussed that he should bring this up with his  PCP, Dr. Carlota Raspberry, at a future visit.   Given reasons to return to clinic  Patient encouraged to call clinic with any questions, comments, or concerns.   Maximiano Coss, NP

## 2019-08-18 NOTE — Patient Instructions (Signed)
° ° ° °  If you have lab work done today you will be contacted with your lab results within the next 2 weeks.  If you have not heard from us then please contact us. The fastest way to get your results is to register for My Chart. ° ° °IF you received an x-ray today, you will receive an invoice from [facility]. Please contact [facility] at [phone] with questions or concerns regarding your invoice.  ° °IF you received labwork today, you will receive an invoice from [facility]. Please contact [facility] at [phone] with questions or concerns regarding your invoice.  ° °Our billing staff will not be able to assist you with questions regarding bills from these companies. ° °You will be contacted with the lab results as soon as they are available. The fastest way to get your results is to activate your My Chart account. Instructions are located on the last page of this paperwork. If you have not heard from us regarding the results in 2 weeks, please contact this office. °  ° ° ° °

## 2019-08-19 ENCOUNTER — Ambulatory Visit: Payer: BC Managed Care – PPO | Admitting: Family Medicine

## 2019-08-20 ENCOUNTER — Ambulatory Visit (INDEPENDENT_AMBULATORY_CARE_PROVIDER_SITE_OTHER): Payer: BC Managed Care – PPO | Admitting: Family Medicine

## 2019-08-20 ENCOUNTER — Other Ambulatory Visit: Payer: Self-pay

## 2019-08-20 ENCOUNTER — Encounter: Payer: Self-pay | Admitting: Family Medicine

## 2019-08-20 NOTE — Patient Instructions (Signed)
° ° ° °  If you have lab work done today you will be contacted with your lab results within the next 2 weeks.  If you have not heard from us then please contact us. The fastest way to get your results is to register for My Chart. ° ° °IF you received an x-ray today, you will receive an invoice from [facility]. Please contact [facility] at [phone] with questions or concerns regarding your invoice.  ° °IF you received labwork today, you will receive an invoice from [facility]. Please contact [facility] at [phone] with questions or concerns regarding your invoice.  ° °Our billing staff will not be able to assist you with questions regarding bills from these companies. ° °You will be contacted with the lab results as soon as they are available. The fastest way to get your results is to activate your My Chart account. Instructions are located on the last page of this paperwork. If you have not heard from us regarding the results in 2 weeks, please contact this office. °  ° ° ° °

## 2019-08-20 NOTE — Progress Notes (Signed)
4:16 PM Went to see patient - left without being seen.

## 2019-08-21 ENCOUNTER — Ambulatory Visit: Payer: BC Managed Care – PPO | Admitting: Family Medicine

## 2019-08-21 ENCOUNTER — Ambulatory Visit (INDEPENDENT_AMBULATORY_CARE_PROVIDER_SITE_OTHER): Payer: BC Managed Care – PPO

## 2019-08-21 ENCOUNTER — Encounter: Payer: Self-pay | Admitting: Family Medicine

## 2019-08-21 VITALS — BP 128/75 | HR 64 | Temp 97.2°F | Wt 186.6 lb

## 2019-08-21 DIAGNOSIS — M109 Gout, unspecified: Secondary | ICD-10-CM

## 2019-08-21 DIAGNOSIS — R7989 Other specified abnormal findings of blood chemistry: Secondary | ICD-10-CM

## 2019-08-21 DIAGNOSIS — R739 Hyperglycemia, unspecified: Secondary | ICD-10-CM

## 2019-08-21 DIAGNOSIS — M79675 Pain in left toe(s): Secondary | ICD-10-CM | POA: Diagnosis not present

## 2019-08-21 LAB — BASIC METABOLIC PANEL
BUN/Creatinine Ratio: 12 (ref 10–24)
BUN: 17 mg/dL (ref 8–27)
CO2: 25 mmol/L (ref 20–29)
Calcium: 9.7 mg/dL (ref 8.6–10.2)
Chloride: 97 mmol/L (ref 96–106)
Creatinine, Ser: 1.37 mg/dL — ABNORMAL HIGH (ref 0.76–1.27)
GFR calc Af Amer: 63 mL/min/{1.73_m2} (ref >59)
GFR calc non Af Amer: 55 mL/min/{1.73_m2} — ABNORMAL LOW (ref >59)
Glucose: 104 mg/dL — ABNORMAL HIGH (ref 65–99)
Potassium: 4.1 mmol/L (ref 3.5–5.2)
Sodium: 137 mmol/L (ref 134–144)

## 2019-08-21 LAB — POCT GLYCOSYLATED HEMOGLOBIN (HGB A1C): Hemoglobin A1C: 5.8 % — AB (ref 4.0–5.6)

## 2019-08-21 LAB — GLUCOSE, POCT (MANUAL RESULT ENTRY): POC Glucose: 107 mg/dl — AB (ref 70–99)

## 2019-08-21 MED ORDER — PREDNISONE 20 MG PO TABS: 40.0000 mg | ORAL_TABLET | Freq: Every day | ORAL | 0 refills | Status: DC

## 2019-08-21 NOTE — Telephone Encounter (Signed)
Pt has come to the office and this concern has been addressed.

## 2019-08-21 NOTE — Progress Notes (Signed)
Subjective:  Patient ID: Jeremy Hendricks, male    DOB: [birth year]  Age: 64 y.o. MRN: [id number]  CC:  Chief Complaint  Patient presents with  . Gout    f/u for gout on left foot    HPI Jeremy Hendricks presents for   Gout Telemedicine visit in September.  Intermittent flares previously, has been able to take prednisone 20 mg for resolution with occasional second dose needed.  At that time previous flare was 1 month prior in his ankle.  Uric acid level 9.3 in October 2020.  Allopurinol 100 mg daily started.  Uric acid 7.5 December 16.  He had planned on decreasing alcohol.  Did recommend 6-week follow-up given slight elevated creatinine, glucose on BMP December 16.   Since mid December - ongoing pain, L great toe, and into both heels. more of a flair and at it's worst 4 days ago. Took a few colchicine and 2 indomethacin. Felt 70% better, then sx's returned next day.  Saw Bonne Dolores, NP on1/5 - Rx colchicine and tramadol. Diarrhea started 2 nights ago. Less now.  No fever, no wounds on foot.   No new blurry vision/urinary symptoms.   Results for orders placed or performed in visit on 08/21/19  POCT glucose (manual entry)  Result Value Ref Range   POC Glucose 107 (A) 70 - 99 mg/dl  POCT glycosylated hemoglobin (Hb A1C)  Result Value Ref Range   Hemoglobin A1C 5.8 (A) 4.0 - 5.6 %   HbA1c POC (<> result, manual entry)     HbA1c, POC (prediabetic range)     HbA1c, POC (controlled diabetic range)      History Patient Active Problem List   Diagnosis Date Noted  . Skin fissures 10/09/2018  . Skin infection 10/09/2018  . Chronic diastolic CHF (congestive heart failure) ([place]) 09/23/2018  . CKD (chronic kidney disease) stage 2, GFR 60-89 ml/min 09/23/2018  . History of ETT   . Gout   . DJD (degenerative joint disease)   . Coronary artery disease   . Atrial fibrillation ([place])   . 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 HLD (hyperlipidemia) 06/11/2016  . Hypogonadism in male 09/09/2015  .  INSOMNIA 09/19/2010  . SHORTNESS OF BREATH 09/19/2010  . CHEST PAIN-PRECORDIAL 08/23/2010  . CAD, ARTERY BYPASS GRAFT 10/26/2009  . HYPERLIPIDEMIA 10/24/2009  . DEGENERATIVE JOINT DISEASE 10/24/2009  . ANGINA, HX OF 10/24/2009   Past Medical History:  Diagnosis Date  . Atrial fibrillation ([place])    postoperative  . Chronic diastolic CHF (congestive heart failure) ([place])   . CKD (chronic kidney disease), stage II   . Coronary artery disease    a. s/p CABG 2002. b. s/p redo 2012.  Marland Kitchen DJD (degenerative joint disease)   . Gout   . History of ETT    a. ETT 6/16:  normal   Past Surgical History:  Procedure Laterality Date  . CORONARY ARTERY BYPASS GRAFT  2002  . CORONARY ARTERY BYPASS GRAFT  08-2010   L-LAD remained from original CABG; new grafts incl L radial- PDA + RIMA-RI  . VASECTOMY     No Known Allergies Prior to Admission medications   Medication Sig Start Date End Date Taking? Authorizing Provider  allopurinol (ZYLOPRIM) 100 MG tablet Take 1 tablet (100 mg total) by mouth daily. 06/08/19  Yes Wendie Agreste, MD  aspirin EC 81 MG tablet Take 81 mg daily by mouth.   Yes [provider]  atenolol (TENORMIN) 25 MG tablet TAKE 1  TABLET BY MOUTH EVERY DAY 02/27/19  Yes Dunn, Dayna N, PA-C  Colchicine 0.6 MG CAPS Take 1 tablets on first day of flare up. Then take 1 daily until flare up resolved 08/18/19  Yes Maximiano Coss, NP  emtricitabine-tenofovir (TRUVADA) 200-300 MG tablet TAKE ONE TABLET BY MOUTH ONCE DAILY WITH OR WITHOUT FOOD. STORE IN ORIGINAL CONTAINER AT ROOM TEMPERATURE. 03/12/19  Yes Wendie Agreste, MD  fluticasone [facility]) 50 MCG/ACT nasal spray Place 1 spray into both nostrils daily. 03/12/19  Yes Wendie Agreste, MD  furosemide (LASIX) 40 MG tablet TAKE 1 TABLET BY MOUTH DAILY. ALSO TAKE 1/2 TABLET BY MOUTH DAILY AS NEEDED FOR SWELLING. 03/16/19  Yes Dunn, Dayna N, PA-C  Multiple Vitamin (MULTIVITAMIN) tablet Take 1 tablet by mouth daily.     Yes [provider]  nitroGLYCERIN (NITROSTAT) 0.4 MG SL tablet Place 0.4 mg under the tongue every 5 (five) minutes as needed for chest pain. Reported on 09/21/2015   Yes [provider]  rosuvastatin (CRESTOR) 40 MG tablet TAKE 1 TABLET BY MOUTH EVERY DAY 06/04/19  Yes Dunn, Nedra Hai, PA-C  Testosterone 30 MG/ACT SOLN Apply 30 mg topically daily. 03/09/19  Yes Wendie Agreste, MD  traMADol (ULTRAM) 50 MG tablet Take 1 tablet (50 mg total) by mouth every 8 (eight) hours as needed for up to 5 days. 08/18/19 08/23/19 Yes Maximiano Coss, NP   Social History   Socioeconomic History  . Marital status: Single    Spouse name: Not on file  . Number of children: 2  . Years of education: Not on file  . Highest education level: Not on file  Occupational History  . Occupation: Realtor  Tobacco Use  . Smoking status: Former Smoker    Quit date: 08/14/1999    Years since quitting: 20.0  . Smokeless tobacco: Never Used  Substance and Sexual Activity  . Alcohol use: Yes    Alcohol/week: 14.0 standard drinks    Types: 14 Standard drinks or equivalent per week  . Drug use: No  . Sexual activity: Yes  Other Topics Concern  . Not on file  Social History Narrative   Single   Education: [employer]   Exercise: Yes   Social Determinants of Health   Financial Resource Strain:   . Difficulty of Paying Living Expenses: Not on file  Food Insecurity:   . Worried About Charity fundraiser in the Last Year: Not on file  . Ran Out of Food in the Last Year: Not on file  Transportation Needs:   . Lack of Transportation (Medical): Not on file  . Lack of Transportation (Non-Medical): Not on file  Physical Activity:   . Days of Exercise per Week: Not on file  . Minutes of Exercise per Session: Not on file  Stress:   . Feeling of Stress : Not on file  Social Connections:   . Frequency of Communication with Friends and Family: Not on file  . Frequency of Social Gatherings with Friends and Family: Not on file   . Attends Religious Services: Not on file  . Active Member of Clubs or Organizations: Not on file  . Attends Archivist Meetings: Not on file  . Marital Status: Not on file  Intimate Partner Violence:   . Fear of Current or Ex-Partner: Not on file  . Emotionally Abused: Not on file  . Physically Abused: Not on file  . Sexually Abused: Not on file    Review of  Systems Per HPi.   Objective:   Vitals:   08/21/19 0818  BP: 128/75  Pulse: 64  Temp: (!) 97.2 F (36.2 C)  TempSrc: Temporal  SpO2: 98%  Weight: 186 lb 9.6 oz (84.6 kg)     Physical Exam Constitutional:      General: He is not in acute distress.    Appearance: He is well-developed.  HENT:     Head: Normocephalic and atraumatic.  Cardiovascular:     Rate and Rhythm: Normal rate.  Pulmonary:     Effort: Pulmonary effort is normal.  Abdominal:     General: Abdomen is flat.     Tenderness: There is no abdominal tenderness.  Musculoskeletal:     Left foot: Swelling, tenderness and bony tenderness (Left first MTP with some warmth, swelling, pain with motion.  No wounds.  No rash.) present.  Neurological:     Mental Status: He is alert and oriented to person, place, and time.  Psychiatric:        Mood and Affect: Mood normal.        Behavior: Behavior normal.     DG Toe Great Left  Result Date: 08/21/2019 CLINICAL DATA:  Toe pain EXAM: LEFT GREAT TOE COMPARISON:  03/08/2016 FINDINGS: There is no evidence of fracture or dislocation. There is no evidence of arthropathy or other focal bone abnormality. Soft tissues are unremarkable. IMPRESSION: Negative. Electronically Signed   By: Rolm Baptise M.D.   On: 08/21/2019 09:09    Results for orders placed or performed in visit on 08/21/19  POCT glucose (manual entry)  Result Value Ref Range   POC Glucose 107 (A) 70 - 99 mg/dl  POCT glycosylated hemoglobin (Hb A1C)  Result Value Ref Range   Hemoglobin A1C 5.8 (A) 4.0 - 5.6 %   HbA1c POC (<> result,  manual entry)     HbA1c, POC (prediabetic range)     HbA1c, POC (controlled diabetic range)       Assessment & Plan:  Jeremy Hendricks is a 64 y.o. male . Gout, unspecified cause, unspecified chronicity, unspecified site - Plan: predniSONE (DELTASONE) 20 MG tablet, DG Toe Great Left  Acute gout, unspecified cause, unspecified site - Plan: predniSONE (DELTASONE) 20 MG tablet  Hyperglycemia - Plan: Basic metabolic panel, POCT glucose (manual entry), POCT glycosylated hemoglobin (Hb A1C), CANCELED: Hemoglobin A1c  Elevated serum creatinine - Plan: Basic metabolic panel  Pain of left great toe - Plan: predniSONE (DELTASONE) 20 MG tablet, DG Toe Great Left  Suspected persistent gout flareup, some improvement with colchicine, intolerant with diarrhea recently.  Did appear to tolerate with initial dosing.  -Start prednisone given continued gout flare, 40 mg daily x3 days.  Has tolerated in the past, side effects of been previously discussed.  -Previous hyperglycemia, mild elevation in office today, check A1c.  -Colchicine if needed for repeat flare with 2 pills initially, 1 pill an hour, then up to twice per day for few days if needed, side effects discussed  -If current flare improves next few weeks, can increase allopurinol to 2/day with repeat testing of uric acid in approximately 2 months.  RTC precautions if worsening sooner  -Avoid NSAIDs, repeat creatinine, maintain hydration.  Prediabetes:  -A1c now at prediabetes range.  Advised to watch diet, activity/exercise once gout symptoms improve with repeat testing in the next 3 to 6 months.   Meds ordered this encounter  Medications  . predniSONE (DELTASONE) 20 MG tablet    Sig: Take 2 tablets (  40 mg total) by mouth daily with breakfast.    Dispense:  6 tablet    Refill:  0   Patient Instructions    Prednisone 2 pills for up to 3 days to treat current flare of gout.  I will let you know this morning if there are any concerns on your  blood sugar test.  Once you have been symptom-free for a few weeks, I think it would be reasonable to increase allopurinol to 2 pills/day to lessen risk of recurrence, then recheck uric acid level few weeks later.  If you do have another flare of gout, it is reasonable to take 2 colchicine initially with 1 pill an hour later if needed, then 1 pill up to twice per day for a few days later if needed.  That medicine can cause diarrhea.  I will check kidney function again today, make sure to avoid NSAIDs such as Aleve, ibuprofen and stay hydrated.      Gout  Gout is a condition that causes painful swelling of the joints. Gout is a type of inflammation of the joints (arthritis). This condition is caused by having too much uric acid in the body. Uric acid is a chemical that forms when the body breaks down substances called purines. Purines are important for building body proteins. When the body has too much uric acid, sharp crystals can form and build up inside the joints. This causes pain and swelling. Gout attacks can happen quickly and may be very painful (acute gout). Over time, the attacks can affect more joints and become more frequent (chronic gout). Gout can also cause uric acid to build up under the skin and inside the kidneys. What are the causes? This condition is caused by too much uric acid in your blood. This can happen because:  Your kidneys do not remove enough uric acid from your blood. This is the most common cause.  Your body makes too much uric acid. This can happen with some cancers and cancer treatments. It can also occur if your body is breaking down too many red blood cells (hemolytic anemia).  You eat too many foods that are high in purines. These foods include organ meats and some seafood. Alcohol, especially beer, is also high in purines. A gout attack may be triggered by trauma or stress. What increases the risk? You are more likely to develop this condition if you:  Have  a family history of gout.  Are male and middle-aged.  Are male and have gone through menopause.  Are obese.  Frequently drink alcohol, especially beer.  Are dehydrated.  Lose weight too quickly.  Have an organ transplant.  Have lead poisoning.  Take certain medicines, including aspirin, cyclosporine, diuretics, levodopa, and niacin.  Have kidney disease.  Have a skin condition called psoriasis. What are the signs or symptoms? An attack of acute gout happens quickly. It usually occurs in just one joint. The most common place is the big toe. Attacks often start at night. Other joints that may be affected include joints of the feet, ankle, knee, fingers, wrist, or elbow. Symptoms of this condition may include:  Severe pain.  Warmth.  Swelling.  Stiffness.  Tenderness. The affected joint may be very painful to touch.  Shiny, red, or purple skin.  Chills and fever. Chronic gout may cause symptoms more frequently. More joints may be involved. You may also have white or yellow lumps (tophi) on your hands or feet or in other areas near your  joints. How is this diagnosed? This condition is diagnosed based on your symptoms, medical history, and physical exam. You may have tests, such as:  Blood tests to measure uric acid levels.  Removal of joint fluid with a thin needle (aspiration) to look for uric acid crystals.  X-rays to look for joint damage. How is this treated? Treatment for this condition has two phases: treating an acute attack and preventing future attacks. Acute gout treatment may include medicines to reduce pain and swelling, including:  NSAIDs.  Steroids. These are strong anti-inflammatory medicines that can be taken by mouth (orally) or injected into a joint.  Colchicine. This medicine relieves pain and swelling when it is taken soon after an attack. It can be given by mouth or through an IV. Preventive treatment may include:  Daily use of smaller  doses of NSAIDs or colchicine.  Use of a medicine that reduces uric acid levels in your blood.  Changes to your diet. You may need to see a dietitian about what to eat and drink to prevent gout. Follow these instructions at home: During a gout attack   If directed, put ice on the affected area: ? Put ice in a plastic bag. ? Place a towel between your skin and the bag. ? Leave the ice on for 20 minutes, 2-3 times a day.  Raise (elevate) the affected joint above the level of your heart as often as possible.  Rest the joint as much as possible. If the affected joint is in your leg, you may be given crutches to use.  Follow instructions from your health care provider about eating or drinking restrictions. Avoiding future gout attacks  Follow a low-purine diet as told by your dietitian or health care provider. Avoid foods and drinks that are high in purines, including liver, kidney, anchovies, asparagus, herring, mushrooms, mussels, and beer.  Maintain a healthy weight or lose weight if you are overweight. If you want to lose weight, talk with your health care provider. It is important that you do not lose weight too quickly.  Start or maintain an exercise program as told by your health care provider. Eating and drinking  Drink enough fluids to keep your urine pale yellow.  If you drink alcohol: ? Limit how much you use to:  0-1 drink a day for women.  0-2 drinks a day for men. ? Be aware of how much alcohol is in your drink. In the [place] one drink equals one 12 oz bottle of beer (355 mL) one 5 oz glass of wine (148 mL), or one 1 oz glass of hard liquor (44 mL). General instructions  Take over-the-counter and prescription medicines only as told by your health care provider.  Do not drive or use heavy machinery while taking prescription pain medicine.  Return to your normal activities as told by your health care provider. Ask your health care provider what activities are safe  for you.  Keep all follow-up visits as told by your health care provider. This is important. Contact a health care provider if you have:  Another gout attack.  Continuing symptoms of a gout attack after 10 days of treatment.  Side effects from your medicines.  Chills or a fever.  Burning pain when you urinate.  Pain in your lower back or belly. Get help right away if you:  Have severe or uncontrolled pain.  Cannot urinate. Summary  Gout is painful swelling of the joints caused by inflammation.  The most common site  of pain is the big toe, but it can affect other joints in the body.  Medicines and dietary changes can help to prevent and treat gout attacks. This information is not intended to replace advice given to you by your health care provider. Make sure you discuss any questions you have with your health care provider. Document Revised: 02/19/2018 Document Reviewed: 02/19/2018 Elsevier Patient Education  [employer].    If you have lab work done today you will be contacted with your lab results within the next 2 weeks.  If you have not heard from [place] then please contact us. The fastest way to get your results is to register for My Chart.   IF you received an x-ray today, you will receive an invoice from [facility]. Please contact [facility] at [phone] with questions or concerns regarding your invoice.   IF you received labwork today, you will receive an invoice from [facility]. Please contact [facility] at [phone] with questions or concerns regarding your invoice.   Our billing staff will not be able to assist you with questions regarding bills from these companies.  You will be contacted with the lab results as soon as they are available. The fastest way to get your results is to activate your My Chart account. Instructions are located on the last page of this paperwork. If you have not heard from [place] regarding the results in 2 weeks,  please contact this office.          Signed, [name], MD [facility]

## 2019-08-21 NOTE — Patient Instructions (Addendum)
Prednisone 2 pills for up to 3 days to treat current flare of gout.  I will let you know this morning if there are any concerns on your blood sugar test.  Once you have been symptom-free for a few weeks, I think it would be reasonable to increase allopurinol to 2 pills/day to lessen risk of recurrence, then recheck uric acid level few weeks later.  If you do have another flare of gout, it is reasonable to take 2 colchicine initially with 1 pill an hour later if needed, then 1 pill up to twice per day for a few days later if needed.  That medicine can cause diarrhea.  I will check kidney function again today, make sure to avoid NSAIDs such as Aleve, ibuprofen and stay hydrated.      Gout  Gout is a condition that causes painful swelling of the joints. Gout is a type of inflammation of the joints (arthritis). This condition is caused by having too much uric acid in the body. Uric acid is a chemical that forms when the body breaks down substances called purines. Purines are important for building body proteins. When the body has too much uric acid, sharp crystals can form and build up inside the joints. This causes pain and swelling. Gout attacks can happen quickly and may be very painful (acute gout). Over time, the attacks can affect more joints and become more frequent (chronic gout). Gout can also cause uric acid to build up under the skin and inside the kidneys. What are the causes? This condition is caused by too much uric acid in your blood. This can happen because:  Your kidneys do not remove enough uric acid from your blood. This is the most common cause.  Your body makes too much uric acid. This can happen with some cancers and cancer treatments. It can also occur if your body is breaking down too many red blood cells (hemolytic anemia).  You eat too many foods that are high in purines. These foods include organ meats and some seafood. Alcohol, especially beer, is also high in purines. A  gout attack may be triggered by trauma or stress. What increases the risk? You are more likely to develop this condition if you:  Have a family history of gout.  Are male and middle-aged.  Are male and have gone through menopause.  Are obese.  Frequently drink alcohol, especially beer.  Are dehydrated.  Lose weight too quickly.  Have an organ transplant.  Have lead poisoning.  Take certain medicines, including aspirin, cyclosporine, diuretics, levodopa, and niacin.  Have kidney disease.  Have a skin condition called psoriasis. What are the signs or symptoms? An attack of acute gout happens quickly. It usually occurs in just one joint. The most common place is the big toe. Attacks often start at night. Other joints that may be affected include joints of the feet, ankle, knee, fingers, wrist, or elbow. Symptoms of this condition may include:  Severe pain.  Warmth.  Swelling.  Stiffness.  Tenderness. The affected joint may be very painful to touch.  Shiny, red, or purple skin.  Chills and fever. Chronic gout may cause symptoms more frequently. More joints may be involved. You may also have white or yellow lumps (tophi) on your hands or feet or in other areas near your joints. How is this diagnosed? This condition is diagnosed based on your symptoms, medical history, and physical exam. You may have tests, such as:  Blood tests to measure  uric acid levels.  Removal of joint fluid with a thin needle (aspiration) to look for uric acid crystals.  X-rays to look for joint damage. How is this treated? Treatment for this condition has two phases: treating an acute attack and preventing future attacks. Acute gout treatment may include medicines to reduce pain and swelling, including:  NSAIDs.  Steroids. These are strong anti-inflammatory medicines that can be taken by mouth (orally) or injected into a joint.  Colchicine. This medicine relieves pain and swelling when  it is taken soon after an attack. It can be given by mouth or through an IV. Preventive treatment may include:  Daily use of smaller doses of NSAIDs or colchicine.  Use of a medicine that reduces uric acid levels in your blood.  Changes to your diet. You may need to see a dietitian about what to eat and drink to prevent gout. Follow these instructions at home: During a gout attack   If directed, put ice on the affected area: ? Put ice in a plastic bag. ? Place a towel between your skin and the bag. ? Leave the ice on for 20 minutes, 2-3 times a day.  Raise (elevate) the affected joint above the level of your heart as often as possible.  Rest the joint as much as possible. If the affected joint is in your leg, you may be given crutches to use.  Follow instructions from your health care provider about eating or drinking restrictions. Avoiding future gout attacks  Follow a low-purine diet as told by your dietitian or health care provider. Avoid foods and drinks that are high in purines, including liver, kidney, anchovies, asparagus, herring, mushrooms, mussels, and beer.  Maintain a healthy weight or lose weight if you are overweight. If you want to lose weight, talk with your health care provider. It is important that you do not lose weight too quickly.  Start or maintain an exercise program as told by your health care provider. Eating and drinking  Drink enough fluids to keep your urine pale yellow.  If you drink alcohol: ? Limit how much you use to:  0-1 drink a day for women.  0-2 drinks a day for men. ? Be aware of how much alcohol is in your drink. In the [place] one drink equals one 12 oz bottle of beer (355 mL) one 5 oz glass of wine (148 mL), or one 1 oz glass of hard liquor (44 mL). General instructions  Take over-the-counter and prescription medicines only as told by your health care provider.  Do not drive or use heavy machinery while taking prescription pain  medicine.  Return to your normal activities as told by your health care provider. Ask your health care provider what activities are safe for you.  Keep all follow-up visits as told by your health care provider. This is important. Contact a health care provider if you have:  Another gout attack.  Continuing symptoms of a gout attack after 10 days of treatment.  Side effects from your medicines.  Chills or a fever.  Burning pain when you urinate.  Pain in your lower back or belly. Get help right away if you:  Have severe or uncontrolled pain.  Cannot urinate. Summary  Gout is painful swelling of the joints caused by inflammation.  The most common site of pain is the big toe, but it can affect other joints in the body.  Medicines and dietary changes can help to prevent and treat gout attacks. This  information is not intended to replace advice given to you by your health care provider. Make sure you discuss any questions you have with your health care provider. Document Revised: 02/19/2018 Document Reviewed: 02/19/2018 Elsevier Patient Education  [employer].    If you have lab work done today you will be contacted with your lab results within the next 2 weeks.  If you have not heard from [place] then please contact us. The fastest way to get your results is to register for My Chart.   IF you received an x-ray today, you will receive an invoice from [facility]. Please contact [facility] at [phone] with questions or concerns regarding your invoice.   IF you received labwork today, you will receive an invoice from [place]. Please contact [facility] at [phone] with questions or concerns regarding your invoice.   Our billing staff will not be able to assist you with questions regarding bills from these companies.  You will be contacted with the lab results as soon as they are available. The fastest way to get your results is to activate your My  Chart account. Instructions are located on the last page of this paperwork. If you have not heard from [place] regarding the results in 2 weeks, please contact this office.

## 2019-08-25 ENCOUNTER — Encounter: Payer: Self-pay | Admitting: Family Medicine

## 2019-08-28 ENCOUNTER — Encounter: Payer: Self-pay | Admitting: Family Medicine

## 2019-08-28 ENCOUNTER — Ambulatory Visit (INDEPENDENT_AMBULATORY_CARE_PROVIDER_SITE_OTHER): Payer: BC Managed Care – PPO

## 2019-08-28 ENCOUNTER — Other Ambulatory Visit: Payer: Self-pay

## 2019-08-28 ENCOUNTER — Ambulatory Visit: Payer: BC Managed Care – PPO | Admitting: Family Medicine

## 2019-08-28 VITALS — BP 111/63 | HR 58 | Temp 97.6°F | Ht 67.5 in | Wt 188.8 lb

## 2019-08-28 DIAGNOSIS — M79672 Pain in left foot: Secondary | ICD-10-CM | POA: Diagnosis not present

## 2019-08-28 DIAGNOSIS — R739 Hyperglycemia, unspecified: Secondary | ICD-10-CM | POA: Diagnosis not present

## 2019-08-28 DIAGNOSIS — M79671 Pain in right foot: Secondary | ICD-10-CM | POA: Diagnosis not present

## 2019-08-28 DIAGNOSIS — M109 Gout, unspecified: Secondary | ICD-10-CM

## 2019-08-28 DIAGNOSIS — R202 Paresthesia of skin: Secondary | ICD-10-CM

## 2019-08-28 DIAGNOSIS — R200 Anesthesia of skin: Secondary | ICD-10-CM

## 2019-08-28 LAB — GLUCOSE, POCT (MANUAL RESULT ENTRY): POC Glucose: 94 mg/dl (ref 70–99)

## 2019-08-28 MED ORDER — PREDNISONE 20 MG PO TABS: ORAL_TABLET | ORAL | 0 refills | Status: DC

## 2019-08-28 MED ORDER — TRAMADOL HCL 50 MG PO TABS: 50.0000 mg | ORAL_TABLET | Freq: Four times a day (QID) | ORAL | 0 refills | Status: DC | PRN

## 2019-08-28 NOTE — Progress Notes (Signed)
Subjective:  Patient ID: Jeremy Hendricks, male    DOB: [birth year]  Age: 64 y.o. MRN: [id number]  CC:  Chief Complaint  Patient presents with  . left gout pain on legs and feet    foot pain. 1 month. gout flair up. Referral request to foot dr    HPI Jeremy Hendricks presents for  Follow-up gout.  See most recent visit January 8.  Intermittent flares of gout last year, had previously been able to take prednisone 20 mg for resolution with an occasional second dose as needed.  Uric last level 9.3 in October 2020, allopurinol 100 mg started.  Uric acid level 7.5 December 16.  Also plan to decrease alcohol.  Ongoing pain in his left great toe since December and into both heels.  Worse 4 days prior to last visit.  Saw my colleague who started him on colchicine and tramadol.  Had taken colchicine, indomethacin prior to that visit with some improvement then return of symptoms.  With persistent gout flare he was started on prednisone, 40 mg daily x3 days.  A1c is okay with previous hyperglycemia.  Colchicine if needed for repeat flare.  See my chart message.  Has had persistent pain.   Pain constant 2-3 all the time. Some improvement on prednisone, returned off prednisone.  Taking 3 cochicine per day at this point. No diarrhea.  Tramadol every 6 hours. Some improvement, but now has numbness and tingling in both feet in toes and plantar foot- left greater than right.  Feels like heels more sore  - heel spurs in 30's.  Hobbling when getting up at night. Tendon feels tight at ankle. Feet feel tight.     Lab Results  Component Value Date   HGBA1C 5.8 (A) 08/21/2019     Lab Results  Component Value Date   LABURIC 7.5 07/29/2019        History Patient Active Problem List   Diagnosis Date Noted  . Skin fissures 10/09/2018  . Skin infection 10/09/2018  . Chronic diastolic CHF (congestive heart failure) ([place]) 09/23/2018  . CKD (chronic kidney disease) stage 2, GFR 60-89 ml/min 09/23/2018   . History of ETT   . Gout   . DJD (degenerative joint disease)   . Coronary artery disease   . Atrial fibrillation ([place])   . 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 HLD (hyperlipidemia) 06/11/2016  . Hypogonadism in male 09/09/2015  . INSOMNIA 09/19/2010  . SHORTNESS OF BREATH 09/19/2010  . CHEST PAIN-PRECORDIAL 08/23/2010  . CAD, ARTERY BYPASS GRAFT 10/26/2009  . HYPERLIPIDEMIA 10/24/2009  . DEGENERATIVE JOINT DISEASE 10/24/2009  . ANGINA, HX OF 10/24/2009   Past Medical History:  Diagnosis Date  . Atrial fibrillation ([place])    postoperative  . Chronic diastolic CHF (congestive heart failure) ([place])   . CKD (chronic kidney disease), stage II   . Coronary artery disease    a. s/p CABG 2002. b. s/p redo 2012.  Marland Kitchen DJD (degenerative joint disease)   . Gout   . History of ETT    a. ETT 6/16:  normal   Past Surgical History:  Procedure Laterality Date  . CORONARY ARTERY BYPASS GRAFT  2002  . CORONARY ARTERY BYPASS GRAFT  08-2010   L-LAD remained from original CABG; new grafts incl L radial- PDA + RIMA-RI  . VASECTOMY     No Known Allergies Prior to Admission medications   Medication Sig Start Date End Date Taking? Authorizing Provider  allopurinol (ZYLOPRIM) 100 MG tablet Take 1  tablet (100 mg total) by mouth daily. 06/08/19  Yes Wendie Agreste, MD  aspirin EC 81 MG tablet Take 81 mg daily by mouth.   Yes [provider]  atenolol (TENORMIN) 25 MG tablet TAKE 1 TABLET BY MOUTH EVERY DAY 02/27/19  Yes Dunn, Dayna N, PA-C  Colchicine 0.6 MG CAPS Take 1 tablets on first day of flare up. Then take 1 daily until flare up resolved 08/18/19  Yes Maximiano Coss, NP  fluticasone [facility]) 50 MCG/ACT nasal spray Place 1 spray into both nostrils daily. 03/12/19  Yes Wendie Agreste, MD  furosemide (LASIX) 40 MG tablet TAKE 1 TABLET BY MOUTH DAILY. ALSO TAKE 1/2 TABLET BY MOUTH DAILY AS NEEDED FOR SWELLING. 03/16/19  Yes Dunn, Dayna N, PA-C  Multiple Vitamin (MULTIVITAMIN) tablet Take 1 tablet  by mouth daily.     Yes [provider]  nitroGLYCERIN (NITROSTAT) 0.4 MG SL tablet Place 0.4 mg under the tongue every 5 (five) minutes as needed for chest pain. Reported on 09/21/2015   Yes [provider]  rosuvastatin (CRESTOR) 40 MG tablet TAKE 1 TABLET BY MOUTH EVERY DAY 06/04/19  Yes Dunn, Nedra Hai, PA-C  Testosterone 30 MG/ACT SOLN Apply 30 mg topically daily. 03/09/19  Yes Wendie Agreste, MD  emtricitabine-tenofovir (TRUVADA) 200-300 MG tablet TAKE ONE TABLET BY MOUTH ONCE DAILY WITH OR WITHOUT FOOD. STORE IN ORIGINAL CONTAINER AT ROOM TEMPERATURE. Patient not taking: Reported on 08/28/2019 03/12/19   Wendie Agreste, MD  predniSONE (DELTASONE) 20 MG tablet Take 2 tablets (40 mg total) by mouth daily with breakfast. Patient not taking: Reported on 08/28/2019 08/21/19   Wendie Agreste, MD   Social History   Socioeconomic History  . Marital status: Single    Spouse name: Not on file  . Number of children: 2  . Years of education: Not on file  . Highest education level: Not on file  Occupational History  . Occupation: Realtor  Tobacco Use  . Smoking status: Former Smoker    Quit date: 08/14/1999    Years since quitting: 20.0  . Smokeless tobacco: Never Used  Substance and Sexual Activity  . Alcohol use: Yes    Alcohol/week: 14.0 standard drinks    Types: 14 Standard drinks or equivalent per week  . Drug use: No  . Sexual activity: Yes  Other Topics Concern  . Not on file  Social History Narrative   Single   Education: [employer]   Exercise: Yes   Social Determinants of Health   Financial Resource Strain:   . Difficulty of Paying Living Expenses: Not on file  Food Insecurity:   . Worried About Charity fundraiser in the Last Year: Not on file  . Ran Out of Food in the Last Year: Not on file  Transportation Needs:   . Lack of Transportation (Medical): Not on file  . Lack of Transportation (Non-Medical): Not on file  Physical Activity:   . Days of  Exercise per Week: Not on file  . Minutes of Exercise per Session: Not on file  Stress:   . Feeling of Stress : Not on file  Social Connections:   . Frequency of Communication with Friends and Family: Not on file  . Frequency of Social Gatherings with Friends and Family: Not on file  . Attends Religious Services: Not on file  . Active Member of Clubs or Organizations: Not on file  . Attends Archivist Meetings: Not on file  . Marital  Status: Not on file  Intimate Partner Violence:   . Fear of Current or Ex-Partner: Not on file  . Emotionally Abused: Not on file  . Physically Abused: Not on file  . Sexually Abused: Not on file    Review of Systems   Objective:   Vitals:   08/28/19 1212  BP: 111/63  Pulse: (!) 58  Temp: 97.6 F (36.4 C)  TempSrc: Temporal  SpO2: 98%  Weight: 188 lb 12.8 oz (85.6 kg)  Height: 5' 7.5" (1.715 m)     Physical Exam Constitutional:      General: He is not in acute distress.    Appearance: He is well-developed.  HENT:     Head: Normocephalic and atraumatic.  Cardiovascular:     Rate and Rhythm: Normal rate.  Pulmonary:     Effort: Pulmonary effort is normal.  Musculoskeletal:        General: Swelling and tenderness present.       Legs:     Comments: Bilateral feet DP pulse 2+ bilaterally, cap refill less than 1 second, skin intact distally.  Tender to palpation with slight warmth over the left first MTP, slight soft tissue swelling with slight swelling into the dorsal midfoot on the left only.  No wounds.  No surrounding erythema.  Slight dysesthesias into tenderness, sensate into the midfoot.  Achilles nontender, calcaneus nontender, no midfoot, hindfoot swelling or swelling of the Achilles area.  Neurological:     Mental Status: He is alert and oriented to person, place, and time.    Results for orders placed or performed in visit on 08/28/19  POCT glucose (manual entry)  Result Value Ref Range   POC Glucose 94 70 - 99  mg/dl   DG Foot Complete Left  Result Date: 08/28/2019 CLINICAL DATA:  Pain.  History of gout EXAM: LEFT FOOT - COMPLETE 3+ VIEW COMPARISON:  Left first toe August 21, 2019 FINDINGS: Frontal, oblique, and lateral views obtained. No fracture or dislocation. There is a small erosion along the medial distal first metatarsal, best appreciated on the oblique view. There may be earliest changes of erosion along the medial most aspect of the first proximal phalanx. No appreciable joint space narrowing. Other joint spaces appear unremarkable. IMPRESSION: There is a degree of erosive arthropathy in the first MTP joint, likely due to gout. No appreciable joint space narrowing. Other joint spaces elsewhere appear normal. No fracture or dislocation. Electronically Signed   By: Lowella Grip III M.D.   On: 08/28/2019 13:35   DG Foot Complete Right  Result Date: 08/28/2019 CLINICAL DATA:  Bilateral foot pain.  Gout flare. EXAM: RIGHT FOOT COMPLETE - 3+ VIEW COMPARISON:  No prior. FINDINGS: Tiny punctate bony density noted adjacent to the distal aspect of the right fifth proximal phalanx. This may be a tiny chip fracture, possibly old. No other focal bony abnormalities identified. No joint abnormalities identified. No radiopaque foreign body. IMPRESSION: Tiny punctate bony density noted adjacent to the distal aspect of the right fifth proximal phalanx. This may be a tiny chip fracture, possibly old. No other abnormalities identified. Electronically Signed   By: Marcello Moores  Register   On: 08/28/2019 13:39   DG Toe Great Left  Result Date: 08/21/2019 CLINICAL DATA:  Toe pain EXAM: LEFT GREAT TOE COMPARISON:  03/08/2016 FINDINGS: There is no evidence of fracture or dislocation. There is no evidence of arthropathy or other focal bone abnormality. Soft tissues are unremarkable. IMPRESSION: Negative. Electronically Signed   By: Lennette Bihari  [place] M.D.   On: 08/21/2019 09:09     Assessment & Plan:  LUMEN GIRARD is a 64 y.o.  male . Bilateral foot pain - Plan: DG Foot Complete Left, DG Foot Complete Right  Acute gout, unspecified cause, unspecified site - Plan: DG Foot Complete Left, DG Foot Complete Right, Ambulatory referral to Podiatry, Ambulatory referral to Rheumatology, traMADol (ULTRAM) 50 MG tablet, predniSONE (DELTASONE) 20 MG tablet  Hyperglycemia - Plan: POCT glucose (manual entry)  Numbness and tingling of foot  Suspected persistent gout flare, potentially dysesthesias with swelling.  No apparent sign of infection, x-rays reassuring with possible early erosion at MTP, likely gout related.  -Restart prednisone, higher dose initially with taper.  Continue tramadol for pain.  Refer to both podiatry and rheumatology to discuss further treatment/evaluation.  RTC precautions if not improving into next week as advanced imaging may be needed.   Meds ordered this encounter  Medications  . traMADol (ULTRAM) 50 MG tablet    Sig: Take 1 tablet (50 mg total) by mouth every 6 (six) hours as needed.    Dispense:  20 tablet    Refill:  0  . predniSONE (DELTASONE) 20 MG tablet    Sig: 3 by mouth for 3 days, then 2 by mouth for 2 days, then 1 by mouth for 2 days, then 1/2 by mouth for 2 days.    Dispense:  16 tablet    Refill:  0   Patient Instructions     I will refer you to podiatry as well as rheumatology.  This still could be a flare of gout.  Try higher dose of prednisone with a taper as we discussed.  Tramadol if needed for pain.  Let me know how things are going in the next 1 week.   Podiatry, rheumatology can also evaluate the foot/ankle symptoms, but I would like to see if the tingling/numbness may be related to knee pain and swelling.  Return to the clinic or go to the nearest emergency room if any of your symptoms worsen or new symptoms occur.    If you have lab work done today you will be contacted with your lab results within the next 2 weeks.  If you have not heard from [place] then please contact  us. The fastest way to get your results is to register for My Chart.   IF you received an x-ray today, you will receive an invoice from [facility]. Please contact [facility] at [phone] with questions or concerns regarding your invoice.   IF you received labwork today, you will receive an invoice from [place]. Please contact [facility] at [phone] with questions or concerns regarding your invoice.   Our billing staff will not be able to assist you with questions regarding bills from these companies.  You will be contacted with the lab results as soon as they are available. The fastest way to get your results is to activate your My Chart account. Instructions are located on the last page of this paperwork. If you have not heard from [place] regarding the results in 2 weeks, please contact this office.          Signed, [name], MD [facility]

## 2019-08-28 NOTE — Telephone Encounter (Signed)
Seen in office today  

## 2019-08-28 NOTE — Patient Instructions (Addendum)
   I will refer you to podiatry as well as rheumatology.  This still could be a flare of gout.  Try higher dose of prednisone with a taper as we discussed.  Tramadol if needed for pain.  Let me know how things are going in the next 1 week.   Podiatry, rheumatology can also evaluate the foot/ankle symptoms, but I would like to see if the tingling/numbness may be related to knee pain and swelling.  Return to the clinic or go to the nearest emergency room if any of your symptoms worsen or new symptoms occur.    If you have lab work done today you will be contacted with your lab results within the next 2 weeks.  If you have not heard from [place] then please contact us. The fastest way to get your results is to register for My Chart.   IF you received an x-ray today, you will receive an invoice from [facility]. Please contact [facility] at [phone] with questions or concerns regarding your invoice.   IF you received labwork today, you will receive an invoice from [facility]. Please contact [facility] at [phone] with questions or concerns regarding your invoice.   Our billing staff will not be able to assist you with questions regarding bills from these companies.  You will be contacted with the lab results as soon as they are available. The fastest way to get your results is to activate your My Chart account. Instructions are located on the last page of this paperwork. If you have not heard from [place] regarding the results in 2 weeks, please contact this office.

## 2019-08-29 ENCOUNTER — Encounter: Payer: Self-pay | Admitting: Family Medicine

## 2019-09-09 ENCOUNTER — Other Ambulatory Visit: Payer: Self-pay | Admitting: Family Medicine

## 2019-09-09 DIAGNOSIS — R7989 Other specified abnormal findings of blood chemistry: Secondary | ICD-10-CM

## 2019-09-09 DIAGNOSIS — E291 Testicular hypofunction: Secondary | ICD-10-CM

## 2019-09-09 NOTE — Telephone Encounter (Signed)
Requested medication (s) are due for refill today: yes  Requested medication (s) are on the active medication list: yes  Last refill: 07/08/2019  Future visit scheduled: yes  Notes to clinic:  Medication not assigned to a protocol, review manually   Requested Prescriptions  Pending Prescriptions Disp Refills   Testosterone 30 MG/ACT SOLN [Pharmacy Med Name: TESTOSTERONE 30 MG/1.5 ML PUMP] 90 mL     Sig: APPLY 1 PUMP EVERY DAY      Off-Protocol Failed - 09/09/2019  1:28 AM      Failed - Medication not assigned to a protocol, review manually.      Passed - Valid encounter within last 12 months    Recent Outpatient Visits           1 week ago Bilateral foot pain   [facility], Ranell Patrick, MD   2 weeks ago Gout, unspecified cause, unspecified chronicity, unspecified site   [facility], Ranell Patrick, MD   2 weeks ago    [facility], Ranell Patrick, MD   3 weeks ago Gout, unspecified cause, unspecified chronicity, unspecified site   [facility], Delfino Lovett, NP   3 months ago Acute gout, unspecified cause, unspecified site   [facility], Ranell Patrick, MD       Future Appointments             In 2 weeks [facility] Jennye Moccasin, PA-C [place]   In 1 month Carlota Raspberry, Ranell Patrick, MD [facility] [place], [facility]

## 2019-09-09 NOTE — Telephone Encounter (Signed)
Patient is requesting a refill of the following medications: Requested Prescriptions   Pending Prescriptions Disp Refills   Testosterone 30 MG/ACT SOLN [Pharmacy Med Name: TESTOSTERONE 30 MG/1.5 ML PUMP] 90 mL     Sig: APPLY 1 PUMP EVERY DAY    Date of patient request: 09/09/19 Last office visit: 08/28/19 Date of last refill: 03/09/19 Last refill amount: 90 ml Follow up time period per chart: 10/19/19

## 2019-09-09 NOTE — Telephone Encounter (Signed)
Controlled substance database reviewed.  Last lab work to monitor March 12, 2019.   Please schedule follow-up visit in the next month so we can repeat monitoring tests.  I did refill medication for now.  Thanks.

## 2019-09-10 ENCOUNTER — Ambulatory Visit: Payer: BC Managed Care – PPO | Admitting: Family Medicine

## 2019-09-10 NOTE — Telephone Encounter (Signed)
Pt has f/u appt in March

## 2019-09-11 ENCOUNTER — Other Ambulatory Visit: Payer: Self-pay

## 2019-09-11 ENCOUNTER — Ambulatory Visit: Payer: BC Managed Care – PPO | Admitting: Podiatry

## 2019-09-11 ENCOUNTER — Ambulatory Visit: Payer: BC Managed Care – PPO

## 2019-09-11 DIAGNOSIS — R202 Paresthesia of skin: Secondary | ICD-10-CM | POA: Diagnosis not present

## 2019-09-11 DIAGNOSIS — R200 Anesthesia of skin: Secondary | ICD-10-CM | POA: Diagnosis not present

## 2019-09-11 DIAGNOSIS — M79672 Pain in left foot: Secondary | ICD-10-CM

## 2019-09-11 DIAGNOSIS — M79671 Pain in right foot: Secondary | ICD-10-CM

## 2019-09-12 ENCOUNTER — Other Ambulatory Visit: Payer: Self-pay | Admitting: Physician Assistant

## 2019-09-12 ENCOUNTER — Other Ambulatory Visit: Payer: Self-pay | Admitting: Family Medicine

## 2019-09-12 DIAGNOSIS — M109 Gout, unspecified: Secondary | ICD-10-CM

## 2019-09-15 ENCOUNTER — Encounter: Payer: Self-pay | Admitting: Podiatry

## 2019-09-15 NOTE — Progress Notes (Signed)
Subjective:  Patient ID: Jeremy Hendricks, male    DOB: [birth year],  MRN: [id number]   64 y.o. male presents with the above complaint.  Patient presents with a complaint of bilateral lower extremity numbness to the distal digits.  Patient states that this has been going on for a long period of time ever since he had a heart surgery.  Patient states the numbness is often worse at nighttime.  Patient has done blood test to see was going on including diabetes however patient is not a diabetic.  He states that he also has lower lumbar pain that could be the result of the numbness/neuropathy.  He denies any other acute complaints.  He would like to know if there is anything that could be done for this.  Patient had done/gone x-ray in an outpatient setting not at our office.   Review of Systems: Negative except as noted in the HPI. Denies N/V/F/Ch.  Past Medical History:  Diagnosis Date  . Atrial fibrillation ([place])    postoperative  . Chronic diastolic CHF (congestive heart failure) ([place])   . CKD (chronic kidney disease), stage II   . Coronary artery disease    a. s/p CABG 2002. b. s/p redo 2012.  Marland Kitchen DJD (degenerative joint disease)   . Gout   . History of ETT    a. ETT 6/16:  normal    Current Outpatient Medications:  .  aspirin EC 81 MG tablet, Take 81 mg daily by mouth., Disp: , Rfl:  .  atenolol (TENORMIN) 25 MG tablet, TAKE 1 TABLET BY MOUTH EVERY DAY, Disp: 90 tablet, Rfl: 3 .  Colchicine 0.6 MG CAPS, Take 1 tablets on first day of flare up. Then take 1 daily until flare up resolved, Disp: 8 capsule, Rfl: 2 .  emtricitabine-tenofovir (TRUVADA) 200-300 MG tablet, TAKE ONE TABLET BY MOUTH ONCE DAILY WITH OR WITHOUT FOOD. STORE IN ORIGINAL CONTAINER AT ROOM TEMPERATURE., Disp: 90 tablet, Rfl: 2 .  fluticasone (FLONASE) 50 MCG/ACT nasal spray, Place 1 spray into both nostrils daily., Disp: 16 g, Rfl: 6 .  Multiple Vitamin (MULTIVITAMIN) tablet, Take 1 tablet by mouth daily.  , Disp: , Rfl:  .   nitroGLYCERIN (NITROSTAT) 0.4 MG SL tablet, Place 0.4 mg under the tongue every 5 (five) minutes as needed for chest pain. Reported on 09/21/2015, Disp: , Rfl:  .  predniSONE (DELTASONE) 20 MG tablet, 3 by mouth for 3 days, then 2 by mouth for 2 days, then 1 by mouth for 2 days, then 1/2 by mouth for 2 days., Disp: 16 tablet, Rfl: 0 .  rosuvastatin (CRESTOR) 40 MG tablet, TAKE 1 TABLET BY MOUTH EVERY DAY, Disp: 90 tablet, Rfl: 0 .  Testosterone 30 MG/ACT SOLN, APPLY 1 PUMP EVERY DAY, Disp: 90 mL, Rfl: 0 .  traMADol (ULTRAM) 50 MG tablet, Take 1 tablet (50 mg total) by mouth every 6 (six) hours as needed., Disp: 20 tablet, Rfl: 0 .  allopurinol (ZYLOPRIM) 100 MG tablet, TAKE 1 TABLET BY MOUTH EVERY DAY, Disp: 90 tablet, Rfl: 1 .  furosemide (LASIX) 40 MG tablet, Take 1 tablet by mouth daily. Also take 1/2 tablet by mouth as needed for swelling. Please keep upcoming appt in February for future refills. Thank you, Disp: 135 tablet, Rfl: 0  Social History   Tobacco Use  Smoking Status Former Smoker  . Quit date: 08/14/1999  . Years since quitting: 20.1  Smokeless Tobacco Never Used    No Known Allergies Objective:  There  were no vitals filed for this visit. There is no height or weight on file to calculate BMI. Constitutional Well developed. Well nourished.  Vascular Dorsalis pedis pulses palpable bilaterally. Posterior tibial pulses palpable bilaterally. Capillary refill normal to all digits.  No cyanosis or clubbing noted. Pedal hair growth normal.  Neurologic Normal speech. Oriented to person, place, and time. Epicritic sensation to light touch grossly present bilaterally.  Dermatologic Nails well groomed and normal in appearance. No open wounds. No skin lesions.  Orthopedic:  Protective sensation intact up to the metatarsal heads bilaterally.  There is decrease in protective sensation with monofilament testing to the distal tips of all the digits bilaterally.  Negative tarsal tunnel  sign.  Negative Tinel.   Radiographs: Tiny punctate bony density noted adjacent to the distal aspect of the right fifth proximal phalanx. This may be a tiny chip fracture, possibly old. No other focal bony abnormalities identified. No joint abnormalities identified. No radiopaque foreign body.  IMPRESSION: Tiny punctate bony density noted adjacent to the distal aspect of the right fifth proximal phalanx. This may be a tiny chip fracture, possibly old. No other abnormalities identified. Assessment:   1. Foot pain, bilateral   2. Numbness and tingling of both feet    Plan:  Patient was evaluated and treated and all questions answered.  Bilateral numbness to the distal digits/tingling -I explained to the patient the etiology of numbness/tingling and various treatment options associated with it.  Unfortunately patient has not been able to get a proper work-up to evaluate the etiology of the distal digit numbness. 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  This could also be an etiology of distal tip neuropathy/tingling.  Patient will follow up with the spine specialist to get spine evaluated. No follow-ups on file.

## 2019-09-17 ENCOUNTER — Other Ambulatory Visit: Payer: Self-pay | Admitting: Family Medicine

## 2019-09-17 ENCOUNTER — Encounter: Payer: Self-pay | Admitting: Family Medicine

## 2019-09-17 DIAGNOSIS — M109 Gout, unspecified: Secondary | ICD-10-CM

## 2019-09-17 MED ORDER — PREDNISONE 20 MG PO TABS: ORAL_TABLET | ORAL | 0 refills | Status: DC

## 2019-09-17 NOTE — Telephone Encounter (Signed)
Patient is requesting a refill of the following medications: Requested Prescriptions   Pending Prescriptions Disp Refills   predniSONE (DELTASONE) 20 MG tablet 16 tablet 0    Sig: 3 by mouth for 3 days, then 2 by mouth for 2 days, then 1 by mouth for 2 days, then 1/2 by mouth for 2 days.    Date of patient request: 09/17/19 Last office visit: 08/28/19 Date of last refill: 08/28/19 Last refill amount:16  Follow up time period per chart: 10/19/19

## 2019-09-21 ENCOUNTER — Telehealth: Payer: Self-pay | Admitting: *Deleted

## 2019-09-21 DIAGNOSIS — R202 Paresthesia of skin: Secondary | ICD-10-CM

## 2019-09-21 DIAGNOSIS — M79671 Pain in right foot: Secondary | ICD-10-CM

## 2019-09-21 DIAGNOSIS — M79672 Pain in left foot: Secondary | ICD-10-CM

## 2019-09-21 DIAGNOSIS — R200 Anesthesia of skin: Secondary | ICD-10-CM

## 2019-09-21 NOTE — Telephone Encounter (Signed)
-----   Message from Jeremy Hendricks, DPM sent at 09/15/2019  8:40 AM EST ----- Regarding: Spine specialist referral Hi Valerya Maxton,  Can you refer this patient to a spine specialist to have his lumbar spine evaluated for a pinched nerve that could be affecting his bilateral foot to the lower extremity.  Thanks Lennette Bihari

## 2019-09-22 NOTE — Progress Notes (Signed)
Cardiology Office Note    Date:  09/23/2019   ID:  Jeremy Hendricks, DOB Dec 27, 1955, MRN [id number]  PCP:  Wendie Agreste, MD  Cardiologist: Lauree Chandler, MD EPS: None  Chief Complaint  Patient presents with  . Follow-up    History of Present Illness:  Jeremy Hendricks is a 64 y.o. male with history of CAD status post CABG in 2002 redo 2012, postop A. fib, chronic diastolic CHF.  Echo 2014 normal LVEF, normal GXT 01/2015 CKD last creatinine 1.23-03/2018.   I last saw the patient 09/23/18 and he was having some trouble with swelling. 2Decho 10/08/18 normal LVEF [id number] with diastolic dysfunction.  Patient denies chest pain, dyspnea, palpitations. Occasional dizziness if gets up quickly. Does has significant trouble with gout pain. Some swelling with gout. Rides stationary bike and rowing machine 45 min every other day.    Past Medical History:  Diagnosis Date  . Atrial fibrillation ([place])    postoperative  . Chronic diastolic CHF (congestive heart failure) ([place])   . CKD (chronic kidney disease), stage II   . Coronary artery disease    a. s/p CABG 2002. b. s/p redo 2012.  Marland Kitchen DJD (degenerative joint disease)   . Gout   . History of ETT    a. ETT 6/16:  normal    Past Surgical History:  Procedure Laterality Date  . CORONARY ARTERY BYPASS GRAFT  2002  . CORONARY ARTERY BYPASS GRAFT  08-2010   L-LAD remained from original CABG; new grafts incl L radial- PDA + RIMA-RI  . VASECTOMY      Current Medications: Current Meds  Medication Sig  . allopurinol (ZYLOPRIM) 100 MG tablet TAKE 1 TABLET BY MOUTH EVERY DAY  . aspirin EC 81 MG tablet Take 81 mg daily by mouth.  Marland Kitchen atenolol (TENORMIN) 25 MG tablet TAKE 1 TABLET BY MOUTH EVERY DAY  . Colchicine 0.6 MG CAPS Take 1 tablets on first day of flare up. Then take 1 daily until flare up resolved  . emtricitabine-tenofovir (TRUVADA) 200-300 MG tablet TAKE ONE TABLET BY MOUTH ONCE DAILY WITH OR WITHOUT FOOD. STORE IN ORIGINAL  CONTAINER AT ROOM TEMPERATURE.  . fluticasone (FLONASE) 50 MCG/ACT nasal spray Place 1 spray into both nostrils daily.  . furosemide (LASIX) 40 MG tablet Take 1 tablet by mouth daily. Also take 1/2 tablet by mouth as needed for swelling. Please keep upcoming appt in February for future refills. Thank you  . Multiple Vitamin (MULTIVITAMIN) tablet Take 1 tablet by mouth daily.    . nitroGLYCERIN (NITROSTAT) 0.4 MG SL tablet Place 0.4 mg under the tongue every 5 (five) minutes as needed for chest pain. Reported on 09/21/2015  . predniSONE (DELTASONE) 20 MG tablet 3 by mouth for 3 days, then 2 by mouth for 2 days, then 1 by mouth for 2 days, then 1/2 by mouth for 2 days.  . rosuvastatin (CRESTOR) 40 MG tablet Take 1 tablet (40 mg total) by mouth daily.  . Testosterone 30 MG/ACT SOLN APPLY 1 PUMP EVERY DAY  . traMADol (ULTRAM) 50 MG tablet Take 1 tablet (50 mg total) by mouth every 6 (six) hours as needed.  . [DISCONTINUED] rosuvastatin (CRESTOR) 40 MG tablet TAKE 1 TABLET BY MOUTH EVERY DAY     Allergies:   Patient has no known allergies.   Social History   Socioeconomic History  . Marital status: Single    Spouse name: Not on file  . Number of children: 2  . Years  of education: Not on file  . Highest education level: Not on file  Occupational History  . Occupation: Realtor  Tobacco Use  . Smoking status: Former Smoker    Quit date: 08/14/1999    Years since quitting: 20.1  . Smokeless tobacco: Never Used  Substance and Sexual Activity  . Alcohol use: Yes    Alcohol/week: 14.0 standard drinks    Types: 14 Standard drinks or equivalent per week  . Drug use: No  . Sexual activity: Yes  Other Topics Concern  . Not on file  Social History Narrative   Single   Education: [employer]   Exercise: Yes   Social Determinants of Health   Financial Resource Strain:   . Difficulty of Paying Living Expenses: Not on file  Food Insecurity:   . Worried About Charity fundraiser in the Last Year:  Not on file  . Ran Out of Food in the Last Year: Not on file  Transportation Needs:   . Lack of Transportation (Medical): Not on file  . Lack of Transportation (Non-Medical): Not on file  Physical Activity:   . Days of Exercise per Week: Not on file  . Minutes of Exercise per Session: Not on file  Stress:   . Feeling of Stress : Not on file  Social Connections:   . Frequency of Communication with Friends and Family: Not on file  . Frequency of Social Gatherings with Friends and Family: Not on file  . Attends Religious Services: Not on file  . Active Member of Clubs or Organizations: Not on file  . Attends Archivist Meetings: Not on file  . Marital Status: Not on file     Family History:  The patient's family history includes Cancer in his mother and unknown relative; Coronary artery disease in his unknown relative; Heart attack (age of onset: 30) in his father; Hyperlipidemia in his unknown relative; Hypertension in his father and paternal grandfather.   ROS:   Please see the history of present illness.    ROS All other systems reviewed and are negative.   PHYSICAL EXAM:   VS:  BP 108/62   Pulse 62   Ht 5' 7.5" (1.715 m)   Wt 186 lb (84.4 kg)   SpO2 97%   BMI 28.70 kg/m   Physical Exam  GEN: Well nourished, well developed, in no acute distress  Neck: no JVD, carotid bruits, or masses Cardiac:RRR; no murmurs, rubs, or gallops  Respiratory:  clear to auscultation bilaterally, normal work of breathing GI: soft, nontender, nondistended, + BS Ext: without cyanosis, clubbing, or edema, Good distal pulses bilaterally Neuro:  Alert and Oriented x 3 Psych: euthymic mood, full affect  Wt Readings from Last 3 Encounters:  09/23/19 186 lb (84.4 kg)  08/28/19 188 lb 12.8 oz (85.6 kg)  08/21/19 186 lb 9.6 oz (84.6 kg)      Studies/Labs Reviewed:   EKG:  EKG is  ordered today.  The ekg ordered today demonstrates normal sinus rhythm with first-degree AV block, T wave  inversion anteriorly, no change from prior EKGs  Recent Labs: 03/12/2019: ALT 22; Hemoglobin 16.3; Platelets 218 08/21/2019: BUN 17; Creatinine, Ser 1.37; Potassium 4.1; Sodium 137   Lipid Panel    Component Value Date/Time   CHOL 92 (L) 04/08/2019 0744   TRIG 76 04/08/2019 0744   HDL 31 (L) 04/08/2019 0744   CHOLHDL 3.0 04/08/2019 0744   CHOLHDL 3.7 02/03/2016 0839   VLDL 21 02/03/2016 [id number]  [place] 46 04/08/2019 0744    Additional studies/ records that were reviewed today include:  2D echo 10/08/2018  IMPRESSIONS     1. The left ventricle has normal systolic function with an ejection  fraction of 60-65%. The cavity size was normal. Left ventricular diastolic  Doppler parameters are consistent with pseudonormalization Elevated left  ventricular end-diastolic pressure  The E/e' is 20. There is abnormal septal motion likely secondary to post  operative status.   2. The right ventricle has normal systolic function. The cavity was  normal. There is no increase in right ventricular wall thickness.   3. The mitral valve is normal in structure. Mild thickening of the mitral  valve leaflet. There is moderate mitral annular calcification present.   4. The tricuspid valve is normal in structure.   5. The aortic valve is normal in structure. Mild sclerosis of the aortic  valve.   6. The pulmonic valve was normal in structure. Pulmonic valve  regurgitation is mild by color flow Doppler.   7. Cannot exclude small PFO with left to right shunt.   FINDINGS   Left Ventricle: The left ventricle has normal systolic function, with an  ejection fraction of 60-65%. The cavity size was normal. There is no  increase in left ventricular wall thickness. Left ventricular diastolic  Doppler parameters are consistent with  pseudonormalization Elevated left ventricular end-diastolic pressure The  E/e' is 20. There is abnormal septal motion likely secondary to post  operative status.  Right  Ventricle: The right ventricle has normal systolic function. The  cavity was normal. There is no increase in right ventricular wall  thickness.  Left Atrium: left atrial size was normal in size  Right Atrium: right atrial size was normal in size. Right atrial pressure  is estimated at 3 mmHg.  Interatrial Septum: Cannot exclude small PFO with left to right shunt.  Pericardium: There is no evidence of pericardial effusion.  Mitral Valve: The mitral valve is normal in structure. Mild thickening of  the mitral valve leaflet. There is moderate mitral annular calcification  present. Mitral valve regurgitation is not visualized by color flow  Doppler.  Tricuspid Valve: The tricuspid valve is normal in structure. Tricuspid  valve regurgitation is trivial by color flow Doppler.  Aortic Valve: The aortic valve is normal in structure. Mild sclerosis of  the aortic valve. Aortic valve regurgitation was not visualized by color  flow Doppler. There is no evidence of aortic valve stenosis.  Pulmonic Valve: The pulmonic valve was normal in structure. Pulmonic valve  regurgitation is mild by color flow Doppler.  Venous: The inferior vena cava is normal in size with greater than 50%  respiratory variability.       ASSESSMENT:    1. Coronary artery disease involving coronary bypass graft of native heart without angina pectoris   2. Hyperlipidemia, unspecified hyperlipidemia type   3. Chronic diastolic CHF (congestive heart failure) ([facility])   4. Stage 2 chronic kidney disease      PLAN:  In order of problems listed above:  CAD status post redo CABG in 2012 with postop atrial fibrillation initially treated with amiodarone but this was stopped and maintaining normal sinus rhythm-no angina.  Follow-up with Dr. Angelena Form in 1 year.   Hyperlipidemia on Crestor.    LDL 46 04/08/2019.  Repeat later this year.   Chronic diastolic CHF  occasional swelling and is on daily Lasix.  Could be making gout worse  but patient says he swells up if  he does not use it. 2 g sodium diet.     CKD stage II last creatinine 1. 37-08/21/19        Medication Adjustments/Labs and Tests Ordered: Current medicines are reviewed at length with the patient today.  Concerns regarding medicines are outlined above.  Medication changes, Labs and Tests ordered today are listed in the Patient Instructions below. Patient Instructions  Medication Instructions:  Your physician recommends that you continue on your current medications as directed. Please refer to the Current Medication list given to you today.  *If you need a refill on your cardiac medications before your next appointment, please call your pharmacy*  Lab Work: None ordered  If you have labs (blood work) drawn today and your tests are completely normal, you will receive your results only by: Marland Kitchen MyChart Message (if you have MyChart) OR . A paper copy in the mail If you have any lab test that is abnormal or we need to change your treatment, we will call you to review the results.  Testing/Procedures: None ordered  Follow-Up: At [facility], you and your health needs are our priority.  As part of our continuing mission to provide you with exceptional heart care, we have created designated Provider Care Teams.  These Care Teams include your primary Cardiologist (physician) and Advanced Practice Providers (APPs -  Physician Assistants and Nurse Practitioners) who all work together to provide you with the care you need, when you need it.  Your next appointment:   12 month(s)  The format for your next appointment:   In Person  Provider:   You may see Lauree Chandler, MD or one of the following Advanced Practice Providers on your designated Care Team:    Melina Copa, PA-C  Ermalinda Barrios, PA-C   Other Instructions      Signed, Ermalinda Barrios, PA-C  09/23/2019 10:48 AM    [address], [place], [place]   [address] Phone: [phone]; Fax: [phone]

## 2019-09-23 ENCOUNTER — Ambulatory Visit: Payer: BC Managed Care – PPO | Admitting: Physician Assistant

## 2019-09-23 ENCOUNTER — Encounter: Payer: Self-pay | Admitting: Physician Assistant

## 2019-09-23 ENCOUNTER — Other Ambulatory Visit: Payer: Self-pay

## 2019-09-23 VITALS — BP 108/62 | HR 62 | Ht 67.5 in | Wt 186.0 lb

## 2019-09-23 DIAGNOSIS — I5032 Chronic diastolic (congestive) heart failure: Secondary | ICD-10-CM | POA: Diagnosis not present

## 2019-09-23 DIAGNOSIS — N182 Chronic kidney disease, stage 2 (mild): Secondary | ICD-10-CM

## 2019-09-23 DIAGNOSIS — E785 Hyperlipidemia, unspecified: Secondary | ICD-10-CM

## 2019-09-23 DIAGNOSIS — I25810 Atherosclerosis of coronary artery bypass graft(s) without angina pectoris: Secondary | ICD-10-CM

## 2019-09-23 MED ORDER — ROSUVASTATIN CALCIUM 40 MG PO TABS: 40.0000 mg | ORAL_TABLET | Freq: Every day | ORAL | 3 refills | Status: DC

## 2019-09-23 NOTE — Patient Instructions (Signed)
Medication Instructions:  Your physician recommends that you continue on your current medications as directed. Please refer to the Current Medication list given to you today.  *If you need a refill on your cardiac medications before your next appointment, please call your pharmacy*  Lab Work: None ordered  If you have labs (blood work) drawn today and your tests are completely normal, you will receive your results only by: Marland Kitchen MyChart Message (if you have MyChart) OR . A paper copy in the mail If you have any lab test that is abnormal or we need to change your treatment, we will call you to review the results.  Testing/Procedures: None ordered  Follow-Up: At [facility], you and your health needs are our priority.  As part of our continuing mission to provide you with exceptional heart care, we have created designated Provider Care Teams.  These Care Teams include your primary Cardiologist (physician) and Advanced Practice Providers (APPs -  Physician Assistants and Nurse Practitioners) who all work together to provide you with the care you need, when you need it.  Your next appointment:   12 month(s)  The format for your next appointment:   In Person  Provider:   You may see Lauree Chandler, MD or one of the following Advanced Practice Providers on your designated Care Team:    Melina Copa, PA-C  Ermalinda Barrios, PA-C   Other Instructions

## 2019-10-19 ENCOUNTER — Ambulatory Visit: Payer: BC Managed Care – PPO | Admitting: Family Medicine

## 2019-10-19 ENCOUNTER — Encounter: Payer: Self-pay | Admitting: Family Medicine

## 2019-10-19 ENCOUNTER — Other Ambulatory Visit: Payer: Self-pay

## 2019-10-19 VITALS — BP 122/75 | HR 63 | Temp 97.4°F | Ht 67.5 in | Wt 185.0 lb

## 2019-10-19 DIAGNOSIS — M79672 Pain in left foot: Secondary | ICD-10-CM | POA: Diagnosis not present

## 2019-10-19 DIAGNOSIS — M109 Gout, unspecified: Secondary | ICD-10-CM | POA: Diagnosis not present

## 2019-10-19 DIAGNOSIS — M79671 Pain in right foot: Secondary | ICD-10-CM

## 2019-10-19 DIAGNOSIS — M6788 Other specified disorders of synovium and tendon, other site: Secondary | ICD-10-CM

## 2019-10-19 NOTE — Progress Notes (Signed)
Subjective:  Patient ID: Jeremy Hendricks, male    DOB: Feb 02, 1956  Age: 64 y.o. MRN: [id number]  CC:  Chief Complaint  Patient presents with  . Follow-up    on GOUT. pt state  his gout has gone away, but feels some pain in both ankels and feet. 4/10 pain level.     HPI ADEDAMOLA GASPARYAN presents for   Gout Last flare: Recently recurrent flares in his feet.  Multiple rounds of prednisone has been used, most recently prescribed February 4.  Erosive arthropathy of the first MTP joint on the left foot noted on x-ray January 15.  Tiny punctate bony density adjacent to the distal tip of the right fifth proximal phalanx, thought to be possibly old chip fracture but no other abnormalities identified on January 15.  Evaluated by podiatry January 29 with bilateral lower extremity numbness, recommended follow-up with spine specialist.  Additionally has been referred to rheumatology, appointment April 1st.    Daily meds:Currently on allopurinol 100 mg daily. No new side effects.  Colcrys prescribed in the past as well as tramadol for acute pain.   Lab Results  Component Value Date   LABURIC 7.5 07/29/2019   Completed taper in February. Took 20mg  prednisone once mid February - for big toe pain - felt like recurrence -resolved next day. No recent tramadol or colcrys (did not seem to work whsn taken prior).  Currently: Pain posterior heel to lower legs and achilles area bilaterally. Past few months - may have noticed with more heel walking when gout flared in toe. Worse in middle if night.  Ambulating ok during day. More sore after rest.    Off glucosamine/chondroitin since the fall, feels like some of his aches and pains were better when using that medication.    History Patient Active Problem List   Diagnosis Date Noted  . Skin fissures 10/09/2018  . Skin infection 10/09/2018  . Chronic diastolic CHF (congestive heart failure) ([place]) 09/23/2018  . CKD (chronic kidney disease) stage 2, GFR 60-89  ml/min 09/23/2018  . History of ETT   . Gout   . DJD (degenerative joint disease)   . Coronary artery disease   . Atrial fibrillation ([place])   . 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 HLD (hyperlipidemia) 06/11/2016  . Hypogonadism in male 09/09/2015  . INSOMNIA 09/19/2010  . SHORTNESS OF BREATH 09/19/2010  . CHEST PAIN-PRECORDIAL 08/23/2010  . CAD, ARTERY BYPASS GRAFT 10/26/2009  . HYPERLIPIDEMIA 10/24/2009  . DEGENERATIVE JOINT DISEASE 10/24/2009  . ANGINA, HX OF 10/24/2009   Past Medical History:  Diagnosis Date  . Atrial fibrillation ([place])    postoperative  . Chronic diastolic CHF (congestive heart failure) ([place])   . CKD (chronic kidney disease), stage II   . Coronary artery disease    a. s/p CABG 2002. b. s/p redo 2012.  Marland Kitchen DJD (degenerative joint disease)   . Gout   . History of ETT    a. ETT 6/16:  normal   Past Surgical History:  Procedure Laterality Date  . CORONARY ARTERY BYPASS GRAFT  2002  . CORONARY ARTERY BYPASS GRAFT  08-2010   L-LAD remained from original CABG; new grafts incl L radial- PDA + RIMA-RI  . VASECTOMY     No Known Allergies Prior to Admission medications   Medication Sig Start Date End Date Taking? Authorizing Provider  allopurinol (ZYLOPRIM) 100 MG tablet TAKE 1 TABLET BY MOUTH EVERY DAY 09/12/19  Yes Wendie Agreste, MD  aspirin EC 81 MG  tablet Take 81 mg daily by mouth.   Yes [provider]  atenolol (TENORMIN) 25 MG tablet TAKE 1 TABLET BY MOUTH EVERY DAY 02/27/19  Yes Dunn, Dayna N, PA-C  Colchicine 0.6 MG CAPS Take 1 tablets on first day of flare up. Then take 1 daily until flare up resolved 08/18/19  Yes Maximiano Coss, NP  furosemide (LASIX) 40 MG tablet Take 1 tablet by mouth daily. Also take 1/2 tablet by mouth as needed for swelling. Please keep upcoming appt in February for future refills. Thank you 09/14/19  Yes Dunn, Dayna N, PA-C  Multiple Vitamin (MULTIVITAMIN) tablet Take 1 tablet by mouth daily.     Yes [provider]    nitroGLYCERIN (NITROSTAT) 0.4 MG SL tablet Place 0.4 mg under the tongue every 5 (five) minutes as needed for chest pain. Reported on 09/21/2015   Yes [provider]  predniSONE (DELTASONE) 20 MG tablet 3 by mouth for 3 days, then 2 by mouth for 2 days, then 1 by mouth for 2 days, then 1/2 by mouth for 2 days. 09/17/19  Yes Wendie Agreste, MD  rosuvastatin (CRESTOR) 40 MG tablet Take 1 tablet (40 mg total) by mouth daily. 09/23/19  Yes Imogene Burn, PA-C  Testosterone 30 MG/ACT SOLN APPLY 1 PUMP EVERY DAY 09/09/19  Yes Wendie Agreste, MD  traMADol (ULTRAM) 50 MG tablet Take 1 tablet (50 mg total) by mouth every 6 (six) hours as needed. 08/28/19  Yes Wendie Agreste, MD  emtricitabine-tenofovir (TRUVADA) 200-300 MG tablet TAKE ONE TABLET BY MOUTH ONCE DAILY WITH OR WITHOUT FOOD. STORE IN ORIGINAL CONTAINER AT ROOM TEMPERATURE. Patient not taking: Reported on 10/19/2019 03/12/19   Wendie Agreste, MD  fluticasone [facility]) 50 MCG/ACT nasal spray Place 1 spray into both nostrils daily. Patient not taking: Reported on 10/19/2019 03/12/19   Wendie Agreste, MD   Social History   Socioeconomic History  . Marital status: Single    Spouse name: Not on file  . Number of children: 2  . Years of education: Not on file  . Highest education level: Not on file  Occupational History  . Occupation: Realtor  Tobacco Use  . Smoking status: Former Smoker    Quit date: 08/14/1999    Years since quitting: 20.1  . Smokeless tobacco: Never Used  Substance and Sexual Activity  . Alcohol use: Yes    Alcohol/week: 14.0 standard drinks    Types: 14 Standard drinks or equivalent per week  . Drug use: No  . Sexual activity: Yes  Other Topics Concern  . Not on file  Social History Narrative   Single   Education: [employer]   Exercise: Yes   Social Determinants of Health   Financial Resource Strain:   . Difficulty of Paying Living Expenses: Not on file  Food Insecurity:   . Worried About  Charity fundraiser in the Last Year: Not on file  . Ran Out of Food in the Last Year: Not on file  Transportation Needs:   . Lack of Transportation (Medical): Not on file  . Lack of Transportation (Non-Medical): Not on file  Physical Activity:   . Days of Exercise per Week: Not on file  . Minutes of Exercise per Session: Not on file  Stress:   . Feeling of Stress : Not on file  Social Connections:   . Frequency of Communication with Friends and Family: Not on file  . Frequency of Social Gatherings with Friends  and Family: Not on file  . Attends Religious Services: Not on file  . Active Member of Clubs or Organizations: Not on file  . Attends Archivist Meetings: Not on file  . Marital Status: Not on file  Intimate Partner Violence:   . Fear of Current or Ex-Partner: Not on file  . Emotionally Abused: Not on file  . Physically Abused: Not on file  . Sexually Abused: Not on file    Review of Systems Per HPI.   Objective:   Vitals:   10/19/19 0847  BP: 122/75  Pulse: 63  Temp: (!) 97.4 F (36.3 C)  TempSrc: Temporal  SpO2: 93%  Weight: 185 lb (83.9 kg)  Height: 5' 7.5" (1.715 m)     Physical Exam Vitals reviewed.  Constitutional:      General: He is not in acute distress.    Appearance: He is well-developed.  HENT:     Head: Normocephalic and atraumatic.  Cardiovascular:     Rate and Rhythm: Normal rate.  Pulmonary:     Effort: Pulmonary effort is normal.  Musculoskeletal:        General: No swelling or deformity.     Right lower leg: No edema.     Left lower leg: No edema.     Comments: No focal bony tenderness, no apparent swelling.  Achilles, retrocalcaneal bursa without focal tenderness or defect, calves nontender.  Does describe area of pain at posterior ankle up Achilles towards calf.  Negative Homans, negative Thompson's.  Neurological:     Mental Status: He is alert and oriented to person, place, and time.        Assessment & Plan:   LUKUS NEUHOFF is a 64 y.o. male . Gout, unspecified cause, unspecified chronicity, unspecified site - Plan: Uric Acid  -Check uric acid, if uric acid is still above 6, would recommend trial of 200 mg allopurinol to lessen frequency of flares.  Prednisone has worked well previously for flares, option of short course if needed.  Hold on daily prednisone for now.  Rheumatology follow-up planned.  Bilateral foot pain  -Intermittent pains possibly gout possibly component of degenerative changes or soft tissue.  Limited with NSAIDs given heart history.  Okay to restart glucosamine/chondroitin, consider orthopedic follow-up if persistent pain.  Achilles tendonosis  -Likely Achilles tendinosis/tight heel cords with symptoms overnight.  Calf stretches, Achilles stretches prior to bedtime, and during night if needed with RTC precautions.  Option of physical therapy/Ortho eval.  No orders of the defined types were placed in this encounter.  Patient Instructions       If you have lab work done today you will be contacted with your lab results within the next 2 weeks.  If you have not heard from [place] then please contact us. The fastest way to get your results is to register for My Chart.   IF you received an x-ray today, you will receive an invoice from [facility]. Please contact [facility] at [phone] with questions or concerns regarding your invoice.   IF you received labwork today, you will receive an invoice from [place]. Please contact [facility] at [phone] with questions or concerns regarding your invoice.   Our billing staff will not be able to assist you with questions regarding bills from these companies.  You will be contacted with the lab results as soon as they are available. The fastest way to get your results is to activate your My Chart account. Instructions are located on  the last page of this paperwork. If you have not heard from [place] regarding the results in  2 weeks, please contact this office.         Signed, [name], MD [facility]

## 2019-10-19 NOTE — Patient Instructions (Addendum)
  Based on the location of your symptoms, I think that you likely have some tight heel cords or Achilles tendon soreness at night.  Try the stretches that we discussed over the next week to 10 days, and if that is not improving, let me know and I will refer you to either physical therapy or orthopedic specialist.  I think it is reasonable to restart glucosamine/chondroitin sulfate if that was helpful in the past.  If uric acid is still elevated, would consider higher dose of allopurinol.  I will let you know.  Keep follow-up with rheumatology as planned.   If you have lab work done today you will be contacted with your lab results within the next 2 weeks.  If you have not heard from [place] then please contact us. The fastest way to get your results is to register for My Chart.   IF you received an x-ray today, you will receive an invoice from [facility]. Please contact [facility] at [phone] with questions or concerns regarding your invoice.   IF you received labwork today, you will receive an invoice from [place]. Please contact [facility] at [phone] with questions or concerns regarding your invoice.   Our billing staff will not be able to assist you with questions regarding bills from these companies.  You will be contacted with the lab results as soon as they are available. The fastest way to get your results is to activate your My Chart account. Instructions are located on the last page of this paperwork. If you have not heard from [place] regarding the results in 2 weeks, please contact this office.

## 2019-10-20 LAB — URIC ACID: Uric Acid: 8.6 mg/dL — ABNORMAL HIGH (ref 3.8–8.4)

## 2019-10-24 ENCOUNTER — Ambulatory Visit: Payer: BC Managed Care – PPO | Attending: Internal Medicine

## 2019-10-24 DIAGNOSIS — Z23 Encounter for immunization: Secondary | ICD-10-CM

## 2019-10-24 NOTE — Progress Notes (Signed)
   Covid-19 Vaccination Clinic  Name:  Jeremy Hendricks    MRN: [id number] DOB: 1955-09-28  10/24/2019  [name] was observed post Covid-19 immunization for 15 minutes without incident. He was provided with Vaccine Information Sheet and instruction to access the V-Safe system.   [name] was instructed to call 911 with any severe reactions post vaccine: Marland Kitchen Difficulty breathing  . Swelling of face and throat  . A fast heartbeat  . A bad rash all over body  . Dizziness and weakness   Immunizations Administered    Name Date Dose VIS Date Route   Pfizer COVID-19 Vaccine 10/24/2019  3:12 PM 0.3 mL 07/24/2019 Intramuscular   Manufacturer: [place]   Lot: [id number]   [place]: [id number]

## 2019-10-28 ENCOUNTER — Encounter: Payer: Self-pay | Admitting: Family Medicine

## 2019-10-28 NOTE — Telephone Encounter (Signed)
Would you like this pt to double his allopurinol for his elevated uric acid or wait for ENDO?

## 2019-11-09 NOTE — Progress Notes (Signed)
Office Visit Note  Patient: Jeremy Hendricks             Date of Birth: 14-Sep-1955           MRN: [id number]             PCP: Wendie Agreste, MD Referring: Wendie Agreste, MD Visit Date: 11/12/2019 Occupation: @GUAROCC @  Subjective:  Gout management.   History of Present Illness: Jeremy Hendricks is a 64 y.o. male seen in consultation per request of his PCP.  According to patient he has had history of gout for last 4 to 5 years.  He states initially the gout used to attack his first MTP is that it moved to his knees.  Recently has been having some swelling in his left third finger.  He states the episode became more frequent now.  He states he has been having episodes about 3-4 times a year.  During most of the attacks he takes prednisone taper.  He has taken colchicine in the past for the attacks which is not as effective.  He states about 2 months ago he was placed on allopurinol 100 mg p.o. daily.  His uric acid did not respond much to that and the dose was increased to 200 mg p.o. daily for the last 3 weeks.  His last episode was 1-1/2 months ago.  Which was in his left great toe.  He states he has been working out every day.  His back is stiff.  He states several years ago he was diagnosed with degenerative disc disease of lumbar spine and was advised surgery and injections.  He drinks alcohol about 2 ounces daily which includes wine and rum.  He is also cutting back on red meat.  Activities of Daily Living:  Patient reports morning stiffness for 4-5 hours.   Patient Denies nocturnal pain.  Difficulty dressing/grooming: Reports Difficulty climbing stairs: Reports Difficulty getting out of chair: Reports Difficulty using hands for taps, buttons, cutlery, and/or writing: Denies  Review of Systems  Constitutional: Negative for fatigue.  HENT: Negative for mouth sores, mouth dryness and nose dryness.   Eyes: Negative for itching and dryness.  Respiratory: Negative for shortness of  breath and difficulty breathing.   Cardiovascular: Negative for chest pain and palpitations.  Gastrointestinal: Negative for blood in stool, constipation and diarrhea.  Endocrine: Negative for heat intolerance.  Genitourinary: Negative for difficulty urinating.  Musculoskeletal: Positive for arthralgias, joint pain, joint swelling and morning stiffness. Negative for myalgias, muscle tenderness and myalgias.  Skin: Negative for color change, rash and redness.  Allergic/Immunologic: Negative for susceptible to infections.  Neurological: Negative for dizziness, numbness, headaches, memory loss and weakness.  Hematological: Negative for bruising/bleeding tendency.  Psychiatric/Behavioral: Negative for depressed mood and confusion. The patient is not nervous/anxious.     PMFS History:  Patient Active Problem List   Diagnosis Date Noted  . Skin fissures 10/09/2018  . Skin infection 10/09/2018  . Chronic diastolic CHF (congestive heart failure) ([place]) 09/23/2018  . CKD (chronic kidney disease) stage 2, GFR 60-89 ml/min 09/23/2018  . History of ETT   . Gout   . DJD (degenerative joint disease)   . Coronary artery disease   . Atrial fibrillation ([place])   . 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 HLD (hyperlipidemia) 06/11/2016  . Hypogonadism in male 09/09/2015  . INSOMNIA 09/19/2010  . SHORTNESS OF BREATH 09/19/2010  . CHEST PAIN-PRECORDIAL 08/23/2010  . CAD, ARTERY BYPASS GRAFT 10/26/2009  . HYPERLIPIDEMIA 10/24/2009  .  DEGENERATIVE JOINT DISEASE 10/24/2009  . ANGINA, HX OF 10/24/2009    Past Medical History:  Diagnosis Date  . Atrial fibrillation ([place])    postoperative  . Chronic diastolic CHF (congestive heart failure) ([place])   . CKD (chronic kidney disease), stage II   . Coronary artery disease    a. s/p CABG 2002. b. s/p redo 2012.  Marland Kitchen DJD (degenerative joint disease)   . Gout   . History of ETT    a. ETT 6/16:  normal    Family History  Problem Relation Age of Onset  . Heart attack  Father 33  . Hypertension Father   . Cancer Mother        Breast cancer  . Cancer Other        family hx of  . Coronary artery disease Other        family hx of  . Hyperlipidemia Other        family hx of  . Hypertension Paternal Grandfather   . Healthy Son   . Healthy Daughter   . Stroke Neg Hx   . Colon cancer Neg Hx    Past Surgical History:  Procedure Laterality Date  . CORONARY ARTERY BYPASS GRAFT  2002  . CORONARY ARTERY BYPASS GRAFT  08-2010   L-LAD remained from original CABG; new grafts incl L radial- PDA + RIMA-RI  . VASECTOMY     Social History   Social History Narrative   Single   Education: College   Exercise: Yes   Immunization History  Administered Date(s) Administered  . Hepatitis B 09/20/2000  . Influenza Split 05/14/2015, 06/11/2016  . Influenza,inj,Quad PF,6+ Mos 04/04/2017, 04/15/2018  . Influenza-Unspecified 04/04/2017  . PFIZER SARS-COV-2 Vaccination 10/24/2019  . Tdap 02/11/2017  . Zoster 08/13/2013     Objective: Vital Signs: BP 116/72 (BP Location: Right Arm, Patient Position: Sitting, Cuff Size: Normal)   Pulse (!) 59   Resp 15   Ht 5\' 7"  (1.702 m)   Wt 189 lb (85.7 kg)   BMI 29.60 kg/m    Physical Exam Vitals and nursing note reviewed.  Constitutional:      Appearance: He is well-developed.  HENT:     Head: Normocephalic and atraumatic.  Eyes:     Conjunctiva/sclera: Conjunctivae normal.     Pupils: Pupils are equal, round, and reactive to light.  Cardiovascular:     Rate and Rhythm: Normal rate and regular rhythm.     Heart sounds: Normal heart sounds.  Pulmonary:     Effort: Pulmonary effort is normal.     Breath sounds: Normal breath sounds.  Abdominal:     General: Bowel sounds are normal.     Palpations: Abdomen is soft.  Musculoskeletal:     Cervical back: Normal range of motion and neck supple.  Skin:    General: Skin is warm and dry.     Capillary Refill: Capillary refill takes less than 2 seconds.    Neurological:     Mental Status: He is alert and oriented to person, place, and time.  Psychiatric:        Behavior: Behavior normal.      Musculoskeletal Exam: C-spine was in good range of motion.  Thoracic spine was in good range of motion.  He has very limited range of motion of his lumbar spine.  There was no tenderness over lumbar spine or SI joints.  Shoulder joints and elbow joints with good range of motion.  Great has good range  of motion of his wrist joints MCPs and PIPs.  He has bilateral DIP thickening especially of his bilateral middle finger.  Hip joints and knee joints in good range of motion.  He has some discomfort on palpation of his ankle joint but no swelling was noted.  Thickening of his left first MTP was noted.  No tenderness was noted.  CDAI Exam: CDAI Score: -- Patient Global: --; Provider Global: -- Swollen: --; Tender: -- Joint Exam 11/12/2019   No joint exam has been documented for this visit   There is currently no information documented on the homunculus. Go to the Rheumatology activity and complete the homunculus joint exam.  Investigation: No additional findings.  Imaging: XR Foot 2 Views Left  Result Date: 11/12/2019 First MTP narrowing and erosive changes were noted.  None of the other MTP showed erosive changes or narrowing.  Mild PIP and DIP narrowing was noted.  No intertarsal, tibiotalar or subtalar joint space narrowing was noted. Impression: These findings are consistent with osteoarthritis and gouty arthropathy.  XR Foot 2 Views Right  Result Date: 11/12/2019 First MTP, PIP and DIP narrowing was noted.  No intertarsal, tibiotalar or subtalar joint space narrowing was noted. Impression: These findings are consistent with osteoarthritis of the foot.  XR Hand 2 View Left  Result Date: 11/12/2019 PIP and DIP narrowing was noted.  Some erosive changes were noted in the second DIP joint.  No MCP, intercarpal radiocarpal joint space narrowing was  noted. Impression: These findings are consistent with osteoarthritis and possible gouty arthropathy.  XR Hand 2 View Right  Result Date: 11/12/2019 CMC, PIP and DIP narrowing was noted.  No MCP, intercarpal radiocarpal joint space narrowing was noted.  No erosive changes were noted. Impression: These findings are consistent with osteoarthritis of the hand.   Recent Labs: Lab Results  Component Value Date   WBC 8.3 03/12/2019   HGB 16.3 03/12/2019   PLT 218 03/12/2019   NA 137 08/21/2019   K 4.1 08/21/2019   CL 97 08/21/2019   CO2 25 08/21/2019   GLUCOSE 104 (H) 08/21/2019   BUN 17 08/21/2019   CREATININE 1.37 (H) 08/21/2019   BILITOT 0.5 03/12/2019   ALKPHOS 64 03/12/2019   AST 21 03/12/2019   ALT 22 03/12/2019   PROT 7.2 03/12/2019   ALBUMIN 5.0 (H) 03/12/2019   CALCIUM 9.7 08/21/2019   GFRAA 63 08/21/2019    Speciality Comments: No specialty comments available.  Procedures:  No procedures performed Allergies: Patient has no known allergies.   Assessment / Plan:     Visit Diagnoses: Idiopathic chronic gout of multiple sites without tophus - uric acid 8.6 on 10/19/19. Allopurinol 100 mg daily and colchicine 0.6 mg tablet prn during flares.  Patient's uric acid is elevated.  He has been on allopurinol 200 mg for 3 weeks now.  He should be increasing it to 300 mg p.o. daily.  We had detailed discussion regarding gout and management of gout.  Ideally his uric acid should be less than 6.0.  To prevent flares he should be on colchicine 0.6 mg p.o. daily.  Pain in both hands -DIP and PIP thickening was noted.  Plan: XR Hand 2 View Right, XR Hand 2 View Left  Pain in both feet -he has discomfort in his bilateral feet off and on.  He does have some degenerative changes on examination.  Plan: XR Foot 2 Views Right, XR Foot 2 Views Left  DDD (degenerative disc disease), lumbar-patient has very  limited range of motion of his lumbar spine.  He states he has been evaluated by surgeons in  the past and was advised surgery.  CKD (chronic kidney disease) stage 2, GFR 60-89 ml/min  Chronic diastolic CHF (congestive heart failure) (H[facility])  Atherosclerosis of coronary artery bypass graft of native heart without angina pectoris  History of atrial fibrillation  History of hyperlipidemia  Other insomnia  Orders: Orders Placed This Encounter  Procedures  . XR Foot 2 Views Right  . XR Foot 2 Views Left  . XR Hand 2 View Right  . XR Hand 2 View Left  . CBC with Differential/Platelet  . COMPLETE METABOLIC PANEL WITH GFR  . Uric acid   Meds ordered this encounter  Medications  . colchicine 0.6 MG tablet    Sig: Take 1 tablet (0.6 mg total) by mouth daily.    Dispense:  90 tablet    Refill:  0    Face-to-face time spent with patient was 50 minutes. Greater than 50% of time was spent in counseling and coordination of care.  Follow-Up Instructions: Return for Gout.   Bo Merino, MD  Note - This record has been created using Editor, commissioning.  Chart creation errors have been sought, but may not always  have been located. Such creation errors do not reflect on  the standard of medical care.

## 2019-11-12 ENCOUNTER — Ambulatory Visit: Payer: BC Managed Care – PPO | Admitting: Rheumatology

## 2019-11-12 ENCOUNTER — Ambulatory Visit: Payer: Self-pay

## 2019-11-12 ENCOUNTER — Other Ambulatory Visit: Payer: Self-pay

## 2019-11-12 ENCOUNTER — Encounter: Payer: Self-pay | Admitting: Rheumatology

## 2019-11-12 VITALS — BP 116/72 | HR 59 | Resp 15 | Ht 67.0 in | Wt 189.0 lb

## 2019-11-12 DIAGNOSIS — M79641 Pain in right hand: Secondary | ICD-10-CM

## 2019-11-12 DIAGNOSIS — M1A09X0 Idiopathic chronic gout, multiple sites, without tophus (tophi): Secondary | ICD-10-CM

## 2019-11-12 DIAGNOSIS — M5136 Other intervertebral disc degeneration, lumbar region: Secondary | ICD-10-CM

## 2019-11-12 DIAGNOSIS — M79672 Pain in left foot: Secondary | ICD-10-CM

## 2019-11-12 DIAGNOSIS — M79671 Pain in right foot: Secondary | ICD-10-CM

## 2019-11-12 DIAGNOSIS — N182 Chronic kidney disease, stage 2 (mild): Secondary | ICD-10-CM

## 2019-11-12 DIAGNOSIS — Z8679 Personal history of other diseases of the circulatory system: Secondary | ICD-10-CM

## 2019-11-12 DIAGNOSIS — I5032 Chronic diastolic (congestive) heart failure: Secondary | ICD-10-CM

## 2019-11-12 DIAGNOSIS — M79642 Pain in left hand: Secondary | ICD-10-CM

## 2019-11-12 DIAGNOSIS — Z8639 Personal history of other endocrine, nutritional and metabolic disease: Secondary | ICD-10-CM

## 2019-11-12 DIAGNOSIS — I25810 Atherosclerosis of coronary artery bypass graft(s) without angina pectoris: Secondary | ICD-10-CM

## 2019-11-12 DIAGNOSIS — Z5181 Encounter for therapeutic drug level monitoring: Secondary | ICD-10-CM

## 2019-11-12 DIAGNOSIS — G4709 Other insomnia: Secondary | ICD-10-CM

## 2019-11-12 LAB — CBC WITH DIFFERENTIAL/PLATELET
Absolute Monocytes: 1720 cells/uL — ABNORMAL HIGH (ref 200–950)
Basophils Absolute: 55 cells/uL (ref 0–200)
Basophils Relative: 0.6 %
Eosinophils Absolute: 218 cells/uL (ref 15–500)
Eosinophils Relative: 2.4 %
HCT: 47.6 % (ref 38.5–50.0)
Hemoglobin: 15.6 g/dL (ref 13.2–17.1)
Lymphs Abs: 2448 cells/uL (ref 850–3900)
MCH: 30.8 pg (ref 27.0–33.0)
MCHC: 32.8 g/dL (ref 32.0–36.0)
MCV: 94.1 fL (ref 80.0–100.0)
MPV: 11.1 fL (ref 7.5–12.5)
Monocytes Relative: 18.9 %
Neutro Abs: 4659 cells/uL (ref 1500–7800)
Neutrophils Relative %: 51.2 %
Platelets: 221 10*3/uL (ref 140–400)
RBC: 5.06 10*6/uL (ref 4.20–5.80)
RDW: 12.4 % (ref 11.0–15.0)
Total Lymphocyte: 26.9 %
WBC: 9.1 10*3/uL (ref 3.8–10.8)

## 2019-11-12 LAB — COMPLETE METABOLIC PANEL WITH GFR
AG Ratio: 2.2 (calc) (ref 1.0–2.5)
ALT: 15 U/L (ref 9–46)
AST: 19 U/L (ref 10–35)
Albumin: 4.8 g/dL (ref 3.6–5.1)
Alkaline phosphatase (APISO): 50 U/L (ref 35–144)
BUN/Creatinine Ratio: 18 (calc) (ref 6–22)
BUN: 25 mg/dL (ref 7–25)
CO2: 30 mmol/L (ref 20–32)
Calcium: 9.6 mg/dL (ref 8.6–10.3)
Chloride: 103 mmol/L (ref 98–110)
Creat: 1.37 mg/dL — ABNORMAL HIGH (ref 0.70–1.25)
GFR, Est African American: 64 mL/min/{1.73_m2} (ref >=60)
GFR, Est Non African American: 55 mL/min/{1.73_m2} — ABNORMAL LOW (ref >=60)
Globulin: 2.2 g/dL (calc) (ref 1.9–3.7)
Glucose, Bld: 98 mg/dL (ref 65–99)
Potassium: 4.6 mmol/L (ref 3.5–5.3)
Sodium: 141 mmol/L (ref 135–146)
Total Bilirubin: 0.5 mg/dL (ref 0.2–1.2)
Total Protein: 7 g/dL (ref 6.1–8.1)

## 2019-11-12 LAB — URIC ACID: Uric Acid, Serum: 7.5 mg/dL (ref 4.0–8.0)

## 2019-11-12 MED ORDER — COLCHICINE 0.6 MG PO TABS: 0.6000 mg | ORAL_TABLET | Freq: Every day | ORAL | 0 refills | Status: DC

## 2019-11-12 NOTE — Progress Notes (Signed)
Pharmacy Note   Subjective:  Patient presents today to the [facility] for follow up office visit.  Patient was prescribed allopurinol for gout.  Patient was seen by the pharmacist for counseling on allopurinol and colchicine.   Objective: CBC    Component Value Date/Time   WBC 8.3 03/12/2019 0953   WBC 11.9 (A) 03/04/2017 0932   WBC 11.3 (H) 09/04/2010 0355   RBC 5.19 03/12/2019 0953   RBC 5.06 03/04/2017 0932   RBC 2.91 (L) 09/04/2010 0355   HGB 16.3 03/12/2019 0953   HCT 47.3 03/12/2019 0953   PLT 218 03/12/2019 0953   MCV 91 03/12/2019 0953   MCH 31.4 03/12/2019 0953   MCH 30.2 03/04/2017 0932   MCH 30.6 09/04/2010 0355   MCHC 34.5 03/12/2019 0953   MCHC 34.5 03/04/2017 0932   MCHC 34.6 09/04/2010 0355   RDW 12.8 03/12/2019 0953   LYMPHSABS 1.3 10/10/2017 1136   MONOABS 0.6 08/23/2010 1740   EOSABS 0.2 10/10/2017 1136   BASOSABS 0.0 10/10/2017 1136    CMP     Component Value Date/Time   NA 137 08/21/2019 0937   K 4.1 08/21/2019 0937   CL 97 08/21/2019 0937   CO2 25 08/21/2019 0937   GLUCOSE 104 (H) 08/21/2019 0937   GLUCOSE 98 03/29/2016 0857   BUN 17 08/21/2019 0937   CREATININE 1.37 (H) 08/21/2019 0937   CREATININE 1.07 03/29/2016 0857   CALCIUM 9.7 08/21/2019 0937   PROT 7.2 03/12/2019 0953   ALBUMIN 5.0 (H) 03/12/2019 0953   AST 21 03/12/2019 0953   ALT 22 03/12/2019 0953   ALKPHOS 64 03/12/2019 0953   BILITOT 0.5 03/12/2019 0953   GFRNONAA 55 (L) 08/21/2019 0937   GFRNONAA 65 07/20/2015 0921   GFRAA 63 08/21/2019 0937   GFRAA 75 07/20/2015 0921    Uric Acid Lab Results  Component Value Date   LABURIC 8.6 (H) 10/19/2019      Assessment/Plan:  Counseled patient on the purpose proper use, and adverse effects of allopurinol and colchicine.  Discussed the importance of taking allopurinol every day to lower uric acid levels.  The possibility of recurrent gout while lowering the uric acid was explained and discussed the importance of  taking colchicine daily at this time.  Provided patient with medication education material and answered all questions.    Drug interaction with statin and colchicine.  Increased risk of rhabdomyolysis when taken concomitantly.  Counseled patient to monitor for signs/ symptoms of rhabdomyolysis including dark-colored urine and/or muscle pain, tenderness, or weakness.   Mariella Saa, PharmD, [place], [place] Clinical Specialty Pharmacist [phone]  11/12/2019 9:51 AM

## 2019-11-16 ENCOUNTER — Telehealth: Payer: Self-pay | Admitting: *Deleted

## 2019-11-16 MED ORDER — ALLOPURINOL 300 MG PO TABS: 300.0000 mg | ORAL_TABLET | Freq: Every day | ORAL | 2 refills | Status: DC

## 2019-11-16 NOTE — Telephone Encounter (Signed)
-----   Message from Shona Needles, RT sent at 11/12/2019 12:25 PM EDT ----- Regarding: IF LABS NORMAL If labs are normal, increase allopurinol to 300 mg daily. Please see Dr. Arlean Hopping note. Thank you.

## 2019-11-16 NOTE — Progress Notes (Signed)
CBC and CMP are stable.  Creatinine is elevated but not change.  Uric acid is mildly elevated.  We will continue current treatment.

## 2019-11-16 NOTE — Telephone Encounter (Signed)
Dr. Estanislado Pandy okay to send in the increased dose of Allopurinol 300 mg.

## 2019-11-17 ENCOUNTER — Ambulatory Visit: Payer: BC Managed Care – PPO

## 2019-11-17 ENCOUNTER — Ambulatory Visit: Payer: BC Managed Care – PPO | Attending: Internal Medicine

## 2019-11-17 DIAGNOSIS — Z23 Encounter for immunization: Secondary | ICD-10-CM

## 2019-11-17 NOTE — Progress Notes (Signed)
   Covid-19 Vaccination Clinic  Name:  Jeremy Hendricks    MRN: [id number] DOB: 08/13/1956  11/17/2019  [name] was observed post Covid-19 immunization for 15 minutes without incident. He was provided with Vaccine Information Sheet and instruction to access the V-Safe system.   [name] was instructed to call 911 with any severe reactions post vaccine: Marland Kitchen Difficulty breathing  . Swelling of face and throat  . A fast heartbeat  . A bad rash all over body  . Dizziness and weakness   Immunizations Administered    Name Date Dose VIS Date Route   Pfizer COVID-19 Vaccine 11/17/2019 12:39 PM 0.3 mL 07/24/2019 Intramuscular   Manufacturer: [place]   Lot: [id number]   [place]: [id number]

## 2019-12-06 ENCOUNTER — Other Ambulatory Visit: Payer: Self-pay | Admitting: Physician Assistant

## 2019-12-06 NOTE — Progress Notes (Signed)
Office Visit Note  Patient: Jeremy Hendricks             Date of Birth: 21-Mar-1956           MRN: [id number]             PCP: Wendie Agreste, MD Referring: Wendie Agreste, MD Visit Date: 12/09/2019 Occupation: @GUAROCC @  Subjective:  Medication monitoring   History of Present Illness: Jeremy Hendricks is a 64 y.o. male with history of gout and osteoarthritis.  He states he has been having some stiffness in his cervical spine and his hands.  He has not had any gout flare.  He has been taking only allopurinol 300 mg p.o. daily for the last 1 month.  He does not take any colchicine.  He has not noticed any side effects from allopurinol.  He has been watching his diet.  He also goes to the gym on a regular basis.  Activities of Daily Living:  Patient reports morning stiffness for 15 minutes.   Patient Denies nocturnal pain.  Difficulty dressing/grooming: Denies Difficulty climbing stairs: Reports Difficulty getting out of chair: Denies Difficulty using hands for taps, buttons, cutlery, and/or writing: Reports  Review of Systems  Constitutional: Negative for fatigue and night sweats.  HENT: Negative for mouth sores, mouth dryness and nose dryness.   Eyes: Negative for redness and dryness.  Respiratory: Negative for shortness of breath and difficulty breathing.   Cardiovascular: Negative for chest pain, palpitations, hypertension, irregular heartbeat and swelling in legs/feet.  Gastrointestinal: Negative for constipation and diarrhea.  Endocrine: Negative for excessive thirst and increased urination.  Genitourinary: Negative for difficulty urinating.  Musculoskeletal: Positive for arthralgias, joint pain and morning stiffness. Negative for joint swelling, myalgias, muscle weakness, muscle tenderness and myalgias.  Skin: Negative for color change, rash, hair loss, nodules/bumps, skin tightness, ulcers and sensitivity to sunlight.  Allergic/Immunologic: Negative for susceptible to  infections.  Neurological: Negative for dizziness, fainting, numbness, memory loss, night sweats and weakness ( ).  Hematological: Negative for bruising/bleeding tendency and swollen glands.  Psychiatric/Behavioral: Negative for depressed mood and sleep disturbance. The patient is not nervous/anxious.     PMFS History:  Patient Active Problem List   Diagnosis Date Noted  . Skin fissures 10/09/2018  . Skin infection 10/09/2018  . Chronic diastolic CHF (congestive heart failure) ([place]) 09/23/2018  . CKD (chronic kidney disease) stage 2, GFR 60-89 ml/min 09/23/2018  . History of ETT   . Gout   . DJD (degenerative joint disease)   . Coronary artery disease   . Atrial fibrillation ([place])   . 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 HLD (hyperlipidemia) 06/11/2016  . Hypogonadism in male 09/09/2015  . INSOMNIA 09/19/2010  . SHORTNESS OF BREATH 09/19/2010  . CHEST PAIN-PRECORDIAL 08/23/2010  . CAD, ARTERY BYPASS GRAFT 10/26/2009  . HYPERLIPIDEMIA 10/24/2009  . DEGENERATIVE JOINT DISEASE 10/24/2009  . ANGINA, HX OF 10/24/2009    Past Medical History:  Diagnosis Date  . Atrial fibrillation ([place])    postoperative  . Chronic diastolic CHF (congestive heart failure) ([place])   . CKD (chronic kidney disease), stage II   . Coronary artery disease    a. s/p CABG 2002. b. s/p redo 2012.  Marland Kitchen DJD (degenerative joint disease)   . Gout   . History of ETT    a. ETT 6/16:  normal    Family History  Problem Relation Age of Onset  . Heart attack Father 65  . Hypertension Father   .  Cancer Mother        Breast cancer  . Cancer Other        family hx of  . Coronary artery disease Other        family hx of  . Hyperlipidemia Other        family hx of  . Hypertension Paternal Grandfather   . Healthy Son   . Healthy Daughter   . Stroke Neg Hx   . Colon cancer Neg Hx    Past Surgical History:  Procedure Laterality Date  . CORONARY ARTERY BYPASS GRAFT  2002  . CORONARY ARTERY BYPASS GRAFT  08-2010   L-LAD  remained from original CABG; new grafts incl L radial- PDA + RIMA-RI  . VASECTOMY     Social History   Social History Narrative   Single   Education: College   Exercise: Yes   Immunization History  Administered Date(s) Administered  . Hepatitis B 09/20/2000  . Influenza Split 05/14/2015, 06/11/2016  . Influenza,inj,Quad PF,6+ Mos 04/04/2017, 04/15/2018  . Influenza-Unspecified 04/04/2017  . PFIZER SARS-COV-2 Vaccination 10/24/2019, 11/17/2019  . Tdap 02/11/2017  . Zoster 08/13/2013     Objective: Vital Signs: BP 114/67 (BP Location: Left Arm, Patient Position: Sitting, Cuff Size: Large)   Pulse 61   Resp 16   Ht 5\' 7"  (1.702 m)   Wt 190 lb 9.6 oz (86.5 kg)   BMI 29.85 kg/m    Physical Exam Vitals and nursing note reviewed.  Constitutional:      Appearance: He is well-developed.  HENT:     Head: Normocephalic and atraumatic.  Eyes:     Conjunctiva/sclera: Conjunctivae normal.     Pupils: Pupils are equal, round, and reactive to light.  Cardiovascular:     Rate and Rhythm: Normal rate and regular rhythm.     Heart sounds: Normal heart sounds.  Pulmonary:     Effort: Pulmonary effort is normal.     Breath sounds: Normal breath sounds.  Abdominal:     General: Bowel sounds are normal.     Palpations: Abdomen is soft.  Musculoskeletal:     Cervical back: Normal range of motion and neck supple.  Skin:    General: Skin is warm and dry.     Capillary Refill: Capillary refill takes less than 2 seconds.  Neurological:     Mental Status: He is alert and oriented to person, place, and time.  Psychiatric:        Behavior: Behavior normal.      Musculoskeletal Exam: He has some limitation range of motion of his cervical spine.  Shoulder joints, elbow joints, wrist joints with good range of motion.  He has bilateral PIP and DIP thickening with no synovitis.  Hip joints, knee joints with good range of motion.  He had no tenderness across MTPs.  CDAI Exam: CDAI Score:  -- Patient Global: --; Provider Global: -- Swollen: --; Tender: -- Joint Exam 12/09/2019   No joint exam has been documented for this visit   There is currently no information documented on the homunculus. Go to the Rheumatology activity and complete the homunculus joint exam.  Investigation: No additional findings.  Imaging: XR Foot 2 Views Left  Result Date: 11/12/2019 First MTP narrowing and erosive changes were noted.  None of the other MTP showed erosive changes or narrowing.  Mild PIP and DIP narrowing was noted.  No intertarsal, tibiotalar or subtalar joint space narrowing was noted. Impression: These findings are consistent with osteoarthritis and  gouty arthropathy.  XR Foot 2 Views Right  Result Date: 11/12/2019 First MTP, PIP and DIP narrowing was noted.  No intertarsal, tibiotalar or subtalar joint space narrowing was noted. Impression: These findings are consistent with osteoarthritis of the foot.  XR Hand 2 View Left  Result Date: 11/12/2019 PIP and DIP narrowing was noted.  Some erosive changes were noted in the second DIP joint.  No MCP, intercarpal radiocarpal joint space narrowing was noted. Impression: These findings are consistent with osteoarthritis and possible gouty arthropathy.  XR Hand 2 View Right  Result Date: 11/12/2019 CMC, PIP and DIP narrowing was noted.  No MCP, intercarpal radiocarpal joint space narrowing was noted.  No erosive changes were noted. Impression: These findings are consistent with osteoarthritis of the hand.   Recent Labs: Lab Results  Component Value Date   WBC 9.1 11/12/2019   HGB 15.6 11/12/2019   PLT 221 11/12/2019   NA 141 11/12/2019   K 4.6 11/12/2019   CL 103 11/12/2019   CO2 30 11/12/2019   GLUCOSE 98 11/12/2019   BUN 25 11/12/2019   CREATININE 1.37 (H) 11/12/2019   BILITOT 0.5 11/12/2019   ALKPHOS 64 03/12/2019   AST 19 11/12/2019   ALT 15 11/12/2019   PROT 7.0 11/12/2019   ALBUMIN 5.0 (H) 03/12/2019   CALCIUM 9.6  11/12/2019   GFRAA 64 11/12/2019  November 12, 2019 uric acid 7.5  Speciality Comments: No specialty comments available.  Procedures:  No procedures performed Allergies: Patient has no known allergies.   Assessment / Plan:     Visit Diagnoses: Idiopathic chronic gout of multiple sites without tophus - Uric acid was 7.5 on allopurinol 200 mg p.o. daily.  The dose was increased at the last visit on November 16 2019 to 300 mg p.o. daily.  He has a prescription of colchicine which he will use only on as needed basis.  He has not taken any colchicine since the last visit.  He denies having any gout flare.  Medication monitoring-we will check CBC and CMP today.  Primary osteoarthritis of both hands-he has DIP and PIP thickening and some stiffness in his hands.  Use of natural anti-inflammatories was discussed.  Primary osteoarthritis of both feet-doing well.  DDD (degenerative disc disease), lumbar - According to patient surgery was advised in the past by the neurosurgeon.  He has limited range of motion.  Some stretching exercises were demonstrated in the office today.  CKD (chronic kidney disease) stage 2, GFR 60-89 ml/min  Chronic diastolic CHF (congestive heart failure) (H[facility])  Atherosclerosis of coronary artery bypass graft of native heart without angina pectoris  History of hyperlipidemia  History of atrial fibrillation  Other insomnia  Orders: Orders Placed This Encounter  Procedures  . CBC with Differential/Platelet  . COMPLETE METABOLIC PANEL WITH GFR  . Uric acid   No orders of the defined types were placed in this encounter.   Follow-Up Instructions: Return in about 3 months (around 03/09/2020) for Gout, Osteoarthritis.   Bo Merino, MD  Note - This record has been created using Editor, commissioning.  Chart creation errors have been sought, but may not always  have been located. Such creation errors do not reflect on  the standard of medical care.

## 2019-12-09 ENCOUNTER — Encounter: Payer: Self-pay | Admitting: Rheumatology

## 2019-12-09 ENCOUNTER — Other Ambulatory Visit: Payer: Self-pay

## 2019-12-09 ENCOUNTER — Ambulatory Visit: Payer: BC Managed Care – PPO | Admitting: Rheumatology

## 2019-12-09 VITALS — BP 114/67 | HR 61 | Resp 16 | Ht 67.0 in | Wt 190.6 lb

## 2019-12-09 DIAGNOSIS — G4709 Other insomnia: Secondary | ICD-10-CM

## 2019-12-09 DIAGNOSIS — M1A09X0 Idiopathic chronic gout, multiple sites, without tophus (tophi): Secondary | ICD-10-CM

## 2019-12-09 DIAGNOSIS — M19041 Primary osteoarthritis, right hand: Secondary | ICD-10-CM | POA: Diagnosis not present

## 2019-12-09 DIAGNOSIS — Z5181 Encounter for therapeutic drug level monitoring: Secondary | ICD-10-CM | POA: Diagnosis not present

## 2019-12-09 DIAGNOSIS — M5136 Other intervertebral disc degeneration, lumbar region: Secondary | ICD-10-CM

## 2019-12-09 DIAGNOSIS — N182 Chronic kidney disease, stage 2 (mild): Secondary | ICD-10-CM

## 2019-12-09 DIAGNOSIS — Z8679 Personal history of other diseases of the circulatory system: Secondary | ICD-10-CM

## 2019-12-09 DIAGNOSIS — M19042 Primary osteoarthritis, left hand: Secondary | ICD-10-CM

## 2019-12-09 DIAGNOSIS — I5032 Chronic diastolic (congestive) heart failure: Secondary | ICD-10-CM

## 2019-12-09 DIAGNOSIS — Z8639 Personal history of other endocrine, nutritional and metabolic disease: Secondary | ICD-10-CM

## 2019-12-09 DIAGNOSIS — M19071 Primary osteoarthritis, right ankle and foot: Secondary | ICD-10-CM | POA: Diagnosis not present

## 2019-12-09 DIAGNOSIS — M19072 Primary osteoarthritis, left ankle and foot: Secondary | ICD-10-CM

## 2019-12-09 DIAGNOSIS — I25810 Atherosclerosis of coronary artery bypass graft(s) without angina pectoris: Secondary | ICD-10-CM

## 2019-12-10 LAB — COMPLETE METABOLIC PANEL WITH GFR
AG Ratio: 2.4 (calc) (ref 1.0–2.5)
ALT: 18 U/L (ref 9–46)
AST: 17 U/L (ref 10–35)
Albumin: 4.5 g/dL (ref 3.6–5.1)
Alkaline phosphatase (APISO): 52 U/L (ref 35–144)
BUN: 24 mg/dL (ref 7–25)
CO2: 31 mmol/L (ref 20–32)
Calcium: 9.3 mg/dL (ref 8.6–10.3)
Chloride: 103 mmol/L (ref 98–110)
Creat: 1.21 mg/dL (ref 0.70–1.25)
GFR, Est African American: 73 mL/min/{1.73_m2} (ref >=60)
GFR, Est Non African American: 63 mL/min/{1.73_m2} (ref >=60)
Globulin: 1.9 g/dL (calc) (ref 1.9–3.7)
Glucose, Bld: 104 mg/dL — ABNORMAL HIGH (ref 65–99)
Potassium: 4.7 mmol/L (ref 3.5–5.3)
Sodium: 139 mmol/L (ref 135–146)
Total Bilirubin: 0.4 mg/dL (ref 0.2–1.2)
Total Protein: 6.4 g/dL (ref 6.1–8.1)

## 2019-12-10 LAB — CBC WITH DIFFERENTIAL/PLATELET
Absolute Monocytes: 1596 cells/uL — ABNORMAL HIGH (ref 200–950)
Basophils Absolute: 42 cells/uL (ref 0–200)
Basophils Relative: 0.5 %
Eosinophils Absolute: 143 cells/uL (ref 15–500)
Eosinophils Relative: 1.7 %
HCT: 44.2 % (ref 38.5–50.0)
Hemoglobin: 14.7 g/dL (ref 13.2–17.1)
Lymphs Abs: 1915 cells/uL (ref 850–3900)
MCH: 31.3 pg (ref 27.0–33.0)
MCHC: 33.3 g/dL (ref 32.0–36.0)
MCV: 94 fL (ref 80.0–100.0)
MPV: 10.7 fL (ref 7.5–12.5)
Monocytes Relative: 19 %
Neutro Abs: 4704 cells/uL (ref 1500–7800)
Neutrophils Relative %: 56 %
Platelets: 196 10*3/uL (ref 140–400)
RBC: 4.7 10*6/uL (ref 4.20–5.80)
RDW: 12.8 % (ref 11.0–15.0)
Total Lymphocyte: 22.8 %
WBC: 8.4 10*3/uL (ref 3.8–10.8)

## 2019-12-10 LAB — URIC ACID: Uric Acid, Serum: 6.3 mg/dL (ref 4.0–8.0)

## 2019-12-10 NOTE — Progress Notes (Signed)
CBC and CMP are stable.  Uric acid of 6.3, which is better but not below 6.0.  We will recheck values in 3 months at the follow-up visit.

## 2019-12-14 ENCOUNTER — Other Ambulatory Visit: Payer: Self-pay | Admitting: Family Medicine

## 2019-12-14 DIAGNOSIS — R7989 Other specified abnormal findings of blood chemistry: Secondary | ICD-10-CM

## 2019-12-14 DIAGNOSIS — E291 Testicular hypofunction: Secondary | ICD-10-CM

## 2019-12-14 NOTE — Telephone Encounter (Signed)
Patient is requesting a refill of the following medications: Requested Prescriptions   Pending Prescriptions Disp Refills   Testosterone 30 MG/ACT SOLN [Pharmacy Med Name: TESTOSTERONE 30 MG/1.5 ML PUMP] 90 mL 0    Sig: APPLY 1 PUMP EVERY DAY    Date of patient request: 12/14/19 Last office visit: 10/19/19 Date of last refill: 09/09/19 Last refill amount:73ml- 0RF  Follow up time period per chart:

## 2019-12-14 NOTE — Telephone Encounter (Signed)
Requested medication (s) are due for refill today: Yes  Requested medication (s) are on the active medication list: Yes  Last refill:  09/09/19  Future visit scheduled: No  Notes to clinic:  See request.    Requested Prescriptions  Pending Prescriptions Disp Refills   Testosterone 30 MG/ACT SOLN [Pharmacy Med Name: TESTOSTERONE 30 MG/1.5 ML PUMP] 90 mL 0    Sig: APPLY 1 PUMP EVERY DAY      Off-Protocol Failed - 12/14/2019  1:09 PM      Failed - Medication not assigned to a protocol, review manually.      Passed - Valid encounter within last 12 months    Recent Outpatient Visits           1 month ago Gout, unspecified cause, unspecified chronicity, unspecified site   [facility], Ranell Patrick, MD   3 months ago Bilateral foot pain   [facility], Ranell Patrick, MD   3 months ago Gout, unspecified cause, unspecified chronicity, unspecified site   [facility], Ranell Patrick, MD   3 months ago    [facility], Ranell Patrick, MD   3 months ago Gout, unspecified cause, unspecified chronicity, unspecified site   [facility], NP       Future Appointments             In 2 months Bo Merino, MD [facility]

## 2020-01-22 ENCOUNTER — Other Ambulatory Visit: Payer: Self-pay

## 2020-01-22 ENCOUNTER — Ambulatory Visit (INDEPENDENT_AMBULATORY_CARE_PROVIDER_SITE_OTHER): Payer: BC Managed Care – PPO

## 2020-01-22 ENCOUNTER — Encounter: Payer: Self-pay | Admitting: Family Medicine

## 2020-01-22 ENCOUNTER — Telehealth: Payer: Self-pay

## 2020-01-22 ENCOUNTER — Ambulatory Visit: Payer: BC Managed Care – PPO | Admitting: Family Medicine

## 2020-01-22 VITALS — BP 128/76 | HR 73 | Temp 97.6°F | Ht 67.0 in | Wt 183.8 lb

## 2020-01-22 DIAGNOSIS — M109 Gout, unspecified: Secondary | ICD-10-CM

## 2020-01-22 DIAGNOSIS — S93401A Sprain of unspecified ligament of right ankle, initial encounter: Secondary | ICD-10-CM

## 2020-01-22 NOTE — Telephone Encounter (Signed)
Called pt and informed him that his Xray of ankle came back negative per provider request. Pt stated understanding.

## 2020-01-22 NOTE — Patient Instructions (Addendum)
This appears just to be a sprain but I suspect that some gout may have flared as a consequence to that.  I would recommend avoiding weightbearing by using the crutches.  You can get a little ankle support for Ace wrap to help a little.  I know you would prefer not to have the big orthopedic boot, but if you keep having pain we should go that route.  Take colchicine and see if it will help if there is a component of gout involved.  Take if you keep having problems with also you to orthopedist or sports medicine doctor.   Ankle Sprain  An ankle sprain is a stretch or tear in a ligament in the ankle. Ligaments are tissues that connect bones to each other. The two most common types of ankle sprains are:  Inversion sprain. This happens when the foot turns inward and the ankle rolls outward. It affects the ligament on the outside of the foot (lateral ligament).  Eversion sprain. This happens when the foot turns outward and the ankle rolls inward. It affects the ligament on the inner side of the foot (medial ligament). What are the causes? This condition is often caused by accidentally rolling or twisting the ankle. What increases the risk? You are more likely to develop this condition if you play sports. What are the signs or symptoms? Symptoms of this condition include:  Pain in your ankle.  Swelling.  Bruising. This may develop right after you sprain your ankle or 1-2 days later.  Trouble standing or walking, especially when you turn or change directions. How is this diagnosed? This condition is diagnosed with:  A physical exam. During the exam, your health care provider will press on certain parts of your foot and ankle and try to move them in certain ways.  X-ray imaging. These may be taken to see how severe the sprain is and to check for broken bones. How is this treated? This condition may be treated with:  A brace or splint. This is used to keep the ankle from moving until it  heals.  An elastic bandage. This is used to support the ankle.  Crutches.  Pain medicine.  Surgery. This may be needed if the sprain is severe.  Physical therapy. This may help to improve the range of motion in the ankle. Follow these instructions at home: If you have a brace or a splint:  Wear the brace or splint as told by your health care provider. Remove it only as told by your health care provider.  Loosen the brace or splint if your toes tingle, become numb, or turn cold and blue.  Keep the brace or splint clean.  If the brace or splint is not waterproof: ? Do not let it get wet. ? Cover it with a watertight covering when you take a bath or a shower. If you have an elastic bandage (dressing):  Remove it to shower or bathe.  Try not to move your ankle much, but wiggle your toes from time to time. This helps to prevent swelling.  Adjust the dressing to make it more comfortable if it feels too tight.  Loosen the dressing if you have numbness or tingling in your foot, or if your foot becomes cold and blue. Managing pain, stiffness, and swelling   Take over-the-counter and prescription medicines only as told by your health care provider.  For 2-3 days, keep your ankle raised (elevated) above the level of your heart as much as possible.  If directed, put ice on the injured area: ? If you have a removable brace or splint, remove it as told by your health care provider. ? Put ice in a plastic bag. ? Place a towel between your skin and the bag. ? Leave the ice on for 20 minutes, 2-3 times a day. General instructions  Rest your ankle.  Do not use the injured limb to support your body weight until your health care provider says that you can. Use crutches as told by your health care provider.  Do not use any products that contain nicotine or tobacco, such as cigarettes, e-cigarettes, and chewing tobacco. If you need help quitting, ask your health care provider.  Keep all  follow-up visits as told by your health care provider. This is important. Contact a health care provider if:  You have rapidly increasing bruising or swelling.  Your pain is not relieved with medicine. Get help right away if:  Your foot or toes become numb or blue.  You have severe pain that gets worse. Summary  An ankle sprain is a stretch or tear in a ligament in the ankle. Ligaments are tissues that connect bones to each other.  This condition is often caused by accidentally rolling or twisting the ankle.  Symptoms include pain, swelling, bruising, and trouble walking.  To relieve pain and swelling, put ice on the affected ankle, raise your ankle above the level of your heart, and use an elastic bandage.  Keep all follow-up visits as told by your health care provider. This is important. This information is not intended to replace advice given to you by your health care provider. Make sure you discuss any questions you have with your health care provider. Document Revised: 04/21/2018 Document Reviewed: 12/24/2017 Elsevier Patient Education  [employer].     If you have lab work done today you will be contacted with your lab results within the next 2 weeks.  If you have not heard from [place] then please contact us. The fastest way to get your results is to register for My Chart.   IF you received an x-ray today, you will receive an invoice from [facility]. Please contact [facility] at [phone] with questions or concerns regarding your invoice.   IF you received labwork today, you will receive an invoice from [facility]. Please contact [facility] at [phone] with questions or concerns regarding your invoice.   Our billing staff will not be able to assist you with questions regarding bills from these companies.  You will be contacted with the lab results as soon as they are available. The fastest way to get your results is to activate your My Chart  account. Instructions are located on the last page of this paperwork. If you have not heard from [place] regarding the results in 2 weeks, please contact this office.

## 2020-01-22 NOTE — Progress Notes (Signed)
Patient ID: Jeremy Hendricks, male    DOB: 1955/09/07  Age: 64 y.o. MRN: [id number]  Chief Complaint  Patient presents with  . sprained ankle    Pt stated that he was moving some shutters and he tried to break it up and he slipped and fell in the process    Subjective:   63 year old man who was smashing a shutter with his foot a couple of days ago and felt a little pain in the ankle.  Last night it started throbbing him terribly during the nighttime ankle swelled up more.  He had gone to the gym last night also.  He does have a history of gout, uric acid was good a about 6 weeks ago.  He is on medication for that.  Current allergies, medications, problem list, past/family and social histories reviewed.  Objective:  BP 128/76 (BP Location: Right Arm, Patient Position: Sitting, Cuff Size: Normal)   Pulse 73   Temp 97.6 F (36.4 C) (Temporal)   Ht 5\' 7"  (1.702 m)   Wt 183 lb 12.8 oz (83.4 kg)   SpO2 95%   BMI 28.79 kg/m   Swollen right ankle.  Tender both medially and laterally.  More swelling laterally.  Tender on up the distal end of the fibula about 4 inches.  Assessment & Plan:   Assessment: 1. Sprain of right ankle, unspecified ligament, initial encounter   2. Gout, unspecified cause, unspecified chronicity, unspecified site       Patient Instructions   This appears just to be a sprain but I suspect that some gout may have flared as a consequence to that.  I would recommend avoiding weightbearing by using the crutches.  You can get a little ankle support for Ace wrap to help a little.  I know you would prefer not to have the big orthopedic boot, but if you keep having pain we should go that route.  Take colchicine and see if it will help if there is a component of gout involved.  Take if you keep having problems with also you to orthopedist or sports medicine doctor.   Ankle Sprain  An ankle sprain is a stretch or tear in a ligament in the ankle. Ligaments are tissues  that connect bones to each other. The two most common types of ankle sprains are:  Inversion sprain. This happens when the foot turns inward and the ankle rolls outward. It affects the ligament on the outside of the foot (lateral ligament).  Eversion sprain. This happens when the foot turns outward and the ankle rolls inward. It affects the ligament on the inner side of the foot (medial ligament). What are the causes? This condition is often caused by accidentally rolling or twisting the ankle. What increases the risk? You are more likely to develop this condition if you play sports. What are the signs or symptoms? Symptoms of this condition include:  Pain in your ankle.  Swelling.  Bruising. This may develop right after you sprain your ankle or 1-2 days later.  Trouble standing or walking, especially when you turn or change directions. How is this diagnosed? This condition is diagnosed with:  A physical exam. During the exam, your health care provider will press on certain parts of your foot and ankle and try to move them in certain ways.  X-ray imaging. These may be taken to see how severe the sprain is and to check for broken bones. How is this treated? This condition may be treated with:  A brace or splint. This is used to keep the ankle from moving until it heals.  An elastic bandage. This is used to support the ankle.  Crutches.  Pain medicine.  Surgery. This may be needed if the sprain is severe.  Physical therapy. This may help to improve the range of motion in the ankle. Follow these instructions at home: If you have a brace or a splint:  Wear the brace or splint as told by your health care provider. Remove it only as told by your health care provider.  Loosen the brace or splint if your toes tingle, become numb, or turn cold and blue.  Keep the brace or splint clean.  If the brace or splint is not waterproof: ? Do not let it get wet. ? Cover it with a  watertight covering when you take a bath or a shower. If you have an elastic bandage (dressing):  Remove it to shower or bathe.  Try not to move your ankle much, but wiggle your toes from time to time. This helps to prevent swelling.  Adjust the dressing to make it more comfortable if it feels too tight.  Loosen the dressing if you have numbness or tingling in your foot, or if your foot becomes cold and blue. Managing pain, stiffness, and swelling   Take over-the-counter and prescription medicines only as told by your health care provider.  For 2-3 days, keep your ankle raised (elevated) above the level of your heart as much as possible.  If directed, put ice on the injured area: ? If you have a removable brace or splint, remove it as told by your health care provider. ? Put ice in a plastic bag. ? Place a towel between your skin and the bag. ? Leave the ice on for 20 minutes, 2-3 times a day. General instructions  Rest your ankle.  Do not use the injured limb to support your body weight until your health care provider says that you can. Use crutches as told by your health care provider.  Do not use any products that contain nicotine or tobacco, such as cigarettes, e-cigarettes, and chewing tobacco. If you need help quitting, ask your health care provider.  Keep all follow-up visits as told by your health care provider. This is important. Contact a health care provider if:  You have rapidly increasing bruising or swelling.  Your pain is not relieved with medicine. Get help right away if:  Your foot or toes become numb or blue.  You have severe pain that gets worse. Summary  An ankle sprain is a stretch or tear in a ligament in the ankle. Ligaments are tissues that connect bones to each other.  This condition is often caused by accidentally rolling or twisting the ankle.  Symptoms include pain, swelling, bruising, and trouble walking.  To relieve pain and swelling, put  ice on the affected ankle, raise your ankle above the level of your heart, and use an elastic bandage.  Keep all follow-up visits as told by your health care provider. This is important. This information is not intended to replace advice given to you by your health care provider. Make sure you discuss any questions you have with your health care provider. Document Revised: 04/21/2018 Document Reviewed: 12/24/2017 Elsevier Patient Education  [employer].     If you have lab work done today you will be contacted with your lab results within the next 2 weeks.  If you have not heard from  [place] then please contact us. The fastest way to get your results is to register for My Chart.   IF you received an x-ray today, you will receive an invoice from [facility]. Please contact [facility] at [phone] with questions or concerns regarding your invoice.   IF you received labwork today, you will receive an invoice from [place]. Please contact [facility] at [phone] with questions or concerns regarding your invoice.   Our billing staff will not be able to assist you with questions regarding bills from these companies.  You will be contacted with the lab results as soon as they are available. The fastest way to get your results is to activate your My Chart account. Instructions are located on the last page of this paperwork. If you have not heard from [place] regarding the results in 2 weeks, please contact this office.        Return if symptoms worsen or fail to improve.   Ruben Reason, MD 01/22/2020

## 2020-01-23 LAB — URIC ACID: Uric Acid: 7.4 mg/dL (ref 3.8–8.4)

## 2020-01-23 LAB — SEDIMENTATION RATE: Sed Rate: 2 mm/hr (ref 0–30)

## 2020-02-08 ENCOUNTER — Other Ambulatory Visit: Payer: Self-pay | Admitting: Rheumatology

## 2020-02-08 NOTE — Telephone Encounter (Signed)
Last Visit: 12/09/2019 Next Visit: 03/10/2020 Labs: 12/09/2019 CBC and CMP are stable. Uric acid of 6.3  Current Dose per office note on 12/09/2019: Allopurinol 300 mg p.o. daily DX: Idiopathic chronic gout of multiple sites without tophus   Okay to refill per Dr. Estanislado Pandy

## 2020-02-14 ENCOUNTER — Other Ambulatory Visit: Payer: Self-pay | Admitting: Family Medicine

## 2020-02-14 DIAGNOSIS — E291 Testicular hypofunction: Secondary | ICD-10-CM

## 2020-02-14 DIAGNOSIS — R7989 Other specified abnormal findings of blood chemistry: Secondary | ICD-10-CM

## 2020-02-14 NOTE — Telephone Encounter (Signed)
Requested medication (s) are due for refill today: yes  Requested medication (s) are on the active medication list: yes  Last refill:  12/15/19  Future visit scheduled: no  Notes to clinic: Med not delegated to Refill; pt needs office visit   Requested Prescriptions  Pending Prescriptions Disp Refills   Testosterone 30 MG/ACT SOLN [Pharmacy Med Name: TESTOSTERONE 30 MG/1.5 ML PUMP] 90 mL 0    Sig: APPLY 1 PUMP EVERY DAY *NEED APPT/LABS*      Off-Protocol Failed - 02/14/2020  1:15 PM      Failed - Medication not assigned to a protocol, review manually.      Passed - Valid encounter within last 12 months    Recent Outpatient Visits           3 weeks ago Sprain of right ankle, unspecified ligament, initial encounter   Primary Care at [facility], Fenton Malling, MD   3 months ago Gout, unspecified cause, unspecified chronicity, unspecified site   [facility] Care at [facility], Ranell Patrick, MD   5 months ago Bilateral foot pain   [facility] Care at [facility], Ranell Patrick, MD   5 months ago Gout, unspecified cause, unspecified chronicity, unspecified site   [facility] at [facility], Ranell Patrick, MD   5 months ago    Primary Care at [facility], Ranell Patrick, MD       Future Appointments             In 3 weeks Bo Merino, MD [facility]

## 2020-02-16 ENCOUNTER — Other Ambulatory Visit: Payer: Self-pay

## 2020-02-26 NOTE — Progress Notes (Signed)
Office Visit Note  Patient: Jeremy Hendricks             Date of Birth: 15-Feb-1956           MRN: [id number]             PCP: Wendie Agreste, MD Referring: Wendie Agreste, MD Visit Date: 03/10/2020 Occupation: @GUAROCC @  Subjective:  Pain in joints.   History of Present Illness: Jeremy Hendricks is a 64 y.o. male with history of gouty arthropathy.  He states he has had 2 flares in the last 2 weeks.  First was to weeks ago which  was in his right knee and the last week was in his left knee.  He has been experiencing pain and stiffness in his hands as well.  He states he drinks about 2 alcoholic beverages a day.  He has cut back on the alcohol intake.  He has been avoiding red meat and shellfish.  Activities of Daily Living:  Patient reports morning stiffness for 0 minutes.   Patient Denies nocturnal pain.  Difficulty dressing/grooming: Denies Difficulty climbing stairs: Denies Difficulty getting out of chair: Denies Difficulty using hands for taps, buttons, cutlery, and/or writing: Denies  Review of Systems  Constitutional: Negative for fatigue.  HENT: Negative for mouth sores, mouth dryness and nose dryness.   Eyes: Negative for itching and dryness.  Respiratory: Negative for shortness of breath and difficulty breathing.   Cardiovascular: Negative for chest pain and palpitations.  Gastrointestinal: Negative for blood in stool, constipation and diarrhea.  Endocrine: Negative for increased urination.  Genitourinary: Negative for difficulty urinating.  Musculoskeletal: Positive for arthralgias and joint pain. Negative for joint swelling, myalgias, morning stiffness, muscle tenderness and myalgias.  Skin: Negative for color change, rash and redness.  Allergic/Immunologic: Negative for susceptible to infections.  Neurological: Negative for dizziness, numbness, headaches, memory loss and weakness.  Hematological: Negative for bruising/bleeding tendency.  Psychiatric/Behavioral:  Negative for confusion.    PMFS History:  Patient Active Problem List   Diagnosis Date Noted  . Skin fissures 10/09/2018  . Skin infection 10/09/2018  . Chronic diastolic CHF (congestive heart failure) ([place]) 09/23/2018  . CKD (chronic kidney disease) stage 2, GFR 60-89 ml/min 09/23/2018  . History of ETT   . Gout   . DJD (degenerative joint disease)   . Coronary artery disease   . Atrial fibrillation ([place])   . 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 HLD (hyperlipidemia) 06/11/2016  . Hypogonadism in male 09/09/2015  . INSOMNIA 09/19/2010  . SHORTNESS OF BREATH 09/19/2010  . CHEST PAIN-PRECORDIAL 08/23/2010  . CAD, ARTERY BYPASS GRAFT 10/26/2009  . HYPERLIPIDEMIA 10/24/2009  . DEGENERATIVE JOINT DISEASE 10/24/2009  . ANGINA, HX OF 10/24/2009    Past Medical History:  Diagnosis Date  . Atrial fibrillation ([place])    postoperative  . Chronic diastolic CHF (congestive heart failure) ([place])   . CKD (chronic kidney disease), stage II   . Coronary artery disease    a. s/p CABG 2002. b. s/p redo 2012.  Marland Kitchen DJD (degenerative joint disease)   . Gout   . History of ETT    a. ETT 6/16:  normal    Family History  Problem Relation Age of Onset  . Heart attack Father 15  . Hypertension Father   . Cancer Mother        Breast cancer  . Cancer Other        family hx of  . Coronary artery disease Other  family hx of  . Hyperlipidemia Other        family hx of  . Hypertension Paternal Grandfather   . Healthy Son   . Healthy Daughter   . Stroke Neg Hx   . Colon cancer Neg Hx    Past Surgical History:  Procedure Laterality Date  . CORONARY ARTERY BYPASS GRAFT  2002  . CORONARY ARTERY BYPASS GRAFT  08-2010   L-LAD remained from original CABG; new grafts incl L radial- PDA + RIMA-RI  . VASECTOMY     Social History   Social History Narrative   Single   Education: College   Exercise: Yes   Immunization History  Administered Date(s) Administered  . Hepatitis B 09/20/2000  .  Influenza Split 05/14/2015, 06/11/2016  . Influenza,inj,Quad PF,6+ Mos 04/04/2017, 04/15/2018  . Influenza-Unspecified 04/04/2017  . PFIZER SARS-COV-2 Vaccination 10/24/2019, 11/17/2019  . Tdap 02/11/2017  . Zoster 08/13/2013     Objective: Vital Signs: BP 120/71 (BP Location: Left Arm, Patient Position: Sitting, Cuff Size: Normal)   Pulse 62   Resp 15   Ht 5\' 7"  (1.702 m)   Wt 188 lb 12.8 oz (85.6 kg)   BMI 29.57 kg/m    Physical Exam Vitals and nursing note reviewed.  Constitutional:      Appearance: He is well-developed.  HENT:     Head: Normocephalic and atraumatic.  Eyes:     Conjunctiva/sclera: Conjunctivae normal.     Pupils: Pupils are equal, round, and reactive to light.  Cardiovascular:     Rate and Rhythm: Normal rate and regular rhythm.     Heart sounds: Normal heart sounds.  Pulmonary:     Effort: Pulmonary effort is normal.     Breath sounds: Normal breath sounds.  Abdominal:     General: Bowel sounds are normal.     Palpations: Abdomen is soft.  Musculoskeletal:     Cervical back: Normal range of motion and neck supple.  Skin:    General: Skin is warm and dry.     Capillary Refill: Capillary refill takes less than 2 seconds.  Neurological:     Mental Status: He is alert and oriented to person, place, and time.  Psychiatric:        Behavior: Behavior normal.      Musculoskeletal Exam: C-spine thoracic and lumbar spine with good range of motion.  Shoulder joints, elbow joints, wrist joints with good range of motion.  Hip joints, knee joints, ankles, MTPs and PIPs with good range of motion.  He has DIP and PIP thickening in his bilateral hands and his bilateral feet.  CDAI Exam: CDAI Score: -- Patient Global: --; Provider Global: -- Swollen: --; Tender: -- Joint Exam 03/10/2020   No joint exam has been documented for this visit   There is currently no information documented on the homunculus. Go to the Rheumatology activity and complete the  homunculus joint exam.  Investigation: No additional findings.  Imaging: No results found.  Recent Labs: Lab Results  Component Value Date   WBC 8.4 12/09/2019   HGB 14.7 12/09/2019   PLT 196 12/09/2019   NA 139 12/09/2019   K 4.7 12/09/2019   CL 103 12/09/2019   CO2 31 12/09/2019   GLUCOSE 104 (H) 12/09/2019   BUN 24 12/09/2019   CREATININE 1.21 12/09/2019   BILITOT 0.4 12/09/2019   ALKPHOS 64 03/12/2019   AST 17 12/09/2019   ALT 18 12/09/2019   PROT 6.4 12/09/2019   ALBUMIN 5.0 (H)  03/12/2019   CALCIUM 9.3 12/09/2019   GFRAA 73 12/09/2019    Speciality Comments: No specialty comments available.  Procedures:  No procedures performed Allergies: Patient has no known allergies.   Assessment / Plan:     Visit Diagnoses: Idiopathic chronic gout of multiple sites without tophus -he continues to have intermittent gout flares.  Although the episodes have decreased in number.  He had 2 flares in the last 2 weeks.  His uric acid is not in the desirable range.  We had detailed discussion about gout in his management.  He states he is cut down on the alcohol intake but he is a still drinking about 2 alcoholic beverages a day.  Avoidance of all red meat and shellfish was emphasized.  Have advised him to stay on colchicine 1 tablet p.o. daily and increase allopurinol to 1-1/2 tablet p.o. daily.  We will recheck labs in 1 month which will include CBC, CMP and uric acid.  Medication monitoring encounter-we will check labs in 4 weeks.  Primary osteoarthritis of both hands-he has some DIP and PIP thickening which causes discomfort.  Joint protection was discussed.  Primary osteoarthritis of both feet-currently not having much discomfort.  DDD (degenerative disc disease), lumbar-he has intermittent lower back pain.  CKD (chronic kidney disease) stage 2, GFR 60-89 ml/min-GFR has been stable.  Chronic diastolic CHF (congestive heart failure) ([facility])-he is on Lasix and  beta-blockers.   DASH diet was discussed.  Which is also useful in gout.  Atherosclerosis of coronary artery bypass graft of native heart without angina pectoris  History of hyperlipidemia-he is on Crestor.  History of atrial fibrillation  Other insomnia  Orders: No orders of the defined types were placed in this encounter.  Meds ordered this encounter  Medications  . allopurinol (ZYLOPRIM) 300 MG tablet    Sig: Take 1.5 tablets (450 mg total) by mouth daily.    Dispense:  135 tablet    Refill:  0      Follow-Up Instructions: Return in about 3 months (around 06/10/2020) for Gout, Osteoarthritis.   Bo Merino, MD  Note - This record has been created using Editor, commissioning.  Chart creation errors have been sought, but may not always  have been located. Such creation errors do not reflect on  the standard of medical care.

## 2020-02-29 ENCOUNTER — Encounter: Payer: Self-pay | Admitting: Rheumatology

## 2020-02-29 ENCOUNTER — Other Ambulatory Visit: Payer: Self-pay | Admitting: Family Medicine

## 2020-02-29 DIAGNOSIS — M109 Gout, unspecified: Secondary | ICD-10-CM

## 2020-02-29 MED ORDER — PREDNISONE 5 MG PO TABS: ORAL_TABLET | ORAL | 0 refills | Status: DC

## 2020-02-29 NOTE — Telephone Encounter (Signed)
Okay to call in prednisone 20 mg and taper by 5 mg every 2 days.

## 2020-03-10 ENCOUNTER — Ambulatory Visit: Payer: BC Managed Care – PPO | Admitting: Rheumatology

## 2020-03-10 ENCOUNTER — Encounter: Payer: Self-pay | Admitting: Rheumatology

## 2020-03-10 ENCOUNTER — Other Ambulatory Visit: Payer: Self-pay

## 2020-03-10 VITALS — BP 120/71 | HR 62 | Resp 15 | Ht 67.0 in | Wt 188.8 lb

## 2020-03-10 DIAGNOSIS — M19041 Primary osteoarthritis, right hand: Secondary | ICD-10-CM | POA: Diagnosis not present

## 2020-03-10 DIAGNOSIS — Z8639 Personal history of other endocrine, nutritional and metabolic disease: Secondary | ICD-10-CM

## 2020-03-10 DIAGNOSIS — M1A09X0 Idiopathic chronic gout, multiple sites, without tophus (tophi): Secondary | ICD-10-CM | POA: Diagnosis not present

## 2020-03-10 DIAGNOSIS — I5032 Chronic diastolic (congestive) heart failure: Secondary | ICD-10-CM

## 2020-03-10 DIAGNOSIS — Z5181 Encounter for therapeutic drug level monitoring: Secondary | ICD-10-CM

## 2020-03-10 DIAGNOSIS — M19071 Primary osteoarthritis, right ankle and foot: Secondary | ICD-10-CM | POA: Diagnosis not present

## 2020-03-10 DIAGNOSIS — I25810 Atherosclerosis of coronary artery bypass graft(s) without angina pectoris: Secondary | ICD-10-CM

## 2020-03-10 DIAGNOSIS — Z8679 Personal history of other diseases of the circulatory system: Secondary | ICD-10-CM

## 2020-03-10 DIAGNOSIS — M5136 Other intervertebral disc degeneration, lumbar region: Secondary | ICD-10-CM

## 2020-03-10 DIAGNOSIS — M19042 Primary osteoarthritis, left hand: Secondary | ICD-10-CM

## 2020-03-10 DIAGNOSIS — N182 Chronic kidney disease, stage 2 (mild): Secondary | ICD-10-CM

## 2020-03-10 DIAGNOSIS — M19072 Primary osteoarthritis, left ankle and foot: Secondary | ICD-10-CM

## 2020-03-10 DIAGNOSIS — M51369 Other intervertebral disc degeneration, lumbar region without mention of lumbar back pain or lower extremity pain: Secondary | ICD-10-CM

## 2020-03-10 DIAGNOSIS — G4709 Other insomnia: Secondary | ICD-10-CM

## 2020-03-10 MED ORDER — ALLOPURINOL 300 MG PO TABS: 450.0000 mg | ORAL_TABLET | Freq: Every day | ORAL | 0 refills | Status: DC

## 2020-03-10 NOTE — Patient Instructions (Addendum)
Return in 1 month for CBC, CMP with GFR and uric acid.  Please take colchicine 0.6 mg p.o. daily for next month.  Increase allopurinol to 1-1/2 tablet p.o. daily.

## 2020-03-11 ENCOUNTER — Other Ambulatory Visit: Payer: Self-pay | Admitting: Physician Assistant

## 2020-03-13 ENCOUNTER — Other Ambulatory Visit: Payer: Self-pay | Admitting: Physician Assistant

## 2020-03-14 MED ORDER — FUROSEMIDE 40 MG PO TABS: ORAL_TABLET | ORAL | 1 refills | Status: DC

## 2020-03-23 ENCOUNTER — Other Ambulatory Visit: Payer: Self-pay | Admitting: Family Medicine

## 2020-03-23 DIAGNOSIS — Z113 Encounter for screening for infections with a predominantly sexual mode of transmission: Secondary | ICD-10-CM

## 2020-03-23 NOTE — Telephone Encounter (Signed)
Requested medications are due for refill today?  Yes  Requested medications are on active medication list?  Yes  Last Refill:   03/12/2019  # 90 with two refills   Future visit scheduled? No   Notes to Clinic: Medication not assigned to a protocol.

## 2020-04-15 ENCOUNTER — Other Ambulatory Visit: Payer: Self-pay | Admitting: Rheumatology

## 2020-04-15 ENCOUNTER — Other Ambulatory Visit: Payer: Self-pay | Admitting: Family Medicine

## 2020-04-15 DIAGNOSIS — M1A09X0 Idiopathic chronic gout, multiple sites, without tophus (tophi): Secondary | ICD-10-CM

## 2020-04-15 DIAGNOSIS — R7989 Other specified abnormal findings of blood chemistry: Secondary | ICD-10-CM

## 2020-04-15 DIAGNOSIS — E291 Testicular hypofunction: Secondary | ICD-10-CM

## 2020-04-15 NOTE — Telephone Encounter (Signed)
Requested medications are due for refill today?   Yes  Requested medications are on active medication list?  Yes  Last Refill:  7/6/202021  # 90 mL with no refills  Future visit scheduled? No   Notes to Clinic:  Medication is not assigned to a protocol.  Please review.

## 2020-04-19 NOTE — Telephone Encounter (Signed)
Last Visit: 03/10/2020 Next Visit: 06/09/2020 Labs: 12/09/2019 CBC and CMP are stable. Uric acid of 6.3. Uric Acid 7.4 on 01/22/2020   Current Dose per office note 03/10/2020: colchicine 1 tablet p.o. daily DX: Idiopathic chronic gout of multiple sites without tophus   Okay to refill per Dr. Estanislado Pandy

## 2020-04-22 ENCOUNTER — Other Ambulatory Visit: Payer: Self-pay | Admitting: *Deleted

## 2020-04-22 DIAGNOSIS — M1A09X0 Idiopathic chronic gout, multiple sites, without tophus (tophi): Secondary | ICD-10-CM

## 2020-04-22 DIAGNOSIS — Z5181 Encounter for therapeutic drug level monitoring: Secondary | ICD-10-CM

## 2020-04-24 NOTE — Progress Notes (Signed)
Labs are stable.  Creatinine is elevated most likely due to the use of diuretics.

## 2020-04-25 ENCOUNTER — Encounter: Payer: Self-pay | Admitting: Family Medicine

## 2020-04-25 ENCOUNTER — Encounter: Payer: Self-pay | Admitting: Rheumatology

## 2020-04-25 DIAGNOSIS — E291 Testicular hypofunction: Secondary | ICD-10-CM

## 2020-04-25 DIAGNOSIS — R7989 Other specified abnormal findings of blood chemistry: Secondary | ICD-10-CM

## 2020-04-25 NOTE — Progress Notes (Signed)
Please add uric acid level.  He recently increased his dose of allopurinol to 450 mg daily.

## 2020-04-25 NOTE — Telephone Encounter (Signed)
Spoke with patient and advised patient on lab results.

## 2020-04-26 LAB — CBC WITH DIFFERENTIAL/PLATELET
Absolute Monocytes: 1897 cells/uL — ABNORMAL HIGH (ref 200–950)
Basophils Absolute: 47 cells/uL (ref 0–200)
Basophils Relative: 0.5 %
Eosinophils Absolute: 260 cells/uL (ref 15–500)
Eosinophils Relative: 2.8 %
HCT: 49.1 % (ref 38.5–50.0)
Hemoglobin: 16.1 g/dL (ref 13.2–17.1)
Lymphs Abs: 1944 cells/uL (ref 850–3900)
MCH: 31.8 pg (ref 27.0–33.0)
MCHC: 32.8 g/dL (ref 32.0–36.0)
MCV: 97 fL (ref 80.0–100.0)
MPV: 10.7 fL (ref 7.5–12.5)
Monocytes Relative: 20.4 %
Neutro Abs: 5152 cells/uL (ref 1500–7800)
Neutrophils Relative %: 55.4 %
Platelets: 229 10*3/uL (ref 140–400)
RBC: 5.06 10*6/uL (ref 4.20–5.80)
RDW: 12.7 % (ref 11.0–15.0)
Total Lymphocyte: 20.9 %
WBC: 9.3 10*3/uL (ref 3.8–10.8)

## 2020-04-26 LAB — COMPLETE METABOLIC PANEL WITH GFR
AG Ratio: 2.1 (calc) (ref 1.0–2.5)
ALT: 14 U/L (ref 9–46)
AST: 16 U/L (ref 10–35)
Albumin: 4.8 g/dL (ref 3.6–5.1)
Alkaline phosphatase (APISO): 57 U/L (ref 35–144)
BUN/Creatinine Ratio: 15 (calc) (ref 6–22)
BUN: 21 mg/dL (ref 7–25)
CO2: 30 mmol/L (ref 20–32)
Calcium: 9.5 mg/dL (ref 8.6–10.3)
Chloride: 100 mmol/L (ref 98–110)
Creat: 1.42 mg/dL — ABNORMAL HIGH (ref 0.70–1.25)
GFR, Est African American: 60 mL/min/{1.73_m2} (ref >=60)
GFR, Est Non African American: 52 mL/min/{1.73_m2} — ABNORMAL LOW (ref >=60)
Globulin: 2.3 g/dL (calc) (ref 1.9–3.7)
Glucose, Bld: 93 mg/dL (ref 65–99)
Potassium: 4.5 mmol/L (ref 3.5–5.3)
Sodium: 143 mmol/L (ref 135–146)
Total Bilirubin: 0.4 mg/dL (ref 0.2–1.2)
Total Protein: 7.1 g/dL (ref 6.1–8.1)

## 2020-04-26 LAB — TEST AUTHORIZATION

## 2020-04-26 LAB — URIC ACID: Uric Acid, Serum: 5.3 mg/dL (ref 4.0–8.0)

## 2020-04-26 MED ORDER — TESTOSTERONE 30 MG/ACT TD SOLN: TRANSDERMAL | 0 refills | Status: DC

## 2020-04-26 NOTE — Progress Notes (Signed)
Uric acid level is better.  He should have repeat BMP with GFR in 1 month after drinking enough fluids.

## 2020-04-27 ENCOUNTER — Telehealth: Payer: Self-pay | Admitting: *Deleted

## 2020-04-27 DIAGNOSIS — Z5181 Encounter for therapeutic drug level monitoring: Secondary | ICD-10-CM

## 2020-04-27 NOTE — Telephone Encounter (Signed)
-----   Message from Bo Merino, MD sent at 04/26/2020  4:47 PM EDT ----- Uric acid level is better.  He should have repeat BMP with GFR in 1 month after drinking enough fluids.

## 2020-05-04 ENCOUNTER — Encounter: Payer: Self-pay | Admitting: Family Medicine

## 2020-05-16 ENCOUNTER — Other Ambulatory Visit: Payer: Self-pay | Admitting: Family Medicine

## 2020-05-16 DIAGNOSIS — Z113 Encounter for screening for infections with a predominantly sexual mode of transmission: Secondary | ICD-10-CM

## 2020-05-16 NOTE — Telephone Encounter (Signed)
Requested medication (s) are due for refill today: yes  Requested medication (s) are on the active medication list: yes  Last refill:  03/18/20  Future visit scheduled: yes  Notes to clinic:  med not assigned to a protocol   Requested Prescriptions  Pending Prescriptions Disp Refills   emtricitabine-tenofovir (TRUVADA) 200-300 MG tablet [Pharmacy Med Name: EMTRICITABINE/TENOF 200/300MG ] 30 tablet 0    Sig: TAKE ONE TABLET BY MOUTH ONCE DAILY WITH OR WITHOUT FOOD. STORE IN ORIGINAL CONTAINER AT ROOM TEMPERATURE.      Off-Protocol Failed - 05/16/2020 11:44 AM      Failed - Medication not assigned to a protocol, review manually.      Passed - Valid encounter within last 12 months    Recent Outpatient Visits           3 months ago Sprain of right ankle, unspecified ligament, initial encounter   Primary Care at [facility] Fenton Malling, MD   7 months ago Gout, unspecified cause, unspecified chronicity, unspecified site   [facility] at [facility], Ranell Patrick, MD   8 months ago Bilateral foot pain   [facility] at [facility], Ranell Patrick, MD   8 months ago Gout, unspecified cause, unspecified chronicity, unspecified site   [facility] at [facility], Ranell Patrick, MD   9 months ago    Primary Care at [facility], Ranell Patrick, MD       Future Appointments             In 1 week Carlota Raspberry Ranell Patrick, MD Primary [facility] at [place]   In 3 weeks Bo Merino, MD [facility]

## 2020-05-23 ENCOUNTER — Encounter: Payer: Self-pay | Admitting: Family Medicine

## 2020-05-23 ENCOUNTER — Other Ambulatory Visit: Payer: Self-pay

## 2020-05-23 ENCOUNTER — Ambulatory Visit: Payer: BC Managed Care – PPO | Admitting: Family Medicine

## 2020-05-23 VITALS — BP 112/62 | HR 60 | Temp 97.6°F | Ht 67.0 in | Wt 186.8 lb

## 2020-05-23 DIAGNOSIS — Z113 Encounter for screening for infections with a predominantly sexual mode of transmission: Secondary | ICD-10-CM

## 2020-05-23 DIAGNOSIS — R7989 Other specified abnormal findings of blood chemistry: Secondary | ICD-10-CM

## 2020-05-23 DIAGNOSIS — Z79899 Other long term (current) drug therapy: Secondary | ICD-10-CM

## 2020-05-23 DIAGNOSIS — N1831 Chronic kidney disease, stage 3a: Secondary | ICD-10-CM

## 2020-05-23 DIAGNOSIS — D72821 Monocytosis (symptomatic): Secondary | ICD-10-CM

## 2020-05-23 DIAGNOSIS — E291 Testicular hypofunction: Secondary | ICD-10-CM | POA: Diagnosis not present

## 2020-05-23 DIAGNOSIS — M109 Gout, unspecified: Secondary | ICD-10-CM | POA: Diagnosis not present

## 2020-05-23 DIAGNOSIS — E781 Pure hyperglyceridemia: Secondary | ICD-10-CM

## 2020-05-23 DIAGNOSIS — Z5181 Encounter for therapeutic drug level monitoring: Secondary | ICD-10-CM

## 2020-05-23 MED ORDER — TESTOSTERONE 30 MG/ACT TD SOLN: TRANSDERMAL | 3 refills | Status: DC

## 2020-05-23 MED ORDER — EMTRICITABINE-TENOFOVIR DF 200-300 MG PO TABS: 1.0000 | ORAL_TABLET | Freq: Every day | ORAL | 2 refills | Status: DC

## 2020-05-23 NOTE — Patient Instructions (Addendum)
  No change in medications at this time.  I will check some screening labs.  Let me know if there are questions.  The labs are checked today are also ones that were recommended by your rheumatologist, so those may suffice for your next visit.     If you have lab work done today you will be contacted with your lab results within the next 2 weeks.  If you have not heard from [place] then please contact us. The fastest way to get your results is to register for My Chart.   IF you received an x-ray today, you will receive an invoice from [facility]. Please contact [facility] at [phone] with questions or concerns regarding your invoice.   IF you received labwork today, you will receive an invoice from [facility]. Please contact [facility] at [phone] with questions or concerns regarding your invoice.   Our billing staff will not be able to assist you with questions regarding bills from these companies.  You will be contacted with the lab results as soon as they are available. The fastest way to get your results is to activate your My Chart account. Instructions are located on the last page of this paperwork. If you have not heard from [place] regarding the results in 2 weeks, please contact this office.

## 2020-05-23 NOTE — Progress Notes (Signed)
Subjective:  Patient ID: Jeremy Hendricks, male    DOB: Oct 26, 1955  Age: 64 y.o. MRN: [id number]  CC:  Chief Complaint  Patient presents with  . testorone test and labs    f/u     HPI Jeremy Hendricks presents for   Hypogonadism Currently using topical testosterone 30 mg per actuation, 1 pump per day. Due for updated labs.  Most recent testosterone level in July 2020 in the 600s. Working well. No side effects with meds.   Lab Results  Component Value Date   PSA1 1.4 03/12/2019   PSA1 1.8 04/15/2018   PSA1 1.6 08/15/2017   PSA 0.9 03/29/2016    Lab Results  Component Value Date   WBC 9.3 04/22/2020   HGB 16.1 04/22/2020   HCT 49.1 04/22/2020   MCV 97.0 04/22/2020   PLT 229 04/22/2020   Lab Results  Component Value Date   TESTOSTERONE 666 03/12/2019   Hyperlipidemia: Currently taking Crestor 40 mg daily.  He is followed by cardiology with history of CAD, chronic diastolic CHF, treated with furosemide 40 mg, atenolol, statin.  Dr. Angelena Form. Cardiology eval in February.  Not fasting - ate few hrs.  No new side effects with meds.   Lab Results  Component Value Date   CHOL 92 (L) 04/08/2019   HDL 31 (L) 04/08/2019   LDLCALC 46 04/08/2019   TRIG 76 04/08/2019   CHOLHDL 3.0 04/08/2019   Lab Results  Component Value Date   ALT 14 04/22/2020   AST 16 04/22/2020   ALKPHOS 64 03/12/2019   BILITOT 0.4 04/22/2020   Hyperglycemia/prediabetes: Lab Results  Component Value Date   HGBA1C 5.8 (A) 08/21/2019   Wt Readings from Last 3 Encounters:  05/23/20 186 lb 12.8 oz (84.7 kg)  03/10/20 188 lb 12.8 oz (85.6 kg)  01/22/20 183 lb 12.8 oz (83.4 kg)   Preexposure prophylaxis for HIV: Takes Truvada 200/300 mg daily. Multiple new partners -  unprotected intercourse since last sti screening.   Lab Results  Component Value Date   CREATININE 1.42 (H) 04/22/2020  STI screening negative in July 2020 with negative GC/chlamydia at that time, nonreactive HIV and RPR.    Chronic renal insufficiency Stage IIIa. Last creatinine of 1.42 last month with rheumatology.  1 month follow-up testing recommended.  Range of 1.19-1.48 over the past 1 year.(EGFR 50-63).  Elevated monocytes also noted on recent lab.  Stopped drinking 3 weeks ago.  Lab Results  Component Value Date   WBC 9.3 04/22/2020   HGB 16.1 04/22/2020   HCT 49.1 04/22/2020   MCV 97.0 04/22/2020   PLT 229 04/22/2020      History Patient Active Problem List   Diagnosis Date Noted  . Skin fissures 10/09/2018  . Skin infection 10/09/2018  . Chronic diastolic CHF (congestive heart failure) ([place]) 09/23/2018  . CKD (chronic kidney disease) stage 2, GFR 60-89 ml/min 09/23/2018  . History of ETT   . Gout   . DJD (degenerative joint disease)   . Coronary artery disease   . Atrial fibrillation ([place])   . 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 HLD (hyperlipidemia) 06/11/2016  . Hypogonadism in male 09/09/2015  . INSOMNIA 09/19/2010  . SHORTNESS OF BREATH 09/19/2010  . CHEST PAIN-PRECORDIAL 08/23/2010  . CAD, ARTERY BYPASS GRAFT 10/26/2009  . HYPERLIPIDEMIA 10/24/2009  . DEGENERATIVE JOINT DISEASE 10/24/2009  . ANGINA, HX OF 10/24/2009   Past Medical History:  Diagnosis Date  . Atrial fibrillation ([place])    postoperative  .  Chronic diastolic CHF (congestive heart failure) ([place])   . CKD (chronic kidney disease), stage II   . Coronary artery disease    a. s/p CABG 2002. b. s/p redo 2012.  Marland Kitchen DJD (degenerative joint disease)   . Gout   . History of ETT    a. ETT 6/16:  normal   Past Surgical History:  Procedure Laterality Date  . CORONARY ARTERY BYPASS GRAFT  2002  . CORONARY ARTERY BYPASS GRAFT  08-2010   L-LAD remained from original CABG; new grafts incl L radial- PDA + RIMA-RI  . VASECTOMY     No Known Allergies Prior to Admission medications   Medication Sig Start Date End Date Taking? Authorizing Provider  allopurinol (ZYLOPRIM) 300 MG tablet Take 1.5 tablets (450 mg total) by mouth  daily. 03/10/20  Yes Bo Merino, MD  ASPIRIN 81 PO Take by mouth daily.   Yes [provider]  atenolol (TENORMIN) 25 MG tablet TAKE 1 TABLET BY MOUTH EVERY DAY 03/15/20  Yes Dunn, Dayna N, PA-C  colchicine 0.6 MG tablet TAKE 1 TABLET BY MOUTH EVERY DAY 04/19/20  Yes Deveshwar, Abel Presto, MD  emtricitabine-tenofovir (TRUVADA) 200-300 MG tablet TAKE ONE TABLET BY MOUTH ONCE DAILY WITH OR WITHOUT FOOD. STORE IN ORIGINAL CONTAINER AT ROOM TEMPERATURE. 05/16/20  Yes Wendie Agreste, MD  furosemide (LASIX) 40 MG tablet TAKE 1 TABLET BY MOUTH DAILY *TAKE ADDITIONAL 1/2 TAB AS NEEDED FOR SWELLING* 03/14/20  Yes Burnell Blanks, MD  Multiple Vitamin (MULTIVITAMIN) tablet Take 1 tablet by mouth daily.     Yes [provider]  nitroGLYCERIN (NITROSTAT) 0.4 MG SL tablet Place 0.4 mg under the tongue every 5 (five) minutes as needed for chest pain. Reported on 09/21/2015   Yes [provider]  rosuvastatin (CRESTOR) 40 MG tablet Take 1 tablet (40 mg total) by mouth daily. 09/23/19  Yes Imogene Burn, PA-C  Testosterone 30 MG/ACT SOLN APPLY 1 PUMP EVERY DAY *NEED APPT/LABS* 04/26/20  Yes Wendie Agreste, MD   Social History   Socioeconomic History  . Marital status: Single    Spouse name: Not on file  . Number of children: 2  . Years of education: Not on file  . Highest education level: Not on file  Occupational History  . Occupation: Realtor  Tobacco Use  . Smoking status: Former Smoker    Quit date: 08/14/1999    Years since quitting: 20.7  . Smokeless tobacco: Never Used  Vaping Use  . Vaping Use: Never used  Substance and Sexual Activity  . Alcohol use: Yes    Comment: 1oz-2oz daily   . Drug use: No  . Sexual activity: Yes  Other Topics Concern  . Not on file  Social History Narrative   Single   Education: [employer]   Exercise: Yes   Social Determinants of Health   Financial Resource Strain:   . Difficulty of Paying Living Expenses: Not on file  Food  Insecurity:   . Worried About Charity fundraiser in the Last Year: Not on file  . Ran Out of Food in the Last Year: Not on file  Transportation Needs:   . Lack of Transportation (Medical): Not on file  . Lack of Transportation (Non-Medical): Not on file  Physical Activity:   . Days of Exercise per Week: Not on file  . Minutes of Exercise per Session: Not on file  Stress:   . Feeling of Stress : Not on file  Social Connections:   .  Frequency of Communication with Friends and Family: Not on file  . Frequency of Social Gatherings with Friends and Family: Not on file  . Attends Religious Services: Not on file  . Active Member of Clubs or Organizations: Not on file  . Attends Archivist Meetings: Not on file  . Marital Status: Not on file  Intimate Partner Violence:   . Fear of Current or Ex-Partner: Not on file  . Emotionally Abused: Not on file  . Physically Abused: Not on file  . Sexually Abused: Not on file    Review of Systems  Constitutional: Negative for fatigue and unexpected weight change.  Eyes: Negative for visual disturbance.  Respiratory: Negative for cough, chest tightness and shortness of breath.   Cardiovascular: Negative for chest pain, palpitations and leg swelling.  Gastrointestinal: Negative for abdominal pain and blood in stool.  Neurological: Negative for dizziness, light-headedness and headaches.   No new symptoms.   Objective:   Vitals:   05/23/20 1125  BP: 112/62  Pulse: 60  Temp: 97.6 F (36.4 C)  TempSrc: Temporal  SpO2: 94%  Weight: 186 lb 12.8 oz (84.7 kg)  Height: 5' 7" (1.702 m)     Physical Exam Vitals reviewed.  Constitutional:      Appearance: He is well-developed.  HENT:     Head: Normocephalic and atraumatic.  Eyes:     Pupils: Pupils are equal, round, and reactive to light.  Neck:     Vascular: No carotid bruit or JVD.  Cardiovascular:     Rate and Rhythm: Normal rate and regular rhythm.     Heart sounds: Normal  heart sounds. No murmur heard.   Pulmonary:     Effort: Pulmonary effort is normal.     Breath sounds: Normal breath sounds. No rales.  Skin:    General: Skin is warm and dry.  Neurological:     Mental Status: He is alert and oriented to person, place, and time.     34 minutes spent during visit, greater than 50% counseling and assimilation of information, chart review, and discussion of plan.   Assessment & Plan:  Jeremy Hendricks is a 64 y.o. male . Stage 3a chronic kidney disease ([place]) - Plan: Comprehensive metabolic panel  - check renal function. Avoid nsaids, maintain appropriate hydration - balance with furosemide need for CHF.   Monocytosis  - repeat CBC.   Hypogonadism in male - Plan: CBC, PSA Medication monitoring encounter - Plan: Comprehensive metabolic panel, Testosterone, PSA  - stable sx control. Check monitoring labs. continue same dose.   Gout, unspecified cause, unspecified chronicity, unspecified site - Plan: Uric Acid  - check uric acid off alcohol  (commended on cessation). Continue follow up with rheumatology as planned.   Hypertriglyceridemia - Plan: Lipid panel  - continue crestor, check labs.   On pre-exposure prophylaxis for HIV - Plan: Comprehensive metabolic panel, HIV Antibody (routine testing w rflx), RPR Routine screening for STI (sexually transmitted infection) - Plan: HIV Antibody (routine testing w rflx), RPR  - check reanl funcion wuth use of TRuvada, sti screening. If new partners, consider every 3 month screening, minimum every 6 months.   No orders of the defined types were placed in this encounter.  Patient Instructions    No change in medications at this time.  I will check some screening labs.  Let me know if there are questions.  The labs are checked today are also ones that were recommended by your rheumatologist, so  those may suffice for your next visit.     If you have lab work done today you will be contacted with your lab  results within the next 2 weeks.  If you have not heard from [place] then please contact us. The fastest way to get your results is to register for My Chart.   IF you received an x-ray today, you will receive an invoice from [facility]. Please contact [facility] at [phone] with questions or concerns regarding your invoice.   IF you received labwork today, you will receive an invoice from [facility]. Please contact [facility] at [phone] with questions or concerns regarding your invoice.   Our billing staff will not be able to assist you with questions regarding bills from these companies.  You will be contacted with the lab results as soon as they are available. The fastest way to get your results is to activate your My Chart account. Instructions are located on the last page of this paperwork. If you have not heard from [place] regarding the results in 2 weeks, please contact this office.          Signed, [name], MD [facility]

## 2020-05-24 LAB — COMPREHENSIVE METABOLIC PANEL
ALT: 23 IU/L (ref 0–44)
AST: 25 IU/L (ref 0–40)
Albumin/Globulin Ratio: 2.6 — ABNORMAL HIGH (ref 1.2–2.2)
Albumin: 4.7 g/dL (ref 3.8–4.8)
Alkaline Phosphatase: 69 IU/L (ref 44–121)
BUN/Creatinine Ratio: 13 (ref 10–24)
BUN: 17 mg/dL (ref 8–27)
Bilirubin Total: 0.4 mg/dL (ref 0.0–1.2)
CO2: 26 mmol/L (ref 20–29)
Calcium: 9.5 mg/dL (ref 8.6–10.2)
Chloride: 99 mmol/L (ref 96–106)
Creatinine, Ser: 1.35 mg/dL — ABNORMAL HIGH (ref 0.76–1.27)
GFR calc Af Amer: 64 mL/min/{1.73_m2} (ref >59)
GFR calc non Af Amer: 55 mL/min/{1.73_m2} — ABNORMAL LOW (ref >59)
Globulin, Total: 1.8 g/dL (ref 1.5–4.5)
Glucose: 90 mg/dL (ref 65–99)
Potassium: 4.7 mmol/L (ref 3.5–5.2)
Sodium: 139 mmol/L (ref 134–144)
Total Protein: 6.5 g/dL (ref 6.0–8.5)

## 2020-05-24 LAB — LIPID PANEL
Chol/HDL Ratio: 3.7 ratio (ref 0.0–5.0)
Cholesterol, Total: 81 mg/dL — ABNORMAL LOW (ref 100–199)
HDL: 22 mg/dL — ABNORMAL LOW (ref >39)
LDL Chol Calc (NIH): 34 mg/dL (ref 0–99)
Triglycerides: 144 mg/dL (ref 0–149)
VLDL Cholesterol Cal: 25 mg/dL (ref 5–40)

## 2020-05-24 LAB — CBC
Hematocrit: 46.2 % (ref 37.5–51.0)
Hemoglobin: 15.8 g/dL (ref 13.0–17.7)
MCH: 32.3 pg (ref 26.6–33.0)
MCHC: 34.2 g/dL (ref 31.5–35.7)
MCV: 95 fL (ref 79–97)
Platelets: 210 10*3/uL (ref 150–450)
RBC: 4.89 x10E6/uL (ref 4.14–5.80)
RDW: 12.9 % (ref 11.6–15.4)
WBC: 7.7 10*3/uL (ref 3.4–10.8)

## 2020-05-24 LAB — TESTOSTERONE: Testosterone: 533 ng/dL (ref 264–916)

## 2020-05-24 LAB — HIV ANTIBODY (ROUTINE TESTING W REFLEX): HIV Screen 4th Generation wRfx: NONREACTIVE

## 2020-05-24 LAB — RPR: RPR Ser Ql: NONREACTIVE

## 2020-05-24 LAB — PSA: Prostate Specific Ag, Serum: 1.5 ng/mL (ref 0.0–4.0)

## 2020-05-24 LAB — URIC ACID: Uric Acid: 6 mg/dL (ref 3.8–8.4)

## 2020-05-26 NOTE — Progress Notes (Signed)
Office Visit Note  Patient: Jeremy Hendricks             Date of Birth: 24-Jun-1956           MRN: [id number]             PCP: Wendie Agreste, MD Referring: Wendie Agreste, MD Visit Date: 06/09/2020 Occupation: @GUAROCC @  Subjective:  Medication monitoring.   History of Present Illness: Jeremy Hendricks is a 64 y.o. male with history of gouty arthropathy and osteoarthritis.  He states he has done quite well since the last visit.  He has reduced allopurinol to 300 mg 1 tablet p.o. daily.  He has been using colchicine only on as needed basis.  He quit drinking alcohol about a month ago.  He is also done dietary modifications.  He has had few twinges of joint discomfort for which he took colchicine on as needed basis.  He continues to have some stiffness in his joints after working out at [employer].  Activities of Daily Living:  Patient reports morning stiffness for 15-20 minutes.   Patient Denies nocturnal pain.  Difficulty dressing/grooming: Denies Difficulty climbing stairs: Denies Difficulty getting out of chair: Denies Difficulty using hands for taps, buttons, cutlery, and/or writing: Denies  Review of Systems  Constitutional: Negative for fatigue.  HENT: Negative for mouth sores, mouth dryness and nose dryness.   Eyes: Negative for pain, itching, visual disturbance and dryness.  Respiratory: Negative for cough, hemoptysis, shortness of breath and difficulty breathing.   Cardiovascular: Negative for chest pain, palpitations and swelling in legs/feet.  Gastrointestinal: Negative for abdominal pain, blood in stool, constipation and diarrhea.  Endocrine: Negative for increased urination.  Genitourinary: Negative for painful urination.  Musculoskeletal: Positive for morning stiffness. Negative for arthralgias, joint pain, joint swelling, myalgias, muscle weakness, muscle tenderness and myalgias.  Skin: Negative for color change, rash and redness.  Allergic/Immunologic: Negative for  susceptible to infections.  Neurological: Negative for dizziness, numbness, headaches, memory loss and weakness.  Hematological: Negative for swollen glands.  Psychiatric/Behavioral: Negative for confusion and sleep disturbance.    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 s/p CABG 2002. b. s/p redo 2012.   DJD (degenerative joint disease)    Gout    History of ETT    a. ETT 6/16:  normal    Family History  Problem Relation Age of Onset   Heart attack Father 81   Hypertension Father    Cancer Mother        Breast cancer  Cancer Other        family hx of   Coronary artery disease Other        family hx of   Hyperlipidemia Other        family hx of   Hypertension Paternal Grandfather    Healthy Son    Healthy Daughter    Stroke Neg Hx    Colon cancer Neg Hx    Past Surgical History:  Procedure Laterality Date   CORONARY ARTERY BYPASS GRAFT  2002   CORONARY ARTERY BYPASS GRAFT  08-2010   L-LAD remained from original CABG; new grafts incl L radial- PDA + RIMA-RI    VASECTOMY     Social History   Social History Narrative   Single   Education: College   Exercise: Yes   Immunization History  Administered Date(s) Administered   Hepatitis B 09/20/2000   Influenza Split 05/14/2015, 06/11/2016   Influenza,inj,Quad PF,6+ Mos 04/04/2017, 04/15/2018, 05/05/2019, 04/20/2020   Influenza-Unspecified 04/04/2017   PFIZER SARS-COV-2 Vaccination 10/24/2019, 11/17/2019   Tdap 02/11/2017   Zoster 08/13/2013     Objective: Vital Signs: BP 115/66 (BP Location: Left Arm, Patient Position: Sitting, Cuff Size: Small)    Pulse 64    Ht 5\' 8"  (1.727 m)    Wt 184 lb (83.5 kg)    BMI 27.98 kg/m    Physical Exam Vitals and nursing note reviewed.  Constitutional:      Appearance: He is well-developed.  HENT:     Head: Normocephalic and atraumatic.  Eyes:     Conjunctiva/sclera: Conjunctivae normal.     Pupils: Pupils are equal, round, and reactive to light.  Cardiovascular:     Rate and Rhythm: Normal rate and regular rhythm.     Heart sounds: Normal heart sounds.  Pulmonary:     Effort: Pulmonary effort is normal.     Breath sounds: Normal breath sounds.  Abdominal:     General: Bowel sounds are normal.     Palpations: Abdomen is soft.  Musculoskeletal:     Cervical back: Normal range of motion and neck supple.  Skin:    General: Skin is warm and dry.     Capillary Refill: Capillary refill takes less than 2 seconds.  Neurological:     Mental Status: He is alert and oriented to person, place, and time.  Psychiatric:        Behavior: Behavior normal.      Musculoskeletal Exam: The spine thoracic and lumbar spine were in good range of motion.  Shoulder joints, elbow joints, wrist joints with good range of motion.  He has bilateral PIP and DIP thickening.  Hip joints, knee joints, ankles with good range of motion.  There is no tenderness over ankles or MTPs.  CDAI Exam: CDAI Score: -- Patient Global: --; Provider Global: -- Swollen: --; Tender:  -- Joint Exam 06/09/2020   No joint exam has been documented for this visit   There is currently no information documented on the homunculus. Go to the Rheumatology activity and complete the homunculus joint exam.  Investigation: No additional findings.  Imaging: No results found.  Recent Labs: Lab Results  Component Value Date   WBC 7.7 05/23/2020   HGB 15.8 05/23/2020   PLT 210 05/23/2020   NA 139 05/23/2020   K 4.7 05/23/2020   CL 99 05/23/2020   CO2 26 05/23/2020   GLUCOSE 90 05/23/2020   BUN 17 05/23/2020   CREATININE 1.35 (H) 05/23/2020   BILITOT 0.4  05/23/2020   ALKPHOS 69 05/23/2020   AST 25 05/23/2020   ALT 23 05/23/2020   PROT 6.5 05/23/2020   ALBUMIN 4.7 05/23/2020   CALCIUM 9.5 05/23/2020   GFRAA 64 05/23/2020  April 22, 2020 uric acid 5.3, May 23, 2020 uric acid 6.0  Speciality Comments: No specialty comments available.  Procedures:  No procedures performed Allergies: Patient has no known allergies.   Assessment / Plan:     Visit Diagnoses: Idiopathic chronic gout of multiple sites without tophus - colchicine 1 tablet p.o. daily and allopurinol 300 mg , 1tablet p.o. daily. uric acid: 05/23/2020 6.0.  Patient states he has been taking allopurinol only 1 tablet a day since the last visit.  He takes colchicine on as needed basis only.  He has mild twinges for which he took colchicine on as needed basis.  He denies any severe flare.  Primary osteoarthritis of both hands-he has bilateral DIP thickening.  Primary osteoarthritis of both feet-he does not have much discomfort currently except for occasional stiffness.  DDD (degenerative disc disease), lumbar-he had good range of motion of his lumbar spine.  CKD (chronic kidney disease) stage 2, GFR 60-89 ml/min-labs are stable.  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   History of hyperlipidemia - he is on Crestor.  History of atrial fibrillation  Other insomnia  Educated but COVID-19 virus infection-he is fully immunized against COVID-19.  He has been advised to get a booster.  Use of mask, social distancing and hand hygiene was discussed.  Orders: Orders Placed This Encounter  Procedures   CBC with Differential/Platelet   COMPLETE METABOLIC PANEL WITH GFR   Uric acid   No orders of the defined types were placed in this encounter.    Follow-Up Instructions: Return in about 6 months (around 12/08/2020) for Gout, Osteoarthritis.  Advised him to get labs 2 weeks prior to his future appointment.   Bo Merino, MD  Note - This record has been created using Editor, commissioning.  Chart creation errors have been sought, but may not always  have been located. Such creation errors do not reflect on  the standard of medical care.

## 2020-05-29 ENCOUNTER — Other Ambulatory Visit: Payer: Self-pay | Admitting: Family Medicine

## 2020-05-29 DIAGNOSIS — R7989 Other specified abnormal findings of blood chemistry: Secondary | ICD-10-CM

## 2020-05-29 DIAGNOSIS — E291 Testicular hypofunction: Secondary | ICD-10-CM

## 2020-05-31 ENCOUNTER — Other Ambulatory Visit: Payer: Self-pay | Admitting: Family Medicine

## 2020-05-31 DIAGNOSIS — M109 Gout, unspecified: Secondary | ICD-10-CM

## 2020-06-09 ENCOUNTER — Ambulatory Visit: Payer: BC Managed Care – PPO | Admitting: Rheumatology

## 2020-06-09 ENCOUNTER — Encounter: Payer: Self-pay | Admitting: Rheumatology

## 2020-06-09 ENCOUNTER — Other Ambulatory Visit: Payer: Self-pay

## 2020-06-09 VITALS — BP 115/66 | HR 64 | Ht 68.0 in | Wt 184.0 lb

## 2020-06-09 DIAGNOSIS — M5136 Other intervertebral disc degeneration, lumbar region: Secondary | ICD-10-CM

## 2020-06-09 DIAGNOSIS — Z8639 Personal history of other endocrine, nutritional and metabolic disease: Secondary | ICD-10-CM

## 2020-06-09 DIAGNOSIS — M19071 Primary osteoarthritis, right ankle and foot: Secondary | ICD-10-CM | POA: Diagnosis not present

## 2020-06-09 DIAGNOSIS — I5032 Chronic diastolic (congestive) heart failure: Secondary | ICD-10-CM

## 2020-06-09 DIAGNOSIS — M19041 Primary osteoarthritis, right hand: Secondary | ICD-10-CM | POA: Diagnosis not present

## 2020-06-09 DIAGNOSIS — M79641 Pain in right hand: Secondary | ICD-10-CM

## 2020-06-09 DIAGNOSIS — M1A09X0 Idiopathic chronic gout, multiple sites, without tophus (tophi): Secondary | ICD-10-CM

## 2020-06-09 DIAGNOSIS — I25810 Atherosclerosis of coronary artery bypass graft(s) without angina pectoris: Secondary | ICD-10-CM

## 2020-06-09 DIAGNOSIS — G4709 Other insomnia: Secondary | ICD-10-CM

## 2020-06-09 DIAGNOSIS — N182 Chronic kidney disease, stage 2 (mild): Secondary | ICD-10-CM

## 2020-06-09 DIAGNOSIS — Z7189 Other specified counseling: Secondary | ICD-10-CM

## 2020-06-09 DIAGNOSIS — M79642 Pain in left hand: Secondary | ICD-10-CM

## 2020-06-09 DIAGNOSIS — Z8679 Personal history of other diseases of the circulatory system: Secondary | ICD-10-CM

## 2020-06-09 DIAGNOSIS — M19042 Primary osteoarthritis, left hand: Secondary | ICD-10-CM

## 2020-06-09 DIAGNOSIS — M19072 Primary osteoarthritis, left ankle and foot: Secondary | ICD-10-CM

## 2020-06-13 ENCOUNTER — Encounter: Payer: Self-pay | Admitting: Family Medicine

## 2020-06-16 ENCOUNTER — Encounter: Payer: Self-pay | Admitting: *Deleted

## 2020-06-16 NOTE — Telephone Encounter (Signed)
Pt needs PA for Testosterone if you were not already aware. I can inform pt when you have completed thank you!

## 2020-06-27 ENCOUNTER — Other Ambulatory Visit: Payer: Self-pay | Admitting: Rheumatology

## 2020-06-27 NOTE — Telephone Encounter (Signed)
Please schedule patient for a follow up visit. Patient due April 2022. Thanks!  °

## 2020-06-27 NOTE — Telephone Encounter (Signed)
Last Visit: 06/09/2020 Next Visit: due April 2022. Message sent to the front to schedule.  Labs: 05/23/2020 Creat. 1.35 GFR 55 Albumin/Globulin Ratio 2.6, Uric Acid 6.0   Current Dose per office note 06/09/2020 : allopurinol 300 mg , 1tablet p.o. daily Per lab note on 04/22/2020 He recently increased his dose of allopurinol to 450 mg daily DX: Idiopathic chronic gout of multiple sites without tophus   Okay to refill Allopurinol and at which dose?

## 2020-06-27 NOTE — Telephone Encounter (Signed)
[facility] for patient to call and schedule 6 month follow-up appointment in April 2022.

## 2020-07-14 ENCOUNTER — Other Ambulatory Visit: Payer: Self-pay | Admitting: Family Medicine

## 2020-07-14 ENCOUNTER — Other Ambulatory Visit: Payer: Self-pay

## 2020-07-14 ENCOUNTER — Ambulatory Visit: Payer: BC Managed Care – PPO | Admitting: Family Medicine

## 2020-07-14 DIAGNOSIS — Z113 Encounter for screening for infections with a predominantly sexual mode of transmission: Secondary | ICD-10-CM

## 2020-07-14 NOTE — Telephone Encounter (Signed)
°  Notes to clinic appt today with Dr Carlota Raspberry, already had a one month fill.

## 2020-07-15 ENCOUNTER — Ambulatory Visit: Payer: BC Managed Care – PPO | Admitting: Registered Nurse

## 2020-07-15 ENCOUNTER — Other Ambulatory Visit: Payer: Self-pay

## 2020-07-15 ENCOUNTER — Encounter: Payer: Self-pay | Admitting: Registered Nurse

## 2020-07-15 VITALS — BP 123/66 | HR 66 | Temp 98.0°F | Resp 18 | Ht 68.0 in | Wt 183.0 lb

## 2020-07-15 DIAGNOSIS — L989 Disorder of the skin and subcutaneous tissue, unspecified: Secondary | ICD-10-CM | POA: Diagnosis not present

## 2020-07-15 NOTE — Progress Notes (Signed)
Acute Office Visit  Subjective:    Patient ID: Jeremy Hendricks, male    DOB: [birth year], 64 y.o.   MRN: [id number]  Chief Complaint  Patient presents with   Mass    Patient states he is here because he has noticed a lump on the back of his neck  last week. Per patient it only hurts if he touch it,    HPI Patient is in today for bump on neck  Onset two weeks ago. Unsure if it was there before. Thought it may be pimple or ingrown hair but it has not resolved. Concern given fam hx of skin ca and a lot of sun exposure in the past. There is a small area of hyperpigmentation on this but nothing that has changed or evolved quickly. Feels very superficial and well defined. Does not have routine dermatologist that he sees.   No other concerns  Past Medical History:  Diagnosis Date   Atrial fibrillation ([place])    postoperative   Chronic diastolic CHF (congestive heart failure) ([place])    CKD (chronic kidney disease), stage II    Coronary artery disease    a. s/p CABG 2002. b. s/p redo 2012.   DJD (degenerative joint disease)    Gout    History of ETT    a. ETT 6/16:  normal    Past Surgical History:  Procedure Laterality Date   CORONARY ARTERY BYPASS GRAFT  2002   CORONARY ARTERY BYPASS GRAFT  08-2010   L-LAD remained from original CABG; new grafts incl L radial- PDA + RIMA-RI   VASECTOMY      Family History  Problem Relation Age of Onset   Heart attack Father 79   Hypertension Father    Cancer Mother        Breast cancer   Cancer Other        family hx of   Coronary artery disease Other        family hx of   Hyperlipidemia Other        family hx of   Hypertension Paternal Grandfather    Healthy Son    Healthy Daughter    Stroke Neg Hx    Colon cancer Neg Hx     Social History   Socioeconomic History   Marital status: Single    Spouse name: Not on file   Number of children: 2   Years of education: Not on file   Highest education level:  Not on file  Occupational History   Occupation: Realtor  Tobacco Use   Smoking status: Former Smoker    Quit date: 08/14/1999    Years since quitting: 20.9   Smokeless tobacco: Never Used  Scientific laboratory technician Use: Never used  Substance and Sexual Activity   Alcohol use: Yes    Comment: 1oz-2oz daily    Drug use: No   Sexual activity: Yes  Other Topics Concern   Not on file  Social History Narrative   Single   Education: Secretary/administrator   Exercise: Yes   Social Determinants of Health   Financial Resource Strain:    Difficulty of Paying Living Expenses: Not on file  Food Insecurity:    Worried About Charity fundraiser in the Last Year: Not on file   [employer] of Food in the Last Year: Not on file  Transportation Needs:    Lack of Transportation (Medical): Not on file   Lack of Transportation (Non-Medical): Not on  file  Physical Activity:    Days of Exercise per Week: Not on file   Minutes of Exercise per Session: Not on file  Stress:    Feeling of Stress : Not on file  Social Connections:    Frequency of Communication with Friends and Family: Not on file   Frequency of Social Gatherings with Friends and Family: Not on file   Attends Religious Services: Not on file   Active Member of Clubs or Organizations: Not on file   Attends Archivist Meetings: Not on file   Marital Status: Not on file  Intimate Partner Violence:    Fear of Current or Ex-Partner: Not on file   Emotionally Abused: Not on file   Physically Abused: Not on file   Sexually Abused: Not on file    Outpatient Medications Prior to Visit  Medication Sig Dispense Refill   allopurinol (ZYLOPRIM) 300 MG tablet Take 1.5 tablets (450 mg total) by mouth daily. 135 tablet 0   atenolol (TENORMIN) 25 MG tablet TAKE 1 TABLET BY MOUTH EVERY DAY 90 tablet 1   colchicine 0.6 MG tablet TAKE 1 TABLET BY MOUTH EVERY DAY (Patient taking differently: as needed. ) 90 tablet 0    emtricitabine-tenofovir (TRUVADA) 200-300 MG tablet Take 1 tablet by mouth daily. 90 tablet 2   furosemide (LASIX) 40 MG tablet TAKE 1 TABLET BY MOUTH DAILY *TAKE ADDITIONAL 1/2 TAB AS NEEDED FOR SWELLING* 135 tablet 1   Multiple Vitamin (MULTIVITAMIN) tablet Take 1 tablet by mouth daily.       nitroGLYCERIN (NITROSTAT) 0.4 MG SL tablet Place 0.4 mg under the tongue every 5 (five) minutes as needed for chest pain. Reported on 09/21/2015     rosuvastatin (CRESTOR) 40 MG tablet Take 1 tablet (40 mg total) by mouth daily. 90 tablet 3   Testosterone 30 MG/ACT SOLN APPLY 1 PUMP EVERY DAY 90 mL 3   No facility-administered medications prior to visit.    No Known Allergies  Review of Systems  Constitutional: Negative.   HENT: Negative.   Eyes: Negative.   Respiratory: Negative.   Cardiovascular: Negative.   Gastrointestinal: Negative.   Genitourinary: Negative.   Musculoskeletal: Negative.   Skin: Negative.   Neurological: Negative.   Psychiatric/Behavioral: Negative.        Objective:    Physical Exam Vitals and nursing note reviewed.  Constitutional:      General: He is not in acute distress.    Appearance: Normal appearance. He is not ill-appearing, toxic-appearing or diaphoretic.  Skin:    General: Skin is warm and dry.     Coloration: Skin is not ashen, cyanotic, jaundiced, mottled, pale or sallow.     Findings: Lesion (L anterior of trapezius. firm well circumscribed bump. about 0.5cm in diameter) present.     Nails: There is no clubbing.  Neurological:     General: No focal deficit present.     Mental Status: He is alert and oriented to person, place, and time. Mental status is at baseline.  Psychiatric:        Mood and Affect: Mood normal.        Behavior: Behavior normal.        Thought Content: Thought content normal.        Judgment: Judgment normal.     BP 123/66    Pulse 66    Temp 98 F (36.7 C) (Temporal)    Resp 18    Ht 5\' 8"  (1.727 m)  Wt 183 lb (83  kg)    SpO2 98%    BMI 27.83 kg/m  Wt Readings from Last 3 Encounters:  07/15/20 183 lb (83 kg)  06/09/20 184 lb (83.5 kg)  05/23/20 186 lb 12.8 oz (84.7 kg)    There are no preventive care reminders to display for this patient.  There are no preventive care reminders to display for this patient.   Lab Results  Component Value Date   TSH 2.910 08/20/2017   Lab Results  Component Value Date   WBC 7.7 05/23/2020   HGB 15.8 05/23/2020   HCT 46.2 05/23/2020   MCV 95 05/23/2020   PLT 210 05/23/2020   Lab Results  Component Value Date   NA 139 05/23/2020   K 4.7 05/23/2020   CO2 26 05/23/2020   GLUCOSE 90 05/23/2020   BUN 17 05/23/2020   CREATININE 1.35 (H) 05/23/2020   BILITOT 0.4 05/23/2020   ALKPHOS 69 05/23/2020   AST 25 05/23/2020   ALT 23 05/23/2020   PROT 6.5 05/23/2020   ALBUMIN 4.7 05/23/2020   CALCIUM 9.5 05/23/2020   GFR 61.73 10/03/2010   Lab Results  Component Value Date   CHOL 81 (L) 05/23/2020   Lab Results  Component Value Date   HDL 22 (L) 05/23/2020   Lab Results  Component Value Date   LDLCALC 34 05/23/2020   Lab Results  Component Value Date   TRIG 144 05/23/2020   Lab Results  Component Value Date   CHOLHDL 3.7 05/23/2020   Lab Results  Component Value Date   HGBA1C 5.8 (A) 08/21/2019       Assessment & Plan:   Problem List Items Addressed This Visit    None    Visit Diagnoses    Skin lesion    -  Primary   Relevant Orders   Ambulatory referral to Dermatology       No orders of the defined types were placed in this encounter.  PLAN  Appears as likely benign lesion. With firm pressure scant curd like drainage and some blood. Feel this is likely small cyst  Area of hyperpigmentation shows no concerns for melanoma. But given pt history of much unprotected sun exposure and strong fam hx of skin ca, will refer to derm  Patient encouraged to call clinic with any questions, comments, or concerns.  Maximiano Coss, NP

## 2020-07-15 NOTE — Patient Instructions (Signed)
° ° ° °  If you have lab work done today you will be contacted with your lab results within the next 2 weeks.  If you have not heard from us then please contact us. The fastest way to get your results is to register for My Chart. ° ° °IF you received an x-ray today, you will receive an invoice from [facility]. Please contact [facility] at [phone] with questions or concerns regarding your invoice.  ° °IF you received labwork today, you will receive an invoice from [facility]. Please contact [facility] at [phone] with questions or concerns regarding your invoice.  ° °Our billing staff will not be able to assist you with questions regarding bills from these companies. ° °You will be contacted with the lab results as soon as they are available. The fastest way to get your results is to activate your My Chart account. Instructions are located on the last page of this paperwork. If you have not heard from us regarding the results in 2 weeks, please contact this office. °  ° ° ° °

## 2020-07-22 ENCOUNTER — Other Ambulatory Visit: Payer: Self-pay | Admitting: Family Medicine

## 2020-07-22 DIAGNOSIS — Z113 Encounter for screening for infections with a predominantly sexual mode of transmission: Secondary | ICD-10-CM

## 2020-07-22 NOTE — Telephone Encounter (Signed)
Requested medication (s) are due for refill today:   Provider to determine  Requested medication (s) are on the active medication list:   Yes  Future visit scheduled:   No   seen a wk ago   Last ordered: 05/23/2020 #90, 2 refills  No protocol assigned to this medication.   Requested Prescriptions  Pending Prescriptions Disp Refills   emtricitabine-tenofovir (TRUVADA) 200-300 MG tablet [Pharmacy Med Name: EMTRICITABINE/TENOF 200/300MG ] 30 tablet 0    Sig: TAKE ONE TABLET BY MOUTH ONCE DAILY WITH OR WITHOUT FOOD. STORE IN ORIGINAL CONTAINER AT ROOM TEMPERATURE.      Off-Protocol Failed - 07/22/2020 12:06 PM      Failed - Medication not assigned to a protocol, review manually.      Passed - Valid encounter within last 12 months    Recent Outpatient Visits           1 week ago Skin lesion   [facility], [place], NP   2 months ago Stage 3a chronic kidney disease [facility])   [facility], Ranell Patrick, MD   6 months ago Sprain of right ankle, unspecified ligament, initial encounter   [facility] at [facility], Fenton Malling, MD   9 months ago Gout, unspecified cause, unspecified chronicity, unspecified site   [facility] [place], [place]   10 months ago Bilateral foot pain   [facility], Ranell Patrick, MD

## 2020-07-28 ENCOUNTER — Other Ambulatory Visit: Payer: Self-pay | Admitting: Family Medicine

## 2020-07-28 DIAGNOSIS — Z113 Encounter for screening for infections with a predominantly sexual mode of transmission: Secondary | ICD-10-CM

## 2020-07-28 NOTE — Telephone Encounter (Signed)
Patient called to verify if this is where he wants the medication sent because on 05/23/20 #90/2 refills were [facility] locally. He says that [facility] never [facility] at [facility] that it is all mail order now and that he doesn't have any more left. I advised I will send this to the requested pharmacy, pharmacy profile updated per patient request.

## 2020-08-19 ENCOUNTER — Other Ambulatory Visit: Payer: Self-pay | Admitting: Cardiovascular Disease

## 2020-08-19 ENCOUNTER — Other Ambulatory Visit: Payer: Self-pay | Admitting: Family Medicine

## 2020-08-19 DIAGNOSIS — E291 Testicular hypofunction: Secondary | ICD-10-CM

## 2020-08-19 DIAGNOSIS — R7989 Other specified abnormal findings of blood chemistry: Secondary | ICD-10-CM

## 2020-09-03 ENCOUNTER — Other Ambulatory Visit: Payer: Self-pay | Admitting: Family Medicine

## 2020-09-03 ENCOUNTER — Other Ambulatory Visit: Payer: Self-pay | Admitting: Physician Assistant

## 2020-09-03 DIAGNOSIS — M109 Gout, unspecified: Secondary | ICD-10-CM

## 2020-09-26 ENCOUNTER — Other Ambulatory Visit: Payer: Self-pay | Admitting: Physician Assistant

## 2020-10-29 ENCOUNTER — Telehealth: Payer: BC Managed Care – PPO | Admitting: Orthopedic Surgery

## 2020-10-29 DIAGNOSIS — K0889 Other specified disorders of teeth and supporting structures: Secondary | ICD-10-CM | POA: Diagnosis not present

## 2020-10-29 MED ORDER — AMOXICILLIN 500 MG PO CAPS: 500.0000 mg | ORAL_CAPSULE | Freq: Three times a day (TID) | ORAL | 0 refills | Status: DC

## 2020-10-29 MED ORDER — NAPROXEN 500 MG PO TABS: 500.0000 mg | ORAL_TABLET | Freq: Two times a day (BID) | ORAL | 0 refills | Status: DC

## 2020-10-29 NOTE — Progress Notes (Signed)
E-Visit for Dental Pain  We are sorry that you are not feeling well.  Here is how we plan to help!  Based on what you have shared with me in the questionnaire, it sounds like you have a gum infection.  naprosyn 500mg  2 times per day for 7 days for discomfort and Amoxicillin 500mg  3 times per day for 10 days  It is imperative that you see a dentist within 10 days of this eVisit to determine the cause of the dental pain and be sure it is adequately treated  A toothache or tooth pain is caused when the nerve in the root of a tooth or surrounding a tooth is irritated. Dental (tooth) infection, decay, injury, or loss of a tooth are the most common causes of dental pain. Pain may also occur after an extraction (tooth is pulled out). Pain sometimes originates from other areas and radiates to the jaw, thus appearing to be tooth pain.Bacteria growing inside your mouth can contribute to gum disease and dental decay, both of which can cause pain. A toothache occurs from inflammation of the central portion of the tooth called pulp. The pulp contains nerve endings that are very sensitive to pain. Inflammation to the pulp or pulpitis may be caused by dental cavities, trauma, and infection.    HOME CARE:   For toothaches: . Over-the-counter pain medications such as acetaminophen or ibuprofen may be used. Take these as directed on the package while you arrange for a dental appointment. . Avoid very cold or hot foods, because they may make the pain worse. . You may get relief from biting on a cotton ball soaked in oil of cloves. You can get oil of cloves at most drug stores.  For jaw pain: .  Aspirin may be helpful for problems in the joint of the jaw in adults. . If pain happens every time you open your mouth widely, the temporomandibular joint (TMJ) may be the source of the pain. Yawning or taking a large bite of food may worsen the pain. An appointment with your doctor or dentist will help you find the  cause.     GET HELP RIGHT AWAY IF:  . You have a high fever or chills . If you have had a recent head or face injury and develop headache, light headedness, nausea, vomiting, or other symptoms that concern you after an injury to your face or mouth, you could have a more serious injury in addition to your dental injury. . A facial rash associated with a toothache: This condition may improve with medication. Contact your doctor for them to decide what is appropriate. . Any jaw pain occurring with chest pain: Although jaw pain is most commonly caused by dental disease, it is sometimes referred pain from other areas. People with heart disease, especially people who have had stents placed, people with diabetes, or those who have had heart surgery may have jaw pain as a symptom of heart attack or angina. If your jaw or tooth pain is associated with lightheadedness, sweating, or shortness of breath, you should see a doctor as soon as possible. . Trouble swallowing or excessive pain or bleeding from gums: If you have a history of a weakened immune system, diabetes, or steroid use, you may be more susceptible to infections. Infections can often be more severe and extensive or caused by unusual organisms. Dental and gum infections in people with these conditions may require more aggressive treatment. An abscess may need draining or IV antibiotics,  for example.  MAKE SURE YOU    Understand these instructions.  Will watch your condition.  Will get help right away if you are not doing well or get worse.  Thank you for choosing an e-visit. Your e-visit answers were reviewed by a board certified advanced clinical practitioner to complete your personal care plan. Depending upon the condition, your plan could have included both over the counter or prescription medications. Please review your pharmacy choice. Make sure the pharmacy is open so you can pick up prescription now. If there is a problem, you may  contact your provider through [employer] and have the prescription routed to another pharmacy. Your safety is important to [place]. If you have drug allergies check your prescription carefully.  For the next 24 hours you can use MyChart to ask questions about today's visit, request a non-urgent call back, or ask for a work or school excuse. You will get an email in the next two days asking about your experience. I hope that your e-visit has been valuable and will speed your recovery.  Greater than 5 minutes, yet less than 10 minutes of time have been spent researching, coordinating and implementing care for this patient today.

## 2020-10-31 NOTE — Progress Notes (Signed)
Cardiology Office Note    Date:  11/02/2020   ID:  Jeremy Hendricks, DOB Sep 01, 1955, MRN [id number]   PCP:  Wendie Agreste, MD   [place]  Cardiologist:  Lauree Chandler, MD  Advanced Practice Provider:  No care team member to display Electrophysiologist:  None   [id number]   Chief Complaint  Patient presents with  . Follow-up    History of Present Illness:  Jeremy Hendricks is a 65 y.o. male with history of CAD status post CABG in 2002 redo 2012, postop A. fib, chronic diastolic CHF.  Echo 2014 normal LVEF, normal GXT 01/2015 CKD last creatinine 1.23-03/2018.    I last saw the patient 09/23/18 and he was having some trouble with swelling. 2Decho 10/08/18 normal LVEF 34-19% with diastolic dysfunction.  Last saw the patient 09/2019 at which time he was doing well.  Patient comes in for yearly f/u. Denies chest pain, palpitations, dizziness or presyncope. Complains of some dyspnea and leg swelling and taking lasix 40 mg bid. 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 Goes to gym 4-5 days/week for 1 hr doing cardio/weights. Stopped drinking alcohol 3 months ago because of gout.   Past Medical History:  Diagnosis Date  . Atrial fibrillation ([place])    postoperative  . Chronic diastolic CHF (congestive heart failure) ([place])   . CKD (chronic kidney disease), stage II   . Coronary artery disease    a. s/p CABG 2002. b. s/p redo 2012.  Marland Kitchen DJD (degenerative joint disease)   . Gout   . History of ETT    a. ETT 6/16:  normal    Past Surgical History:  Procedure Laterality Date  . CORONARY ARTERY BYPASS GRAFT  2002  . CORONARY ARTERY BYPASS GRAFT  08-2010   L-LAD remained from original CABG; new grafts incl L radial- PDA + RIMA-RI  . VASECTOMY      Current Medications: Current Meds  Medication Sig  . allopurinol (ZYLOPRIM) 300 MG tablet Take 1.5 tablets (450 mg total) by mouth daily.  Marland Kitchen amoxicillin (AMOXIL) 500  MG capsule Take 1 capsule (500 mg total) by mouth 3 (three) times daily.  Marland Kitchen atenolol (TENORMIN) 25 MG tablet Take 1 tablet (25 mg total) by mouth daily. Please keep upcoming appt in March 2022 before anymore refills. Thank you  . colchicine 0.6 MG tablet TAKE 1 TABLET BY MOUTH EVERY DAY (Patient taking differently: as needed.)  . emtricitabine-tenofovir (TRUVADA) 200-300 MG tablet TAKE ONE TABLET BY MOUTH ONCE DAILY WITH OR WITHOUT FOOD. STORE IN ORIGINAL CONTAINER AT ROOM TEMPERATURE.  . furosemide (LASIX) 40 MG tablet TAKE 1 TABLET BY MOUTH DAILY *TAKE ADDITIONAL 1/2 TAB AS NEEDED FOR SWELLING. Please make yearly appt with Dr. Angelena Form for February 2022. Thank you 1st attempt  . Multiple Vitamin (MULTIVITAMIN) tablet Take 1 tablet by mouth daily.  . naproxen (NAPROSYN) 500 MG tablet Take 1 tablet (500 mg total) by mouth 2 (two) times daily with a meal.  . nitroGLYCERIN (NITROSTAT) 0.4 MG SL tablet Place 0.4 mg under the tongue every 5 (five) minutes as needed for chest pain. Reported on 09/21/2015  . rosuvastatin (CRESTOR) 40 MG tablet TAKE 1 TABLET BY MOUTH EVERY DAY  . Testosterone 30 MG/ACT SOLN APPLY 1 PUMP EVERY DAY     Allergies:   Patient has no known allergies.   Social History   Socioeconomic History  . Marital status: Single  Spouse name: Not on file  . Number of children: 2  . Years of education: Not on file  . Highest education level: Not on file  Occupational History  . Occupation: Realtor  Tobacco Use  . Smoking status: Former Smoker    Quit date: 08/14/1999    Years since quitting: 21.2  . Smokeless tobacco: Never Used  Vaping Use  . Vaping Use: Never used  Substance and Sexual Activity  . Alcohol use: Yes    Comment: 1oz-2oz daily   . Drug use: No  . Sexual activity: Yes  Other Topics Concern  . Not on file  Social History Narrative   Single   Education: [employer]   Exercise: Yes   Social Determinants of Health   Financial Resource Strain: Not on file  Food  Insecurity: Not on file  Transportation Needs: Not on file  Physical Activity: Not on file  Stress: Not on file  Social Connections: Not on file     Family History:  The patient's family history includes Cancer in his mother and another family member; Coronary artery disease in an other family member; Healthy in his daughter and son; Heart attack (age of onset: 46) in his father; Hyperlipidemia in an other family member; Hypertension in his father and paternal grandfather.   ROS:   Please see the history of present illness.    ROS All other systems reviewed and are negative.   PHYSICAL EXAM:   VS:  BP 112/86   Pulse (!) 57   Ht 5\' 8"  (1.727 m)   Wt 183 lb 3.2 oz (83.1 kg)   SpO2 98%   BMI 27.86 kg/m   Physical Exam  GEN: Well nourished, well developed, in no acute distress  Neck: no JVD, carotid bruits, or masses Cardiac:RRR; no murmurs, rubs, or gallops  Respiratory:  clear to auscultation bilaterally, normal work of breathing GI: soft, nontender, nondistended, + BS Ext: without cyanosis, clubbing, or edema, Good distal pulses bilaterally Neuro:  Alert and Oriented x 3 Psych: euthymic mood, full affect  Wt Readings from Last 3 Encounters:  11/02/20 183 lb 3.2 oz (83.1 kg)  07/15/20 183 lb (83 kg)  06/09/20 184 lb (83.5 kg)      Studies/Labs Reviewed:   EKG:  EKG is  ordered today.  The ekg ordered today demonstrates sinus bradycardia at 57 bpm anterior T wave inversion and nonspecific ST-T changes unchanged from prior EKGs  Recent Labs: 05/23/2020: ALT 23; BUN 17; Creatinine, Ser 1.35; Hemoglobin 15.8; Platelets 210; Potassium 4.7; Sodium 139   Lipid Panel    Component Value Date/Time   CHOL 81 (L) 05/23/2020 1515   TRIG 144 05/23/2020 1515   HDL 22 (L) 05/23/2020 1515   CHOLHDL 3.7 05/23/2020 1515   CHOLHDL 3.7 02/03/2016 0839   VLDL 21 02/03/2016 0839   LDLCALC 34 05/23/2020 1515    Additional studies/ records that were reviewed today include:   2D echo  10/08/2018   IMPRESSIONS     1. The left ventricle has normal systolic function with an ejection  fraction of 60-65%. The cavity size was normal. Left ventricular diastolic  Doppler parameters are consistent with pseudonormalization Elevated left  ventricular end-diastolic pressure  The E/e' is 20. There is abnormal septal motion likely secondary to post  operative status.   2. The right ventricle has normal systolic function. The cavity was  normal. There is no increase in right ventricular wall thickness.   3. The mitral valve is normal  in structure. Mild thickening of the mitral  valve leaflet. There is moderate mitral annular calcification present.   4. The tricuspid valve is normal in structure.   5. The aortic valve is normal in structure. Mild sclerosis of the aortic  valve.   6. The pulmonic valve was normal in structure. Pulmonic valve  regurgitation is mild by color flow Doppler.   7. Cannot exclude small PFO with left to right shunt.   FINDINGS   Left Ventricle: The left ventricle has normal systolic function, with an  ejection fraction of 60-65%. The cavity size was normal. There is no  increase in left ventricular wall thickness. Left ventricular diastolic  Doppler parameters are consistent with  pseudonormalization Elevated left ventricular end-diastolic pressure The  E/e' is 20. There is abnormal septal motion likely secondary to post  operative status.  Right Ventricle: The right ventricle has normal systolic function. The  cavity was normal. There is no increase in right ventricular wall  thickness.  Left Atrium: left atrial size was normal in size  Right Atrium: right atrial size was normal in size. Right atrial pressure  is estimated at 3 mmHg.  Interatrial Septum: Cannot exclude small PFO with left to right shunt.  Pericardium: There is no evidence of pericardial effusion.  Mitral Valve: The mitral valve is normal in structure. Mild thickening of  the mitral  valve leaflet. There is moderate mitral annular calcification  present. Mitral valve regurgitation is not visualized by color flow  Doppler.  Tricuspid Valve: The tricuspid valve is normal in structure. Tricuspid  valve regurgitation is trivial by color flow Doppler.  Aortic Valve: The aortic valve is normal in structure. Mild sclerosis of  the aortic valve. Aortic valve regurgitation was not visualized by color  flow Doppler. There is no evidence of aortic valve stenosis.  Pulmonic Valve: The pulmonic valve was normal in structure. Pulmonic valve  regurgitation is mild by color flow Doppler.  Venous: The inferior vena cava is normal in size with greater than 50%  respiratory variability.        Risk Assessment/Calculations:         ASSESSMENT:    1. Coronary artery disease involving coronary bypass graft without angina pectoris, unspecified whether native or transplanted heart   2. Other hyperlipidemia   3. Chronic diastolic CHF (congestive heart failure) ([place])   4. CKD (chronic kidney disease) stage 2, GFR 60-89 ml/min   5. Neuropathy      PLAN:  In order of problems listed above:  CAD status post redo CABG in 2012 with postop atrial fibrillation initially treated with amiodarone but this was stopped and maintaining normal sinus rhythm-no angina.  Follow-up with Dr. Angelena Form in 1 year.   Hyperlipidemia on Crestor.    LDL 34-10/11/21  requesting repeat today.  He did have office smoothly which we will keep in mind.    Chronic diastolic CHF   increased shortness of breath and lower extremity edema and has been taking Lasix 40 mg twice daily.  Creatinine is slightly elevated.  Will repeat today.  Will repeat echo to make sure nothing has changed.  Recommend decrease Lasix to 40 mg once daily with an additional 20 mg if needed.  2 g sodium diet.   CKD stage II last creatinine 1.35-10/11/21 recheck today since taking extra lasix  Neuropathy type pain.  We will add B12 and TSH  to labs     Shared Decision Making/Informed Consent  Medication Adjustments/Labs and Tests Ordered: Current medicines are reviewed at length with the patient today.  Concerns regarding medicines are outlined above.  Medication changes, Labs and Tests ordered today are listed in the Patient Instructions below. Patient Instructions   Medication Instructions:  Your physician recommends that you continue on your current medications as directed. Please refer to the Current Medication list given to you today.  MAKE SURE TO TAKE FUROSEMIDE 40MG  DAILY AND YOU MAY TAKE AN EXTRA 1/2 TABLET AS NEEDED  *If you need a refill on your cardiac medications before your next appointment, please call your pharmacy*   Lab Work: TODAY - CMET, CBC, FLP, B-12, TSH  If you have labs (blood work) drawn today and your tests are completely normal, you will receive your results only by: Marland Kitchen MyChart Message (if you have MyChart) OR . A paper copy in the mail If you have any lab test that is abnormal or we need to change your treatment, we will call you to review the results.   Testing/Procedures: Your physician has requested that you have an echocardiogram. Echocardiography is a painless test that uses sound waves to create images of your heart. It provides your doctor with information about the size and shape of your heart and how well your heart's chambers and valves are working. This procedure takes approximately one hour. There are no restrictions for this procedure.     Follow-Up: At [facility], you and your health needs are our priority.  As part of our continuing mission to provide you with exceptional heart care, we have created designated Provider Care Teams.  These Care Teams include your primary Cardiologist (physician) and Advanced Practice Providers (APPs -  Physician Assistants and Nurse Practitioners) who all work together to provide you with the care you need, when you need it.  Your  next appointment:   1 year(s)  The format for your next appointment:   In Person  Provider:   You may see Lauree Chandler, MD or one of the following Advanced Practice Providers on your designated Care Team:    Melina Copa, PA-C  Ermalinda Barrios, PA-C    Other Instructions  Two Gram Sodium Diet 2000 mg  What is Sodium? Sodium is a mineral found naturally in many foods. The most significant source of sodium in the diet is table salt, which is about 40% sodium.  Processed, convenience, and preserved foods also contain a large amount of sodium.  The body needs only 500 mg of sodium daily to function,  A normal diet provides more than enough sodium even if you do not use salt.  Why Limit Sodium? A build up of sodium in the body can cause thirst, increased blood pressure, shortness of breath, and water retention.  Decreasing sodium in the diet can reduce edema and risk of heart attack or stroke associated with high blood pressure.  Keep in mind that there are many other factors involved in these health problems.  Heredity, obesity, lack of exercise, cigarette smoking, stress and what you eat all play a role.  General Guidelines:  Do not add salt at the table or in cooking.  One teaspoon of salt contains over 2 grams of sodium.  Read food labels  Avoid processed and convenience foods  Ask your dietitian before eating any foods not dicussed in the menu planning guidelines  Consult your physician if you wish to use a salt substitute or a sodium containing medication such as antacids.  Limit milk and  milk products to 16 oz (2 cups) per day.  Shopping Hints:  READ LABELS!! "Dietetic" does not necessarily mean low sodium.  Salt and other sodium ingredients are often added to foods during processing.   Menu Planning Guidelines Food Group Choose More Often Avoid  Beverages (see also the milk group All fruit juices, low-sodium, salt-free vegetables juices, low-sodium carbonated  beverages Regular vegetable or tomato juices, commercially softened water used for drinking or cooking  Breads and Cereals Enriched white, wheat, rye and pumpernickel bread, hard rolls and dinner rolls; muffins, cornbread and waffles; most dry cereals, cooked cereal without added salt; unsalted crackers and breadsticks; low sodium or homemade bread crumbs Bread, rolls and crackers with salted tops; quick breads; instant hot cereals; pancakes; commercial bread stuffing; self-rising flower and biscuit mixes; regular bread crumbs or cracker crumbs  Desserts and Sweets Desserts and sweets mad with mild should be within allowance Instant pudding mixes and cake mixes  Fats Butter or margarine; vegetable oils; unsalted salad dressings, regular salad dressings limited to 1 Tbs; light, sour and heavy cream Regular salad dressings containing bacon fat, bacon bits, and salt pork; snack dips made with instant soup mixes or processed cheese; salted nuts  Fruits Most fresh, frozen and canned fruits Fruits processed with salt or sodium-containing ingredient (some dried fruits are processed with sodium sulfites        Vegetables Fresh, frozen vegetables and low- sodium canned vegetables Regular canned vegetables, sauerkraut, pickled vegetables, and others prepared in brine; frozen vegetables in sauces; vegetables seasoned with ham, bacon or salt pork  Condiments, Sauces, Miscellaneous  Salt substitute with physician's approval; pepper, herbs, spices; vinegar, lemon or lime juice; hot pepper sauce; garlic powder, onion powder, low sodium soy sauce (1 Tbs.); low sodium condiments (ketchup, chili sauce, mustard) in limited amounts (1 tsp.) fresh ground horseradish; unsalted tortilla chips, pretzels, potato chips, popcorn, salsa (1/4 cup) Any seasoning made with salt including garlic salt, celery salt, onion salt, and seasoned salt; sea salt, rock salt, kosher salt; meat tenderizers; monosodium glutamate; mustard, regular  soy sauce, barbecue, sauce, chili sauce, teriyaki sauce, steak sauce, Worcestershire sauce, and most flavored vinegars; canned gravy and mixes; regular condiments; salted snack foods, olives, picles, relish, horseradish sauce, catsup   Food preparation: Try these seasonings Meats:    Pork Sage, onion Serve with applesauce  Chicken Poultry seasoning, thyme, parsley Serve with cranberry sauce  Lamb Curry powder, rosemary, garlic, thyme Serve with mint sauce or jelly  Veal Marjoram, basil Serve with current jelly, cranberry sauce  Beef Pepper, bay leaf Serve with dry mustard, unsalted chive butter  Fish [place] leaf, dill Serve with unsalted lemon butter, unsalted parsley butter  Vegetables:    Asparagus Lemon juice   Broccoli Lemon juice   Carrots Mustard dressing parsley, mint, nutmeg, glazed with unsalted butter and sugar   Green beans Marjoram, lemon juice, nutmeg,dill seed   Tomatoes Basil, marjoram, onion   Spice /blend for [employer]" 4 tsp ground thyme 1 tsp ground sage 3 tsp ground rosemary 4 tsp ground marjoram   Test your knowledge 1. A product that says "Salt Free" may still contain sodium. True or False 2. Garlic Powder and Hot Pepper Sauce an be used as alternative seasonings.True or False 3. Processed foods have more sodium than fresh foods.  True or False 4. Canned Vegetables have less sodium than froze True or False  WAYS TO DECREASE YOUR SODIUM INTAKE 1. Avoid the use of added salt in cooking and at  the table.  Table salt (and other prepared seasonings which contain salt) is probably one of the greatest sources of sodium in the diet.  Unsalted foods can gain flavor from the sweet, sour, and butter taste sensations of herbs and spices.  Instead of using salt for seasoning, try the following seasonings with the foods listed.  Remember: how you use them to enhance natural food flavors is limited only by your creativity... Allspice-Meat, fish, eggs, fruit, peas, red and yellow  vegetables Almond Extract-Fruit baked goods Anise Seed-Sweet breads, fruit, carrots, beets, cottage cheese, cookies (tastes like licorice) Basil-Meat, fish, eggs, vegetables, rice, vegetables salads, soups, sauces Bay Leaf-Meat, fish, stews, poultry Burnet-Salad, vegetables (cucumber-like flavor) Caraway Seed-Bread, cookies, cottage cheese, meat, vegetables, cheese, rice Cardamon-Baked goods, fruit, soups Celery Powder or seed-Salads, salad dressings, sauces, meatloaf, soup, bread.Do not use  celery salt Chervil-Meats, salads, fish, eggs, vegetables, cottage cheese (parsley-like flavor) Chili Power-Meatloaf, chicken cheese, corn, eggplant, egg dishes Chives-Salads cottage cheese, egg dishes, soups, vegetables, sauces Cilantro-Salsa, casseroles Cinnamon-Baked goods, fruit, pork, lamb, chicken, carrots Cloves-Fruit, baked goods, fish, pot roast, green beans, beets, carrots Coriander-Pastry, cookies, meat, salads, cheese (lemon-orange flavor) Cumin-Meatloaf, fish,cheese, eggs, cabbage,fruit pie (caraway flavor) [employer], fruit, eggs, fish, poultry, cottage cheese, vegetables Dill Seed-Meat, cottage cheese, poultry, vegetables, fish, salads, bread Fennel Seed-Bread, cookies, apples, pork, eggs, fish, beets, cabbage, cheese, Licorice-like flavor Garlic-(buds or powder) Salads, meat, poultry, fish, bread, butter, vegetables, potatoes.Do not  use garlic salt Ginger-Fruit, vegetables, baked goods, meat, fish, poultry Horseradish Root-Meet, vegetables, butter Lemon Juice or Extract-Vegetables, fruit, tea, baked goods, fish salads Mace-Baked goods fruit, vegetables, fish, poultry (taste like nutmeg) Maple Extract-Syrups Marjoram-Meat, chicken, fish, vegetables, breads, green salads (taste like Sage) Mint-Tea, lamb, sherbet, vegetables, desserts, carrots, cabbage Mustard, Dry or Seed-Cheese, eggs, meats, vegetables, poultry Nutmeg-Baked goods, fruit, chicken, eggs, vegetables,  desserts Onion Powder-Meat, fish, poultry, vegetables, cheese, eggs, bread, rice salads (Do not use   Onion salt) Orange Extract-Desserts, baked goods Oregano-Pasta, eggs, cheese, onions, pork, lamb, fish, chicken, vegetables, green salads Paprika-Meat, fish, poultry, eggs, cheese, vegetables Parsley Flakes-Butter, vegetables, meat fish, poultry, eggs, bread, salads (certain forms may   Contain sodium Pepper-Meat fish, poultry, vegetables, eggs Peppermint Extract-Desserts, baked goods Poppy Seed-Eggs, bread, cheese, fruit dressings, baked goods, noodles, vegetables, cottage  [employer], poultry, meat, fish, cauliflower, turnips,eggs bread Saffron-Rice, bread, veal, chicken, fish, eggs Sage-Meat, fish, poultry, onions, eggplant, tomateos, pork, stews Savory-Eggs, salads, poultry, meat, rice, vegetables, soups, pork Tarragon-Meat, poultry, fish, eggs, butter, vegetables (licorice-like flavor)  Thyme-Meat, poultry, fish, eggs, vegetables, (clover-like flavor), sauces, soups Tumeric-Salads, butter, eggs, fish, rice, vegetables (saffron-like flavor) Vanilla Extract-Baked goods, candy Vinegar-Salads, vegetables, meat marinades Walnut Extract-baked goods, candy  2. Choose your Foods Wisely   The following is a list of foods to avoid which are high in sodium:  Meats-Avoid all smoked, canned, salt cured, dried and kosher meat and fish as well as Anchovies   [employer] meats:Bologna, Liverwurst, Pastrami Canned meat or fish  Marinated herring Caviar    Pepperoni Corned Beef   Pizza Dried chipped beef  Salami Frozen breaded fish or meat Salt pork Frankfurters or hot dogs  Sardines Gefilte fish   Sausage Ham (boiled ham, Proscuitto Smoked butt    spiced ham)   Spam      TV Dinners Vegetables Canned vegetables (Regular) Relish Canned mushrooms  Sauerkraut Olives    Tomato juice Pickles  Bakery and Dessert Products Canned  puddings  Cream pies Cheesecake  Decorated cakes Cookies  Beverages/Juices Tomato juice, regular  Gatorade   V-8 vegetable juice, regular  Breads and Cereals Biscuit mixes   Salted potato chips, corn chips, pretzels Bread stuffing mixes  Salted crackers and rolls Pancake and waffle mixes Self-rising flour  Seasonings Accent    Meat sauces Barbecue sauce  Meat tenderizer Catsup    Monosodium glutamate (MSG) Celery salt   Onion salt Chili sauce   Prepared mustard Garlic salt   Salt, seasoned salt, sea salt Gravy mixes   Soy sauce Horseradish   Steak sauce Ketchup   Tartar sauce Lite salt    Teriyaki sauce Marinade mixes   Worcestershire sauce  Others Baking powder   Cocoa and cocoa mixes Baking soda   Commercial casserole mixes Candy-caramels, chocolate  Dehydrated soups    Bars, fudge,nougats  Instant rice and pasta mixes Canned broth or soup  Maraschino cherries Cheese, aged and processed cheese and cheese spreads  Learning Assessment Quiz  Indicated T (for True) or F (for False) for each of the following statements:  1. _____ Fresh fruits and vegetables and unprocessed grains are generally low in sodium 2. _____ Water may contain a considerable amount of sodium, depending on the source 3. _____ You can always tell if a food is high in sodium by tasting it 4. _____ Certain laxatives my be high in sodium and should be avoided unless prescribed   by a physician or pharmacist 5. _____ Salt substitutes may be used freely by anyone on a sodium restricted diet 6. _____ Sodium is present in table salt, food additives and as a natural component of   most foods 7. _____ Table salt is approximately 90% sodium 8. _____ Limiting sodium intake may help prevent excess fluid accumulation in the body 9. _____ On a sodium-restricted diet, seasonings such as bouillon soy sauce, and    cooking wine should be used in place of table salt 10. _____ On an ingredient list, a product which  lists monosodium glutamate as the first   ingredient is an appropriate food to include on a low sodium diet  Circle the best answer(s) to the following statements (Hint: there may be more than one correct answer)  11. On a low-sodium diet, some acceptable snack items are:    A. Olives  F. Bean dip   K. Grapefruit juice    B. Salted Pretzels G. Commercial Popcorn   L. Canned peaches    C. Carrot Sticks  H. Bouillon   M. Unsalted nuts   D. [place] fries  I. Peanut butter crackers N. Salami   E. Sweet pickles J. Tomato Juice   O. Pizza  12.  Seasonings that may be used freely on a reduced - sodium diet include   A. Lemon wedges F.Monosodium glutamate K. Celery seed    B.Soysauce   G. Pepper   L. Mustard powder   C. Sea salt  H. Cooking wine  M. Onion flakes   D. Vinegar  E. Prepared horseradish N. Salsa   E. Sage   J. Worcestershire sauce  O. [address]      Sumner Boast, PA-C  11/02/2020 11:03 AM    [facility] [place], [place], [place]  [id number] Phone: [phone]; Fax: [phone]

## 2020-11-02 ENCOUNTER — Other Ambulatory Visit: Payer: Self-pay

## 2020-11-02 ENCOUNTER — Encounter: Payer: Self-pay | Admitting: Physician Assistant

## 2020-11-02 ENCOUNTER — Ambulatory Visit: Payer: BC Managed Care – PPO | Admitting: Physician Assistant

## 2020-11-02 VITALS — BP 112/86 | HR 57 | Ht 68.0 in | Wt 183.2 lb

## 2020-11-02 DIAGNOSIS — I25810 Atherosclerosis of coronary artery bypass graft(s) without angina pectoris: Secondary | ICD-10-CM | POA: Diagnosis not present

## 2020-11-02 DIAGNOSIS — E7849 Other hyperlipidemia: Secondary | ICD-10-CM

## 2020-11-02 DIAGNOSIS — N182 Chronic kidney disease, stage 2 (mild): Secondary | ICD-10-CM

## 2020-11-02 DIAGNOSIS — I5032 Chronic diastolic (congestive) heart failure: Secondary | ICD-10-CM

## 2020-11-02 DIAGNOSIS — G629 Polyneuropathy, unspecified: Secondary | ICD-10-CM

## 2020-11-02 NOTE — Patient Instructions (Signed)
Medication Instructions:  Your physician recommends that you continue on your current medications as directed. Please refer to the Current Medication list given to you today.  MAKE SURE TO TAKE FUROSEMIDE 40MG  DAILY AND YOU MAY TAKE AN EXTRA 1/2 TABLET AS NEEDED  *If you need a refill on your cardiac medications before your next appointment, please call your pharmacy*   Lab Work: TODAY - CMET, CBC, FLP, B-12, TSH  If you have labs (blood work) drawn today and your tests are completely normal, you will receive your results only by: Marland Kitchen MyChart Message (if you have MyChart) OR . A paper copy in the mail If you have any lab test that is abnormal or we need to change your treatment, we will call you to review the results.   Testing/Procedures: Your physician has requested that you have an echocardiogram. Echocardiography is a painless test that uses sound waves to create images of your heart. It provides your doctor with information about the size and shape of your heart and how well your heart's chambers and valves are working. This procedure takes approximately one hour. There are no restrictions for this procedure.     Follow-Up: At [facility], you and your health needs are our priority.  As part of our continuing mission to provide you with exceptional heart care, we have created designated Provider Care Teams.  These Care Teams include your primary Cardiologist (physician) and Advanced Practice Providers (APPs -  Physician Assistants and Nurse Practitioners) who all work together to provide you with the care you need, when you need it.  Your next appointment:   1 year(s)  The format for your next appointment:   In Person  Provider:   You may see Lauree Chandler, MD or one of the following Advanced Practice Providers on your designated Care Team:    Melina Copa, PA-C  Ermalinda Barrios, PA-C    Other Instructions  Two Gram Sodium Diet 2000 mg  What is Sodium? Sodium is  a mineral found naturally in many foods. The most significant source of sodium in the diet is table salt, which is about 40% sodium.  Processed, convenience, and preserved foods also contain a large amount of sodium.  The body needs only 500 mg of sodium daily to function,  A normal diet provides more than enough sodium even if you do not use salt.  Why Limit Sodium? A build up of sodium in the body can cause thirst, increased blood pressure, shortness of breath, and water retention.  Decreasing sodium in the diet can reduce edema and risk of heart attack or stroke associated with high blood pressure.  Keep in mind that there are many other factors involved in these health problems.  Heredity, obesity, lack of exercise, cigarette smoking, stress and what you eat all play a role.  General Guidelines:  Do not add salt at the table or in cooking.  One teaspoon of salt contains over 2 grams of sodium.  Read food labels  Avoid processed and convenience foods  Ask your dietitian before eating any foods not dicussed in the menu planning guidelines  Consult your physician if you wish to use a salt substitute or a sodium containing medication such as antacids.  Limit milk and milk products to 16 oz (2 cups) per day.  Shopping Hints:  READ LABELS!! "Dietetic" does not necessarily mean low sodium.  Salt and other sodium ingredients are often added to foods during processing.   Menu Planning Guidelines Food  Group Choose More Often Avoid  Beverages (see also the milk group All fruit juices, low-sodium, salt-free vegetables juices, low-sodium carbonated beverages Regular vegetable or tomato juices, commercially softened water used for drinking or cooking  Breads and Cereals Enriched white, wheat, rye and pumpernickel bread, hard rolls and dinner rolls; muffins, cornbread and waffles; most dry cereals, cooked cereal without added salt; unsalted crackers and breadsticks; low sodium or homemade bread crumbs  Bread, rolls and crackers with salted tops; quick breads; instant hot cereals; pancakes; commercial bread stuffing; self-rising flower and biscuit mixes; regular bread crumbs or cracker crumbs  Desserts and Sweets Desserts and sweets mad with mild should be within allowance Instant pudding mixes and cake mixes  Fats Butter or margarine; vegetable oils; unsalted salad dressings, regular salad dressings limited to 1 Tbs; light, sour and heavy cream Regular salad dressings containing bacon fat, bacon bits, and salt pork; snack dips made with instant soup mixes or processed cheese; salted nuts  Fruits Most fresh, frozen and canned fruits Fruits processed with salt or sodium-containing ingredient (some dried fruits are processed with sodium sulfites        Vegetables Fresh, frozen vegetables and low- sodium canned vegetables Regular canned vegetables, sauerkraut, pickled vegetables, and others prepared in brine; frozen vegetables in sauces; vegetables seasoned with ham, bacon or salt pork  Condiments, Sauces, Miscellaneous  Salt substitute with physician's approval; pepper, herbs, spices; vinegar, lemon or lime juice; hot pepper sauce; garlic powder, onion powder, low sodium soy sauce (1 Tbs.); low sodium condiments (ketchup, chili sauce, mustard) in limited amounts (1 tsp.) fresh ground horseradish; unsalted tortilla chips, pretzels, potato chips, popcorn, salsa (1/4 cup) Any seasoning made with salt including garlic salt, celery salt, onion salt, and seasoned salt; sea salt, rock salt, kosher salt; meat tenderizers; monosodium glutamate; mustard, regular soy sauce, barbecue, sauce, chili sauce, teriyaki sauce, steak sauce, Worcestershire sauce, and most flavored vinegars; canned gravy and mixes; regular condiments; salted snack foods, olives, picles, relish, horseradish sauce, catsup   Food preparation: Try these seasonings Meats:    Pork Sage, onion Serve with applesauce  Chicken Poultry seasoning,  thyme, parsley Serve with cranberry sauce  Lamb Curry powder, rosemary, garlic, thyme Serve with mint sauce or jelly  Veal Marjoram, basil Serve with current jelly, cranberry sauce  Beef Pepper, bay leaf Serve with dry mustard, unsalted chive butter  Fish [place] leaf, dill Serve with unsalted lemon butter, unsalted parsley butter  Vegetables:    Asparagus Lemon juice   Broccoli Lemon juice   Carrots Mustard dressing parsley, mint, nutmeg, glazed with unsalted butter and sugar   Green beans Marjoram, lemon juice, nutmeg,dill seed   Tomatoes Basil, marjoram, onion   Spice /blend for [employer]" 4 tsp ground thyme 1 tsp ground sage 3 tsp ground rosemary 4 tsp ground marjoram   Test your knowledge 1. A product that says "Salt Free" may still contain sodium. True or False 2. Garlic Powder and Hot Pepper Sauce an be used as alternative seasonings.True or False 3. Processed foods have more sodium than fresh foods.  True or False 4. Canned Vegetables have less sodium than froze True or False  WAYS TO DECREASE YOUR SODIUM INTAKE 1. Avoid the use of added salt in cooking and at the table.  Table salt (and other prepared seasonings which contain salt) is probably one of the greatest sources of sodium in the diet.  Unsalted foods can gain flavor from the sweet, sour, and butter taste sensations of herbs and  spices.  Instead of using salt for seasoning, try the following seasonings with the foods listed.  Remember: how you use them to enhance natural food flavors is limited only by your creativity... Allspice-Meat, fish, eggs, fruit, peas, red and yellow vegetables Almond Extract-Fruit baked goods Anise Seed-Sweet breads, fruit, carrots, beets, cottage cheese, cookies (tastes like licorice) Basil-Meat, fish, eggs, vegetables, rice, vegetables salads, soups, sauces Bay Leaf-Meat, fish, stews, poultry Burnet-Salad, vegetables (cucumber-like flavor) Caraway Seed-Bread, cookies, cottage cheese, meat,  vegetables, cheese, rice Cardamon-Baked goods, fruit, soups Celery Powder or seed-Salads, salad dressings, sauces, meatloaf, soup, bread.Do not use  celery salt Chervil-Meats, salads, fish, eggs, vegetables, cottage cheese (parsley-like flavor) Chili Power-Meatloaf, chicken cheese, corn, eggplant, egg dishes Chives-Salads cottage cheese, egg dishes, soups, vegetables, sauces Cilantro-Salsa, casseroles Cinnamon-Baked goods, fruit, pork, lamb, chicken, carrots Cloves-Fruit, baked goods, fish, pot roast, green beans, beets, carrots Coriander-Pastry, cookies, meat, salads, cheese (lemon-orange flavor) Cumin-Meatloaf, fish,cheese, eggs, cabbage,fruit pie (caraway flavor) [employer], fruit, eggs, fish, poultry, cottage cheese, vegetables Dill Seed-Meat, cottage cheese, poultry, vegetables, fish, salads, bread Fennel Seed-Bread, cookies, apples, pork, eggs, fish, beets, cabbage, cheese, Licorice-like flavor Garlic-(buds or powder) Salads, meat, poultry, fish, bread, butter, vegetables, potatoes.Do not  use garlic salt Ginger-Fruit, vegetables, baked goods, meat, fish, poultry Horseradish Root-Meet, vegetables, butter Lemon Juice or Extract-Vegetables, fruit, tea, baked goods, fish salads Mace-Baked goods fruit, vegetables, fish, poultry (taste like nutmeg) Maple Extract-Syrups Marjoram-Meat, chicken, fish, vegetables, breads, green salads (taste like Sage) Mint-Tea, lamb, sherbet, vegetables, desserts, carrots, cabbage Mustard, Dry or Seed-Cheese, eggs, meats, vegetables, poultry Nutmeg-Baked goods, fruit, chicken, eggs, vegetables, desserts Onion Powder-Meat, fish, poultry, vegetables, cheese, eggs, bread, rice salads (Do not use   Onion salt) Orange Extract-Desserts, baked goods Oregano-Pasta, eggs, cheese, onions, pork, lamb, fish, chicken, vegetables, green salads Paprika-Meat, fish, poultry, eggs, cheese, vegetables Parsley Flakes-Butter, vegetables, meat fish, poultry, eggs, bread,  salads (certain forms may   Contain sodium Pepper-Meat fish, poultry, vegetables, eggs Peppermint Extract-Desserts, baked goods Poppy Seed-Eggs, bread, cheese, fruit dressings, baked goods, noodles, vegetables, cottage  [employer], poultry, meat, fish, cauliflower, turnips,eggs bread Saffron-Rice, bread, veal, chicken, fish, eggs Sage-Meat, fish, poultry, onions, eggplant, tomateos, pork, stews Savory-Eggs, salads, poultry, meat, rice, vegetables, soups, pork Tarragon-Meat, poultry, fish, eggs, butter, vegetables (licorice-like flavor)  Thyme-Meat, poultry, fish, eggs, vegetables, (clover-like flavor), sauces, soups Tumeric-Salads, butter, eggs, fish, rice, vegetables (saffron-like flavor) Vanilla Extract-Baked goods, candy Vinegar-Salads, vegetables, meat marinades Walnut Extract-baked goods, candy  2. Choose your Foods Wisely   The following is a list of foods to avoid which are high in sodium:  Meats-Avoid all smoked, canned, salt cured, dried and kosher meat and fish as well as Anchovies   [employer] meats:Bologna, Liverwurst, Pastrami Canned meat or fish  Marinated herring Caviar    Pepperoni Corned Beef   Pizza Dried chipped beef  Salami Frozen breaded fish or meat Salt pork Frankfurters or hot dogs  Sardines Gefilte fish   Sausage Ham (boiled ham, Proscuitto Smoked butt    spiced ham)   Spam      TV Dinners Vegetables Canned vegetables (Regular) Relish Canned mushrooms  Sauerkraut Olives    Tomato juice Pickles  Bakery and Dessert Products Canned puddings  Cream pies Cheesecake   Decorated cakes Cookies  Beverages/Juices Tomato juice, regular  Gatorade   V-8 vegetable juice, regular  Breads and Cereals Biscuit mixes   Salted potato chips, corn chips, pretzels Bread stuffing mixes  Salted crackers and rolls Pancake and waffle  mixes Self-rising flour  Seasonings Accent    Meat sauces Barbecue sauce  Meat  tenderizer Catsup    Monosodium glutamate (MSG) Celery salt   Onion salt Chili sauce   Prepared mustard Garlic salt   Salt, seasoned salt, sea salt Gravy mixes   Soy sauce Horseradish   Steak sauce Ketchup   Tartar sauce Lite salt    Teriyaki sauce Marinade mixes   Worcestershire sauce  Others Baking powder   Cocoa and cocoa mixes Baking soda   Commercial casserole mixes Candy-caramels, chocolate  Dehydrated soups    Bars, fudge,nougats  Instant rice and pasta mixes Canned broth or soup  Maraschino cherries Cheese, aged and processed cheese and cheese spreads  Learning Assessment Quiz  Indicated T (for True) or F (for False) for each of the following statements:  1. _____ Fresh fruits and vegetables and unprocessed grains are generally low in sodium 2. _____ Water may contain a considerable amount of sodium, depending on the source 3. _____ You can always tell if a food is high in sodium by tasting it 4. _____ Certain laxatives my be high in sodium and should be avoided unless prescribed   by a physician or pharmacist 5. _____ Salt substitutes may be used freely by anyone on a sodium restricted diet 6. _____ Sodium is present in table salt, food additives and as a natural component of   most foods 7. _____ Table salt is approximately 90% sodium 8. _____ Limiting sodium intake may help prevent excess fluid accumulation in the body 9. _____ On a sodium-restricted diet, seasonings such as bouillon soy sauce, and    cooking wine should be used in place of table salt 10. _____ On an ingredient list, a product which lists monosodium glutamate as the first   ingredient is an appropriate food to include on a low sodium diet  Circle the best answer(s) to the following statements (Hint: there may be more than one correct answer)  11. On a low-sodium diet, some acceptable snack items are:    A. Olives  F. Bean dip   K. Grapefruit juice    B. Salted Pretzels G. Commercial Popcorn   L.  Canned peaches    C. Carrot Sticks  H. Bouillon   M. Unsalted nuts   D. [place] fries  I. Peanut butter crackers N. Salami   E. Sweet pickles J. Tomato Juice   O. Pizza  12.  Seasonings that may be used freely on a reduced - sodium diet include   A. Lemon wedges F.Monosodium glutamate K. Celery seed    B.Soysauce   G. Pepper   L. Mustard powder   C. Sea salt  H. Cooking wine  M. Onion flakes   D. Vinegar  E. Prepared horseradish N. Salsa   E. Sage   J. Worcestershire sauce  O. Chutney

## 2020-11-03 LAB — COMPREHENSIVE METABOLIC PANEL
ALT: 27 IU/L (ref 0–44)
AST: 30 IU/L (ref 0–40)
Albumin/Globulin Ratio: 2.3 — ABNORMAL HIGH (ref 1.2–2.2)
Albumin: 5.1 g/dL — ABNORMAL HIGH (ref 3.8–4.8)
Alkaline Phosphatase: 71 IU/L (ref 44–121)
BUN/Creatinine Ratio: 13 (ref 10–24)
BUN: 19 mg/dL (ref 8–27)
Bilirubin Total: 0.4 mg/dL (ref 0.0–1.2)
CO2: 27 mmol/L (ref 20–29)
Calcium: 9.5 mg/dL (ref 8.6–10.2)
Chloride: 96 mmol/L (ref 96–106)
Creatinine, Ser: 1.48 mg/dL — ABNORMAL HIGH (ref 0.76–1.27)
Globulin, Total: 2.2 g/dL (ref 1.5–4.5)
Glucose: 78 mg/dL (ref 65–99)
Potassium: 4.6 mmol/L (ref 3.5–5.2)
Sodium: 138 mmol/L (ref 134–144)
Total Protein: 7.3 g/dL (ref 6.0–8.5)
eGFR: 40 mL/min/{1.73_m2} — ABNORMAL LOW (ref >59)

## 2020-11-03 LAB — LIPID PANEL
Chol/HDL Ratio: 3.9 ratio (ref 0.0–5.0)
Cholesterol, Total: 89 mg/dL — ABNORMAL LOW (ref 100–199)
HDL: 23 mg/dL — ABNORMAL LOW (ref >39)
LDL Chol Calc (NIH): 36 mg/dL (ref 0–99)
Triglycerides: 185 mg/dL — ABNORMAL HIGH (ref 0–149)
VLDL Cholesterol Cal: 30 mg/dL (ref 5–40)

## 2020-11-03 LAB — VITAMIN B12: Vitamin B-12: 1501 pg/mL — ABNORMAL HIGH (ref 232–1245)

## 2020-11-03 LAB — CBC
Hematocrit: 49 % (ref 37.5–51.0)
Hemoglobin: 16 g/dL (ref 13.0–17.7)
MCH: 30.8 pg (ref 26.6–33.0)
MCHC: 32.7 g/dL (ref 31.5–35.7)
MCV: 94 fL (ref 79–97)
Platelets: 206 10*3/uL (ref 150–450)
RBC: 5.2 x10E6/uL (ref 4.14–5.80)
RDW: 12.5 % (ref 11.6–15.4)
WBC: 8.7 10*3/uL (ref 3.4–10.8)

## 2020-11-03 LAB — TSH: TSH: 3.64 u[IU]/mL (ref 0.450–4.500)

## 2020-11-07 ENCOUNTER — Other Ambulatory Visit: Payer: Self-pay

## 2020-11-07 ENCOUNTER — Telehealth: Payer: Self-pay | Admitting: Physician Assistant

## 2020-11-07 DIAGNOSIS — N182 Chronic kidney disease, stage 2 (mild): Secondary | ICD-10-CM

## 2020-11-07 NOTE — Telephone Encounter (Signed)
Patient returning call from Friday 11/04/20 to discuss lab results. Please call back

## 2020-11-11 ENCOUNTER — Other Ambulatory Visit: Payer: Self-pay | Admitting: Cardiovascular Disease

## 2020-11-20 ENCOUNTER — Other Ambulatory Visit: Payer: Self-pay | Admitting: Family Medicine

## 2020-11-20 DIAGNOSIS — R7989 Other specified abnormal findings of blood chemistry: Secondary | ICD-10-CM

## 2020-11-20 DIAGNOSIS — E291 Testicular hypofunction: Secondary | ICD-10-CM

## 2020-11-21 ENCOUNTER — Other Ambulatory Visit: Payer: Self-pay

## 2020-11-21 ENCOUNTER — Other Ambulatory Visit: Payer: BC Managed Care – PPO | Admitting: *Deleted

## 2020-11-21 DIAGNOSIS — N182 Chronic kidney disease, stage 2 (mild): Secondary | ICD-10-CM

## 2020-11-21 LAB — BASIC METABOLIC PANEL
BUN/Creatinine Ratio: 12 (ref 10–24)
BUN: 18 mg/dL (ref 8–27)
CO2: 25 mmol/L (ref 20–29)
Calcium: 9.5 mg/dL (ref 8.6–10.2)
Chloride: 99 mmol/L (ref 96–106)
Creatinine, Ser: 1.52 mg/dL — ABNORMAL HIGH (ref 0.76–1.27)
Glucose: 103 mg/dL — ABNORMAL HIGH (ref 65–99)
Potassium: 4.4 mmol/L (ref 3.5–5.2)
Sodium: 141 mmol/L (ref 134–144)
eGFR: 51 mL/min/{1.73_m2} — ABNORMAL LOW (ref >59)

## 2020-11-22 ENCOUNTER — Other Ambulatory Visit: Payer: Self-pay

## 2020-11-22 DIAGNOSIS — N182 Chronic kidney disease, stage 2 (mild): Secondary | ICD-10-CM

## 2020-11-23 NOTE — Telephone Encounter (Signed)
Controlled substance database (PDMP) reviewed. No concerns appreciated.  Last filled 10/18/2020.  Refill granted, but needs follow-up appointment, noted on prescription.

## 2020-11-24 ENCOUNTER — Encounter: Payer: Self-pay | Admitting: Rheumatology

## 2020-11-24 ENCOUNTER — Encounter: Payer: Self-pay | Admitting: *Deleted

## 2020-11-24 DIAGNOSIS — M1A09X0 Idiopathic chronic gout, multiple sites, without tophus (tophi): Secondary | ICD-10-CM

## 2020-11-24 MED ORDER — PREDNISONE 5 MG PO TABS: ORAL_TABLET | ORAL | 0 refills | Status: DC

## 2020-11-24 MED ORDER — COLCHICINE 0.6 MG PO TABS: 0.6000 mg | ORAL_TABLET | Freq: Every day | ORAL | 0 refills | Status: DC

## 2020-11-24 NOTE — Telephone Encounter (Signed)
Next Visit: message sent to front desk to schedule appt, Return in about 6 months (around 12/08/2020) for Gout, Osteoarthritis.  Advised him to get labs 2 weeks prior to his future appointment  Last Visit: 06/09/2020  Last Fill: 04/19/2020  DX: Idiopathic chronic gout of multiple sites without tophus   Current Dose per office note 06/09/2020, colchicine 1 tablet p.o. daily  Labs: 11/21/2020, Glucose 103, Creatinine 1.52, eGRF 51   Okay to refill Colchicine?  Please see message regarding prednisone.

## 2020-11-24 NOTE — Telephone Encounter (Signed)
Ok to refill colchicine.  Ok to provide prednisone taper starting at 20 mg tapering by 5 mg every 2 days.

## 2020-11-24 NOTE — Telephone Encounter (Signed)
Please call patient to schedule appt. Thank you.  Return in about 6 months (around 12/08/2020) for Gout, Osteoarthritis.  Advised him to get labs 2 weeks prior to his future appointment

## 2020-11-24 NOTE — Addendum Note (Signed)
Addended by: Shona Needles on: 11/24/2020 02:21 PM   Modules accepted: Orders

## 2020-11-30 ENCOUNTER — Other Ambulatory Visit: Payer: Self-pay

## 2020-11-30 ENCOUNTER — Other Ambulatory Visit: Payer: Self-pay | Admitting: Registered Nurse

## 2020-11-30 ENCOUNTER — Ambulatory Visit (HOSPITAL_COMMUNITY): Payer: BC Managed Care – PPO | Attending: Cardiovascular Disease

## 2020-11-30 DIAGNOSIS — I5032 Chronic diastolic (congestive) heart failure: Secondary | ICD-10-CM | POA: Diagnosis not present

## 2020-11-30 DIAGNOSIS — M109 Gout, unspecified: Secondary | ICD-10-CM

## 2020-11-30 LAB — ECHOCARDIOGRAM COMPLETE
Area-P 1/2: 3.37 cm2
S' Lateral: 2.7 cm

## 2020-11-30 NOTE — Progress Notes (Signed)
Office Visit Note  Patient: Jeremy Hendricks             Date of Birth: 03/09/1956           MRN: [id number]             PCP: Wendie Agreste, MD Referring: Wendie Agreste, MD Visit Date: 12/14/2020 Occupation: @GUAROCC @  Subjective:  Medication monitoring.   History of Present Illness: Jeremy Hendricks is a 65 y.o. male with a history of gout, osteoarthritis and degenerative disc disease.  He states he had a mild flare of gout last month after drinking some alcohol.  His symptoms resolved after taking colchicine.  He has been taking allopurinol on a daily basis.  He states has been watching his diet.  He drinks alcohol socially.  He has also cut back on Lasix and takes about half a pill a day which has been helpful.  He has some lower back pain and also has been experiencing some tingling in his left lower extremity.  He sees chiropractor off-and-on.  Activities of Daily Living:  Patient reports morning stiffness for 5-10 minutes.   Patient Denies nocturnal pain.  Difficulty dressing/grooming: Denies Difficulty climbing stairs: Denies Difficulty getting out of chair: Denies Difficulty using hands for taps, buttons, cutlery, and/or writing: Denies  Review of Systems  Constitutional: Negative for fatigue and night sweats.  HENT: Negative for mouth sores, mouth dryness and nose dryness.   Eyes: Negative for pain, redness, itching and dryness.  Respiratory: Negative for shortness of breath and difficulty breathing.   Cardiovascular: Negative for chest pain, palpitations, hypertension, irregular heartbeat and swelling in legs/feet.  Gastrointestinal: Negative for blood in stool, constipation and diarrhea.  Endocrine: Negative for increased urination.  Genitourinary: Negative for difficulty urinating.  Musculoskeletal: Positive for arthralgias, joint pain and morning stiffness. Negative for joint swelling, myalgias, muscle weakness, muscle tenderness and myalgias.  Skin: Negative for  color change, rash, hair loss, nodules/bumps, redness, skin tightness, ulcers and sensitivity to sunlight.  Allergic/Immunologic: Positive for susceptible to infections.  Neurological: Positive for numbness. Negative for dizziness, fainting, headaches, memory loss, night sweats and weakness.  Hematological: Negative for bruising/bleeding tendency and swollen glands.  Psychiatric/Behavioral: Negative for depressed mood, confusion and sleep disturbance. The patient is not nervous/anxious.     PMFS History:  Patient Active Problem List   Diagnosis Date Noted  . Skin fissures 10/09/2018  . Skin infection 10/09/2018  . Chronic diastolic CHF (congestive heart failure) ([place]) 09/23/2018  . CKD (chronic kidney disease) stage 2, GFR 60-89 ml/min 09/23/2018  . History of ETT   . Gout   . DJD (degenerative joint disease)   . Coronary artery disease   . Atrial fibrillation ([place])   . 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 HLD (hyperlipidemia) 06/11/2016  . Hypogonadism in male 09/09/2015  . INSOMNIA 09/19/2010  . SHORTNESS OF BREATH 09/19/2010  . CHEST PAIN-PRECORDIAL 08/23/2010  . CAD, ARTERY BYPASS GRAFT 10/26/2009  . HYPERLIPIDEMIA 10/24/2009  . DEGENERATIVE JOINT DISEASE 10/24/2009  . ANGINA, HX OF 10/24/2009    Past Medical History:  Diagnosis Date  . Atrial fibrillation ([place])    postoperative  . Chronic diastolic CHF (congestive heart failure) ([place])   . CKD (chronic kidney disease), stage II   . Coronary artery disease    a. s/p CABG 2002. b. s/p redo 2012.  Marland Kitchen DJD (degenerative joint disease)   . Gout   . History of ETT    a. ETT 6/16:  normal  Family History  Problem Relation Age of Onset  . Heart attack Father 50  . Hypertension Father   . Cancer Mother        Breast cancer  . Cancer Other        family hx of  . Coronary artery disease Other        family hx of  . Hyperlipidemia Other        family hx of  . Hypertension Paternal Grandfather   . Healthy Son   . Healthy  Daughter   . Stroke Neg Hx   . Colon cancer Neg Hx    Past Surgical History:  Procedure Laterality Date  . CORONARY ARTERY BYPASS GRAFT  2002  . CORONARY ARTERY BYPASS GRAFT  08-2010   L-LAD remained from original CABG; new grafts incl L radial- PDA + RIMA-RI  . VASECTOMY     Social History   Social History Narrative   Single   Education: College   Exercise: Yes   Immunization History  Administered Date(s) Administered  . Hepatitis B 09/20/2000  . Influenza Split 05/14/2015, 06/11/2016  . Influenza,inj,Quad PF,6+ Mos 04/04/2017, 04/15/2018, 05/05/2019, 04/20/2020  . Influenza-Unspecified 04/04/2017  . PFIZER(Purple Top)SARS-COV-2 Vaccination 10/24/2019, 11/17/2019, 06/09/2020  . Tdap 02/11/2017  . Zoster 08/13/2013     Objective: Vital Signs: BP 128/75 (BP Location: Left Arm, Patient Position: Sitting, Cuff Size: Normal)   Pulse 60   Resp 16   Ht 5\' 8"  (1.727 m)   Wt 185 lb 9.6 oz (84.2 kg)   BMI 28.22 kg/m    Physical Exam Vitals and nursing note reviewed.  Constitutional:      Appearance: He is well-developed.  HENT:     Head: Normocephalic and atraumatic.  Eyes:     Conjunctiva/sclera: Conjunctivae normal.     Pupils: Pupils are equal, round, and reactive to light.  Cardiovascular:     Rate and Rhythm: Normal rate and regular rhythm.     Heart sounds: Normal heart sounds.  Pulmonary:     Effort: Pulmonary effort is normal.     Breath sounds: Normal breath sounds.  Abdominal:     General: Bowel sounds are normal.     Palpations: Abdomen is soft.  Musculoskeletal:     Cervical back: Normal range of motion and neck supple.  Skin:    General: Skin is warm and dry.     Capillary Refill: Capillary refill takes less than 2 seconds.  Neurological:     Mental Status: He is alert and oriented to person, place, and time.  Psychiatric:        Behavior: Behavior normal.      Musculoskeletal Exam: C-spine thoracic and lumbar spine with good range of motion.   Shoulder joints, elbow joints, wrist joints, MCPs PIPs and DIPs with good range of motion.  He has bilateral PIP and DIP thickening.  Hip joints, knee joints, ankles with good range of motion.  He had no tenderness over ankles or MTPs.   CDAI Exam: CDAI Score: -- Patient Global: --; Provider Global: -- Swollen: --; Tender: -- Joint Exam 12/14/2020   No joint exam has been documented for this visit   There is currently no information documented on the homunculus. Go to the Rheumatology activity and complete the homunculus joint exam.  Investigation: No additional findings.  Imaging: ECHOCARDIOGRAM COMPLETE  Result Date: 11/30/2020    ECHOCARDIOGRAM REPORT   Patient Name:   KAREN KINNARD Date of Exam: 11/30/2020 Medical Rec #:  [id number]      Height:       68.0 in Accession #:    [id number]     Weight:       183.2 lb Date of Birth:  [birth year]      BSA:          1.969 m Patient Age:    63 years       BP:           112/86 mmHg Patient Gender: M              HR:           59 bpm. Exam Location:  [facility] Procedure: 2D Echo, Cardiac Doppler and Color Doppler Indications:    I50.32 CHF  History:        Patient has prior history of Echocardiogram examinations, most                 recent 10/08/2018. CHF, Prior CABG, Arrythmias:Atrial                 Fibrillation; Risk Factors:Former Smoker.  Sonographer:    Coralyn Helling RDCS Referring Phys: [place]  1. Left ventricular ejection fraction, by estimation, is 55 to 60%. The left ventricle has normal function. The left ventricle has no regional wall motion abnormalities. Left ventricular diastolic parameters are consistent with Grade II diastolic dysfunction (pseudonormalization). Elevated left atrial pressure.  2. Right ventricular systolic function is mildly reduced. The right ventricular size is normal. Tricuspid regurgitation signal is inadequate for assessing PA pressure.  3. The mitral valve is normal in structure. No  evidence of mitral valve regurgitation. No evidence of mitral stenosis.  4. The aortic valve is normal in structure. Aortic valve regurgitation is not visualized. No aortic stenosis is present.  5. The inferior vena cava is normal in size with greater than 50% respiratory variability, suggesting right atrial pressure of 3 mmHg. Comparison(s): No significant change from prior study. Prior images reviewed side by side. FINDINGS  Left Ventricle: Left ventricular ejection fraction, by estimation, is 55 to 60%. The left ventricle has normal function. The left ventricle has no regional wall motion abnormalities. The left ventricular internal cavity size was normal in size. There is  no left ventricular hypertrophy. Abnormal (paradoxical) septal motion consistent with post-operative status. Left ventricular diastolic parameters are consistent with Grade II diastolic dysfunction (pseudonormalization). Elevated left atrial pressure. Right Ventricle: The right ventricular size is normal. No increase in right ventricular wall thickness. Right ventricular systolic function is mildly reduced. Tricuspid regurgitation signal is inadequate for assessing PA pressure. Left Atrium: Left atrial size was normal in size. Right Atrium: Right atrial size was normal in size. Pericardium: There is no evidence of pericardial effusion. Mitral Valve: The mitral valve is normal in structure. Mild mitral annular calcification. No evidence of mitral valve regurgitation. No evidence of mitral valve stenosis. Tricuspid Valve: The tricuspid valve is normal in structure. Tricuspid valve regurgitation is not demonstrated. No evidence of tricuspid stenosis. Aortic Valve: The aortic valve is normal in structure. Aortic valve regurgitation is not visualized. No aortic stenosis is present. Pulmonic Valve: The pulmonic valve was normal in structure. Pulmonic valve regurgitation is mild. No evidence of pulmonic stenosis. Aorta: The aortic root is normal in  size and structure. Venous: The inferior vena cava is normal in size with greater than 50% respiratory variability, suggesting right atrial pressure of 3 mmHg. IAS/Shunts: No atrial level shunt detected by color  flow Doppler.  LEFT VENTRICLE PLAX 2D LVIDd:         3.90 cm  Diastology LVIDs:         2.70 cm  LV e' medial:    5.77 cm/s LV PW:         1.10 cm  LV E/e' medial:  26.0 LV IVS:        1.10 cm  LV e' lateral:   12.10 cm/s LVOT diam:     2.00 cm  LV E/e' lateral: 12.4 LV SV:         75 LV SV Index:   38 LVOT Area:     3.14 cm  RIGHT VENTRICLE             IVC RV S prime:     10.70 cm/s  IVC diam: 1.50 cm TAPSE (M-mode): 1.4 cm LEFT ATRIUM             Index       RIGHT ATRIUM           Index LA diam:        3.90 cm 1.98 cm/m  RA Pressure: 3.00 mmHg LA Vol (A2C):   81.2 ml 41.24 ml/m RA Area:     15.40 cm LA Vol (A4C):   56.2 ml 28.54 ml/m RA Volume:   38.80 ml  19.71 ml/m LA Biplane Vol: 67.6 ml 34.33 ml/m  AORTIC VALVE LVOT Vmax:   100.00 cm/s LVOT Vmean:  70.900 cm/s LVOT VTI:    0.238 m  AORTA Ao Root diam: 3.40 cm Ao Asc diam:  3.70 cm MV E velocity: 150.00 cm/s  TRICUSPID VALVE MV A velocity: 73.70 cm/s   Estimated RAP:  3.00 mmHg MV E/A ratio:  2.04                             SHUNTS                             Systemic VTI:  0.24 m                             Systemic Diam: 2.00 cm Dani Gobble Croitoru MD Electronically signed by Sanda Klein MD Signature Date/Time: 11/30/2020/12:39:23 PM    Final     Recent Labs: Lab Results  Component Value Date   WBC 8.4 12/08/2020   HGB 15.2 12/08/2020   PLT 175 12/08/2020   NA 138 12/08/2020   K 4.5 12/08/2020   CL 101 12/08/2020   CO2 30 12/08/2020   GLUCOSE 94 12/08/2020   BUN 21 12/08/2020   CREATININE 1.33 (H) 12/08/2020   BILITOT 0.5 12/08/2020   ALKPHOS 71 11/02/2020   AST 18 12/08/2020   ALT 19 12/08/2020   PROT 6.8 12/08/2020   ALBUMIN 5.1 (H) 11/02/2020   CALCIUM 9.5 12/08/2020   GFRAA 65 12/08/2020    Speciality Comments: No  specialty comments available.  Procedures:  No procedures performed Allergies: Patient has no known allergies.   Assessment / Plan:     Visit Diagnoses: Idiopathic chronic gout of multiple sites without tophus - colchicine 1 tablet p.o. daily and allopurinol 450 mg p.o. daily. uric acid: December 08, 2020 uric acid 5.7.  He had 1 mild flare which she relates to drinking alcohol.  He has done some dietary modifications.  We had  detailed discussion regarding dietary modifications and also decreasing weight and alcohol.- Plan: Uric acid prior to his next visit.  Medication monitoring encounter - Plan: CBC with Differential/Platelet, COMPLETE METABOLIC PANEL WITH GFR prior to the next visit.  Primary osteoarthritis of both hands-joint protection muscle strengthening was discussed.  Primary osteoarthritis of both feet-he currently is not having any discomfort  DDD (degenerative disc disease), lumbar.-He has history of chronic lower back pain.  I placed exercises in the AVS.  Core strengthening was emphasized.  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  CKD (chronic kidney disease) stage 2, GFR 60-89 ml/min-I advised him not to take naproxen.  It was written in his medication list.  Atherosclerosis of coronary artery bypass graft of native heart without angina pectoris-followed by cardiology.  Chronic diastolic CHF (congestive heart failure) (H[facility])  History of atrial fibrillation  History of hyperlipidemia  Other insomnia  Orders: Orders Placed This Encounter  Procedures  . CBC with Differential/Platelet  . COMPLETE METABOLIC PANEL WITH GFR  . Uric acid   No orders of the defined types were placed in this encounter.    Follow-Up Instructions: Return in about 6 months (around 06/16/2021) for Gout.   Bo Merino, MD  Note - This record has been created using Editor, commissioning.  Chart creation errors have been sought, but may not always  have been  located. Such creation errors do not reflect on  the standard of medical care.

## 2020-12-06 ENCOUNTER — Other Ambulatory Visit: Payer: Self-pay | Admitting: Physician Assistant

## 2020-12-08 ENCOUNTER — Other Ambulatory Visit: Payer: Self-pay

## 2020-12-08 DIAGNOSIS — M79642 Pain in left hand: Secondary | ICD-10-CM

## 2020-12-08 DIAGNOSIS — M19042 Primary osteoarthritis, left hand: Secondary | ICD-10-CM

## 2020-12-08 DIAGNOSIS — M19041 Primary osteoarthritis, right hand: Secondary | ICD-10-CM

## 2020-12-08 DIAGNOSIS — M19072 Primary osteoarthritis, left ankle and foot: Secondary | ICD-10-CM

## 2020-12-08 DIAGNOSIS — M79641 Pain in right hand: Secondary | ICD-10-CM

## 2020-12-08 DIAGNOSIS — M19071 Primary osteoarthritis, right ankle and foot: Secondary | ICD-10-CM

## 2020-12-08 DIAGNOSIS — M5136 Other intervertebral disc degeneration, lumbar region: Secondary | ICD-10-CM

## 2020-12-08 DIAGNOSIS — M1A09X0 Idiopathic chronic gout, multiple sites, without tophus (tophi): Secondary | ICD-10-CM

## 2020-12-08 LAB — COMPLETE METABOLIC PANEL WITH GFR
AG Ratio: 2.1 (calc) (ref 1.0–2.5)
ALT: 19 U/L (ref 9–46)
AST: 18 U/L (ref 10–35)
Albumin: 4.6 g/dL (ref 3.6–5.1)
Alkaline phosphatase (APISO): 54 U/L (ref 35–144)
BUN/Creatinine Ratio: 16 (calc) (ref 6–22)
BUN: 21 mg/dL (ref 7–25)
CO2: 30 mmol/L (ref 20–32)
Calcium: 9.5 mg/dL (ref 8.6–10.3)
Chloride: 101 mmol/L (ref 98–110)
Creat: 1.33 mg/dL — ABNORMAL HIGH (ref 0.70–1.25)
GFR, Est African American: 65 mL/min/{1.73_m2} (ref >=60)
GFR, Est Non African American: 56 mL/min/{1.73_m2} — ABNORMAL LOW (ref >=60)
Globulin: 2.2 g/dL (calc) (ref 1.9–3.7)
Glucose, Bld: 94 mg/dL (ref 65–99)
Potassium: 4.5 mmol/L (ref 3.5–5.3)
Sodium: 138 mmol/L (ref 135–146)
Total Bilirubin: 0.5 mg/dL (ref 0.2–1.2)
Total Protein: 6.8 g/dL (ref 6.1–8.1)

## 2020-12-08 LAB — CBC WITH DIFFERENTIAL/PLATELET
Absolute Monocytes: 1512 cells/uL — ABNORMAL HIGH (ref 200–950)
Basophils Absolute: 50 cells/uL (ref 0–200)
Basophils Relative: 0.6 %
Eosinophils Absolute: 168 cells/uL (ref 15–500)
Eosinophils Relative: 2 %
HCT: 45.1 % (ref 38.5–50.0)
Hemoglobin: 15.2 g/dL (ref 13.2–17.1)
Lymphs Abs: 1856 cells/uL (ref 850–3900)
MCH: 31.8 pg (ref 27.0–33.0)
MCHC: 33.7 g/dL (ref 32.0–36.0)
MCV: 94.4 fL (ref 80.0–100.0)
MPV: 10.9 fL (ref 7.5–12.5)
Monocytes Relative: 18 %
Neutro Abs: 4813 cells/uL (ref 1500–7800)
Neutrophils Relative %: 57.3 %
Platelets: 175 10*3/uL (ref 140–400)
RBC: 4.78 10*6/uL (ref 4.20–5.80)
RDW: 12.5 % (ref 11.0–15.0)
Total Lymphocyte: 22.1 %
WBC: 8.4 10*3/uL (ref 3.8–10.8)

## 2020-12-08 LAB — URIC ACID: Uric Acid, Serum: 5.7 mg/dL (ref 4.0–8.0)

## 2020-12-09 NOTE — Progress Notes (Signed)
Uric acid is in desirable range.  Creatinine is mildly elevated, better than the previous value.  CBC is normal.

## 2020-12-14 ENCOUNTER — Encounter: Payer: Self-pay | Admitting: Rheumatology

## 2020-12-14 ENCOUNTER — Other Ambulatory Visit: Payer: Self-pay

## 2020-12-14 ENCOUNTER — Ambulatory Visit: Payer: BC Managed Care – PPO | Admitting: Rheumatology

## 2020-12-14 VITALS — BP 128/75 | HR 60 | Resp 16 | Ht 68.0 in | Wt 185.6 lb

## 2020-12-14 DIAGNOSIS — M19072 Primary osteoarthritis, left ankle and foot: Secondary | ICD-10-CM

## 2020-12-14 DIAGNOSIS — I25810 Atherosclerosis of coronary artery bypass graft(s) without angina pectoris: Secondary | ICD-10-CM

## 2020-12-14 DIAGNOSIS — Z5181 Encounter for therapeutic drug level monitoring: Secondary | ICD-10-CM

## 2020-12-14 DIAGNOSIS — Z8679 Personal history of other diseases of the circulatory system: Secondary | ICD-10-CM

## 2020-12-14 DIAGNOSIS — M5136 Other intervertebral disc degeneration, lumbar region: Secondary | ICD-10-CM

## 2020-12-14 DIAGNOSIS — Z8639 Personal history of other endocrine, nutritional and metabolic disease: Secondary | ICD-10-CM

## 2020-12-14 DIAGNOSIS — M19042 Primary osteoarthritis, left hand: Secondary | ICD-10-CM

## 2020-12-14 DIAGNOSIS — M19071 Primary osteoarthritis, right ankle and foot: Secondary | ICD-10-CM | POA: Diagnosis not present

## 2020-12-14 DIAGNOSIS — M1A09X0 Idiopathic chronic gout, multiple sites, without tophus (tophi): Secondary | ICD-10-CM

## 2020-12-14 DIAGNOSIS — G4709 Other insomnia: Secondary | ICD-10-CM

## 2020-12-14 DIAGNOSIS — I5032 Chronic diastolic (congestive) heart failure: Secondary | ICD-10-CM

## 2020-12-14 DIAGNOSIS — M51369 Other intervertebral disc degeneration, lumbar region without mention of lumbar back pain or lower extremity pain: Secondary | ICD-10-CM

## 2020-12-14 DIAGNOSIS — N182 Chronic kidney disease, stage 2 (mild): Secondary | ICD-10-CM

## 2020-12-14 DIAGNOSIS — M19041 Primary osteoarthritis, right hand: Secondary | ICD-10-CM | POA: Diagnosis not present

## 2020-12-14 NOTE — Patient Instructions (Signed)

## 2020-12-23 ENCOUNTER — Other Ambulatory Visit: Payer: Self-pay | Admitting: Cardiovascular Disease

## 2021-01-02 ENCOUNTER — Other Ambulatory Visit: Payer: Self-pay | Admitting: Nephrology

## 2021-01-02 DIAGNOSIS — N1831 Chronic kidney disease, stage 3a: Secondary | ICD-10-CM

## 2021-02-02 ENCOUNTER — Ambulatory Visit
Admission: RE | Admit: 2021-02-02 | Discharge: 2021-02-02 | Disposition: A | Discharge status: Home / Self Care | Payer: BC Managed Care – PPO | Source: Ambulatory Visit | Attending: Nephrology | Admitting: Nephrology

## 2021-02-02 DIAGNOSIS — N1831 Chronic kidney disease, stage 3a: Secondary | ICD-10-CM

## 2021-03-14 ENCOUNTER — Other Ambulatory Visit: Payer: Self-pay | Admitting: Physician Assistant

## 2021-03-14 NOTE — Telephone Encounter (Signed)
Next Visit: 06/16/2021  Last Visit: 12/14/2020  Last Fill: 06/27/2020  DX: Idiopathic chronic gout of multiple sites without tophus   Current Dose per office note 12/14/2020: allopurinol 450 mg p.o. daily  Labs: 12/08/2020, Uric acid is in desirable range.  Creatinine is mildly elevated, better thanthe previous value.  CBC is normal.  Okay to refill Allopurinil?

## 2021-03-20 ENCOUNTER — Other Ambulatory Visit: Payer: Self-pay | Admitting: Family Medicine

## 2021-03-20 DIAGNOSIS — Z113 Encounter for screening for infections with a predominantly sexual mode of transmission: Secondary | ICD-10-CM

## 2021-03-20 NOTE — Telephone Encounter (Signed)
LFD 07/28/21 #90 with 1 refill LOV 05/23/20 NOV none

## 2021-03-21 ENCOUNTER — Other Ambulatory Visit: Payer: Self-pay | Admitting: Interventional Cardiology

## 2021-03-21 NOTE — Telephone Encounter (Signed)
Due for follow up visit. Will refill once.  Lab Results  Component Value Date   CREATININE 1.33 (H) 12/08/2020  Egfr 56

## 2021-03-24 ENCOUNTER — Other Ambulatory Visit: Payer: Self-pay

## 2021-03-24 ENCOUNTER — Telehealth: Payer: BC Managed Care – PPO | Admitting: Registered Nurse

## 2021-03-24 DIAGNOSIS — R109 Unspecified abdominal pain: Secondary | ICD-10-CM

## 2021-03-24 MED ORDER — PANTOPRAZOLE SODIUM 40 MG PO TBEC: 40.0000 mg | DELAYED_RELEASE_TABLET | Freq: Every day | ORAL | 3 refills | Status: DC

## 2021-03-24 MED ORDER — DICYCLOMINE HCL 10 MG PO CAPS: 10.0000 mg | ORAL_CAPSULE | Freq: Three times a day (TID) | ORAL | 0 refills | Status: DC

## 2021-03-24 NOTE — Progress Notes (Signed)
Telemedicine Encounter- SOAP NOTE Established Patient  This video encounter was conducted with the patient's (or proxy's) verbal consent via audio telecommunications: yes/no: Yes Patient was instructed to have this encounter in a suitably private space; and to only have persons present to whom they give permission to participate. In addition, patient identity was confirmed by use of name plus two identifiers (DOB and address).  I discussed the limitations, risks, security and privacy concerns of performing an evaluation and management service by telephone and the availability of in person appointments. I also discussed with the patient that there may be a patient responsible charge related to this service. The patient expressed understanding and agreed to proceed.  I spent a total of 22 minutes talking with the patient or their proxy.  Patient at home Provider in office  Participants: Kathrin Ruddy, NP and [name]  Chief Complaint  Patient presents with   GI Problem    Patient states he has been having some stomach cramping , loose stools , patient states the pain comes and goes for 2 weeks. Patient states he took some anti     Subjective   Jeremy Hendricks is a 65 y.o. established patient. Video visit today for GI concerns  HPI Traveled to [place], returned around 2 weeks ago Driving trip Notes GI - diarrhea (fairly severe consistent) Nasal symptoms - congestion, cold/flu like symptoms - since resolved Negative COVID test  Taken imodium - led to some transient constipation for 1-2 days  Intermittent symptoms since. Occ cramping. Irregular BM Typically one BM in mornings, had been very consistent previous to this  Notes his first wife had passed from colon ca - dx in July, passed by November a number of years ago.  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 s/p CABG 2002. b. s/p redo 2012.   DJD (degenerative joint disease)    Gout    History of ETT    a. ETT 6/16:  normal    Current Outpatient Medications  Medication Sig Dispense Refill   allopurinol (ZYLOPRIM) 300 MG tablet TAKE 1&1/2 TABLETS BY MOUTH DAILY 135 tablet 0   atenolol (TENORMIN) 25 MG tablet TAKE 1 TABLET EVERY DAY. KEEP APPOINTMENT. 90 tablet 0   Colchicine 0.6 MG CAPS TAKE 1 TABLETS ON FIRST DAY OF FLARE UP. THEN TAKE 1 DAILY UNTIL FLARE UP RESOLVED 8 capsule 2   dicyclomine (BENTYL) 10 MG capsule Take 1 capsule (10  mg total) by mouth 4 (four) times daily -  before meals and at bedtime. 30 capsule 0   emtricitabine-tenofovir (TRUVADA) 200-300 MG tablet TAKE ONE TABLET BY MOUTH ONCE DAILY WITH OR WITHOUT FOOD. STORE IN ORIGINAL CONTAINER AT ROOM TEMPERATURE. 90 tablet 0   furosemide (LASIX) 40 MG tablet TAKE 1 TABLET BY MOUTH DAILY. MAY TAKE AN ADDITIONAL 1/2 TABLET AS NEEDED FOR SWELLING. 135 tablet 3   Multiple Vitamin (MULTIVITAMIN) tablet Take 1 tablet by mouth daily.     nitroGLYCERIN (NITROSTAT) 0.4 MG SL tablet Place 0.4 mg under  the tongue every 5 (five) minutes as needed for chest pain. Reported on 09/21/2015     pantoprazole (PROTONIX) 40 MG tablet Take 1 tablet (40 mg total) by mouth daily. 30 tablet 3   rosuvastatin (CRESTOR) 40 MG tablet TAKE 1 TABLET BY MOUTH EVERY DAY 90 tablet 3   Testosterone 30 MG/ACT SOLN APPLY 1 PUMP EVERY DAY 90 mL 0   No current facility-administered medications for this visit.    No Known Allergies  Social History   Socioeconomic History   Marital status: Single    Spouse name: Not on file   Number of children: 2   Years of education: Not on file   Highest education level: Not on file  Occupational History   Occupation: Realtor  Tobacco Use   Smoking status: Former    Types: Cigarettes    Quit date: 08/14/1999    Years since quitting: 21.7   Smokeless tobacco: Never  Vaping Use   Vaping Use: Never used  Substance and Sexual Activity   Alcohol use: Yes    Comment: 1oz-2oz daily    Drug use: No   Sexual activity: Yes  Other Topics Concern   Not on file  Social History Narrative   Single   Education: Secretary/administrator   Exercise: Yes   Social Determinants of Health   Financial Resource Strain: Not on file  Food Insecurity: Not on file  Transportation Needs: Not on file  Physical Activity: Not on file  Stress: Not on file  Social Connections: Not on file  Intimate Partner Violence: Not on file    ROS  Objective   Vitals as reported by the patient: There were no vitals filed for this visit.  Eulie was seen today for gi problem.  Diagnoses and all orders for this visit:  Abdominal cramping -     pantoprazole (PROTONIX) 40 MG tablet; Take 1 tablet (40 mg total) by mouth daily. -     dicyclomine (BENTYL) 10 MG capsule; Take 1 capsule (10 mg total) by mouth 4 (four) times daily -  before meals and at bedtime.  PLAN Fortunately no red flags for patient at this time. Likely some GI upset resulting from imodium and abx use.  Can try pantoprazole for any GERD, bentyl  for cramping. Advised on reasons to return to clinic. Patient encouraged to call clinic with any questions, comments, or concerns.   I discussed the assessment and treatment plan with the patient. The patient was provided an opportunity to ask questions and all were answered. The patient agreed with the plan and demonstrated an understanding of the instructions.   The patient was advised to call back or seek an in-person evaluation if the symptoms worsen or if the condition fails to improve as anticipated.  I provided 22 minutes of face-to-face time during this encounter.  Maximiano Coss, NP  Primary Care at [facility]

## 2021-03-24 NOTE — Patient Instructions (Signed)
° ° ° °  If you have lab work done today you will be contacted with your lab results within the next 2 weeks.  If you have not heard from us then please contact us. The fastest way to get your results is to register for My Chart. ° ° °IF you received an x-ray today, you will receive an invoice from [facility]. Please contact [facility] at [phone] with questions or concerns regarding your invoice.  ° °IF you received labwork today, you will receive an invoice from [facility]. Please contact [facility] at [phone] with questions or concerns regarding your invoice.  ° °Our billing staff will not be able to assist you with questions regarding bills from these companies. ° °You will be contacted with the lab results as soon as they are available. The fastest way to get your results is to activate your My Chart account. Instructions are located on the last page of this paperwork. If you have not heard from us regarding the results in 2 weeks, please contact this office. °  ° ° ° °

## 2021-05-16 ENCOUNTER — Telehealth: Payer: BC Managed Care – PPO | Admitting: Physician Assistant

## 2021-05-16 ENCOUNTER — Telehealth: Payer: BC Managed Care – PPO

## 2021-05-16 DIAGNOSIS — J069 Acute upper respiratory infection, unspecified: Secondary | ICD-10-CM

## 2021-05-16 DIAGNOSIS — H9203 Otalgia, bilateral: Secondary | ICD-10-CM | POA: Diagnosis not present

## 2021-05-16 MED ORDER — AMOXICILLIN 875 MG PO TABS: 875.0000 mg | ORAL_TABLET | Freq: Two times a day (BID) | ORAL | 0 refills | Status: AC

## 2021-05-16 NOTE — Patient Instructions (Signed)
  Jeremy Hendricks, thank you for joining [name], PA-C for today's virtual visit.  While this provider is not your primary care provider (PCP), if your PCP is located in our provider database this encounter information will be shared with them immediately following your visit.  Consent: (Patient) Jeremy Hendricks provided verbal consent for this virtual visit at the beginning of the encounter.  Current Medications:  Current Outpatient Medications:    amoxicillin (AMOXIL) 875 MG tablet, Take 1 tablet (875 mg total) by mouth 2 (two) times daily for 10 days., Disp: 20 tablet, Rfl: 0   allopurinol (ZYLOPRIM) 300 MG tablet, TAKE 1&1/2 TABLETS BY MOUTH DAILY, Disp: 135 tablet, Rfl: 0   atenolol (TENORMIN) 25 MG tablet, TAKE 1 TABLET EVERY DAY. KEEP APPOINTMENT., Disp: 90 tablet, Rfl: 0   Colchicine 0.6 MG CAPS, TAKE 1 TABLETS ON FIRST DAY OF FLARE UP. THEN TAKE 1 DAILY UNTIL FLARE UP RESOLVED, Disp: 8 capsule, Rfl: 2   emtricitabine-tenofovir (TRUVADA) 200-300 MG tablet, TAKE ONE TABLET BY MOUTH ONCE DAILY WITH OR WITHOUT FOOD. STORE IN ORIGINAL CONTAINER AT ROOM TEMPERATURE., Disp: 90 tablet, Rfl: 0   furosemide (LASIX) 40 MG tablet, TAKE 1 TABLET BY MOUTH DAILY. MAY TAKE AN ADDITIONAL 1/2 TABLET AS NEEDED FOR SWELLING., Disp: 135 tablet, Rfl: 3   Multiple Vitamin (MULTIVITAMIN) tablet, Take 1 tablet by mouth daily., Disp: , Rfl:    nitroGLYCERIN (NITROSTAT) 0.4 MG SL tablet, Place 0.4 mg under the tongue every 5 (five) minutes as needed for chest pain. Reported on 09/21/2015, Disp: , Rfl:    rosuvastatin (CRESTOR) 40 MG tablet, TAKE 1 TABLET BY MOUTH EVERY DAY, Disp: 90 tablet, Rfl: 3   Testosterone 30 MG/ACT SOLN, APPLY 1 PUMP EVERY DAY, Disp: 90 mL, Rfl: 0   Medications ordered in this encounter:  Meds ordered this encounter  Medications   amoxicillin (AMOXIL) 875 MG tablet    Sig: Take 1 tablet (875 mg total) by mouth 2 (two) times daily for 10 days.    Dispense:  20 tablet    Refill:   0    Order Specific Question:   Supervising Provider    Answer:   Sabra Heck, BRIAN [[id number]]     *If you need refills on other medications prior to your next appointment, please contact your pharmacy*  Follow-Up: Call back or seek an in-person evaluation if the symptoms worsen or if the condition fails to improve as anticipated.  Other Instructions Please keep well-hydrated and get plenty of rest. Can continue Tylenol for ear pain. As discussed, start a saline nasal rinse and an OTC nasal steroid like Flonase or Nasacort. Run a humidifier in the bedroom. Salt-water gargles may also be beneficial. Take the antibiotic as directed with food.    If you have been instructed to have an in-person evaluation today at a local Urgent Care facility, please use the link below. It will take you to a list of all of our available [facility], including address, phone number and hours of operation. Please do not delay care.  [facility]  If you or a family member do not have a primary care provider, use the link below to schedule a visit and establish care. When you choose a [facility] primary care physician or advanced practice provider, you gain a long-term partner in health. Find a Primary Care Provider  Learn more about [facility] in-office and virtual care options: [facility]

## 2021-05-16 NOTE — Progress Notes (Signed)
Virtual Visit Consent   Jeremy Hendricks, you are scheduled for a virtual visit with a [facility] provider today.     Just as with appointments in the office, your consent must be obtained to participate.  Your consent will be active for this visit and any virtual visit you may have with one of our providers in the next 365 days.     If you have a MyChart account, a copy of this consent can be sent to you electronically.  All virtual visits are billed to your insurance company just like a traditional visit in the office.    As this is a virtual visit, video technology does not allow for your provider to perform a traditional examination.  This may limit your provider's ability to fully assess your condition.  If your provider identifies any concerns that need to be evaluated in person or the need to arrange testing (such as labs, EKG, etc.), we will make arrangements to do so.     Although advances in technology are sophisticated, we cannot ensure that it will always work on either your end or our end.  If the connection with a video visit is poor, the visit may have to be switched to a telephone visit.  With either a video or telephone visit, we are not always able to ensure that we have a secure connection.     I need to obtain your verbal consent now.   Are you willing to proceed with your visit today?    Jeremy Hendricks has provided verbal consent on 05/16/2021 for a virtual visit (video or telephone).   [name], [place]   Date: 05/16/2021 11:21 AM   Virtual Visit via Video Note   I, [name], connected with  Jeremy Hendricks  ([id number], 03/13/1956) on 05/16/21 at 11:15 AM EDT by a video-enabled telemedicine application and verified that I am speaking with the correct person using two identifiers.  Location: Patient: Virtual Visit Location Patient: Home Provider: Virtual Visit Location Provider: Home Office   I discussed the limitations of evaluation and management by  telemedicine and the availability of in person appointments. The patient expressed understanding and agreed to proceed.    History of Present Illness: Jeremy Hendricks is a 65 y.o. who identifies as a male who was assigned male at birth, and is being seen today for ongoing and worsening sore throat with bilateral ear pain. Denies fevers, chills, body aches. Denies any significant chest congestion. Occasional PND but not recently. Unable to see the back of his throat clearly Denies dysphagia but notes odynophagia. Ear pain is consistent and achy in nature. Denies drainage from the ear. Denies recent travel or sick contact. Is taking Tylenol OTC to help somewhat with pain.   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   HLD (hyperlipidemia) 06/11/2016   Hypogonadism in male 09/09/2015   INSOMNIA 09/19/2010   SHORTNESS OF BREATH 09/19/2010   CHEST PAIN-PRECORDIAL 08/23/2010   CAD, ARTERY BYPASS GRAFT 10/26/2009   HYPERLIPIDEMIA 10/24/2009   DEGENERATIVE JOINT DISEASE  10/24/2009   ANGINA, HX OF 10/24/2009    Allergies: No Known Allergies Medications:  Current Outpatient Medications:    amoxicillin (AMOXIL) 875 MG tablet, Take 1 tablet (875 mg total) by mouth 2 (two) times daily for 10 days., Disp: 20 tablet, Rfl: 0   allopurinol (ZYLOPRIM) 300 MG tablet, TAKE 1&1/2 TABLETS BY MOUTH DAILY, Disp: 135 tablet, Rfl: 0   atenolol (TENORMIN) 25 MG tablet, TAKE 1 TABLET EVERY DAY. KEEP APPOINTMENT., Disp: 90 tablet, Rfl: 0   Colchicine 0.6 MG CAPS, TAKE 1 TABLETS ON FIRST DAY OF FLARE UP. THEN TAKE 1 DAILY UNTIL FLARE UP RESOLVED, Disp: 8  capsule, Rfl: 2   emtricitabine-tenofovir (TRUVADA) 200-300 MG tablet, TAKE ONE TABLET BY MOUTH ONCE DAILY WITH OR WITHOUT FOOD. STORE IN ORIGINAL CONTAINER AT ROOM TEMPERATURE., Disp: 90 tablet, Rfl: 0   furosemide (LASIX) 40 MG tablet, TAKE 1 TABLET BY MOUTH DAILY. MAY TAKE AN ADDITIONAL 1/2 TABLET AS NEEDED FOR SWELLING., Disp: 135 tablet, Rfl: 3   Multiple Vitamin (MULTIVITAMIN) tablet, Take 1 tablet by mouth daily., Disp: , Rfl:    nitroGLYCERIN (NITROSTAT) 0.4 MG SL tablet, Place 0.4 mg under the tongue every 5 (five) minutes as needed for chest pain. Reported on 09/21/2015, Disp: , Rfl:    rosuvastatin (CRESTOR) 40 MG tablet, TAKE 1 TABLET BY MOUTH EVERY DAY, Disp: 90 tablet, Rfl: 3   Testosterone 30 MG/ACT SOLN, APPLY 1 PUMP EVERY DAY, Disp: 90 mL, Rfl: 0  Observations/Objective: Patient is well-developed, well-nourished in no acute distress.  Resting comfortably at home.  Head is normocephalic, atraumatic.  No labored breathing. Speech is clear and coherent with logical content.  Patient is alert and oriented at baseline.   Assessment and Plan: 1. Viral URI with cough Few days of symptoms. Afebrile. No alarm signs/symptoms. Reviewed supportive measures and OTC medications with patient. Concern for secondary AOM -- treatment outlined below.   2. Acute otalgia, bilateral - amoxicillin (AMOXIL) 875 MG tablet; Take 1 tablet (875 mg total) by mouth 2 (two) times daily for 10 days.  Dispense: 20 tablet; Refill: 0 Continue the aforementioned measures. Will add on Amoxicillin BID x 10 days giving current symptoms and history.   Follow Up Instructions: I discussed the assessment and treatment plan with the patient. The patient was provided an opportunity to ask questions and all were answered. The patient agreed with the plan and demonstrated an understanding of the instructions.  A copy of instructions were sent to the patient via MyChart unless otherwise noted below.   The patient was  advised to call back or seek an in-person evaluation if the symptoms worsen or if the condition fails to improve as anticipated.  Time:  I spent 12 minutes with the patient via telehealth technology discussing the above problems/concerns.    [name], PA-C

## 2021-06-05 NOTE — Progress Notes (Signed)
Office Visit Note  Patient: Jeremy Hendricks             Date of Birth: February 05, 1956           MRN: [id number]             PCP: Wendie Agreste, MD Referring: Wendie Agreste, MD Visit Date: 06/16/2021 Occupation: @GUAROCC @  Subjective:  Left foot pain and swelling.   History of Present Illness: Jeremy Hendricks is a 65 y.o. male with a history of gout, osteoarthritis and degenerative disc disease.  He states that he decreased his dose of Lasix to half.  After that he decided to decrease his dose of allopurinol.  He states for the last 6 months he has been taking allopurinol only half a tablet (150 mg).  He states he did well for a while.  Per week and a half ago he started having pain and discomfort in his bilateral feet which he describes over the lateral aspect of his feet.  He started taking colchicine.  He also took 2 tablets of prednisone which she had at home.  He states the pain has improved although he still have some discomfort in the lateral aspect of his left foot.  None of the other joints are painful or swollen.  He also increased alcohol consumption which could have contributed to his symptoms.  ADLs: Morning stiffness 10 minutes Reports nocturnal pain.  Difficulty dressing/grooming: Denies Difficulty climbing stairs: Denies Difficulty getting out of chair: Denies Difficulty using hands for taps, buttons, cutlery, and/or writing: Denies  Review of Systems  Constitutional:  Negative for fatigue and night sweats.  HENT:  Negative for mouth sores, mouth dryness and nose dryness.   Eyes:  Negative for redness and dryness.  Respiratory:  Negative for shortness of breath and difficulty breathing.   Cardiovascular:  Negative for chest pain, palpitations, hypertension, irregular heartbeat and swelling in legs/feet.  Gastrointestinal:  Negative for constipation and diarrhea.  Endocrine: Negative for increased urination.  Musculoskeletal:  Positive for joint pain, joint pain and  morning stiffness. Negative for joint swelling, myalgias, muscle weakness, muscle tenderness and myalgias.  Skin:  Negative for color change, rash, hair loss, nodules/bumps, skin tightness, ulcers and sensitivity to sunlight.  Allergic/Immunologic: Negative for susceptible to infections.  Neurological:  Negative for dizziness, fainting, memory loss, night sweats and weakness ( ).  Hematological:  Negative for swollen glands.  Psychiatric/Behavioral:  Negative for depressed mood and sleep disturbance. The patient is not nervous/anxious.    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 s/p CABG 2002. b. s/p redo 2012.   DJD (degenerative joint disease)    Gout    History of ETT    a. ETT 6/16:  normal    Family History  Problem Relation Age of Onset   Heart attack Father 108   Hypertension Father    Cancer Mother        Breast cancer   Cancer Other        family hx of   Coronary artery disease Other        family hx of   Hyperlipidemia Other        family hx of   Hypertension Paternal Grandfather    Healthy Son     Healthy Daughter    Stroke Neg Hx    Colon cancer Neg Hx    Past Surgical History:  Procedure Laterality Date   CORONARY ARTERY BYPASS GRAFT  2002   CORONARY ARTERY BYPASS GRAFT  08-2010   L-LAD remained from original CABG; new grafts incl L radial- PDA + RIMA-RI   VASECTOMY     Social History   Social History Narrative   Single   Education: College   Exercise: Yes   Immunization History  Administered Date(s) Administered   Hepatitis B 09/20/2000   Influenza Split 05/14/2015, 06/11/2016   Influenza,inj,Quad PF,6+ Mos 04/04/2017, 04/15/2018, 05/05/2019, 04/20/2020   Influenza-Unspecified 04/04/2017   PFIZER(Purple Top)SARS-COV-2 Vaccination 10/24/2019, 11/17/2019, 06/09/2020   Tdap 02/11/2017   Zoster, Live 08/13/2013     Objective: Vital Signs: BP (!) 142/86 (BP Location: Right Arm, Patient Position: Sitting, Cuff Size: Normal)   Pulse 69   Ht 5\' 7"  (1.702 m)   Wt 188 lb (85.3 kg)   BMI 29.44 kg/m    Physical Exam Vitals and nursing note reviewed.  Constitutional:      Appearance: He is well-developed.  HENT:     Head: Normocephalic and atraumatic.  Eyes:     Conjunctiva/sclera: Conjunctivae normal.     Pupils: Pupils are equal, round, and reactive to light.  Cardiovascular:     Rate and Rhythm: Normal rate and regular rhythm.     Heart sounds: Normal heart sounds.  Pulmonary:     Effort: Pulmonary effort is normal.     Breath sounds: Normal breath sounds.  Abdominal:     General: Bowel sounds are normal.     Palpations: Abdomen is soft.  Musculoskeletal:     Cervical back: Normal range of motion and neck supple.  Skin:    General: Skin is warm and dry.     Capillary Refill: Capillary refill takes less than 2 seconds.     Comments: Nail dystrophy was noted in bilateral toenails.  Neurological:     Mental Status: He is alert and oriented to person, place, and time.  Psychiatric:        Behavior: Behavior normal.     Musculoskeletal Exam: C-spine  thoracic and lumbar spine were in good range of motion.  She he has some discomfort in his right shoulder with abduction over the subacromial region.  Left shoulder joint was in full range of motion.  Elbow joints, wrist joints, MCPs and PIPs with good range of motion.  He had bilateral PIP and DIP thickening with no synovitis.  Hip joints and knee joints in good range of motion.  Hips with range of motion of bilateral ankle joints without any warmth swelling or effusion.  He had tenderness and swelling over  the lateral aspect of his left foot.  There was no tenderness over MTPs.  There was no tenderness over right foot tarsals or metatarsals.  CDAI Exam: CDAI Score: -- Patient Global: --; Provider Global: -- Swollen: --; Tender: -- Joint Exam 06/16/2021   No joint exam has been documented for this visit   There is currently no information documented on the homunculus. Go to the Rheumatology activity and complete the homunculus joint exam.  Investigation: No additional findings.  Imaging: No results found.  Recent Labs: Lab Results  Component Value Date   WBC 8.4 12/08/2020   HGB 15.2 12/08/2020   PLT 175 12/08/2020   NA 138 12/08/2020   K 4.5 12/08/2020   CL 101 12/08/2020   CO2 30 12/08/2020   GLUCOSE 94 12/08/2020   BUN 21 12/08/2020   CREATININE 1.33 (H) 12/08/2020   BILITOT 0.5 12/08/2020   ALKPHOS 71 11/02/2020   AST 18 12/08/2020   ALT 19 12/08/2020   PROT 6.8 12/08/2020   ALBUMIN 5.1 (H) 11/02/2020   CALCIUM 9.5 12/08/2020   GFRAA 65 12/08/2020    Speciality Comments: No specialty comments available.  Procedures:  No procedures performed Allergies: Patient has no known allergies.   Assessment / Plan:     Visit Diagnoses: Idiopathic chronic gout of multiple sites without tophus -he presents today with acute gout flare starting a week ago.  He started taking colchicine 1 tablet daily which is helpful.  He also had a leftover prednisone which he took for 2 days.   Today he has difficulty walking and some discomfort on the lateral aspect of his left foot.  He had tenderness and swelling on examination.  He reduced the dose of allopurinol 150 mg daily for the last 6 months because he also reduced the dose of Lasix.  He states that he has been also drinking  alcohol.  I did detailed discussion with the patient.  Will obtain labs today.  I advised him to increase allopurinol to 300 mg p.o. daily.  Depending on his uric acid levels we will adjust the dose further.  He may continue colchicine 1 tablet daily until the gout flare resolves.  I also sent a prednisone taper for him starting at 20 mg and taper by 5 mg daily, which should last him 4 days.  Last uric acid on 12/08/2020 5.7 which was in the desirable range.- Plan: Uric acid, Uric acid, predniSONE (DELTASONE) 5 MG tablet  Medication monitoring encounter - Plan: CBC with Differential/Platelet, COMPLETE METABOLIC PANEL WITH GFR, today and then in 3 months CBC with Differential/Platelet, COMPLETE METABOLIC PANEL WITH GFR  Pain in left foot-he had pain and tenderness over the lateral aspect of his left foot.  Subacromial bursitis of right shoulder joint-he works out on a regular basis.  He has been having some discomfort over right subacromial region.  I gave him a handout on shoulder joint exercises.  If his pain persist we can consider cortisone injection.  Primary osteoarthritis of both hands-he has bilateral PIP and DIP thickening.  He denies any discomfort.  Primary osteoarthritis of both feet-he has osteoarthritic changes in his feet.  DDD (degenerative disc disease), lumbar-he denies any discomfort today.  CKD (chronic kidney disease) stage 2, GFR 60-89 ml/min  Other medical problems are listed as follows:  Atherosclerosis of coronary artery bypass graft of native heart without angina pectoris  Chronic diastolic CHF (congestive heart failure) (HCC)-his blood pressure was elevated today.  He has been  advised  to monitor blood pressure closely.  History of atrial fibrillation  Other insomnia  History of hyperlipidemia  Onychomycosis of toenails-I advised her an appointment with the dermatologist.  Orders: Orders Placed This Encounter  Procedures   CBC with Differential/Platelet   COMPLETE METABOLIC PANEL WITH GFR   Uric acid   CBC with Differential/Platelet   COMPLETE METABOLIC PANEL WITH GFR   Uric acid    Meds ordered this encounter  Medications   predniSONE (DELTASONE) 5 MG tablet    Sig: Take 4 tabs po qd x 1 day, 3  tabs po qd x 1 day, 2  tabs po qd x 1 day, 1  tab po qd x 1 day.    Dispense:  10 tablet    Refill:  0      Follow-Up Instructions: Return in about 3 months (around 09/16/2021) for Gout.   Bo Merino, MD  Note - This record has been created using Editor, commissioning.  Chart creation errors have been sought, but may not always  have been located. Such creation errors do not reflect on  the standard of medical care.

## 2021-06-16 ENCOUNTER — Other Ambulatory Visit: Payer: Self-pay

## 2021-06-16 ENCOUNTER — Other Ambulatory Visit: Payer: Self-pay | Admitting: Family Medicine

## 2021-06-16 ENCOUNTER — Encounter: Payer: Self-pay | Admitting: Rheumatology

## 2021-06-16 ENCOUNTER — Ambulatory Visit: Payer: BC Managed Care – PPO | Admitting: Rheumatology

## 2021-06-16 VITALS — BP 142/86 | HR 69 | Ht 67.0 in | Wt 188.0 lb

## 2021-06-16 DIAGNOSIS — E291 Testicular hypofunction: Secondary | ICD-10-CM

## 2021-06-16 DIAGNOSIS — Z5181 Encounter for therapeutic drug level monitoring: Secondary | ICD-10-CM | POA: Diagnosis not present

## 2021-06-16 DIAGNOSIS — M7551 Bursitis of right shoulder: Secondary | ICD-10-CM

## 2021-06-16 DIAGNOSIS — M19042 Primary osteoarthritis, left hand: Secondary | ICD-10-CM

## 2021-06-16 DIAGNOSIS — M1A09X0 Idiopathic chronic gout, multiple sites, without tophus (tophi): Secondary | ICD-10-CM

## 2021-06-16 DIAGNOSIS — N182 Chronic kidney disease, stage 2 (mild): Secondary | ICD-10-CM

## 2021-06-16 DIAGNOSIS — I25810 Atherosclerosis of coronary artery bypass graft(s) without angina pectoris: Secondary | ICD-10-CM

## 2021-06-16 DIAGNOSIS — R7989 Other specified abnormal findings of blood chemistry: Secondary | ICD-10-CM

## 2021-06-16 DIAGNOSIS — M51369 Other intervertebral disc degeneration, lumbar region without mention of lumbar back pain or lower extremity pain: Secondary | ICD-10-CM

## 2021-06-16 DIAGNOSIS — Z8639 Personal history of other endocrine, nutritional and metabolic disease: Secondary | ICD-10-CM

## 2021-06-16 DIAGNOSIS — B351 Tinea unguium: Secondary | ICD-10-CM

## 2021-06-16 DIAGNOSIS — I5032 Chronic diastolic (congestive) heart failure: Secondary | ICD-10-CM

## 2021-06-16 DIAGNOSIS — M79672 Pain in left foot: Secondary | ICD-10-CM

## 2021-06-16 DIAGNOSIS — G4709 Other insomnia: Secondary | ICD-10-CM

## 2021-06-16 DIAGNOSIS — M19071 Primary osteoarthritis, right ankle and foot: Secondary | ICD-10-CM

## 2021-06-16 DIAGNOSIS — M19072 Primary osteoarthritis, left ankle and foot: Secondary | ICD-10-CM

## 2021-06-16 DIAGNOSIS — M19041 Primary osteoarthritis, right hand: Secondary | ICD-10-CM

## 2021-06-16 DIAGNOSIS — Z8679 Personal history of other diseases of the circulatory system: Secondary | ICD-10-CM

## 2021-06-16 DIAGNOSIS — M5136 Other intervertebral disc degeneration, lumbar region: Secondary | ICD-10-CM

## 2021-06-16 MED ORDER — PREDNISONE 5 MG PO TABS: ORAL_TABLET | ORAL | 0 refills | Status: DC

## 2021-06-16 NOTE — Patient Instructions (Addendum)
Please come in for the lab work 3 days prior to your next visit  Please take allopurinol 300 mg 1 tablet daily    Shoulder Exercises Ask your health care provider which exercises are safe for you. Do exercises exactly as told by your health care provider and adjust them as directed. It is normal to feel mild stretching, pulling, tightness, or discomfort as you do these exercises. Stop right away if you feel sudden pain or your pain gets worse. Do not begin these exercises until told by your health care provider. Stretching exercises External rotation and abduction This exercise is sometimes called corner stretch. This exercise rotates your arm outward (external rotation) and moves your arm out from your body (abduction). Stand in a doorway with one of your feet slightly in front of the other. This is called a staggered stance. If you cannot reach your forearms to the door frame, stand facing a corner of a room. Choose one of the following positions as told by your health care provider: Place your hands and forearms on the door frame above your head. Place your hands and forearms on the door frame at the height of your head. Place your hands on the door frame at the height of your elbows. Slowly move your weight onto your front foot until you feel a stretch across your chest and in the front of your shoulders. Keep your head and chest upright and keep your abdominal muscles tight. Hold for __________ seconds. To release the stretch, shift your weight to your back foot. Repeat __________ times. Complete this exercise __________ times a day. Extension, standing Stand and hold a broomstick, a cane, or a similar object behind your back. Your hands should be a little wider than shoulder width apart. Your palms should face away from your back. Keeping your elbows straight and your shoulder muscles relaxed, move the stick away from your body until you feel a stretch in your shoulders  (extension). Avoid shrugging your shoulders while you move the stick. Keep your shoulder blades tucked down toward the middle of your back. Hold for __________ seconds. Slowly return to the starting position. Repeat __________ times. Complete this exercise __________ times a day. Range-of-motion exercises Pendulum  Stand near a wall or a surface that you can hold onto for balance. Bend at the waist and let your left / right arm hang straight down. Use your other arm to support you. Keep your back straight and do not lock your knees. Relax your left / right arm and shoulder muscles, and move your hips and your trunk so your left / right arm swings freely. Your arm should swing because of the motion of your body, not because you are using your arm or shoulder muscles. Keep moving your hips and trunk so your arm swings in the following directions, as told by your health care provider: Side to side. Forward and backward. In clockwise and counterclockwise circles. Continue each motion for __________ seconds, or for as long as told by your health care provider. Slowly return to the starting position. Repeat __________ times. Complete this exercise __________ times a day. Shoulder flexion, standing  Stand and hold a broomstick, a cane, or a similar object. Place your hands a little more than shoulder width apart on the object. Your left / right hand should be palm up, and your other hand should be palm down. Keep your elbow straight and your shoulder muscles relaxed. Push the stick up with your healthy arm to  raise your left / right arm in front of your body, and then over your head until you feel a stretch in your shoulder (flexion). Avoid shrugging your shoulder while you raise your arm. Keep your shoulder blade tucked down toward the middle of your back. Hold for __________ seconds. Slowly return to the starting position. Repeat __________ times. Complete this exercise __________ times a  day. Shoulder abduction, standing Stand and hold a broomstick, a cane, or a similar object. Place your hands a little more than shoulder width apart on the object. Your left / right hand should be palm up, and your other hand should be palm down. Keep your elbow straight and your shoulder muscles relaxed. Push the object across your body toward your left / right side. Raise your left / right arm to the side of your body (abduction) until you feel a stretch in your shoulder. Do not raise your arm above shoulder height unless your health care provider tells you to do that. If directed, raise your arm over your head. Avoid shrugging your shoulder while you raise your arm. Keep your shoulder blade tucked down toward the middle of your back. Hold for __________ seconds. Slowly return to the starting position. Repeat __________ times. Complete this exercise __________ times a day. Internal rotation  Place your left / right hand behind your back, palm up. Use your other hand to dangle an exercise band, a towel, or a similar object over your shoulder. Grasp the band with your left / right hand so you are holding on to both ends. Gently pull up on the band until you feel a stretch in the front of your left / right shoulder. The movement of your arm toward the center of your body is called internal rotation. Avoid shrugging your shoulder while you raise your arm. Keep your shoulder blade tucked down toward the middle of your back. Hold for __________ seconds. Release the stretch by letting go of the band and lowering your hands. Repeat __________ times. Complete this exercise __________ times a day. Strengthening exercises External rotation  Sit in a stable chair without armrests. Secure an exercise band to a stable object at elbow height on your left / right side. Place a soft object, such as a folded towel or a small pillow, between your left / right upper arm and your body to move your elbow about 4  inches (10 cm) away from your side. Hold the end of the exercise band so it is tight and there is no slack. Keeping your elbow pressed against the soft object, slowly move your forearm out, away from your abdomen (external rotation). Keep your body steady so only your forearm moves. Hold for __________ seconds. Slowly return to the starting position. Repeat __________ times. Complete this exercise __________ times a day. Shoulder abduction  Sit in a stable chair without armrests, or stand up. Hold a __________ weight in your left / right hand, or hold an exercise band with both hands. Start with your arms straight down and your left / right palm facing in, toward your body. Slowly lift your left / right hand out to your side (abduction). Do not lift your hand above shoulder height unless your health care provider tells you that this is safe. Keep your arms straight. Avoid shrugging your shoulder while you do this movement. Keep your shoulder blade tucked down toward the middle of your back. Hold for __________ seconds. Slowly lower your arm, and return to the starting position.  Repeat __________ times. Complete this exercise __________ times a day. Shoulder extension Sit in a stable chair without armrests, or stand up. Secure an exercise band to a stable object in front of you so it is at shoulder height. Hold one end of the exercise band in each hand. Your palms should face each other. Straighten your elbows and lift your hands up to shoulder height. Step back, away from the secured end of the exercise band, until the band is tight and there is no slack. Squeeze your shoulder blades together as you pull your hands down to the sides of your thighs (extension). Stop when your hands are straight down by your sides. Do not let your hands go behind your body. Hold for __________ seconds. Slowly return to the starting position. Repeat __________ times. Complete this exercise __________ times a  day. Shoulder row Sit in a stable chair without armrests, or stand up. Secure an exercise band to a stable object in front of you so it is at waist height. Hold one end of the exercise band in each hand. Position your palms so that your thumbs are facing the ceiling (neutral position). Bend each of your elbows to a 90-degree angle (right angle) and keep your upper arms at your sides. Step back until the band is tight and there is no slack. Slowly pull your elbows back behind you. Hold for __________ seconds. Slowly return to the starting position. Repeat __________ times. Complete this exercise __________ times a day. Shoulder press-ups  Sit in a stable chair that has armrests. Sit upright, with your feet flat on the floor. Put your hands on the armrests so your elbows are bent and your fingers are pointing forward. Your hands should be about even with the sides of your body. Push down on the armrests and use your arms to lift yourself off the chair. Straighten your elbows and lift yourself up as much as you comfortably can. Move your shoulder blades down, and avoid letting your shoulders move up toward your ears. Keep your feet on the ground. As you get stronger, your feet should support less of your body weight as you lift yourself up. Hold for __________ seconds. Slowly lower yourself back into the chair. Repeat __________ times. Complete this exercise __________ times a day. Wall push-ups  Stand so you are facing a stable wall. Your feet should be about one arm-length away from the wall. Lean forward and place your palms on the wall at shoulder height. Keep your feet flat on the floor as you bend your elbows and lean forward toward the wall. Hold for __________ seconds. Straighten your elbows to push yourself back to the starting position. Repeat __________ times. Complete this exercise __________ times a day. This information is not intended to replace advice given to you by your  health care provider. Make sure you discuss any questions you have with your health care provider. Document Revised: 11/21/2018 Document Reviewed: 08/29/2018 [employer] Patient Education  [place].

## 2021-06-16 NOTE — Telephone Encounter (Signed)
Last monitoring labwork over a year ago. Needs OV. This was also recommended when Rx sent in April. If appointment scheduled, then will consider temporary refill until that appointment.

## 2021-06-16 NOTE — Telephone Encounter (Signed)
Requesting:Testosterone 30 mg Contract: UDS: Last Visit:812/22 v/v Next Visit:n/a Last Refill: Rx printed today in error  Please Advise

## 2021-06-17 ENCOUNTER — Other Ambulatory Visit: Payer: Self-pay | Admitting: Interventional Cardiology

## 2021-06-17 LAB — COMPLETE METABOLIC PANEL WITH GFR
AG Ratio: 1.6 (calc) (ref 1.0–2.5)
ALT: 21 U/L (ref 9–46)
AST: 18 U/L (ref 10–35)
Albumin: 4.7 g/dL (ref 3.6–5.1)
Alkaline phosphatase (APISO): 58 U/L (ref 35–144)
BUN: 18 mg/dL (ref 7–25)
CO2: 29 mmol/L (ref 20–32)
Calcium: 9.8 mg/dL (ref 8.6–10.3)
Chloride: 101 mmol/L (ref 98–110)
Creat: 1.27 mg/dL (ref 0.70–1.35)
Globulin: 2.9 g/dL (calc) (ref 1.9–3.7)
Glucose, Bld: 103 mg/dL — ABNORMAL HIGH (ref 65–99)
Potassium: 4.9 mmol/L (ref 3.5–5.3)
Sodium: 139 mmol/L (ref 135–146)
Total Bilirubin: 0.5 mg/dL (ref 0.2–1.2)
Total Protein: 7.6 g/dL (ref 6.1–8.1)
eGFR: 63 mL/min/{1.73_m2} (ref >=60)

## 2021-06-17 LAB — CBC WITH DIFFERENTIAL/PLATELET
Absolute Monocytes: 1853 cells/uL — ABNORMAL HIGH (ref 200–950)
Basophils Absolute: 45 cells/uL (ref 0–200)
Basophils Relative: 0.4 %
Eosinophils Absolute: 45 cells/uL (ref 15–500)
Eosinophils Relative: 0.4 %
HCT: 47.5 % (ref 38.5–50.0)
Hemoglobin: 16.1 g/dL (ref 13.2–17.1)
Lymphs Abs: 1548 cells/uL (ref 850–3900)
MCH: 31.8 pg (ref 27.0–33.0)
MCHC: 33.9 g/dL (ref 32.0–36.0)
MCV: 93.9 fL (ref 80.0–100.0)
MPV: 10.3 fL (ref 7.5–12.5)
Monocytes Relative: 16.4 %
Neutro Abs: 7808 cells/uL — ABNORMAL HIGH (ref 1500–7800)
Neutrophils Relative %: 69.1 %
Platelets: 213 10*3/uL (ref 140–400)
RBC: 5.06 10*6/uL (ref 4.20–5.80)
RDW: 12 % (ref 11.0–15.0)
Total Lymphocyte: 13.7 %
WBC: 11.3 10*3/uL — ABNORMAL HIGH (ref 3.8–10.8)

## 2021-06-17 LAB — URIC ACID: Uric Acid, Serum: 7.4 mg/dL (ref 4.0–8.0)

## 2021-06-17 NOTE — Progress Notes (Signed)
White cell count is elevated due to prednisone use.  CMP is normal.  Uric acid is 7.5, the desirable value is less than 6.0.  He will increase allopurinol to 300 mg p.o. daily as discussed.  We will recheck uric acid in 1 month.  If uric acid is not below 6.0 then we may have to increase allopurinol to 450 mg daily.

## 2021-06-19 ENCOUNTER — Other Ambulatory Visit: Payer: Self-pay | Admitting: Family Medicine

## 2021-06-19 ENCOUNTER — Encounter: Payer: Self-pay | Admitting: Family Medicine

## 2021-06-19 ENCOUNTER — Telehealth: Payer: Self-pay | Admitting: *Deleted

## 2021-06-19 ENCOUNTER — Ambulatory Visit (INDEPENDENT_AMBULATORY_CARE_PROVIDER_SITE_OTHER): Payer: BC Managed Care – PPO | Admitting: Family Medicine

## 2021-06-19 VITALS — BP 118/80 | HR 74 | Temp 97.2°F | Resp 16 | Wt 186.2 lb

## 2021-06-19 DIAGNOSIS — R234 Changes in skin texture: Secondary | ICD-10-CM | POA: Diagnosis not present

## 2021-06-19 DIAGNOSIS — Z5181 Encounter for therapeutic drug level monitoring: Secondary | ICD-10-CM

## 2021-06-19 DIAGNOSIS — M1A09X0 Idiopathic chronic gout, multiple sites, without tophus (tophi): Secondary | ICD-10-CM

## 2021-06-19 MED ORDER — MUPIROCIN CALCIUM 2 % EX CREA: 1.0000 "application " | TOPICAL_CREAM | Freq: Two times a day (BID) | CUTANEOUS | 0 refills | Status: DC

## 2021-06-19 NOTE — Telephone Encounter (Signed)
-----   Message from Bo Merino, MD sent at 06/17/2021  8:24 PM EDT ----- White cell count is elevated due to prednisone use.  CMP is normal.  Uric acid is 7.5, the desirable value is less than 6.0.  He will increase allopurinol to 300 mg p.o. daily as discussed.  We will recheck uric acid in 1 month.  If uric acid is not  below 6.0 then we may have to increase allopurinol to 450 mg daily.

## 2021-06-19 NOTE — Telephone Encounter (Signed)
Please advise if the refill is ok

## 2021-06-19 NOTE — Telephone Encounter (Signed)
See prior note and today's acute visit - plan for follow up? Asked that appointment be scheduled to discuss testosterone, meds and labs and will need follow up of fissure. I do not see that scheduled yet.

## 2021-06-19 NOTE — Progress Notes (Signed)
   Subjective:    Patient ID: Jeremy Hendricks, male    DOB: [birth year], 65 y.o.   MRN: [id number]  HPI Rash- pt reports he has had a rash of his perineum.  Has tried OTC hemorrhoid cream, antifungal cream, antibacterial creams, hydrocortisone w/o relief.  Sxs started ~10 days ago.  Pt reports pain w/ wiping but not with sitting.  + itching.     Review of Systems For ROS see HPI   This visit occurred during the SARS-CoV-2 public health emergency.  Safety protocols were in place, including screening questions prior to the visit, additional usage of staff PPE, and extensive cleaning of exam room while observing appropriate contact time as indicated for disinfecting solutions.      Objective:   Physical Exam Vitals reviewed.  Constitutional:      Appearance: Normal appearance.  HENT:     Head: Normocephalic and atraumatic.  Skin:    Findings: Lesion (1 unroofed, circular lesion on perineum nearly flush w/ skin, sensitive to touch) present.  Neurological:     General: No focal deficit present.     Mental Status: He is alert and oriented to person, place, and time.  Psychiatric:        Mood and Affect: Mood normal.        Behavior: Behavior normal.        Thought Content: Thought content normal.          Assessment & Plan:   Fissure/lesion- new.  Pt w/ 1 area on perineum that resembles a fissure or unroofed vesicle or resolving pimple.  Given location and TTP will get HSV labs.  Start topical mupirocin and await HSV results.  Pt expressed understanding and is in agreement w/ plan.

## 2021-06-19 NOTE — Patient Instructions (Signed)
Follow up w/ Dr Carlota Raspberry to recheck fissure and review/update meds We'll notify you of your lab results and make any changes if needed Apply the Mupirocin ointment twice daily for 5-7 days Call with any questions or concerns Hang in there!

## 2021-06-20 ENCOUNTER — Other Ambulatory Visit: Payer: Self-pay | Admitting: Family Medicine

## 2021-06-20 DIAGNOSIS — R7989 Other specified abnormal findings of blood chemistry: Secondary | ICD-10-CM

## 2021-06-20 DIAGNOSIS — E291 Testicular hypofunction: Secondary | ICD-10-CM

## 2021-06-20 LAB — HSV 2 ANTIBODY, IGG: HSV 2 Glycoprotein G Ab, IgG: >23 index — ABNORMAL HIGH

## 2021-06-20 MED ORDER — VALACYCLOVIR HCL 500 MG PO TABS: 500.0000 mg | ORAL_TABLET | Freq: Every day | ORAL | 1 refills | Status: DC

## 2021-06-20 NOTE — Telephone Encounter (Signed)
Called patient and scheduled appointment for Thursday 06/22/2021

## 2021-06-21 NOTE — Telephone Encounter (Signed)
This is Rx is a duplicate. Please disregard

## 2021-06-22 ENCOUNTER — Encounter: Payer: Self-pay | Admitting: Family Medicine

## 2021-06-22 ENCOUNTER — Ambulatory Visit: Payer: BC Managed Care – PPO | Admitting: Family Medicine

## 2021-06-22 VITALS — BP 130/72 | HR 63 | Temp 98.1°F | Resp 16 | Ht 67.0 in | Wt 184.2 lb

## 2021-06-22 DIAGNOSIS — Z113 Encounter for screening for infections with a predominantly sexual mode of transmission: Secondary | ICD-10-CM | POA: Diagnosis not present

## 2021-06-22 DIAGNOSIS — E291 Testicular hypofunction: Secondary | ICD-10-CM | POA: Diagnosis not present

## 2021-06-22 DIAGNOSIS — I2510 Atherosclerotic heart disease of native coronary artery without angina pectoris: Secondary | ICD-10-CM

## 2021-06-22 DIAGNOSIS — Z5181 Encounter for therapeutic drug level monitoring: Secondary | ICD-10-CM

## 2021-06-22 DIAGNOSIS — Z79899 Other long term (current) drug therapy: Secondary | ICD-10-CM

## 2021-06-22 DIAGNOSIS — R7989 Other specified abnormal findings of blood chemistry: Secondary | ICD-10-CM

## 2021-06-22 DIAGNOSIS — N5089 Other specified disorders of the male genital organs: Secondary | ICD-10-CM | POA: Diagnosis not present

## 2021-06-22 MED ORDER — EMTRICITABINE-TENOFOVIR DF 200-300 MG PO TABS: ORAL_TABLET | ORAL | 2 refills | Status: DC

## 2021-06-22 MED ORDER — TESTOSTERONE 30 MG/ACT TD SOLN: TRANSDERMAL | 5 refills | Status: DC

## 2021-06-22 NOTE — Progress Notes (Signed)
Subjective:  Patient ID: Jeremy Hendricks, male    DOB: Jan 29, 1956  Age: 65 y.o. MRN: [id number]  CC:  Chief Complaint  Patient presents with   Results    Pt would like to discuss lab work, also looking to have testosterone checked, would like lesion examined that Dr Birdie Riddle had assessed 11/7    HPI Jeremy Hendricks presents for   Follow-up and recheck of lesion.  Has not seen me since October 2021. Doing well.   Followed by rheumatology for gout.  Recent uric acid 7.5 with goal of less than 6, allopurinol increased to 300 mg daily with plan for recheck in 1 month.  Option to increase to 450 mg at that time.  History of CAD, chronic diastolic CHF, CKD stage II, hyperlipidemia.  followed by cardiology.  Dr. Angelena Form.  Last appointment with cardiology in March.  Continued on Crestor, Lasix 40 mg daily with additional 20 mg if needed.  Prior atrial fibrillation treated with amiodarone but that was discontinued and maintained sinus rhythm. Needing less lasix - 1/2 QD now has been doing well. Limiting salt.  Hypogonadism Treated with testosterone 30 mg per actuation solution daily.low of 181 in March 2017.  Last used 3 days ago.  On meds - no new side effects. Occasional irritability.   Lab Results  Component Value Date   TESTOSTERONE 533 05/23/2020   Lab Results  Component Value Date   PSA1 1.5 05/23/2020   PSA1 1.4 03/12/2019   PSA1 1.8 04/15/2018   PSA 0.9 03/29/2016    Lab Results  Component Value Date   WBC 11.3 (H) 06/16/2021   HGB 16.1 06/16/2021   HCT 47.5 06/16/2021   MCV 93.9 06/16/2021   PLT 213 06/16/2021   Lab Results  Component Value Date   ALT 21 06/16/2021   AST 18 06/16/2021   ALKPHOS 71 11/02/2020   BILITOT 0.5 06/16/2021    Lab Results  Component Value Date   CHOL 89 (L) 11/02/2020   HDL 23 (L) 11/02/2020   LDLCALC 36 11/02/2020   TRIG 185 (H) 11/02/2020   CHOLHDL 3.9 11/02/2020   Preexposure prophylaxis for HIV Treated with Truvada 200/300 mg  QD. Recent HSV testing noted.  Last STI testing in October 2021.  negative/nonreactive. 2-3 unprotected partners since last visit.  History of CKD.  Lab Results  Component Value Date   CREATININE 1.27 06/16/2021    Perineal fissure  evaluated 3 days ago.  Started on mupirocin ointment to twice daily for 5 to 7 days.  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  Feels like getting better.  No hx of genital rash or blisters prior to current issue.  Immunization History  Administered Date(s) Administered   Hepatitis B 09/20/2000   Influenza Split 05/14/2015, 06/11/2016   Influenza,inj,Quad PF,6+ Mos 04/04/2017, 04/15/2018, 05/05/2019, 04/20/2020   Influenza-Unspecified 04/04/2017   PFIZER Comirnaty(Gray Top)Covid-19 Tri-Sucrose Vaccine 12/29/2020   PFIZER(Purple Top)SARS-COV-2 Vaccination 10/24/2019, 11/17/2019, 06/09/2020   Pfizer Covid-19 Vaccine Bivalent Booster 72yrs & up 05/16/2021   Tdap 02/11/2017   Zoster, Live 08/13/2013       History Patient Active Problem List   Diagnosis Date Noted   Skin infection 10/09/2018   Chronic diastolic CHF (congestive heart failure) ([place]) 09/23/2018   CKD (chronic kidney disease) stage 2, GFR 60-89 ml/min 09/23/2018   History of ETT    Gout    DJD (degenerative joint disease)    Coronary artery disease  Atrial fibrillation ([place])    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 s/p CABG 2002. b. s/p redo 2012.    DJD (degenerative joint disease)    Gout    History of ETT    a. ETT 6/16:  normal   Past Surgical History:  Procedure Laterality Date   CORONARY ARTERY BYPASS GRAFT  2002   CORONARY ARTERY BYPASS GRAFT  08-2010   L-LAD remained from original CABG; new grafts incl L radial- PDA + RIMA-RI   VASECTOMY     No Known Allergies Prior to Admission medications   Medication Sig Start Date End Date Taking? Authorizing Provider  allopurinol (ZYLOPRIM) 300 MG tablet TAKE 1&1/2 TABLETS BY MOUTH DAILY Patient taking differently: Take 300 mg by mouth daily. 03/14/21  Yes Ofilia Neas, PA-C  atenolol (TENORMIN) 25 MG tablet TAKE 1 TABLET BY MOUTH EVERY DAY 06/19/21  Yes Belva Crome, MD  Colchicine 0.6 MG CAPS TAKE 1 TABLETS ON FIRST DAY OF FLARE UP. THEN TAKE 1 DAILY UNTIL FLARE UP RESOLVED 11/30/20  Yes Maximiano Coss, NP  emtricitabine-tenofovir (TRUVADA) 200-300 MG tablet TAKE ONE TABLET BY MOUTH ONCE DAILY WITH OR WITHOUT FOOD. STORE IN ORIGINAL CONTAINER AT ROOM TEMPERATURE. 03/21/21  Yes Wendie Agreste, MD  furosemide (LASIX) 40 MG tablet TAKE 1 TABLET BY MOUTH DAILY. MAY TAKE AN ADDITIONAL 1/2 TABLET AS NEEDED FOR SWELLING. 11/11/20  Yes Burnell Blanks, MD  Multiple Vitamin (MULTIVITAMIN) tablet Take 1 tablet by mouth daily.   Yes [provider]  mupirocin ointment (BACTROBAN) 2 % Apply topically 2 (two) times daily. 06/19/21  Yes Midge Minium, MD  nitroGLYCERIN (NITROSTAT) 0.4 MG SL tablet Place 0.4 mg under the tongue every 5 (five) minutes as needed for chest pain. Reported on 09/21/2015   Yes [provider]  predniSONE (DELTASONE) 5 MG tablet Take 4 tabs po qd x 1 day, 3  tabs po qd x 1 day, 2  tabs po qd x 1 day, 1  tab po qd x 1 day. Patient taking differently: Take 4 tabs po qd x 1 day, 3  tabs po qd x 1 day, 2  tabs po qd x 1 day, 1  tab po qd x 1 day. 06/16/21  Yes Deveshwar, Abel Presto, MD  rosuvastatin (CRESTOR) 40 MG tablet TAKE 1 TABLET BY MOUTH EVERY DAY  12/06/20  Yes Burnell Blanks, MD  Testosterone 30 MG/ACT SOLN APPLY 1 PUMP EVERY DAY 06/16/21  Yes Wendie Agreste, MD  valACYclovir (VALTREX) 500 MG tablet Take 1 tablet (500 mg total) by mouth daily. 06/20/21  Yes Midge Minium, MD   Social History   Socioeconomic History   Marital status: Single    Spouse name: Not on file   Number of children: 2   Years of education: Not on file   Highest education level: Not on file  Occupational History   Occupation: Realtor  Tobacco Use   Smoking status: Former  Types: Cigarettes    Quit date: 08/14/1999    Years since quitting: 21.8   Smokeless tobacco: Never  Vaping Use   Vaping Use: Never used  Substance and Sexual Activity   Alcohol use: Yes    Comment: 1oz-2oz daily    Drug use: No   Sexual activity: Yes  Other Topics Concern   Not on file  Social History Narrative   Single   Education: College   Exercise: Yes   Social Determinants of Health   Financial Resource Strain: Not on file  Food Insecurity: Not on file  Transportation Needs: Not on file  Physical Activity: Not on file  Stress: Not on file  Social Connections: Not on file  Intimate Partner Violence: Not on file    Review of Systems  Per HPI. Denies other genital lesions or penile discharge  Objective:   Vitals:   06/22/21 1318  BP: 130/72  Pulse: 63  Resp: 16  Temp: 98.1 F (36.7 C)  TempSrc: Temporal  SpO2: 97%  Weight: 184 lb 3.2 oz (83.6 kg)  Height: 5\' 7"  (1.702 m)    Physical Exam Vitals reviewed.  Constitutional:      Appearance: He is well-developed.  HENT:     Head: Normocephalic and atraumatic.  Neck:     Vascular: No carotid bruit or JVD.  Cardiovascular:     Rate and Rhythm: Normal rate and regular rhythm.     Heart sounds: Normal heart sounds. No murmur heard. Pulmonary:     Effort: Pulmonary effort is normal.     Breath sounds: Normal breath sounds. No rales.  Genitourinary:   Musculoskeletal:     Right  lower leg: No edema.     Left lower leg: No edema.  Skin:    General: Skin is warm and dry.  Neurological:     Mental Status: He is alert and oriented to person, place, and time.  Psychiatric:        Mood and Affect: Mood normal.    52 minutes spent during visit, including chart review, counseling and assimilation of information, exam, discussion of plan, and chart completion.    Assessment & Plan:  Jeremy Hendricks is a 65 y.o. male . Hypogonadism in male - Plan: Testosterone, PSA, Lipid panel, Testosterone 30 MG/ACT SOLN  -Check labs.  Continue testosterone same dose for now.  If levels are elevated, likely will need to decrease as off meds for 3 days.  If low levels, consider lab only visit back on meds consistently.  Other recent labs noted.  -Episodic irritability noted as above, RTC precautions if that recurs as that could be related to testosterone versus mood disorder or other cause.   Medication monitoring encounter - Plan: Testosterone, PSA, Lipid panel  - for testosterone treatment.  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  Due to the amount of time of initial symptoms to presentation unlikely benefit from Valtrex for current treatment.  Discussed difference between treatment dosing and prophylaxis dosing as well as options regarding prophylaxis, and potential shedding of virus even without acute symptoms.  He decided against prophylaxis at this time, has Valtrex prescription from last visit if needed for recurrence and dosing discussed.  Safer sex practices discussed.  STI screening as below.  Preexposure prophylaxis.  Routine screening for STI (sexually transmitted  infection) - Plan: Urine cytology ancillary only, HIV Antibody (routine testing w rflx), RPR, emtricitabine-tenofovir  (TRUVADA) 200-300 MG tablet  -Tolerating Truvada, continue same, recent renal function noted.  STI screening as above with safer sex practices discussed.  Coronary artery disease involving native heart without angina pectoris, unspecified vessel or lesion type - Plan: Lipid panel  -As above, follow-up with cardiology.  Tolerating lower dose of furosemide.   Meds ordered this encounter  Medications   emtricitabine-tenofovir (TRUVADA) 200-300 MG tablet    Sig: TAKE ONE TABLET BY MOUTH ONCE DAILY WITH OR WITHOUT FOOD. STORE IN ORIGINAL CONTAINER AT ROOM TEMPERATURE.    Dispense:  90 tablet    Refill:  2   Testosterone 30 MG/ACT SOLN    Sig: APPLY 1 PUMP EVERY DAY    Dispense:  90 mL    Refill:  5    This request is for a new prescription for a controlled substance as required by Federal/State law.NEED REFILL.    Patient Instructions  If you notice more irritability, schedule a visit. No change in meds for now.  If you have a return of genital rash I would recommend taking valtrex twice per day for 3 days or 2 pills per day for 5 days.  If you to decide to taking the Valtrex daily for prevention, let me know and I can send in some further refills.  I will check the other STI screening tests as we discussed today.  Follow-up in 6 months but let me know if there are questions sooner.  I do understand if you need to be seen by primary provider closer to work/home.  Let me know how we can help further.  Take care.   Genital Herpes Genital herpes is a common sexually transmitted infection (STI) that is caused by a virus. The virus spreads from person to person through sexual contact. The infection can cause itching, blisters, and sores around the genitals or rectum. Symptoms may last for several days and then go away. This is called an outbreak. The virus remains in the body, however, so more outbreaks may happen in the future. The time between outbreaks varies and can be from months to  years. Genital herpes can affect anyone. It is particularly concerning for pregnant women because the virus can be passed to the baby during delivery and can cause serious problems. Genital herpes is also a concern for people who have a weak disease-fighting system (immune system). What are the causes? This condition is caused by the human herpesvirus, also called herpes simplex virus, type 1 or type 2 (HSV-1 or HSV-2). The virus may spread through: Sexual contact with an infected person, including vaginal, anal, and oral sex. Contact with fluid from a herpes sore. The skin. This means that you can get herpes from an infected partner even if there are no blisters or sores present. Your partner may not know that he or she is infected. What increases the risk? You are more likely to develop this condition if: You have sex with many partners. You do not use latex condoms during sex. What are the signs or symptoms? Most people do not have symptoms (are asymptomatic), or they have mild symptoms that may be mistaken for other skin problems. Symptoms may include: Small, red bumps near the genitals, rectum, or mouth. These bumps turn into blisters and then sores. Flu-like symptoms, including: Fever. Body aches. Swollen lymph nodes. Headache. Painful urination. Pain and itching in the genital area or rectal  area. Vaginal discharge. Tingling or shooting pain in the legs and buttocks. Generally, symptoms are more severe and last longer during the first (primary) outbreak. Flu-like symptoms are also more common during the primary outbreak. How is this diagnosed? This condition may be diagnosed based on: A physical exam. Your medical history. Blood tests. A test of a fluid sample (culture) from an open sore. How is this treated? There is no cure for this condition, but treatment with antiviral medicines that are taken by mouth (orally) can do the following: Speed up healing and relieve  symptoms. Help to reduce the spread of the virus to sexual partners. Limit the chance of future outbreaks, or make future outbreaks shorter. Lessen symptoms of future outbreaks. Your health care provider may also recommend pain relief medicines, such as aspirin or ibuprofen. Follow these instructions at home: If you have an outbreak: Keep the affected areas dry and clean. Avoid rubbing or touching blisters and sores. If you do touch blisters or sores: Wash your hands thoroughly with soap and water. Do not touch your eyes afterward. To help relieve pain or itching, you may take the following actions as told by your health care provider: Apply a cold, wet cloth (cold compress) to affected areas 4-6 times a day. Apply a substance that protects your skin and reduces bleeding (astringent). Apply a gel that helps relieve pain around sores (lidocaine gel). Take a warm, shallow bath that cleans the genital area (sitz bath). Sexual activity Do not have sexual contact during active outbreaks. Practice safe sex. Herpes can spread even if your partner does not have blisters or sores. Latex condoms and male condoms may help prevent the spread of the herpes virus. General instructions Take over-the-counter and prescription medicines only as told by your health care provider. Keep all follow-up visits as told by your health care provider. This is important. How is this prevented? Use condoms. Although you can get genital herpes during sexual contact even with the use of a condom, a condom can provide some protection. Avoid having multiple sexual partners. Talk with your sexual partner about any symptoms either of you may have. Also, talk with your partner about any history of STIs. Get tested for STIs before you have sex. Ask your partner to do the same. Do not have sexual contact if you have active symptoms of genital herpes. Contact a health care provider if: Your symptoms are not improving with  medicine. Your symptoms return, or you have new symptoms. You have a fever. You have abdominal pain. You have redness, swelling, or pain in your eye. You notice new sores on other parts of your body. You are a woman and you experience bleeding between menstrual periods. You have had herpes and you become pregnant or plan to become pregnant. Summary Genital herpes is a common sexually transmitted infection (STI) that is caused by the herpes simplex virus, type 1 or type 2 (HSV-1 or HSV-2). These viruses are most often spread through sexual contact with an infected person. You are more likely to develop this condition if you have sex with many partners or you do not use condoms during sex. Most people do not have symptoms (are asymptomatic) or have mild symptoms that may be mistaken for other skin problems. Symptoms occur as outbreaks that may happen months or years apart. There is no cure for this condition, but treatment with oral antiviral medicines can reduce symptoms, reduce the chance of spreading the virus to a partner, prevent future outbreaks,  or shorten future outbreaks. This information is not intended to replace advice given to you by your health care provider. Make sure you discuss any questions you have with your health care provider. Document Revised: 04/10/2021 Document Reviewed: 04/09/2019 Elsevier Patient Education  2022 [place],   [name], MD [place], [facility] 06/22/21 6:13 PM

## 2021-06-22 NOTE — Patient Instructions (Addendum)
If you notice more irritability, schedule a visit. No change in meds for now.  If you have a return of genital rash I would recommend taking valtrex twice per day for 3 days or 2 pills per day for 5 days.  If you to decide to taking the Valtrex daily for prevention, let me know and I can send in some further refills.  I will check the other STI screening tests as we discussed today.  Follow-up in 6 months but let me know if there are questions sooner.  I do understand if you need to be seen by primary provider closer to work/home.  Let me know how we can help further.  Take care.   Genital Herpes Genital herpes is a common sexually transmitted infection (STI) that is caused by a virus. The virus spreads from person to person through sexual contact. The infection can cause itching, blisters, and sores around the genitals or rectum. Symptoms may last for several days and then go away. This is called an outbreak. The virus remains in the body, however, so more outbreaks may happen in the future. The time between outbreaks varies and can be from months to years. Genital herpes can affect anyone. It is particularly concerning for pregnant women because the virus can be passed to the baby during delivery and can cause serious problems. Genital herpes is also a concern for people who have a weak disease-fighting system (immune system). What are the causes? This condition is caused by the human herpesvirus, also called herpes simplex virus, type 1 or type 2 (HSV-1 or HSV-2). The virus may spread through: Sexual contact with an infected person, including vaginal, anal, and oral sex. Contact with fluid from a herpes sore. The skin. This means that you can get herpes from an infected partner even if there are no blisters or sores present. Your partner may not know that he or she is infected. What increases the risk? You are more likely to develop this condition if: You have sex with many partners. You do not use  latex condoms during sex. What are the signs or symptoms? Most people do not have symptoms (are asymptomatic), or they have mild symptoms that may be mistaken for other skin problems. Symptoms may include: Small, red bumps near the genitals, rectum, or mouth. These bumps turn into blisters and then sores. Flu-like symptoms, including: Fever. Body aches. Swollen lymph nodes. Headache. Painful urination. Pain and itching in the genital area or rectal area. Vaginal discharge. Tingling or shooting pain in the legs and buttocks. Generally, symptoms are more severe and last longer during the first (primary) outbreak. Flu-like symptoms are also more common during the primary outbreak. How is this diagnosed? This condition may be diagnosed based on: A physical exam. Your medical history. Blood tests. A test of a fluid sample (culture) from an open sore. How is this treated? There is no cure for this condition, but treatment with antiviral medicines that are taken by mouth (orally) can do the following: Speed up healing and relieve symptoms. Help to reduce the spread of the virus to sexual partners. Limit the chance of future outbreaks, or make future outbreaks shorter. Lessen symptoms of future outbreaks. Your health care provider may also recommend pain relief medicines, such as aspirin or ibuprofen. Follow these instructions at home: If you have an outbreak: Keep the affected areas dry and clean. Avoid rubbing or touching blisters and sores. If you do touch blisters or sores: Wash your hands thoroughly  with soap and water. Do not touch your eyes afterward. To help relieve pain or itching, you may take the following actions as told by your health care provider: Apply a cold, wet cloth (cold compress) to affected areas 4-6 times a day. Apply a substance that protects your skin and reduces bleeding (astringent). Apply a gel that helps relieve pain around sores (lidocaine gel). Take a  warm, shallow bath that cleans the genital area (sitz bath). Sexual activity Do not have sexual contact during active outbreaks. Practice safe sex. Herpes can spread even if your partner does not have blisters or sores. Latex condoms and male condoms may help prevent the spread of the herpes virus. General instructions Take over-the-counter and prescription medicines only as told by your health care provider. Keep all follow-up visits as told by your health care provider. This is important. How is this prevented? Use condoms. Although you can get genital herpes during sexual contact even with the use of a condom, a condom can provide some protection. Avoid having multiple sexual partners. Talk with your sexual partner about any symptoms either of you may have. Also, talk with your partner about any history of STIs. Get tested for STIs before you have sex. Ask your partner to do the same. Do not have sexual contact if you have active symptoms of genital herpes. Contact a health care provider if: Your symptoms are not improving with medicine. Your symptoms return, or you have new symptoms. You have a fever. You have abdominal pain. You have redness, swelling, or pain in your eye. You notice new sores on other parts of your body. You are a woman and you experience bleeding between menstrual periods. You have had herpes and you become pregnant or plan to become pregnant. Summary Genital herpes is a common sexually transmitted infection (STI) that is caused by the herpes simplex virus, type 1 or type 2 (HSV-1 or HSV-2). These viruses are most often spread through sexual contact with an infected person. You are more likely to develop this condition if you have sex with many partners or you do not use condoms during sex. Most people do not have symptoms (are asymptomatic) or have mild symptoms that may be mistaken for other skin problems. Symptoms occur as outbreaks that may happen months or  years apart. There is no cure for this condition, but treatment with oral antiviral medicines can reduce symptoms, reduce the chance of spreading the virus to a partner, prevent future outbreaks, or shorten future outbreaks. This information is not intended to replace advice given to you by your health care provider. Make sure you discuss any questions you have with your health care provider. Document Revised: 04/10/2021 Document Reviewed: 04/09/2019 Elsevier Patient Education  [place].

## 2021-06-23 LAB — PSA: PSA: 1.53 ng/mL (ref 0.10–4.00)

## 2021-06-23 LAB — LIPID PANEL
Cholesterol: 94 mg/dL (ref 0–200)
HDL: 27.1 mg/dL — ABNORMAL LOW (ref >39.00)
LDL Cholesterol: 39 mg/dL (ref 0–99)
NonHDL: 66.55
Total CHOL/HDL Ratio: 3
Triglycerides: 140 mg/dL (ref 0.0–149.0)
VLDL: 28 mg/dL (ref 0.0–40.0)

## 2021-06-23 LAB — RPR: RPR Ser Ql: NONREACTIVE

## 2021-06-23 LAB — TESTOSTERONE: Testosterone: 92.7 ng/dL — ABNORMAL LOW (ref 300.00–890.00)

## 2021-06-23 LAB — HIV ANTIBODY (ROUTINE TESTING W REFLEX): HIV 1&2 Ab, 4th Generation: NONREACTIVE

## 2021-07-01 ENCOUNTER — Encounter: Payer: Self-pay | Admitting: Family Medicine

## 2021-07-01 DIAGNOSIS — Z113 Encounter for screening for infections with a predominantly sexual mode of transmission: Secondary | ICD-10-CM

## 2021-07-11 ENCOUNTER — Telehealth: Payer: BC Managed Care – PPO | Admitting: Physician Assistant

## 2021-07-11 DIAGNOSIS — B351 Tinea unguium: Secondary | ICD-10-CM

## 2021-07-11 NOTE — Progress Notes (Signed)
Based on what you shared with me, I feel your condition warrants further evaluation and I recommend that you be seen in a face to face visit.  We do not treat nail fungal infection via e-visit because they do require daily oral medications for up to a few months. This requires lab monitoring of liver function before and during treatment. For this you will need to contact your primary care for evaluation and to start ongoing management.    NOTE: There will be NO CHARGE for this eVisit   If you are having a true medical emergency please call 911.      For an urgent face to face visit, [facility] has six urgent care centers for your convenience:     [facility] [facility] [place] Get Driving Directions [phone] [place], [place] [address]    [facility]) Get Driving Directions [phone] [place], [place] [address]  [facility] [place] [facility] Get Driving Directions [phone] [address] [place],  [place]  [address]  [facility] Get Driving Directions [phone] [place], [place], [place], [place] [address]   [facility] Get Driving Directions  [phone] [address], [place] [address]   [facility] [place] Get Driving Directions [phone] [address], [place], [place] [address]  Your MyChart E-visit questionnaire answers were reviewed by a board certified advanced clinical practitioner to complete your personal care plan based on your specific symptoms.  Thank you for using e-Visits.

## 2021-07-12 ENCOUNTER — Other Ambulatory Visit: Payer: Self-pay | Admitting: Family Medicine

## 2021-07-20 ENCOUNTER — Other Ambulatory Visit (HOSPITAL_COMMUNITY)
Admission: RE | Admit: 2021-07-20 | Discharge: 2021-07-20 | Disposition: A | Discharge status: Home / Self Care | Payer: BC Managed Care – PPO | Source: Ambulatory Visit | Attending: Family Medicine | Admitting: Family Medicine

## 2021-07-20 ENCOUNTER — Ambulatory Visit: Payer: BC Managed Care – PPO | Admitting: Registered Nurse

## 2021-07-20 VITALS — BP 128/70 | HR 62 | Temp 98.0°F | Wt 184.4 lb

## 2021-07-20 DIAGNOSIS — B351 Tinea unguium: Secondary | ICD-10-CM

## 2021-07-20 DIAGNOSIS — Z113 Encounter for screening for infections with a predominantly sexual mode of transmission: Secondary | ICD-10-CM | POA: Insufficient documentation

## 2021-07-20 LAB — HEPATIC FUNCTION PANEL
ALT: 15 U/L (ref 0–53)
AST: 19 U/L (ref 0–37)
Albumin: 4.5 g/dL (ref 3.5–5.2)
Alkaline Phosphatase: 54 U/L (ref 39–117)
Bilirubin, Direct: 0.1 mg/dL (ref 0.0–0.3)
Total Bilirubin: 0.5 mg/dL (ref 0.2–1.2)
Total Protein: 7.3 g/dL (ref 6.0–8.3)

## 2021-07-20 NOTE — Patient Instructions (Signed)
Mr. Grape -   Doristine Devoid to see you, as always.  I'll let you know how liver function is looking. If ok, then we can start terbinafine 250mg  once daily for 12 weeks. Generally a very well tolerated med. Should knock out fungus well. Can repeat course if needed.  Urine testing will be back in 3-4 days - I'll let you know if concerns arise.  Give me a shout with any concerns,  Rich

## 2021-07-20 NOTE — Progress Notes (Signed)
Established Patient Office Visit  Subjective:  Patient ID: Jeremy Hendricks, male    DOB: Mar 29, 1956  Age: 65 y.o. MRN: [id number]  CC:  Chief Complaint  Patient presents with   Nail Problem    Reoccuring/never goes away, has used OTC topicals but no help. Both feet all toes. Follow up lab work    HPI Jeremy Hendricks presents for nail problem, lab work  Nail Longstanding fungus on toenails  Has tried a number of topicals, no relief Keeps nails in good shape with great hygiene.  Has not tried oral antifungals  Lab Routine screening for STI. Did have recent HIV and RPR through blood work No symptoms or known exposure.    Past Medical History:  Diagnosis Date   Atrial fibrillation ([place])    postoperative   Chronic diastolic CHF (congestive heart failure) ([place])    CKD (chronic kidney disease), stage II    Coronary artery disease    a. s/p CABG 2002. b. s/p redo 2012.   DJD (degenerative joint disease)    Gout    History of ETT    a. ETT 6/16:  normal    Past Surgical History:  Procedure Laterality Date   CORONARY ARTERY BYPASS GRAFT  2002   CORONARY ARTERY BYPASS GRAFT  08-2010   L-LAD remained from original CABG; new grafts incl L radial- PDA + RIMA-RI   VASECTOMY      Family History  Problem Relation Age of Onset   Heart attack Father 47   Hypertension Father    Cancer Mother        Breast cancer   Cancer Other        family hx of   Coronary artery disease Other        family hx of   Hyperlipidemia Other        family hx of   Hypertension Paternal Grandfather    Healthy Son    Healthy Daughter    Stroke Neg Hx    Colon cancer Neg Hx     Social History   Socioeconomic History   Marital status: Single    Spouse name: Not on file   Number of children: 2   Years of education: Not on file   Highest education level: Not on file  Occupational History   Occupation: Realtor  Tobacco Use   Smoking status: Former    Types: Cigarettes    Quit date:  08/14/1999    Years since quitting: 21.9   Smokeless tobacco: Never  Vaping Use   Vaping Use: Never used  Substance and Sexual Activity   Alcohol use: Yes    Comment: 1oz-2oz daily    Drug use: No   Sexual activity: Yes  Other Topics Concern   Not on file  Social History Narrative   Single   Education: Secretary/administrator   Exercise: Yes   Social Determinants of Health   Financial Resource Strain: Not on file  Food Insecurity: Not on file  Transportation Needs: Not on file  Physical Activity: Not on file  Stress: Not on file  Social Connections: Not on file  Intimate Partner Violence: Not on file    Outpatient Medications Prior to Visit  Medication Sig Dispense Refill   allopurinol (ZYLOPRIM) 300 MG tablet TAKE 1&1/2 TABLETS BY MOUTH DAILY (Patient taking differently: Take 300 mg by mouth daily.) 135 tablet 0   atenolol (TENORMIN) 25 MG tablet TAKE 1 TABLET BY MOUTH EVERY DAY 90 tablet 0  Colchicine 0.6 MG CAPS TAKE 1 TABLETS ON FIRST DAY OF FLARE UP. THEN TAKE 1 DAILY UNTIL FLARE UP RESOLVED 8 capsule 2   emtricitabine-tenofovir (TRUVADA) 200-300 MG tablet TAKE ONE TABLET BY MOUTH ONCE DAILY WITH OR WITHOUT FOOD. STORE IN ORIGINAL CONTAINER AT ROOM TEMPERATURE. 90 tablet 2   furosemide (LASIX) 40 MG tablet TAKE 1 TABLET BY MOUTH DAILY. MAY TAKE AN ADDITIONAL 1/2 TABLET AS NEEDED FOR SWELLING. 135 tablet 3   Multiple Vitamin (MULTIVITAMIN) tablet Take 1 tablet by mouth daily.     mupirocin ointment (BACTROBAN) 2 % Apply topically 2 (two) times daily. 30 g 0   nitroGLYCERIN (NITROSTAT) 0.4 MG SL tablet Place 0.4 mg under the tongue every 5 (five) minutes as needed for chest pain. Reported on 09/21/2015     predniSONE (DELTASONE) 5 MG tablet Take 4 tabs po qd x 1 day, 3  tabs po qd x 1 day, 2  tabs po qd x 1 day, 1  tab po qd x 1 day. (Patient taking differently: Take 4 tabs po qd x 1 day, 3  tabs po qd x 1 day, 2  tabs po qd x 1 day, 1  tab po qd x 1 day.) 10 tablet 0   rosuvastatin (CRESTOR)  40 MG tablet TAKE 1 TABLET BY MOUTH EVERY DAY 90 tablet 3   Testosterone 30 MG/ACT SOLN APPLY 1 PUMP EVERY DAY 90 mL 5   valACYclovir (VALTREX) 500 MG tablet TAKE 1 TABLET (500 MG TOTAL) BY MOUTH DAILY. 30 tablet 1   No facility-administered medications prior to visit.    No Known Allergies  ROS Review of Systems  Constitutional: Negative.   HENT: Negative.    Eyes: Negative.   Respiratory: Negative.    Cardiovascular: Negative.   Gastrointestinal: Negative.   Genitourinary: Negative.   Musculoskeletal: Negative.   Skin: Negative.   Neurological: Negative.   Psychiatric/Behavioral: Negative.    All other systems reviewed and are negative.    Objective:    Physical Exam Constitutional:      General: He is not in acute distress.    Appearance: Normal appearance. He is normal weight. He is not ill-appearing, toxic-appearing or diaphoretic.  Cardiovascular:     Rate and Rhythm: Normal rate and regular rhythm.     Heart sounds: Normal heart sounds. No murmur heard.   No friction rub. No gallop.  Pulmonary:     Effort: Pulmonary effort is normal. No respiratory distress.     Breath sounds: Normal breath sounds. No stridor. No wheezing, rhonchi or rales.  Chest:     Chest wall: No tenderness.  Skin:    Comments: Onychomycosis of nails of both feet. No skin involvement  Neurological:     General: No focal deficit present.     Mental Status: He is alert and oriented to person, place, and time. Mental status is at baseline.  Psychiatric:        Mood and Affect: Mood normal.        Behavior: Behavior normal.        Thought Content: Thought content normal.        Judgment: Judgment normal.    BP 128/70 (BP Location: Left Arm, Patient Position: Sitting, Cuff Size: Normal)   Pulse 62   Temp 98 F (36.7 C) (Oral)   Wt 184 lb 6.4 oz (83.6 kg)   SpO2 98%   BMI 28.88 kg/m  Wt Readings from Last 3 Encounters:  07/20/21 184 lb 6.4  oz (83.6 kg)  06/22/21 184 lb 3.2 oz (83.6  kg)  06/19/21 186 lb 3.2 oz (84.5 kg)     Health Maintenance Due  Topic Date Due   Zoster Vaccines- Shingrix (1 of 2) Never done   INFLUENZA VACCINE  03/13/2021    There are no preventive care reminders to display for this patient.  Lab Results  Component Value Date   TSH 3.640 11/02/2020   Lab Results  Component Value Date   WBC 11.3 (H) 06/16/2021   HGB 16.1 06/16/2021   HCT 47.5 06/16/2021   MCV 93.9 06/16/2021   PLT 213 06/16/2021   Lab Results  Component Value Date   NA 139 06/16/2021   K 4.9 06/16/2021   CO2 29 06/16/2021   GLUCOSE 103 (H) 06/16/2021   BUN 18 06/16/2021   CREATININE 1.27 06/16/2021   BILITOT 0.5 06/16/2021   ALKPHOS 71 11/02/2020   AST 18 06/16/2021   ALT 21 06/16/2021   PROT 7.6 06/16/2021   ALBUMIN 5.1 (H) 11/02/2020   CALCIUM 9.8 06/16/2021   EGFR 63 06/16/2021   GFR 61.73 10/03/2010   Lab Results  Component Value Date   CHOL 94 06/22/2021   Lab Results  Component Value Date   HDL 27.10 (L) 06/22/2021   Lab Results  Component Value Date   LDLCALC 39 06/22/2021   Lab Results  Component Value Date   TRIG 140.0 06/22/2021   Lab Results  Component Value Date   CHOLHDL 3 06/22/2021   Lab Results  Component Value Date   HGBA1C 5.8 (A) 08/21/2019      Assessment & Plan:   Problem List Items Addressed This Visit   None Visit Diagnoses     Onychomycosis    -  Primary   Relevant Orders   Hepatic function panel   Routine screening for STI (sexually transmitted infection)       Relevant Orders   Urine cytology ancillary only([facility])       No orders of the defined types were placed in this encounter.   Follow-up: No follow-ups on file.   PLAN Check LFTs. If wnl, start terbinafine 265m po qd x 12 weeks. Reviewed risks, benefits, and AE of this medication with patient who voices understanding. Collect urine for STI screen. Patient encouraged to call clinic with any questions, comments, or  concerns.  RMaximiano Coss NP

## 2021-07-21 ENCOUNTER — Other Ambulatory Visit: Payer: Self-pay | Admitting: Registered Nurse

## 2021-07-21 DIAGNOSIS — B351 Tinea unguium: Secondary | ICD-10-CM

## 2021-07-21 LAB — URINE CYTOLOGY ANCILLARY ONLY
Chlamydia: NEGATIVE
Comment: NEGATIVE
Comment: NEGATIVE
Comment: NORMAL
Neisseria Gonorrhea: NEGATIVE
Trichomonas: NEGATIVE

## 2021-07-21 MED ORDER — TERBINAFINE HCL 250 MG PO TABS: 250.0000 mg | ORAL_TABLET | Freq: Every day | ORAL | 0 refills | Status: DC

## 2021-08-15 ENCOUNTER — Other Ambulatory Visit: Payer: Self-pay | Admitting: Family Medicine

## 2021-08-21 ENCOUNTER — Telehealth: Payer: BC Managed Care – PPO | Admitting: Physician Assistant

## 2021-08-21 DIAGNOSIS — J208 Acute bronchitis due to other specified organisms: Secondary | ICD-10-CM

## 2021-08-21 DIAGNOSIS — B9689 Other specified bacterial agents as the cause of diseases classified elsewhere: Secondary | ICD-10-CM

## 2021-08-21 MED ORDER — ALBUTEROL SULFATE HFA 108 (90 BASE) MCG/ACT IN AERS: 2.0000 | INHALATION_SPRAY | Freq: Four times a day (QID) | RESPIRATORY_TRACT | 0 refills | Status: DC | PRN

## 2021-08-21 MED ORDER — BENZONATATE 100 MG PO CAPS: 100.0000 mg | ORAL_CAPSULE | Freq: Three times a day (TID) | ORAL | 0 refills | Status: DC | PRN

## 2021-08-21 MED ORDER — DOXYCYCLINE HYCLATE 100 MG PO TABS: 100.0000 mg | ORAL_TABLET | Freq: Two times a day (BID) | ORAL | 0 refills | Status: DC

## 2021-08-21 NOTE — Progress Notes (Signed)
I have spent 5 minutes in review of e-visit questionnaire, review and updating patient chart, medical decision making and response to patient.   Netha Dafoe Cody Brier Reid, PA-C    

## 2021-08-21 NOTE — Progress Notes (Signed)
We are sorry that you are not feeling well.  Here is how we plan to help!  Based on your presentation I believe you most likely have A cough due to bacteria.  When patients have a fever and a productive cough with a change in color or increased sputum production, we are concerned about bacterial bronchitis.  If left untreated it can progress to pneumonia.  If your symptoms do not improve with your treatment plan it is important that you contact your provider.   I have prescribed Azithromyin 250 mg: two tablets now and then one tablet daily for 4 additonal days    In addition you may use A prescription cough medication called Tessalon Perles 100mg . You may take 1-2 capsules every 8 hours as needed for your cough. I have sent in a prescription for albuterol to use as directed, if needed, for wheezing.   From your responses in the eVisit questionnaire you describe inflammation in the upper respiratory tract which is causing a significant cough.  This is commonly called Bronchitis and has four common causes:   Allergies Viral Infections Acid Reflux Bacterial Infection Allergies, viruses and acid reflux are treated by controlling symptoms or eliminating the cause. An example might be a cough caused by taking certain blood pressure medications. You stop the cough by changing the medication. Another example might be a cough caused by acid reflux. Controlling the reflux helps control the cough.  USE OF BRONCHODILATOR ("RESCUE") INHALERS: There is a risk from using your bronchodilator too frequently.  The risk is that over-reliance on a medication which only relaxes the muscles surrounding the breathing tubes can reduce the effectiveness of medications prescribed to reduce swelling and congestion of the tubes themselves.  Although you feel brief relief from the bronchodilator inhaler, your asthma may actually be worsening with the tubes becoming more swollen and filled with mucus.  This can delay other crucial  treatments, such as oral steroid medications. If you need to use a bronchodilator inhaler daily, several times per day, you should discuss this with your provider.  There are probably better treatments that could be used to keep your asthma under control.     HOME CARE Only take medications as instructed by your medical team. Complete the entire course of an antibiotic. Drink plenty of fluids and get plenty of rest. Avoid close contacts especially the very young and the elderly Cover your mouth if you cough or cough into your sleeve. Always remember to wash your hands A steam or ultrasonic humidifier can help congestion.   GET HELP RIGHT AWAY IF: You develop worsening fever. You become short of breath You cough up blood. Your symptoms persist after you have completed your treatment plan MAKE SURE YOU  Understand these instructions. Will watch your condition. Will get help right away if you are not doing well or get worse.    Thank you for choosing an e-visit.  Your e-visit answers were reviewed by a board certified advanced clinical practitioner to complete your personal care plan. Depending upon the condition, your plan could have included both over the counter or prescription medications.  Please review your pharmacy choice. Make sure the pharmacy is open so you can pick up prescription now. If there is a problem, you may contact your provider through [employer] and have the prescription routed to another pharmacy.  Your safety is important to [place]. If you have drug allergies check your prescription carefully.   For the next 24 hours you  can use MyChart to ask questions about today's visit, request a non-urgent call back, or ask for a work or school excuse. You will get an email in the next two days asking about your experience. I hope that your e-visit has been valuable and will speed your recovery.

## 2021-08-23 ENCOUNTER — Telehealth: Payer: BC Managed Care – PPO | Admitting: Registered Nurse

## 2021-08-29 ENCOUNTER — Encounter: Payer: Self-pay | Admitting: Registered Nurse

## 2021-08-29 ENCOUNTER — Ambulatory Visit (INDEPENDENT_AMBULATORY_CARE_PROVIDER_SITE_OTHER): Payer: BC Managed Care – PPO | Admitting: Registered Nurse

## 2021-08-29 VITALS — BP 128/78 | HR 78 | Temp 98.0°F | Resp 17 | Ht 67.0 in | Wt 186.2 lb

## 2021-08-29 DIAGNOSIS — J22 Unspecified acute lower respiratory infection: Secondary | ICD-10-CM | POA: Diagnosis not present

## 2021-08-29 MED ORDER — HYDROCODONE BIT-HOMATROP MBR 5-1.5 MG/5ML PO SOLN: 5.0000 mL | Freq: Every evening | ORAL | 0 refills | Status: DC | PRN

## 2021-08-29 MED ORDER — LEVOFLOXACIN 750 MG PO TABS: 750.0000 mg | ORAL_TABLET | Freq: Every day | ORAL | 0 refills | Status: DC

## 2021-08-29 NOTE — Patient Instructions (Signed)
Mr. Boorman -   Doristine Devoid to see you  Take it easy for the next week when taking the levaquin.  Ok to continue benzonatate  Ok to add hycodan before bed as needed - may make you sleepy.  Call if no improvement by Friday.  Can add prednisone at that time if need be.  Thank you!  Rich

## 2021-08-29 NOTE — Progress Notes (Signed)
+  Established Patient Office Visit  Subjective:  Patient ID: Jeremy Hendricks, male    DOB: [birth year]  Age: 66 y.o. MRN: [id number]  CC:  Chief Complaint  Patient presents with   Cough    About the same as virtual appt, worse at night, has had some fatigue, reports 2 weeks now     HPI Jeremy Hendricks presents for cough  Ongoing x 2 weeks Seen via E Visit with Elyn Aquas, PA on 08/21/21. Given doxycycilne, tessalon, albuterol No relief. Cough persists. Some fatigue, but no headaches, fevers, chills, sweats, lightheadedness, dizziness, LOC. Some diarrhea - suspects related to doxycycline.   Feels like he has 80-85% of lung capacity.  Worse when lying down. Cough is productive by morning with yellow/brown phlegm.  Has been using albuterol intermittently, no relief. States it feels like "a different type of cough"  No sick contacts.   Past Medical History:  Diagnosis Date   Atrial fibrillation ([place])    postoperative   Chronic diastolic CHF (congestive heart failure) ([place])    CKD (chronic kidney disease), stage II    Coronary artery disease    a. s/p CABG 2002. b. s/p redo 2012.   DJD (degenerative joint disease)    Gout    History of ETT    a. ETT 6/16:  normal    Past Surgical History:  Procedure Laterality Date   CORONARY ARTERY BYPASS GRAFT  2002   CORONARY ARTERY BYPASS GRAFT  08-2010   L-LAD remained from original CABG; new grafts incl L radial- PDA + RIMA-RI   VASECTOMY      Family History  Problem Relation Age of Onset   Heart attack Father 62   Hypertension Father    Cancer Mother        Breast cancer   Cancer Other        family hx of   Coronary artery disease Other        family hx of   Hyperlipidemia Other        family hx of   Hypertension Paternal Grandfather    Healthy Son    Healthy Daughter    Stroke Neg Hx    Colon cancer Neg Hx     Social History   Socioeconomic History   Marital status: Single    Spouse name: Not on file   Number  of children: 2   Years of education: Not on file   Highest education level: Not on file  Occupational History   Occupation: Realtor  Tobacco Use   Smoking status: Former    Types: Cigarettes    Quit date: 08/14/1999    Years since quitting: 22.0   Smokeless tobacco: Never  Vaping Use   Vaping Use: Never used  Substance and Sexual Activity   Alcohol use: Yes    Comment: 1oz-2oz daily    Drug use: No   Sexual activity: Yes  Other Topics Concern   Not on file  Social History Narrative   Single   Education: Secretary/administrator   Exercise: Yes   Social Determinants of Health   Financial Resource Strain: Not on file  Food Insecurity: Not on file  Transportation Needs: Not on file  Physical Activity: Not on file  Stress: Not on file  Social Connections: Not on file  Intimate Partner Violence: Not on file    Outpatient Medications Prior to Visit  Medication Sig Dispense Refill   albuterol (VENTOLIN HFA) 108 (90 Base) MCG/ACT inhaler  Inhale 2 puffs into the lungs every 6 (six) hours as needed for wheezing or shortness of breath. 8 g 0   allopurinol (ZYLOPRIM) 300 MG tablet TAKE 1&1/2 TABLETS BY MOUTH DAILY (Patient taking differently: Take 300 mg by mouth daily.) 135 tablet 0   atenolol (TENORMIN) 25 MG tablet TAKE 1 TABLET BY MOUTH EVERY DAY 90 tablet 0   benzonatate (TESSALON) 100 MG capsule Take 1 capsule (100 mg total) by mouth 3 (three) times daily as needed for cough. 30 capsule 0   Colchicine 0.6 MG CAPS TAKE 1 TABLETS ON FIRST DAY OF FLARE UP. THEN TAKE 1 DAILY UNTIL FLARE UP RESOLVED 8 capsule 2   emtricitabine-tenofovir (TRUVADA) 200-300 MG tablet TAKE ONE TABLET BY MOUTH ONCE DAILY WITH OR WITHOUT FOOD. STORE IN ORIGINAL CONTAINER AT ROOM TEMPERATURE. 90 tablet 2   furosemide (LASIX) 40 MG tablet TAKE 1 TABLET BY MOUTH DAILY. MAY TAKE AN ADDITIONAL 1/2 TABLET AS NEEDED FOR SWELLING. 135 tablet 3   Multiple Vitamin (MULTIVITAMIN) tablet Take 1 tablet by mouth daily.     mupirocin  ointment (BACTROBAN) 2 % Apply topically 2 (two) times daily. 30 g 0   nitroGLYCERIN (NITROSTAT) 0.4 MG SL tablet Place 0.4 mg under the tongue every 5 (five) minutes as needed for chest pain. Reported on 09/21/2015     predniSONE (DELTASONE) 5 MG tablet Take 4 tabs po qd x 1 day, 3  tabs po qd x 1 day, 2  tabs po qd x 1 day, 1  tab po qd x 1 day. (Patient taking differently: Take 4 tabs po qd x 1 day, 3  tabs po qd x 1 day, 2  tabs po qd x 1 day, 1  tab po qd x 1 day.) 10 tablet 0   rosuvastatin (CRESTOR) 40 MG tablet TAKE 1 TABLET BY MOUTH EVERY DAY 90 tablet 3   terbinafine (LAMISIL) 250 MG tablet Take 1 tablet (250 mg total) by mouth daily. 84 tablet 0   Testosterone 30 MG/ACT SOLN APPLY 1 PUMP EVERY DAY 90 mL 5   valACYclovir (VALTREX) 500 MG tablet TAKE 1 TABLET (500 MG TOTAL) BY MOUTH DAILY. 30 tablet 1   doxycycline (VIBRA-TABS) 100 MG tablet Take 1 tablet (100 mg total) by mouth 2 (two) times daily. (Patient not taking: Reported on 08/29/2021) 14 tablet 0   No facility-administered medications prior to visit.    No Known Allergies  ROS Review of Systems Per hpi     Objective:    Physical Exam Constitutional:      General: He is not in acute distress.    Appearance: Normal appearance. He is normal weight. He is not ill-appearing, toxic-appearing or diaphoretic.  Cardiovascular:     Rate and Rhythm: Normal rate and regular rhythm.     Heart sounds: Normal heart sounds. No murmur heard.   No friction rub. No gallop.  Pulmonary:     Effort: Pulmonary effort is normal. No respiratory distress.     Breath sounds: No stridor. Wheezing present. No rhonchi or rales.     Comments: Diminished breath sounds R lower lobes Chest:     Chest wall: No tenderness.  Neurological:     General: No focal deficit present.     Mental Status: He is alert and oriented to person, place, and time. Mental status is at baseline.  Psychiatric:        Mood and Affect: Mood normal.        Behavior:  Behavior  normal.        Thought Content: Thought content normal.        Judgment: Judgment normal.    BP 128/78    Pulse 78    Temp 98 F (36.7 C) (Temporal)    Resp 17    Ht 5' 7" (1.702 m)    Wt 186 lb 3.2 oz (84.5 kg)    HC 16" (40.6 cm)    SpO2 98%    BMI 29.16 kg/m  Wt Readings from Last 3 Encounters:  08/29/21 186 lb 3.2 oz (84.5 kg)  07/20/21 184 lb 6.4 oz (83.6 kg)  06/22/21 184 lb 3.2 oz (83.6 kg)     Health Maintenance Due  Topic Date Due   Zoster Vaccines- Shingrix (1 of 2) Never done   INFLUENZA VACCINE  03/13/2021    There are no preventive care reminders to display for this patient.  Lab Results  Component Value Date   TSH 3.640 11/02/2020   Lab Results  Component Value Date   WBC 11.3 (H) 06/16/2021   HGB 16.1 06/16/2021   HCT 47.5 06/16/2021   MCV 93.9 06/16/2021   PLT 213 06/16/2021   Lab Results  Component Value Date   NA 139 06/16/2021   K 4.9 06/16/2021   CO2 29 06/16/2021   GLUCOSE 103 (H) 06/16/2021   BUN 18 06/16/2021   CREATININE 1.27 06/16/2021   BILITOT 0.5 07/20/2021   ALKPHOS 54 07/20/2021   AST 19 07/20/2021   ALT 15 07/20/2021   PROT 7.3 07/20/2021   ALBUMIN 4.5 07/20/2021   CALCIUM 9.8 06/16/2021   EGFR 63 06/16/2021   GFR 61.73 10/03/2010   Lab Results  Component Value Date   CHOL 94 06/22/2021   Lab Results  Component Value Date   HDL 27.10 (L) 06/22/2021   Lab Results  Component Value Date   LDLCALC 39 06/22/2021   Lab Results  Component Value Date   TRIG 140.0 06/22/2021   Lab Results  Component Value Date   CHOLHDL 3 06/22/2021   Lab Results  Component Value Date   HGBA1C 5.8 (A) 08/21/2019      Assessment & Plan:   Problem List Items Addressed This Visit   None Visit Diagnoses     Lower respiratory infection    -  Primary   Relevant Medications   levofloxacin (LEVAQUIN) 750 MG tablet   HYDROcodone bit-homatropine (HYCODAN) 5-1.5 MG/5ML syrup       Meds ordered this encounter  Medications    levofloxacin (LEVAQUIN) 750 MG tablet    Sig: Take 1 tablet (750 mg total) by mouth daily.    Dispense:  7 tablet    Refill:  0    Order Specific Question:   Supervising Provider    Answer:   Carlota Raspberry, JEFFREY R [[id number]]   HYDROcodone bit-homatropine (HYCODAN) 5-1.5 MG/5ML syrup    Sig: Take 5 mLs by mouth at bedtime as needed for cough.    Dispense:  120 mL    Refill:  0    Order Specific Question:   Supervising Provider    Answer:   Carlota Raspberry, JEFFREY R [[id number]]    Follow-up: Return if symptoms worsen or fail to improve.   PLAN Levaquin 760m po qd for one week. Reviewed risks, benefits, AE of this. Continue benzonatate. Can add hycodan before bed prn. Return if worsening or failing to improve Patient encouraged to call clinic with any questions, comments, or concerns.  RMaximiano Coss NP

## 2021-08-31 ENCOUNTER — Encounter: Payer: Self-pay | Admitting: Registered Nurse

## 2021-09-01 NOTE — Progress Notes (Deleted)
Office Visit Note  Patient: Jeremy Hendricks             Date of Birth: 1956/05/21           MRN: [id number]             PCP: Wendie Agreste, MD Referring: Wendie Agreste, MD Visit Date: 09/15/2021 Occupation: @GUAROCC @  Subjective:  No chief complaint on file.   History of Present Illness: Jeremy Hendricks is a 66 y.o. male ***   Activities of Daily Living:  Patient reports morning stiffness for *** {minute/hour:19697}.   Patient {ACTIONS;DENIES/REPORTS:21021675::"Denies"} nocturnal pain.  Difficulty dressing/grooming: {ACTIONS;DENIES/REPORTS:21021675::"Denies"} Difficulty climbing stairs: {ACTIONS;DENIES/REPORTS:21021675::"Denies"} Difficulty getting out of chair: {ACTIONS;DENIES/REPORTS:21021675::"Denies"} Difficulty using hands for taps, buttons, cutlery, and/or writing: {ACTIONS;DENIES/REPORTS:21021675::"Denies"}  No Rheumatology ROS completed.   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 s/p CABG 2002. b. s/p redo 2012.   DJD (degenerative joint disease)    Gout    History of ETT    a. ETT 6/16:  normal    Family History   Problem Relation Age of Onset   Heart attack Father 53   Hypertension Father    Cancer Mother        Breast cancer   Cancer Other        family hx of   Coronary artery disease Other        family hx of   Hyperlipidemia Other        family hx of   Hypertension Paternal Grandfather    Healthy Son    Healthy Daughter    Stroke Neg Hx    Colon cancer Neg Hx    Past Surgical History:  Procedure Laterality Date   CORONARY ARTERY BYPASS GRAFT  2002   CORONARY ARTERY BYPASS GRAFT  08-2010   L-LAD remained from original CABG; new grafts incl L radial- PDA + RIMA-RI   VASECTOMY     Social History   Social History Narrative   Single   Education: College   Exercise: Yes   Immunization History  Administered Date(s) Administered   Hepatitis B 09/20/2000   Influenza Split 05/14/2015, 06/11/2016   Influenza,inj,Quad PF,6+ Mos 04/04/2017, 04/15/2018, 05/05/2019, 04/20/2020   Influenza-Unspecified 04/04/2017   PFIZER Comirnaty(Gray Top)Covid-19 Tri-Sucrose Vaccine 12/29/2020   PFIZER(Purple Top)SARS-COV-2 Vaccination 10/24/2019, 11/17/2019, 06/09/2020   Pfizer Covid-19 Vaccine Bivalent Booster 79yrs & up 05/16/2021   Tdap 02/11/2017   Zoster, Live 08/13/2013     Objective: Vital Signs: There were no vitals taken for this visit.   Physical Exam   Musculoskeletal  Exam: ***  CDAI Exam: CDAI Score: -- Patient Global: --; Provider Global: -- Swollen: --; Tender: -- Joint Exam 09/15/2021   No joint exam has been documented for this visit   There is currently no information documented on the homunculus. Go to the Rheumatology activity and complete the homunculus joint exam.  Investigation: No additional findings.  Imaging: No results found.  Recent Labs: Lab Results  Component Value Date   WBC 11.3 (H) 06/16/2021   HGB 16.1 06/16/2021   PLT 213 06/16/2021   NA 139 06/16/2021   K 4.9 06/16/2021   CL 101 06/16/2021   CO2 29 06/16/2021   GLUCOSE 103 (H) 06/16/2021    BUN 18 06/16/2021   CREATININE 1.27 06/16/2021   BILITOT 0.5 07/20/2021   ALKPHOS 54 07/20/2021   AST 19 07/20/2021   ALT 15 07/20/2021   PROT 7.3 07/20/2021   ALBUMIN 4.5 07/20/2021   CALCIUM 9.8 06/16/2021   GFRAA 65 12/08/2020    Speciality Comments: No specialty comments available.  Procedures:  No procedures performed Allergies: Patient has no known allergies.   Assessment / Plan:     Visit Diagnoses: No diagnosis found.  Orders: No orders of the defined types were placed in this encounter.  No orders of the defined types were placed in this encounter.   Face-to-face time spent with patient was *** minutes. Greater than 50% of time was spent in counseling and coordination of care.  Follow-Up Instructions: No follow-ups on file.   Earnestine Mealing, CMA  Note - This record has been created using Editor, commissioning.  Chart creation errors have been sought, but may not always  have been located. Such creation errors do not reflect on  the standard of medical care.

## 2021-09-02 ENCOUNTER — Encounter: Payer: Self-pay | Admitting: Cardiovascular Disease

## 2021-09-04 NOTE — Telephone Encounter (Signed)
Patient is prescribed Crestor 40 mg daily and his LDL in November was 39.  I don't think he qualifies for a PCSK9 I.

## 2021-09-12 ENCOUNTER — Other Ambulatory Visit: Payer: Self-pay | Admitting: Family Medicine

## 2021-09-15 ENCOUNTER — Other Ambulatory Visit: Payer: Self-pay | Admitting: Interventional Cardiology

## 2021-09-15 ENCOUNTER — Ambulatory Visit: Payer: BC Managed Care – PPO | Admitting: Rheumatology

## 2021-09-15 DIAGNOSIS — M79672 Pain in left foot: Secondary | ICD-10-CM

## 2021-09-15 DIAGNOSIS — Z8679 Personal history of other diseases of the circulatory system: Secondary | ICD-10-CM

## 2021-09-15 DIAGNOSIS — M5136 Other intervertebral disc degeneration, lumbar region: Secondary | ICD-10-CM

## 2021-09-15 DIAGNOSIS — M7551 Bursitis of right shoulder: Secondary | ICD-10-CM

## 2021-09-15 DIAGNOSIS — M19041 Primary osteoarthritis, right hand: Secondary | ICD-10-CM

## 2021-09-15 DIAGNOSIS — M1A09X0 Idiopathic chronic gout, multiple sites, without tophus (tophi): Secondary | ICD-10-CM

## 2021-09-15 DIAGNOSIS — N182 Chronic kidney disease, stage 2 (mild): Secondary | ICD-10-CM

## 2021-09-15 DIAGNOSIS — I25810 Atherosclerosis of coronary artery bypass graft(s) without angina pectoris: Secondary | ICD-10-CM

## 2021-09-15 DIAGNOSIS — M19071 Primary osteoarthritis, right ankle and foot: Secondary | ICD-10-CM

## 2021-09-15 DIAGNOSIS — Z5181 Encounter for therapeutic drug level monitoring: Secondary | ICD-10-CM

## 2021-09-15 DIAGNOSIS — I5032 Chronic diastolic (congestive) heart failure: Secondary | ICD-10-CM

## 2021-09-15 DIAGNOSIS — Z8639 Personal history of other endocrine, nutritional and metabolic disease: Secondary | ICD-10-CM

## 2021-09-15 DIAGNOSIS — G4709 Other insomnia: Secondary | ICD-10-CM

## 2021-09-15 DIAGNOSIS — B351 Tinea unguium: Secondary | ICD-10-CM

## 2021-09-22 ENCOUNTER — Other Ambulatory Visit: Payer: Self-pay | Admitting: Physician Assistant

## 2021-09-22 NOTE — Telephone Encounter (Signed)
Next Visit: 12/19/2021  Last Visit: 06/16/2021  Last Fill: 03/14/2021  DX:  Idiopathic chronic gout of multiple sites without tophus  Current Dose per office note 06/16/2021: allopurinol to 300 mg p.o. daily  Labs: 06/16/2021 White cell count is elevated due to prednisone use.  CMP is normal.  Uric acid is 7.5, the desirable value is less than 6.0.   Okay to refill Allopurinol?

## 2021-10-12 ENCOUNTER — Other Ambulatory Visit: Payer: Self-pay | Admitting: Interventional Cardiology

## 2021-10-31 NOTE — Progress Notes (Signed)
? ?Office Visit Note ? ?Patient: Jeremy Hendricks             ?Date of Birth: 12-08-1955           ?MRN: [id number]             ?PCP: Wendie Agreste, MD ?Referring: Wendie Agreste, MD ?Visit Date: 11/01/2021 ?Occupation: '@GUAROCC'$ @ ? ?Subjective:  ?Gout (Neuropathy bil feet ) ? ? ?History of Present Illness: Jeremy Hendricks is a 66 y.o. male history of osteoarthritis gout and degenerative disc disease.  He went to [place] the first week of March.  He states there he did a lot of walking after that he started experiencing some numbness in his both feet and both feet.  He has history of degenerative disease of lumbar spine.  He has been experiencing lower back pain.  He has not had typical gout flare but has been having increased discomfort in his left foot.  He has been taking allopurinol on a regular basis.  He did not have to take any colchicine. ? ?Activities of Daily Living:  ?Patient reports morning stiffness for 30 minutes.   ?Patient Reports nocturnal pain.  ?Difficulty dressing/grooming: Denies ?Difficulty climbing stairs: Denies ?Difficulty getting out of chair: Denies ?Difficulty using hands for taps, buttons, cutlery, and/or writing: Denies ? ?Review of Systems  ?Constitutional:  Positive for fatigue.  ?HENT:  Negative for mouth dryness.   ?Eyes:  Negative for dryness.  ?Respiratory:  Negative for difficulty breathing.   ?Cardiovascular:  Positive for swelling in legs/feet.  ?Gastrointestinal:  Negative for constipation.  ?Endocrine: Negative for excessive thirst.  ?Genitourinary:  Negative for difficulty urinating.  ?Musculoskeletal:  Positive for joint pain, gait problem, joint pain, morning stiffness and muscle tenderness.  ?Skin:  Negative for rash.  ?Allergic/Immunologic: Negative for susceptible to infections.  ?Neurological:  Positive for numbness.  ?Hematological:  Negative for bruising/bleeding tendency.  ?Psychiatric/Behavioral:  Negative for sleep disturbance.   ? ?PMFS History:  ?Patient Active  Problem List  ? Diagnosis Date Noted  ?? Skin infection 10/09/2018  ?? Chronic diastolic CHF (congestive heart failure) ([place]) 09/23/2018  ?? CKD (chronic kidney disease) stage 2, GFR 60-89 ml/min 09/23/2018  ?? History of ETT   ?? Gout   ?? DJD (degenerative joint disease)   ?? Coronary artery disease   ?? Atrial fibrillation ([place])   ?? 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 HLD (hyperlipidemia) 06/11/2016  ?? Hypogonadism in male 09/09/2015  ?? INSOMNIA 09/19/2010  ?? SHORTNESS OF BREATH 09/19/2010  ?? CHEST PAIN-PRECORDIAL 08/23/2010  ?? CAD, ARTERY BYPASS GRAFT 10/26/2009  ?? HYPERLIPIDEMIA 10/24/2009  ?? DEGENERATIVE JOINT DISEASE 10/24/2009  ?? ANGINA, HX OF 10/24/2009  ?  ?Past Medical History:  ?Diagnosis Date  ?? Atrial fibrillation ([place])   ? postoperative  ?? Chronic diastolic CHF (congestive heart failure) ([place])   ?? CKD (chronic kidney disease), stage II   ?? Coronary artery disease   ? a. s/p CABG 2002. b. s/p redo 2012.  ?? DJD (degenerative joint disease)   ?? Gout   ?? History of ETT   ? a. ETT 6/16:  normal  ?  ?Family History  ?Problem Relation Age of Onset  ?? Heart attack Father 82  ?? Hypertension Father   ?? Cancer Mother   ?     Breast cancer  ?? Cancer Other   ?     family hx of  ?? Coronary artery disease Other   ?     family hx  of  ?? Hyperlipidemia Other   ?     family hx of  ?? Hypertension Paternal Grandfather   ?? Healthy Son   ?? Healthy Daughter   ?? Stroke Neg Hx   ?? Colon cancer Neg Hx   ? ?Past Surgical History:  ?Procedure Laterality Date  ?? CORONARY ARTERY BYPASS GRAFT  2002  ?? CORONARY ARTERY BYPASS GRAFT  08-2010  ? L-LAD remained from original CABG; new grafts incl L radial- PDA + RIMA-RI  ?? VASECTOMY    ? ?Social History  ? ?Social History Narrative  ? Single  ? Education: College  ? Exercise: Yes  ? ?Immunization History  ?Administered Date(s) Administered  ?? Hepatitis B 09/20/2000  ?? Influenza Split 05/14/2015, 06/11/2016  ?? Influenza,inj,Quad PF,6+ Mos 04/04/2017, 04/15/2018,  05/05/2019, 04/20/2020  ?? Influenza-Unspecified 04/04/2017  ?? PFIZER Comirnaty(Gray Top)Covid-19 Tri-Sucrose Vaccine 12/29/2020  ?? PFIZER(Purple Top)SARS-COV-2 Vaccination 10/24/2019, 11/17/2019, 06/09/2020  ?? Pension scheme manager 71yr & up 05/16/2021  ?? Tdap 02/11/2017  ?? Zoster, Live 08/13/2013  ?  ? ?Objective: ?Vital Signs: BP 133/77 (BP Location: Left Arm, Patient Position: Sitting, Cuff Size: Normal)   Pulse (!) 58   Resp 15   Ht '5\' 7"'$  (1.702 m)   Wt 183 lb (83 kg)   BMI 28.66 kg/m?   ? ?Physical Exam ?Vitals and nursing note reviewed.  ?Constitutional:   ?   Appearance: He is well-developed.  ?HENT:  ?   Head: Normocephalic and atraumatic.  ?Eyes:  ?   Conjunctiva/sclera: Conjunctivae normal.  ?   Pupils: Pupils are equal, round, and reactive to light.  ?Cardiovascular:  ?   Rate and Rhythm: Normal rate and regular rhythm.  ?   Heart sounds: Normal heart sounds.  ?Pulmonary:  ?   Effort: Pulmonary effort is normal.  ?   Breath sounds: Normal breath sounds.  ?Abdominal:  ?   General: Bowel sounds are normal.  ?   Palpations: Abdomen is soft.  ?Musculoskeletal:  ?   Cervical back: Normal range of motion and neck supple.  ?Skin: ?   General: Skin is warm and dry.  ?   Capillary Refill: Capillary refill takes less than 2 seconds.  ?Neurological:  ?   Mental Status: He is alert and oriented to person, place, and time.  ?Psychiatric:     ?   Behavior: Behavior normal.  ?  ? ?Musculoskeletal Exam: C-spine thoracic and lumbar spine were in good range of motion.  He had discomfort range of motion of the lumbar spine.  Shoulder joints, elbow joints, wrist joints with good range of motion.  He had bilateral DIP thickening.  Hip joints and knee joints in good range of motion.  He had tenderness on palpation over left fifth MTP joint.  None of the joints were swollen or tender on palpation. ? ?CDAI Exam: ?CDAI Score: -- ?Patient Global: --; Provider Global: -- ?Swollen: --; Tender:  -- ?Joint Exam 11/01/2021  ? ?No joint exam has been documented for this visit  ? ?There is currently no information documented on the homunculus. Go to the Rheumatology activity and complete the homunculus joint exam. ? ?Investigation: ?No additional findings. ? ?Imaging: ?XR Foot 2 Views Left ? ?Result Date: 11/01/2021 ?First MTP, PIP and DIP narrowing was noted.  Cystic changes were noted in the first metatarsal.  No intertarsal, tibiotalar or subtalar joint space narrowing was noted.  Posterior calcaneal spur was noted. Impression: These findings are consistent with osteoarthritis of the  foot. ? ?XR Lumbar Spine 2-3 Views ? ?Result Date: 11/01/2021 ?Levoscoliosis was noted.  Multilevel spondylosis with anterior osteophytes was noted.  Significant disc space narrowing was noted between L2-L3 and L3-L4.  Facet joint arthropathy was noted. Impression: These findings are consistent with lumbar scoliosis, multilevel spondylosis and facet joint arthropathy of the lumbar spine.  ? ?Recent Labs: ?Lab Results  ?Component Value Date  ? WBC 11.3 (H) 06/16/2021  ? HGB 16.1 06/16/2021  ? PLT 213 06/16/2021  ? NA 139 06/16/2021  ? K 4.9 06/16/2021  ? CL 101 06/16/2021  ? CO2 29 06/16/2021  ? GLUCOSE 103 (H) 06/16/2021  ? BUN 18 06/16/2021  ? CREATININE 1.27 06/16/2021  ? BILITOT 0.5 07/20/2021  ? ALKPHOS 54 07/20/2021  ? AST 19 07/20/2021  ? ALT 15 07/20/2021  ? PROT 7.3 07/20/2021  ? ALBUMIN 4.5 07/20/2021  ? CALCIUM 9.8 06/16/2021  ? GFRAA 65 12/08/2020  ? ? ?Speciality Comments: No specialty comments available. ? ?Procedures:  ?No procedures performed ?Allergies: Patient has no known allergies.  ? ?Assessment / Plan:     ?Visit Diagnoses: Idiopathic chronic gout of multiple sites without tophus - allopurinol 300 mg p.o. daily. uric acid: 06/16/2021 7.4. -He has not had a gout flare since the last visit.  He went to [place] and did a lot of walking.  He has been having left fifth toe discomfort but no joint swelling was noted.   Dietary modifications were discussed.  Prescription refill for colchicine was given.  He wants to keep a prednisone prescription for flares on as needed basis.  Plan: Colchicine 0.6 MG CAPS, predniSONE (DELTASONE)

## 2021-11-01 ENCOUNTER — Telehealth: Payer: Self-pay

## 2021-11-01 ENCOUNTER — Ambulatory Visit (INDEPENDENT_AMBULATORY_CARE_PROVIDER_SITE_OTHER): Payer: BC Managed Care – PPO

## 2021-11-01 ENCOUNTER — Ambulatory Visit: Payer: BC Managed Care – PPO | Admitting: Rheumatology

## 2021-11-01 ENCOUNTER — Other Ambulatory Visit: Payer: Self-pay

## 2021-11-01 ENCOUNTER — Encounter: Payer: Self-pay | Admitting: Rheumatology

## 2021-11-01 VITALS — BP 133/77 | HR 58 | Resp 15 | Ht 67.0 in | Wt 183.0 lb

## 2021-11-01 DIAGNOSIS — G629 Polyneuropathy, unspecified: Secondary | ICD-10-CM

## 2021-11-01 DIAGNOSIS — Z8679 Personal history of other diseases of the circulatory system: Secondary | ICD-10-CM

## 2021-11-01 DIAGNOSIS — M1A09X0 Idiopathic chronic gout, multiple sites, without tophus (tophi): Secondary | ICD-10-CM

## 2021-11-01 DIAGNOSIS — M5136 Other intervertebral disc degeneration, lumbar region: Secondary | ICD-10-CM | POA: Diagnosis not present

## 2021-11-01 DIAGNOSIS — Z8639 Personal history of other endocrine, nutritional and metabolic disease: Secondary | ICD-10-CM

## 2021-11-01 DIAGNOSIS — M7551 Bursitis of right shoulder: Secondary | ICD-10-CM | POA: Diagnosis not present

## 2021-11-01 DIAGNOSIS — B351 Tinea unguium: Secondary | ICD-10-CM

## 2021-11-01 DIAGNOSIS — M19041 Primary osteoarthritis, right hand: Secondary | ICD-10-CM

## 2021-11-01 DIAGNOSIS — I25810 Atherosclerosis of coronary artery bypass graft(s) without angina pectoris: Secondary | ICD-10-CM

## 2021-11-01 DIAGNOSIS — M19072 Primary osteoarthritis, left ankle and foot: Secondary | ICD-10-CM

## 2021-11-01 DIAGNOSIS — M79672 Pain in left foot: Secondary | ICD-10-CM

## 2021-11-01 DIAGNOSIS — M19042 Primary osteoarthritis, left hand: Secondary | ICD-10-CM

## 2021-11-01 DIAGNOSIS — M51369 Other intervertebral disc degeneration, lumbar region without mention of lumbar back pain or lower extremity pain: Secondary | ICD-10-CM

## 2021-11-01 DIAGNOSIS — G4709 Other insomnia: Secondary | ICD-10-CM

## 2021-11-01 DIAGNOSIS — N182 Chronic kidney disease, stage 2 (mild): Secondary | ICD-10-CM

## 2021-11-01 DIAGNOSIS — Z5181 Encounter for therapeutic drug level monitoring: Secondary | ICD-10-CM

## 2021-11-01 DIAGNOSIS — M19071 Primary osteoarthritis, right ankle and foot: Secondary | ICD-10-CM

## 2021-11-01 DIAGNOSIS — I5032 Chronic diastolic (congestive) heart failure: Secondary | ICD-10-CM

## 2021-11-01 MED ORDER — PREDNISONE 5 MG PO TABS: ORAL_TABLET | ORAL | 0 refills | Status: DC

## 2021-11-01 MED ORDER — COLCHICINE 0.6 MG PO CAPS: ORAL_CAPSULE | ORAL | 2 refills | Status: DC

## 2021-11-01 NOTE — Telephone Encounter (Signed)
Advised patient of x-ray results from today's visit. Patient verbalized understanding.  ?

## 2021-11-02 LAB — COMPLETE METABOLIC PANEL WITH GFR
AG Ratio: 1.9 (calc) (ref 1.0–2.5)
ALT: 14 U/L (ref 9–46)
AST: 18 U/L (ref 10–35)
Albumin: 4.9 g/dL (ref 3.6–5.1)
Alkaline phosphatase (APISO): 59 U/L (ref 35–144)
BUN/Creatinine Ratio: 13 (calc) (ref 6–22)
BUN: 18 mg/dL (ref 7–25)
CO2: 31 mmol/L (ref 20–32)
Calcium: 9.8 mg/dL (ref 8.6–10.3)
Chloride: 101 mmol/L (ref 98–110)
Creat: 1.44 mg/dL — ABNORMAL HIGH (ref 0.70–1.35)
Globulin: 2.6 g/dL (calc) (ref 1.9–3.7)
Glucose, Bld: 85 mg/dL (ref 65–99)
Potassium: 4.9 mmol/L (ref 3.5–5.3)
Sodium: 140 mmol/L (ref 135–146)
Total Bilirubin: 0.5 mg/dL (ref 0.2–1.2)
Total Protein: 7.5 g/dL (ref 6.1–8.1)
eGFR: 54 mL/min/{1.73_m2} — ABNORMAL LOW (ref >=60)

## 2021-11-02 LAB — CBC WITH DIFFERENTIAL/PLATELET
Absolute Monocytes: 1278 cells/uL — ABNORMAL HIGH (ref 200–950)
Basophils Absolute: 54 cells/uL (ref 0–200)
Basophils Relative: 0.7 %
Eosinophils Absolute: 208 cells/uL (ref 15–500)
Eosinophils Relative: 2.7 %
HCT: 50.4 % — ABNORMAL HIGH (ref 38.5–50.0)
Hemoglobin: 16.6 g/dL (ref 13.2–17.1)
Lymphs Abs: 1933 cells/uL (ref 850–3900)
MCH: 31.4 pg (ref 27.0–33.0)
MCHC: 32.9 g/dL (ref 32.0–36.0)
MCV: 95.3 fL (ref 80.0–100.0)
MPV: 10.4 fL (ref 7.5–12.5)
Monocytes Relative: 16.6 %
Neutro Abs: 4227 cells/uL (ref 1500–7800)
Neutrophils Relative %: 54.9 %
Platelets: 208 10*3/uL (ref 140–400)
RBC: 5.29 10*6/uL (ref 4.20–5.80)
RDW: 12.5 % (ref 11.0–15.0)
Total Lymphocyte: 25.1 %
WBC: 7.7 10*3/uL (ref 3.8–10.8)

## 2021-11-02 LAB — URIC ACID: Uric Acid, Serum: 5 mg/dL (ref 4.0–8.0)

## 2021-11-02 LAB — B12 AND FOLATE PANEL
Folate: >24 ng/mL
Vitamin B-12: 1284 pg/mL — ABNORMAL HIGH (ref 200–1100)

## 2021-11-02 LAB — HEMOGLOBIN A1C
Hgb A1c MFr Bld: 5.6 % of total Hgb (ref <5.7)
Mean Plasma Glucose: 114 mg/dL
eAG (mmol/L): 6.3 mmol/L

## 2021-11-02 NOTE — Progress Notes (Signed)
Monocyte count is mildly elevated and stable.  Creatinine is elevated, most likely due to diuretic use.  Vitamin B12 is high.  Folate is high.  Uric acid is 5.0 in desirable range.  Hemoglobin A1c excess 5.6 which is normal.

## 2021-11-08 ENCOUNTER — Other Ambulatory Visit: Payer: Self-pay | Admitting: Interventional Cardiology

## 2021-11-27 NOTE — Progress Notes (Signed)
? ?Cardiology Office Note   ? ?Date:  12/05/2021  ? ?ID:  Jeremy Hendricks, DOB 10-30-1955, MRN [id number] ? ? ?PCP:  Wendie Agreste, MD ?  ?[place]  ?Cardiologist:  Lauree Chandler, MD   ?Advanced Practice Provider:  No care team member to display ?Electrophysiologist:  None  ? ?[id number]}  ? ?Chief Complaint  ?Patient presents with  ? Follow-up  ? ? ?History of Present Illness:  ?Jeremy Hendricks is a 66 y.o. male  with history of CAD status post CABG in 2002 redo 2012, postop A. fib, chronic diastolic CHF.  Echo 2014 normal LVEF, normal GXT 01/2015 CKD last creatinine 1.23-03/2018.  ?  ?I last saw the patient 09/23/18 and he was having some trouble with swelling. 2Decho 10/08/18 normal LVEF 83-15% with diastolic dysfunction. ? ?I last saw the patient 11/02/20 and having some dyspnea and swelling and taking extra lasix. Crt was up and I asked him to decrease lasix and follow low sodium diet(professional chef). Echo 11/30/20 normal LVEF with grade 2 DD. ? ?Patient comes in for f/u. Patient denies chest pain, palpitations. Short of breath only if he bends over. Does elliptical, bike, rowing machine-45 min/3-4 days a week and lifting. Swelling no longer a problem on lasix 20 mg daily. ? ?Past Medical History:  ?Diagnosis Date  ? Atrial fibrillation ([place])   ? postoperative  ? Chronic diastolic CHF (congestive heart failure) ([place])   ? CKD (chronic kidney disease), stage II   ? Coronary artery disease   ? a. s/p CABG 2002. b. s/p redo 2012.  ? DJD (degenerative joint disease)   ? Gout   ? History of ETT   ? a. ETT 6/16:  normal  ? ? ?Past Surgical History:  ?Procedure Laterality Date  ? CORONARY ARTERY BYPASS GRAFT  2002  ? CORONARY ARTERY BYPASS GRAFT  08-2010  ? L-LAD remained from original CABG; new grafts incl L radial- PDA + RIMA-RI  ? VASECTOMY    ? ? ?Current Medications: ?Current Meds  ?Medication Sig  ? albuterol (VENTOLIN HFA) 108 (90 Base) MCG/ACT inhaler Inhale 2 puffs into the lungs  every 6 (six) hours as needed for wheezing or shortness of breath.  ? allopurinol (ZYLOPRIM) 300 MG tablet Take 1 tablet (300 mg total) by mouth daily. (Patient taking differently: Take 300 mg by mouth daily. 1 1/2 daily)  ? atenolol (TENORMIN) 25 MG tablet TAKE 1 TABLET BY MOUTH EVERY DAY  ? Colchicine 0.6 MG CAPS TAKE 1 TABLETS ON FIRST DAY OF FLARE UP. THEN TAKE 1 DAILY UNTIL FLARE UP RESOLVED  ? emtricitabine-tenofovir (TRUVADA) 200-300 MG tablet TAKE ONE TABLET BY MOUTH ONCE DAILY WITH OR WITHOUT FOOD. STORE IN ORIGINAL CONTAINER AT ROOM TEMPERATURE.  ? furosemide (LASIX) 20 MG tablet Take 1 tablet (20 mg total) by mouth every other day.  ? HYDROcodone bit-homatropine (HYCODAN) 5-1.5 MG/5ML syrup Take 5 mLs by mouth at bedtime as needed for cough.  ? Multiple Vitamin (MULTIVITAMIN) tablet Take 1 tablet by mouth daily.  ? mupirocin ointment (BACTROBAN) 2 % Apply topically 2 (two) times daily.  ? nitroGLYCERIN (NITROSTAT) 0.4 MG SL tablet Place 0.4 mg under the tongue every 5 (five) minutes as needed for chest pain. Reported on 09/21/2015  ? predniSONE (DELTASONE) 5 MG tablet Take 5 mg by mouth as needed (breakouts).  ? rosuvastatin (CRESTOR) 40 MG tablet TAKE 1 TABLET BY MOUTH EVERY DAY  ? terbinafine (LAMISIL) 250 MG tablet Take 1 tablet (  250 mg total) by mouth daily. (Patient taking differently: Take 250 mg by mouth as needed.)  ? Testosterone 30 MG/ACT SOLN APPLY 1 PUMP EVERY DAY  ? valACYclovir (VALTREX) 500 MG tablet TAKE 1 TABLET (500 MG TOTAL) BY MOUTH DAILY. (Patient taking differently: Take 500 mg by mouth as needed.)  ? [DISCONTINUED] furosemide (LASIX) 40 MG tablet TAKE 1 TABLET BY MOUTH DAILY. MAY TAKE AN ADDITIONAL 1/2 TABLET AS NEEDED FOR SWELLING.  ? [DISCONTINUED] predniSONE (DELTASONE) 5 MG tablet Take 4 tabs po qd x 1 day, 3  tabs po qd x 1 day, 2  tabs po qd x 1 day, 1  tab po qd x 1 day. (Patient taking differently: as needed.)  ?  ? ?Allergies:   Patient has no known allergies.  ? ?Social  History  ? ?Socioeconomic History  ? Marital status: Single  ?  Spouse name: Not on file  ? Number of children: 2  ? Years of education: Not on file  ? Highest education level: Not on file  ?Occupational History  ? Occupation: Realtor  ?Tobacco Use  ? Smoking status: Former  ?  Types: Cigarettes  ?  Quit date: 08/14/1999  ?  Years since quitting: 22.3  ?  Passive exposure: Past  ? Smokeless tobacco: Never  ? Tobacco comments:  ?  Social smoker  ?Vaping Use  ? Vaping Use: Never used  ?Substance and Sexual Activity  ? Alcohol use: Yes  ?  Comment: 1oz-2oz daily   ? Drug use: No  ? Sexual activity: Yes  ?Other Topics Concern  ? Not on file  ?Social History Narrative  ? Single  ? Education: College  ? Exercise: Yes  ? ?Social Determinants of Health  ? ?Financial Resource Strain: Not on file  ?Food Insecurity: Not on file  ?Transportation Needs: Not on file  ?Physical Activity: Not on file  ?Stress: Not on file  ?Social Connections: Not on file  ?  ? ?Family History:  The patient's  family history includes Cancer in his mother and another family member; Coronary artery disease in an other family member; Healthy in his daughter and son; Heart attack (age of onset: 60) in his father; Hyperlipidemia in an other family member; Hypertension in his father and paternal grandfather.  ? ?ROS:   ?Please see the history of present illness.    ?ROS All other systems reviewed and are negative. ? ? ?PHYSICAL EXAM:   ?VS:  BP 125/69   Pulse (!) 56   Ht '5\' 7"'$  (1.702 m)   Wt 186 lb (84.4 kg)   SpO2 100%   BMI 29.13 kg/m?   ?Physical Exam  ?GEN: Well nourished, well developed, in no acute distress  ?Neck: no JVD, carotid bruits, or masses ?Cardiac:RRR; no murmurs, rubs, or gallops  ?Respiratory:  clear to auscultation bilaterally, normal work of breathing ?GI: soft, nontender, nondistended, + BS ?Ext: without cyanosis, clubbing, or edema, Good distal pulses bilaterally ?Neuro:  Alert and Oriented x 3,  ?Psych: euthymic mood, full  affect ? ?Wt Readings from Last 3 Encounters:  ?12/05/21 186 lb (84.4 kg)  ?11/01/21 183 lb (83 kg)  ?08/29/21 186 lb 3.2 oz (84.5 kg)  ?  ? ? ?Studies/Labs Reviewed:  ? ?EKG:  EKG is  ordered today.  The ekg ordered today demonstrates NSR with ant TWI nonspecific ST changes, no acute change. ? ?Recent Labs: ?11/01/2021: ALT 14; BUN 18; Creat 1.44; Hemoglobin 16.6; Platelets 208; Potassium 4.9; Sodium 140  ? ?  Lipid Panel ?   ?Component Value Date/Time  ? CHOL 94 06/22/2021 1439  ? CHOL 89 (L) 11/02/2020 1114  ? TRIG 140.0 06/22/2021 1439  ? HDL 27.10 (L) 06/22/2021 1439  ? HDL 23 (L) 11/02/2020 1114  ? CHOLHDL 3 06/22/2021 1439  ? VLDL 28.0 06/22/2021 1439  ? LDLCALC 39 06/22/2021 1439  ? LDLCALC 36 11/02/2020 1114  ? ? ?Additional studies/ records that were reviewed today include:  ?  ?Echo 11/30/20 ?IMPRESSIONS  ? ? ? 1. Left ventricular ejection fraction, by estimation, is 55 to 60%. The  ?left ventricle has normal function. The left ventricle has no regional  ?wall motion abnormalities. Left ventricular diastolic parameters are  ?consistent with Grade II diastolic  ?dysfunction (pseudonormalization). Elevated left atrial pressure.  ? 2. Right ventricular systolic function is mildly reduced. The right  ?ventricular size is normal. Tricuspid regurgitation signal is inadequate  ?for assessing PA pressure.  ? 3. The mitral valve is normal in structure. No evidence of mitral valve  ?regurgitation. No evidence of mitral stenosis.  ? 4. The aortic valve is normal in structure. Aortic valve regurgitation is  ?not visualized. No aortic stenosis is present.  ? 5. The inferior vena cava is normal in size with greater than 50%  ?respiratory variability, suggesting right atrial pressure of 3 mmHg.  ? ?Comparison(s): No significant change from prior study. Prior images  ?reviewed side by side.   ?2D echo 10/08/2018 ?  ?IMPRESSIONS  ? ? ? 1. The left ventricle has normal systolic function with an ejection  ?fraction of 60-65%.  The cavity size was normal. Left ventricular diastolic  ?Doppler parameters are consistent with pseudonormalization Elevated left  ?ventricular end-diastolic pressure  ?The E/e' is 20. There is abnormal septal motion

## 2021-11-30 ENCOUNTER — Other Ambulatory Visit: Payer: Self-pay | Admitting: Cardiovascular Disease

## 2021-12-01 ENCOUNTER — Other Ambulatory Visit: Payer: Self-pay | Admitting: Cardiovascular Disease

## 2021-12-05 ENCOUNTER — Ambulatory Visit: Payer: Medicare PPO | Admitting: Physician Assistant

## 2021-12-05 ENCOUNTER — Encounter: Payer: Self-pay | Admitting: Physician Assistant

## 2021-12-05 VITALS — BP 125/69 | HR 56 | Ht 67.0 in | Wt 186.0 lb

## 2021-12-05 DIAGNOSIS — I2510 Atherosclerotic heart disease of native coronary artery without angina pectoris: Secondary | ICD-10-CM | POA: Diagnosis not present

## 2021-12-05 DIAGNOSIS — N182 Chronic kidney disease, stage 2 (mild): Secondary | ICD-10-CM

## 2021-12-05 DIAGNOSIS — E7849 Other hyperlipidemia: Secondary | ICD-10-CM

## 2021-12-05 DIAGNOSIS — I5032 Chronic diastolic (congestive) heart failure: Secondary | ICD-10-CM

## 2021-12-05 MED ORDER — FUROSEMIDE 20 MG PO TABS
20.0000 mg | ORAL_TABLET | ORAL | 3 refills | Status: DC
Start: 2021-12-05 — End: 2022-06-08

## 2021-12-05 NOTE — Patient Instructions (Addendum)
Medication Instructions:  ?Decrease Lasix to 20 mg every other day  ? ?*If you need a refill on your cardiac medications before your next appointment, please call your pharmacy* ? ? ?Lab Work: ?Your physician recommends that you return for lab work on Tuesday, June 6- Lab is open form 7:15 am to 4:30 pm  ?Lab: Basic Metabolic Panel  ? ?If you have labs (blood work) drawn today and your tests are completely normal, you will receive your results only by: ?MyChart Message (if you have MyChart) OR ?A paper copy in the mail ?If you have any lab test that is abnormal or we need to change your treatment, we will call you to review the results. ? ? ?Testing/Procedures: ?None ordered  ? ? ?Follow-Up: ?At [facility], you and your health needs are our priority.  As part of our continuing mission to provide you with exceptional heart care, we have created designated Provider Care Teams.  These Care Teams include your primary Cardiologist (physician) and Advanced Practice Providers (APPs -  Physician Assistants and Nurse Practitioners) who all work together to provide you with the care you need, when you need it. ? ?We recommend signing up for the patient portal called "MyChart".  Sign up information is provided on this After Visit Summary.  MyChart is used to connect with patients for Virtual Visits (Telemedicine).  Patients are able to view lab/test results, encounter notes, upcoming appointments, etc.  Non-urgent messages can be sent to your provider as well.   ?To learn more about what you can do with MyChart, go to [email or web address].   ? ?Your next appointment:   ?12 month(s) ? ?The format for your next appointment:   ?In Person ? ?Provider:   ?Lauree Chandler, MD   ? ? ?Other Instructions ?Two Gram Sodium Diet 2000 mg ? ?What is Sodium? Sodium is a mineral found naturally in many foods. The most significant source of sodium in the diet is table salt, which is about 40% sodium.  Processed, convenience, and  preserved foods also contain a large amount of sodium.  The body needs only 500 mg of sodium daily to function,  A normal diet provides more than enough sodium even if you do not use salt. ? ?Why Limit Sodium? A build up of sodium in the body can cause thirst, increased blood pressure, shortness of breath, and water retention.  Decreasing sodium in the diet can reduce edema and risk of heart attack or stroke associated with high blood pressure.  Keep in mind that there are many other factors involved in these health problems.  Heredity, obesity, lack of exercise, cigarette smoking, stress and what you eat all play a role. ? ?General Guidelines: ?Do not add salt at the table or in cooking.  One teaspoon of salt contains over 2 grams of sodium. ?Read food labels ?Avoid processed and convenience foods ?Ask your dietitian before eating any foods not dicussed in the menu planning guidelines ?Consult your physician if you wish to use a salt substitute or a sodium containing medication such as antacids.  Limit milk and milk products to 16 oz (2 cups) per day. ? ?Shopping Hints: ?READ LABELS!! "Dietetic" does not necessarily mean low sodium. ?Salt and other sodium ingredients are often added to foods during processing. ? ? ? ?Menu Planning Guidelines ?Food Group Choose More Often Avoid  ?Beverages (see also the milk group All fruit juices, low-sodium, salt-free vegetables juices, low-sodium carbonated beverages Regular vegetable or tomato juices, commercially softened water  used for drinking or cooking  ?Breads and Cereals Enriched white, wheat, rye and pumpernickel bread, hard rolls and dinner rolls; muffins, cornbread and waffles; most dry cereals, cooked cereal without added salt; unsalted crackers and breadsticks; low sodium or homemade bread crumbs Bread, rolls and crackers with salted tops; quick breads; instant hot cereals; pancakes; commercial bread stuffing; self-rising flower and biscuit mixes; regular bread  crumbs or cracker crumbs  ?Desserts and Sweets Desserts and sweets mad with mild should be within allowance Instant pudding mixes and cake mixes  ?Fats Butter or margarine; vegetable oils; unsalted salad dressings, regular salad dressings limited to 1 Tbs; light, sour and heavy cream Regular salad dressings containing bacon fat, bacon bits, and salt pork; snack dips made with instant soup mixes or processed cheese; salted nuts  ?Fruits Most fresh, frozen and canned fruits Fruits processed with salt or sodium-containing ingredient (some dried fruits are processed with sodium sulfites  ? ? ? ? ? ? ?Vegetables Fresh, frozen vegetables and low- sodium canned vegetables Regular canned vegetables, sauerkraut, pickled vegetables, and others prepared in brine; frozen vegetables in sauces; vegetables seasoned with ham, bacon or salt pork  ?Condiments, Sauces, Miscellaneous ? Salt substitute with physician's approval; pepper, herbs, spices; vinegar, lemon or lime juice; hot pepper sauce; garlic powder, onion powder, low sodium soy sauce (1 Tbs.); low sodium condiments (ketchup, chili sauce, mustard) in limited amounts (1 tsp.) fresh ground horseradish; unsalted tortilla chips, pretzels, potato chips, popcorn, salsa (1/4 cup) Any seasoning made with salt including garlic salt, celery salt, onion salt, and seasoned salt; sea salt, rock salt, kosher salt; meat tenderizers; monosodium glutamate; mustard, regular soy sauce, barbecue, sauce, chili sauce, teriyaki sauce, steak sauce, Worcestershire sauce, and most flavored vinegars; canned gravy and mixes; regular condiments; salted snack foods, olives, picles, relish, horseradish sauce, catsup  ? ?Food preparation: Try these seasonings ?Meats:    ?Pork Sage, onion Serve with applesauce  ?Chicken Poultry seasoning, thyme, parsley Serve with cranberry sauce  ?Lamb Curry powder, rosemary, garlic, thyme Serve with mint sauce or jelly  ?Veal Marjoram, basil Serve with current jelly,  cranberry sauce  ?Beef Pepper, bay leaf Serve with dry mustard, unsalted chive butter  ?Fish [place] leaf, dill Serve with unsalted lemon butter, unsalted parsley butter  ?Vegetables:    ?Asparagus Lemon juice   ?Broccoli Lemon juice   ?Carrots Mustard dressing parsley, mint, nutmeg, glazed with unsalted butter and sugar   ?Green beans Marjoram, lemon juice, nutmeg,dill seed   ?Tomatoes Basil, marjoram, onion   ?Spice /blend for "Salt Shaker" 4 tsp ground thyme ?1 tsp ground sage 3 tsp ground rosemary ?4 tsp ground marjoram  ? ?Test your knowledge ?A product that says "Salt Free" may still contain sodium. True or False ?Garlic Powder and Hot Pepper Sauce an be used as alternative seasonings.True or False ?Processed foods have more sodium than fresh foods.  True or False ?Canned Vegetables have less sodium than froze True or False ? ? ?WAYS TO DECREASE YOUR SODIUM INTAKE ?Avoid the use of added salt in cooking and at the table.  Table salt (and other prepared seasonings which contain salt) is probably one of the greatest sources of sodium in the diet.  Unsalted foods can gain flavor from the sweet, sour, and butter taste sensations of herbs and spices.  Instead of using salt for seasoning, try the following seasonings with the foods listed.  Remember: how you use them to enhance natural food flavors is limited only by your creativity... ?Allspice-Meat,  fish, eggs, fruit, peas, red and yellow vegetables ?Almond Extract-Fruit baked goods ?Anise Seed-Sweet breads, fruit, carrots, beets, cottage cheese, cookies (tastes like licorice) ?Basil-Meat, fish, eggs, vegetables, rice, vegetables salads, soups, sauces ?Bay Leaf-Meat, fish, stews, poultry ?Burnet-Salad, vegetables (cucumber-like flavor) ?Caraway Seed-Bread, cookies, cottage cheese, meat, vegetables, cheese, rice ?Cardamon-Baked goods, fruit, soups ?Celery Powder or seed-Salads, salad dressings, sauces, meatloaf, soup, bread.Do not use  celery salt ?Chervil-Meats,  salads, fish, eggs, vegetables, cottage cheese (parsley-like flavor) ?Chili Power-Meatloaf, chicken cheese, corn, eggplant, egg dishes ?Chives-Salads cottage cheese, egg dishes, soups, vegetables, sauces ?Cilantro-S

## 2021-12-05 NOTE — Telephone Encounter (Signed)
This encounter was created in error - please disregard.

## 2021-12-18 NOTE — Progress Notes (Signed)
? ?Office Visit Note ? ?Patient: Jeremy Hendricks             ?Date of Birth: 02/10/1956           ?MRN: [id number]             ?PCP: Wendie Agreste, MD ?Referring: Wendie Agreste, MD ?Visit Date: 12/22/2021 ?Occupation: '@GUAROCC'$ @ ? ?Subjective:  ?Joint stiffness ? ?History of Present Illness: LIBRADO GUANDIQUE is a 66 y.o. male with history of gout and osteoarthritis.  He denies having any gout flares since the last visit.  He states that he has some stiffness in his joints in the morning.  He notices discomfort in his joints after playing tennis or doing workout otherwise he has not had any joint discomfort.  Continues to have some stiffness in his lower back. ? ?Activities of Daily Living:  ?Patient reports morning stiffness for 5 minutes.   ?Patient Denies nocturnal pain.  ?Difficulty dressing/grooming: Denies ?Difficulty climbing stairs: Denies ?Difficulty getting out of chair: Denies ?Difficulty using hands for taps, buttons, cutlery, and/or writing: Denies ? ?Review of Systems  ?Constitutional:  Negative for fatigue.  ?HENT:  Negative for mouth dryness.   ?Eyes:  Negative for dryness.  ?Respiratory:  Negative for shortness of breath and difficulty breathing.   ?Cardiovascular:  Positive for palpitations. Negative for chest pain.  ?Gastrointestinal:  Negative for abdominal pain, constipation and diarrhea.  ?Endocrine: Positive for increased urination.  ?Genitourinary:  Negative for involuntary urination and urgency.  ?Musculoskeletal:  Positive for joint pain, joint pain and morning stiffness. Negative for myalgias and myalgias.  ?Skin:  Negative for color change, rash and sensitivity to sunlight.  ?Allergic/Immunologic: Negative for susceptible to infections.  ?Neurological:  Positive for parasthesias. Negative for dizziness, numbness and headaches.  ?Hematological:  Negative for swollen glands.  ?Psychiatric/Behavioral:  Negative for depressed mood and sleep disturbance. The patient is not nervous/anxious.    ? ?PMFS History:  ?Patient Active Problem List  ? Diagnosis Date Noted  ? Skin infection 10/09/2018  ? Chronic diastolic CHF (congestive heart failure) ([place]) 09/23/2018  ? CKD (chronic kidney disease) stage 2, GFR 60-89 ml/min 09/23/2018  ? History of ETT   ? Gout   ? DJD (degenerative joint disease)   ? Coronary artery disease   ? Atrial fibrillation ([place])   ? 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 HLD (hyperlipidemia) 06/11/2016  ? Hypogonadism in male 09/09/2015  ? INSOMNIA 09/19/2010  ? SHORTNESS OF BREATH 09/19/2010  ? CHEST PAIN-PRECORDIAL 08/23/2010  ? CAD, ARTERY BYPASS GRAFT 10/26/2009  ? HYPERLIPIDEMIA 10/24/2009  ? DEGENERATIVE JOINT DISEASE 10/24/2009  ? ANGINA, HX OF 10/24/2009  ?  ?Past Medical History:  ?Diagnosis Date  ? Atrial fibrillation ([place])   ? postoperative  ? Chronic diastolic CHF (congestive heart failure) ([place])   ? CKD (chronic kidney disease), stage II   ? Coronary artery disease   ? a. s/p CABG 2002. b. s/p redo 2012.  ? DJD (degenerative joint disease)   ? Gout   ? History of ETT   ? a. ETT 6/16:  normal  ?  ?Family History  ?Problem Relation Age of Onset  ? Heart attack Father 91  ? Hypertension Father   ? Cancer Mother   ?     Breast cancer  ? Cancer Other   ?     family hx of  ? Coronary artery disease Other   ?     family hx of  ? Hyperlipidemia Other   ?  family hx of  ? Hypertension Paternal Grandfather   ? Healthy Son   ? Healthy Daughter   ? Stroke Neg Hx   ? Colon cancer Neg Hx   ? ?Past Surgical History:  ?Procedure Laterality Date  ? CORONARY ARTERY BYPASS GRAFT  2002  ? CORONARY ARTERY BYPASS GRAFT  08-2010  ? L-LAD remained from original CABG; new grafts incl L radial- PDA + RIMA-RI  ? VASECTOMY    ? ?Social History  ? ?Social History Narrative  ? Single  ? Education: College  ? Exercise: Yes  ? ?Immunization History  ?Administered Date(s) Administered  ? Hepatitis B 09/20/2000  ? Influenza Split 05/14/2015, 06/11/2016  ? Influenza,inj,Quad PF,6+ Mos 04/04/2017, 04/15/2018,  05/05/2019, 04/20/2020  ? Influenza-Unspecified 04/04/2017  ? PFIZER Comirnaty(Gray Top)Covid-19 Tri-Sucrose Vaccine 12/29/2020  ? PFIZER(Purple Top)SARS-COV-2 Vaccination 10/24/2019, 11/17/2019, 06/09/2020  ? Pension scheme manager 61yr & up 05/16/2021  ? Tdap 02/11/2017  ? Zoster, Live 08/13/2013  ?  ? ?Objective: ?Vital Signs: BP 132/74 (BP Location: Left Arm, Patient Position: Sitting, Cuff Size: Normal)   Pulse 65   Ht '5\' 7"'$  (1.702 m)   Wt 187 lb 3.2 oz (84.9 kg)   BMI 29.32 kg/m?   ? ?Physical Exam ?Vitals and nursing note reviewed.  ?Constitutional:   ?   Appearance: He is well-developed.  ?HENT:  ?   Head: Normocephalic and atraumatic.  ?Eyes:  ?   Conjunctiva/sclera: Conjunctivae normal.  ?   Pupils: Pupils are equal, round, and reactive to light.  ?Cardiovascular:  ?   Rate and Rhythm: Normal rate and regular rhythm.  ?   Heart sounds: Normal heart sounds.  ?Pulmonary:  ?   Effort: Pulmonary effort is normal.  ?   Breath sounds: Normal breath sounds.  ?Abdominal:  ?   General: Bowel sounds are normal.  ?   Palpations: Abdomen is soft.  ?Musculoskeletal:  ?   Cervical back: Normal range of motion and neck supple.  ?Skin: ?   General: Skin is warm and dry.  ?   Capillary Refill: Capillary refill takes less than 2 seconds.  ?Neurological:  ?   Mental Status: He is alert and oriented to person, place, and time.  ?Psychiatric:     ?   Behavior: Behavior normal.  ?  ? ?Musculoskeletal Exam: C-spine was in good range of motion.  Lumbar spine was in limited range of motion with some discomfort.  Shoulder joints, elbow joints, wrist joints, MCPs PIPs and DIPs with good range of motion.  He had bilateral DIP thickening.  Hip joints and knee joints with good range of motion without any warmth swelling or effusion.  He had no tenderness over ankles or MTPs. ? ?CDAI Exam: ?CDAI Score: -- ?Patient Global: --; Provider Global: -- ?Swollen: --; Tender: -- ?Joint Exam 12/22/2021  ? ?No joint exam has  been documented for this visit  ? ?There is currently no information documented on the homunculus. Go to the Rheumatology activity and complete the homunculus joint exam. ? ?Investigation: ?No additional findings. ? ?Imaging: ?No results found. ? ?Recent Labs: ?Lab Results  ?Component Value Date  ? WBC 7.7 11/01/2021  ? HGB 16.6 11/01/2021  ? PLT 208 11/01/2021  ? NA 140 11/01/2021  ? K 4.9 11/01/2021  ? CL 101 11/01/2021  ? CO2 31 11/01/2021  ? GLUCOSE 85 11/01/2021  ? BUN 18 11/01/2021  ? CREATININE 1.44 (H) 11/01/2021  ? BILITOT 0.5 11/01/2021  ? ALKPHOS 54 07/20/2021  ?  AST 18 11/01/2021  ? ALT 14 11/01/2021  ? PROT 7.5 11/01/2021  ? ALBUMIN 4.5 07/20/2021  ? CALCIUM 9.8 11/01/2021  ? GFRAA 65 12/08/2020  ? ? ?Speciality Comments: No specialty comments available. ? ?Procedures:  ?No procedures performed ?Allergies: Patient has no known allergies.  ? ?Assessment / Plan:     ?Visit Diagnoses: Idiopathic chronic gout of multiple sites without tophus -he denies having any gout flares since the last visit.  Dietary modifications were discussed.  He is on allopurinol 450 mg p.o. daily, colchicine. uric acid: 11/01/2021 5.0.  He was advised to get repeat labs in September. ? ?Medication monitoring encounter-Labs obtained on May 05, 2022 were normal except creatinine elevated at 1.44. ? ?Pain in left foot -the pain in left foot has improved.  X-rays obtained in the past showed first MTP, PIP and DIP narrowing.  Posterior calcaneal spur was noted. ? ?Subacromial bursitis of right shoulder joint-he has off-and-on discomfort in his shoulder after playing tennis.  He had good range of motion today. ? ?Primary osteoarthritis of both hands-he had bilateral DIP thickening.  No synovitis was noted. ? ?Primary osteoarthritis of both feet-he has some discomfort in his feet after doing activities but no swelling. ? ?DDD (degenerative disc disease), lumbar-he had limited range of motion without discomfort.  I offered physical  therapy which she declined.  Handout on back exercises was given. ? ?Neuropathy-CCS have improved per patient. ? ?Other medical problems are listed as follows: ? ?CKD (chronic kidney disease) stage 2, GFR 60-89 m

## 2021-12-19 ENCOUNTER — Ambulatory Visit: Payer: BC Managed Care – PPO | Admitting: Rheumatology

## 2021-12-20 ENCOUNTER — Ambulatory Visit: Payer: BC Managed Care – PPO | Admitting: Family Medicine

## 2021-12-20 ENCOUNTER — Other Ambulatory Visit: Payer: Self-pay | Admitting: Rheumatology

## 2021-12-20 NOTE — Telephone Encounter (Signed)
Next Visit: 12/22/2021 ? ?Last Visit: 11/01/2021 ? ?Last Fill: 09/22/2021 ? ?DX: Idiopathic chronic gout of multiple sites without tophus ? ?Current Dose per office note 11/01/2021: allopurinol 300 mg p.o. daily ? ?Labs: 11/01/2021 Monocyte count is mildly elevated and stable.  Creatinine is elevated, most likely due to diuretic use.   Uric acid is 5.0 in desirable range. ? ?Okay to refill Allopurinol?  ?

## 2021-12-22 ENCOUNTER — Encounter: Payer: Self-pay | Admitting: Rheumatology

## 2021-12-22 ENCOUNTER — Ambulatory Visit: Payer: Medicare PPO | Admitting: Rheumatology

## 2021-12-22 VITALS — BP 132/74 | HR 65 | Ht 67.0 in | Wt 187.2 lb

## 2021-12-22 DIAGNOSIS — M1A09X0 Idiopathic chronic gout, multiple sites, without tophus (tophi): Secondary | ICD-10-CM | POA: Diagnosis not present

## 2021-12-22 DIAGNOSIS — G4709 Other insomnia: Secondary | ICD-10-CM

## 2021-12-22 DIAGNOSIS — M19072 Primary osteoarthritis, left ankle and foot: Secondary | ICD-10-CM

## 2021-12-22 DIAGNOSIS — M7551 Bursitis of right shoulder: Secondary | ICD-10-CM | POA: Diagnosis not present

## 2021-12-22 DIAGNOSIS — M19042 Primary osteoarthritis, left hand: Secondary | ICD-10-CM

## 2021-12-22 DIAGNOSIS — Z5181 Encounter for therapeutic drug level monitoring: Secondary | ICD-10-CM

## 2021-12-22 DIAGNOSIS — M19071 Primary osteoarthritis, right ankle and foot: Secondary | ICD-10-CM

## 2021-12-22 DIAGNOSIS — I5032 Chronic diastolic (congestive) heart failure: Secondary | ICD-10-CM

## 2021-12-22 DIAGNOSIS — G629 Polyneuropathy, unspecified: Secondary | ICD-10-CM

## 2021-12-22 DIAGNOSIS — M5136 Other intervertebral disc degeneration, lumbar region: Secondary | ICD-10-CM

## 2021-12-22 DIAGNOSIS — B351 Tinea unguium: Secondary | ICD-10-CM

## 2021-12-22 DIAGNOSIS — Z8639 Personal history of other endocrine, nutritional and metabolic disease: Secondary | ICD-10-CM

## 2021-12-22 DIAGNOSIS — Z8679 Personal history of other diseases of the circulatory system: Secondary | ICD-10-CM

## 2021-12-22 DIAGNOSIS — M19041 Primary osteoarthritis, right hand: Secondary | ICD-10-CM

## 2021-12-22 DIAGNOSIS — M79672 Pain in left foot: Secondary | ICD-10-CM | POA: Diagnosis not present

## 2021-12-22 DIAGNOSIS — N182 Chronic kidney disease, stage 2 (mild): Secondary | ICD-10-CM

## 2021-12-22 DIAGNOSIS — I25810 Atherosclerosis of coronary artery bypass graft(s) without angina pectoris: Secondary | ICD-10-CM

## 2021-12-22 NOTE — Patient Instructions (Addendum)
Labs in September- CBC with differential, CMP with GFR, uric acid ? ? ?Back Exercises ?The following exercises strengthen the muscles that help to support the trunk (torso) and back. They also help to keep the lower back flexible. Doing these exercises can help to prevent or lessen existing low back pain. ?If you have back pain or discomfort, try doing these exercises 2-3 times each day or as told by your health care provider. ?As your pain improves, do them once each day, but increase the number of times that you repeat the steps for each exercise (do more repetitions). ?To prevent the recurrence of back pain, continue to do these exercises once each day or as told by your health care provider. ?Do exercises exactly as told by your health care provider and adjust them as directed. It is normal to feel mild stretching, pulling, tightness, or discomfort as you do these exercises, but you should stop right away if you feel sudden pain or your pain gets worse. ?Exercises ?Single knee to chest ?Repeat these steps 3-5 times for each leg: ?Lie on your back on a firm bed or the floor with your legs extended. ?Bring one knee to your chest. Your other leg should stay extended and in contact with the floor. ?Hold your knee in place by grabbing your knee or thigh with both hands and hold. ?Pull on your knee until you feel a gentle stretch in your lower back or buttocks. ?Hold the stretch for 10-30 seconds. ?Slowly release and straighten your leg. ? ?Pelvic tilt ?Repeat these steps 5-10 times: ?Lie on your back on a firm bed or the floor with your legs extended. ?Bend your knees so they are pointing toward the ceiling and your feet are flat on the floor. ?Tighten your lower abdominal muscles to press your lower back against the floor. This motion will tilt your pelvis so your tailbone points up toward the ceiling instead of pointing to your feet or the floor. ?With gentle tension and even breathing, hold this position for 5-10  seconds. ? ?Cat-cow ?Repeat these steps until your lower back becomes more flexible: ?Get into a hands-and-knees position on a firm bed or the floor. Keep your hands under your shoulders, and keep your knees under your hips. You may place padding under your knees for comfort. ?Let your head hang down toward your chest. Contract your abdominal muscles and point your tailbone toward the floor so your lower back becomes rounded like the back of a cat. ?Hold this position for 5 seconds. ?Slowly lift your head, let your abdominal muscles relax, and point your tailbone up toward the ceiling so your back forms a sagging arch like the back of a cow. ?Hold this position for 5 seconds. ? ?Press-ups ?Repeat these steps 5-10 times: ?Lie on your abdomen (face-down) on a firm bed or the floor. ?Place your palms near your head, about shoulder-width apart. ?Keeping your back as relaxed as possible and keeping your hips on the floor, slowly straighten your arms to raise the top half of your body and lift your shoulders. Do not use your back muscles to raise your upper torso. You may adjust the placement of your hands to make yourself more comfortable. ?Hold this position for 5 seconds while you keep your back relaxed. ?Slowly return to lying flat on the floor. ? ?Bridges ?Repeat these steps 10 times: ?Lie on your back on a firm bed or the floor. ?Bend your knees so they are pointing toward the ceiling  and your feet are flat on the floor. Your arms should be flat at your sides, next to your body. ?Tighten your buttocks muscles and lift your buttocks off the floor until your waist is at almost the same height as your knees. You should feel the muscles working in your buttocks and the back of your thighs. If you do not feel these muscles, slide your feet 1-2 inches (2.5-5 cm) farther away from your buttocks. ?Hold this position for 3-5 seconds. ?Slowly lower your hips to the starting position, and allow your buttocks muscles to relax  completely. ?If this exercise is too easy, try doing it with your arms crossed over your chest. ?Abdominal crunches ?Repeat these steps 5-10 times: ?Lie on your back on a firm bed or the floor with your legs extended. ?Bend your knees so they are pointing toward the ceiling and your feet are flat on the floor. ?Cross your arms over your chest. ?Tip your chin slightly toward your chest without bending your neck. ?Tighten your abdominal muscles and slowly raise your torso high enough to lift your shoulder blades a tiny bit off the floor. Avoid raising your torso higher than that because it can put too much stress on your lower back and does not help to strengthen your abdominal muscles. ?Slowly return to your starting position. ? ?Back lifts ?Repeat these steps 5-10 times: ?Lie on your abdomen (face-down) with your arms at your sides, and rest your forehead on the floor. ?Tighten the muscles in your legs and your buttocks. ?Slowly lift your chest off the floor while you keep your hips pressed to the floor. Keep the back of your head in line with the curve in your back. Your eyes should be looking at the floor. ?Hold this position for 3-5 seconds. ?Slowly return to your starting position. ? ?Contact a health care provider if: ?Your back pain or discomfort gets much worse when you do an exercise. ?Your worsening back pain or discomfort does not lessen within 2 hours after you exercise. ?If you have any of these problems, stop doing these exercises right away. Do not do them again unless your health care provider says that you can. ?Get help right away if: ?You develop sudden, severe back pain. If this happens, stop doing the exercises right away. Do not do them again unless your health care provider says that you can. ?This information is not intended to replace advice given to you by your health care provider. Make sure you discuss any questions you have with your health care provider. ?Document Revised: 01/24/2021  Document Reviewed: 10/12/2020 ?Elsevier Patient Education ? [place]. ? ?

## 2021-12-27 ENCOUNTER — Other Ambulatory Visit: Payer: Self-pay | Admitting: Cardiovascular Disease

## 2021-12-28 ENCOUNTER — Encounter: Payer: Self-pay | Admitting: Family Medicine

## 2021-12-28 ENCOUNTER — Ambulatory Visit (INDEPENDENT_AMBULATORY_CARE_PROVIDER_SITE_OTHER): Payer: Medicare PPO | Admitting: Family Medicine

## 2021-12-28 ENCOUNTER — Other Ambulatory Visit (HOSPITAL_COMMUNITY)
Admission: RE | Admit: 2021-12-28 | Discharge: 2021-12-28 | Disposition: A | Discharge status: Home / Self Care | Payer: Medicare PPO | Source: Ambulatory Visit | Attending: Family Medicine | Admitting: Family Medicine

## 2021-12-28 VITALS — BP 122/66 | HR 63 | Temp 98.0°F | Resp 16 | Ht 67.0 in | Wt 187.0 lb

## 2021-12-28 DIAGNOSIS — Z5181 Encounter for therapeutic drug level monitoring: Secondary | ICD-10-CM | POA: Diagnosis not present

## 2021-12-28 DIAGNOSIS — Z113 Encounter for screening for infections with a predominantly sexual mode of transmission: Secondary | ICD-10-CM | POA: Diagnosis present

## 2021-12-28 DIAGNOSIS — R7989 Other specified abnormal findings of blood chemistry: Secondary | ICD-10-CM

## 2021-12-28 DIAGNOSIS — E291 Testicular hypofunction: Secondary | ICD-10-CM | POA: Diagnosis not present

## 2021-12-28 DIAGNOSIS — L03012 Cellulitis of left finger: Secondary | ICD-10-CM

## 2021-12-28 DIAGNOSIS — B351 Tinea unguium: Secondary | ICD-10-CM | POA: Diagnosis not present

## 2021-12-28 DIAGNOSIS — Z79899 Other long term (current) drug therapy: Secondary | ICD-10-CM

## 2021-12-28 LAB — BASIC METABOLIC PANEL
BUN: 18 mg/dL (ref 6–23)
CO2: 33 mEq/L — ABNORMAL HIGH (ref 19–32)
Calcium: 9.8 mg/dL (ref 8.4–10.5)
Chloride: 99 mEq/L (ref 96–112)
Creatinine, Ser: 1.4 mg/dL (ref 0.40–1.50)
GFR: 52.9 mL/min — ABNORMAL LOW (ref >60.00)
Glucose, Bld: 105 mg/dL — ABNORMAL HIGH (ref 70–99)
Potassium: 4.6 mEq/L (ref 3.5–5.1)
Sodium: 140 mEq/L (ref 135–145)

## 2021-12-28 LAB — LIPID PANEL
Cholesterol: 75 mg/dL (ref 0–200)
HDL: 23.7 mg/dL — ABNORMAL LOW (ref >39.00)
LDL Cholesterol: 20 mg/dL (ref 0–99)
NonHDL: 51.51
Total CHOL/HDL Ratio: 3
Triglycerides: 160 mg/dL — ABNORMAL HIGH (ref 0.0–149.0)
VLDL: 32 mg/dL (ref 0.0–40.0)

## 2021-12-28 LAB — TESTOSTERONE: Testosterone: 501.85 ng/dL (ref 300.00–890.00)

## 2021-12-28 LAB — PSA: PSA: 2.35 ng/mL (ref 0.10–4.00)

## 2021-12-28 MED ORDER — TERBINAFINE HCL 250 MG PO TABS: 250.0000 mg | ORAL_TABLET | Freq: Every day | ORAL | 0 refills | Status: DC

## 2021-12-28 MED ORDER — EMTRICITABINE-TENOFOVIR DF 200-300 MG PO TABS: ORAL_TABLET | ORAL | 2 refills | Status: DC

## 2021-12-28 NOTE — Patient Instructions (Addendum)
Lamisil daily for toe fungus. Return if not improving. I will refer you to dermatology for fingernail issue.   I will let you know if there are any concerns on labs.  No medication changes today.  For the left finger issue, I would recommend avoiding any creams or ointments or treatments for for now and I will refer you to dermatology to evaluate it further.  Okay to use over-the-counter hydrating lotion like Eucerin or Aveeno for now.

## 2021-12-28 NOTE — Progress Notes (Signed)
Subjective:  Patient ID: Jeremy Hendricks, male    DOB: Mar 12, 1956  Age: 66 y.o. MRN: [id number]  CC:  Chief Complaint  Patient presents with   Nail Problem    Pt reports had been given something for nail fungus and ran out but reports the nail issue is not resolved wondered if there was anything more he could do.    Medication Refill    Pt needs refill on truvada no concerns has been well.    Gout    Pt is in need of refill allopurinol     HPI Jeremy Hendricks presents for   Onychomycosis For years.  Tried topicals prior - no relief.  Saw Kathrin Ruddy 07/20/21. Rx lamisil '250mg'$  qd. Started to improve. Ran out after 84 tablets. No side effects on meds.  Lab Results  Component Value Date   ALT 14 11/01/2021   AST 18 11/01/2021   ALKPHOS 54 07/20/2021   BILITOT 0.5 11/01/2021    Left 3rd finger skin issue Possible wart on nail fold, treats with salicyclic acid, gets better then returns. Used treatment few days ago. Same sx's for 3 years. May have started with cleaning of home.   Hypogonadism:  testosterone 30 mg solution daily. Working well. Not fasting. Recent cbc, cmp.  Lab Results  Component Value Date   TESTOSTERONE 92.70 (L) 06/22/2021   Lab Results  Component Value Date   PSA1 1.5 05/23/2020   PSA1 1.4 03/12/2019   PSA1 1.8 04/15/2018   PSA 1.53 06/22/2021   PSA 0.9 03/29/2016    Lab Results  Component Value Date   CHOL 94 06/22/2021   HDL 27.10 (L) 06/22/2021   LDLCALC 39 06/22/2021   TRIG 140.0 06/22/2021   CHOLHDL 3 06/22/2021   Lab Results  Component Value Date   WBC 7.7 11/01/2021   HGB 16.6 11/01/2021   HCT 50.4 (H) 11/01/2021   MCV 95.3 11/01/2021   PLT 208 11/01/2021   Idiopathic chronic gout of multiple sites without tophus Followed by rheumatology with last visit March 22.  Note reviewed.  Prednisone for flares as needed.  Continue allopurinol 450 mg daily.  Colchicine if needed. No recent flare.   HIV preexposure prophylaxis Takes  Truvada daily. Unprotected partners since last sti testing:3  No new symptoms.  Hx of CKD.  Creat 1.27 06/2021, increased on March labs.  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 s/p CABG 2002. b. s/p redo 2012.   DJD (degenerative joint  disease)    Gout    History of ETT    a. ETT 6/16:  normal   Past Surgical History:  Procedure Laterality Date   CORONARY ARTERY BYPASS GRAFT  2002   CORONARY ARTERY BYPASS GRAFT  08-2010   L-LAD remained from original CABG; new grafts incl L radial- PDA + RIMA-RI   VASECTOMY     No Known Allergies Prior to Admission medications   Medication Sig Start Date End Date Taking? Authorizing Provider  albuterol (VENTOLIN HFA) 108 (90 Base) MCG/ACT inhaler Inhale 2 puffs into the lungs every 6 (six) hours as needed for wheezing or shortness of breath. 08/21/21  Yes Brunetta Jeans, PA-C  allopurinol (ZYLOPRIM) 300 MG tablet TAKE 1 TABLET BY MOUTH EVERY DAY 12/20/21  Yes Ofilia Neas, PA-C  atenolol (TENORMIN) 25 MG tablet TAKE 1 TABLET BY MOUTH  EVERY DAY 11/08/21  Yes Burnell Blanks, MD  Colchicine 0.6 MG CAPS TAKE 1 TABLETS ON FIRST DAY OF FLARE UP. THEN TAKE 1 DAILY UNTIL FLARE UP RESOLVED 11/01/21  Yes Deveshwar, Abel Presto, MD  emtricitabine-tenofovir (TRUVADA) 200-300 MG tablet TAKE ONE TABLET BY MOUTH ONCE DAILY WITH OR WITHOUT FOOD. STORE IN ORIGINAL CONTAINER AT ROOM TEMPERATURE. 06/22/21  Yes Wendie Agreste, MD  furosemide (LASIX) 20 MG tablet Take 1 tablet (20 mg total) by mouth every other day. Patient taking differently: Take 10 mg by mouth every other day. 12/05/21  Yes Imogene Burn, PA-C  HYDROcodone bit-homatropine (HYCODAN) 5-1.5 MG/5ML syrup Take 5 mLs by mouth at bedtime as needed for cough. 08/29/21  Yes Maximiano Coss, NP  Multiple Vitamin (MULTIVITAMIN) tablet Take 1 tablet by mouth daily.   Yes [provider]  mupirocin ointment (BACTROBAN) 2 % Apply topically 2 (two) times daily. 06/19/21  Yes Midge Minium, MD  nitroGLYCERIN (NITROSTAT) 0.4 MG SL tablet Place 0.4 mg under the tongue every 5 (five) minutes as needed for chest pain. Reported on 09/21/2015   Yes [provider]  rosuvastatin (CRESTOR) 40 MG tablet TAKE 1 TABLET BY MOUTH EVERY DAY 12/27/21  Yes Burnell Blanks, MD  Testosterone 30 MG/ACT SOLN APPLY 1 PUMP EVERY DAY 06/22/21  Yes Wendie Agreste, MD  valACYclovir (VALTREX) 500 MG tablet TAKE 1 TABLET (500 MG TOTAL) BY MOUTH DAILY. Patient taking differently: Take 500 mg by mouth as needed. 08/16/21  Yes Wendie Agreste, MD   Social History   Socioeconomic History   Marital status: Single    Spouse name: Not on file   Number of children: 2   Years of education: Not on file   Highest education level: Not on file  Occupational History   Occupation: Realtor  Tobacco Use   Smoking status: Former    Types: Cigarettes    Quit date: 08/14/1999    Years since quitting: 22.3    Passive exposure: Past   Smokeless tobacco: Never   Tobacco comments:    Social  smoker  Vaping Use   Vaping Use: Never used  Substance and Sexual Activity   Alcohol use: Yes    Comment: 1oz-2oz daily    Drug use: No   Sexual activity: Yes  Other Topics Concern   Not on file  Social History Narrative   Single   Education: Secretary/administrator   Exercise: Yes   Social Determinants of Health   Financial Resource Strain: Not on file  Food Insecurity: Not on file  Transportation Needs: Not on file  Physical Activity: Not  on file  Stress: Not on file  Social Connections: Not on file  Intimate Partner Violence: Not on file    Review of Systems   Objective:   Vitals:   12/28/21 1124  BP: 122/66  Pulse: 63  Resp: 16  Temp: 98 F (36.7 C)  TempSrc: Temporal  SpO2: 95%  Weight: 187 lb (84.8 kg)  Height: '5\' 7"'$  (1.702 m)     Physical Exam Vitals reviewed.  Constitutional:      Appearance: He is well-developed.  HENT:     Head: Normocephalic and atraumatic.  Neck:     Vascular: No carotid bruit or JVD.  Cardiovascular:     Rate and Rhythm: Normal rate and regular rhythm.     Heart sounds: Normal heart sounds. No murmur heard. Pulmonary:     Effort: Pulmonary effort is normal.     Breath sounds: Normal breath sounds. No rales.  Musculoskeletal:     Right lower leg: No edema.     Left lower leg: No edema.  Skin:    General: Skin is warm and dry.  Neurological:     Mental Status: He is alert and oriented to person, place, and time.  Psychiatric:        Mood and Affect: Mood normal.         Assessment & Plan:  Jeremy Hendricks is a 66 y.o. male . On pre-exposure prophylaxis for HIV - Plan: emtricitabine-tenofovir (TRUVADA) 200-300 MG tablet, Basic metabolic panel Routine screening for STI (sexually transmitted infection) - Plan: emtricitabine-tenofovir (TRUVADA) 200-300 MG tablet, terbinafine (LAMISIL) 250 MG tablet, Urine cytology ancillary only, HIV Antibody (routine testing w rflx), RPR  -Tolerating Truvada, continue same dose.  Recheck BMP given  slight bump in creatinine on recent testing.  Check STI screening.  Onychomycosis - Plan: terbinafine (LAMISIL) 250 MG tablet  -Some improvement with prior Lamisil, likely needs prolonged course.  Recent LFTs noted, terbinafine reordered.  Hypogonadism in male - Plan: PSA, Testosterone  -Stable with current regimen, recent CBC, CMP noted.  Check updated testosterone level and PSA, continue same dose for now with medication adjustments depending on lab results.  Check lipid panel.  Elevated serum creatinine - Plan: Basic metabolic panel  -As above, check BMP.  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  Treated lesion may have been verrucal/common wart but hard to discern after salicylic acid treatment.  Recommended hydrating lotion for now, avoid further salicylic acid for now and refer to dermatology.  Meds ordered this encounter  Medications   emtricitabine-tenofovir (TRUVADA) 200-300 MG tablet    Sig: TAKE ONE TABLET BY MOUTH ONCE DAILY WITH OR WITHOUT FOOD. STORE IN ORIGINAL CONTAINER AT ROOM TEMPERATURE.    Dispense:  90 tablet    Refill:  2   terbinafine (LAMISIL) 250 MG tablet    Sig: Take 1 tablet (250 mg total) by mouth daily.    Dispense:  90 tablet    Refill:  0   Patient Instructions  Lamisil daily for toe fungus. Return if not improving. I will refer you to dermatology for fingernail issue.   I will let you know if there are any concerns on labs.  No medication changes today.  For the left finger issue, I would recommend avoiding any creams or ointments or treatments for for now  and I will refer you to dermatology to evaluate it further.  Okay to use over-the-counter hydrating lotion like Eucerin or Aveeno for now.        Signed,   [name],  MD [facility] 12/28/21 12:31 PM

## 2021-12-29 LAB — HIV ANTIBODY (ROUTINE TESTING W REFLEX): HIV 1&2 Ab, 4th Generation: NONREACTIVE

## 2021-12-29 LAB — URINE CYTOLOGY ANCILLARY ONLY
Chlamydia: NEGATIVE
Comment: NEGATIVE
Comment: NEGATIVE
Comment: NORMAL
Neisseria Gonorrhea: NEGATIVE
Trichomonas: NEGATIVE

## 2021-12-29 LAB — RPR: RPR Ser Ql: NONREACTIVE

## 2022-01-16 ENCOUNTER — Other Ambulatory Visit: Payer: Medicare PPO

## 2022-01-16 DIAGNOSIS — N182 Chronic kidney disease, stage 2 (mild): Secondary | ICD-10-CM

## 2022-01-16 LAB — BASIC METABOLIC PANEL
BUN/Creatinine Ratio: 13 (ref 10–24)
BUN: 17 mg/dL (ref 8–27)
CO2: 25 mmol/L (ref 20–29)
Calcium: 9.2 mg/dL (ref 8.6–10.2)
Chloride: 99 mmol/L (ref 96–106)
Creatinine, Ser: 1.35 mg/dL — ABNORMAL HIGH (ref 0.76–1.27)
Glucose: 90 mg/dL (ref 70–99)
Potassium: 4 mmol/L (ref 3.5–5.2)
Sodium: 140 mmol/L (ref 134–144)
eGFR: 58 mL/min/{1.73_m2} — ABNORMAL LOW (ref >59)

## 2022-01-20 ENCOUNTER — Other Ambulatory Visit: Payer: Self-pay | Admitting: Cardiovascular Disease

## 2022-01-26 ENCOUNTER — Encounter: Payer: Self-pay | Admitting: Family Medicine

## 2022-01-26 DIAGNOSIS — C4491 Basal cell carcinoma of skin, unspecified: Secondary | ICD-10-CM | POA: Insufficient documentation

## 2022-02-09 ENCOUNTER — Other Ambulatory Visit: Payer: Self-pay | Admitting: Cardiovascular Disease

## 2022-02-28 ENCOUNTER — Other Ambulatory Visit (HOSPITAL_COMMUNITY): Payer: Self-pay

## 2022-02-28 ENCOUNTER — Other Ambulatory Visit: Payer: Self-pay | Admitting: Cardiovascular Disease

## 2022-02-28 MED ORDER — ATENOLOL 25 MG PO TABS
25.0000 mg | ORAL_TABLET | Freq: Every day | ORAL | 2 refills | Status: DC
  Filled 2022-02-28: qty 90, 90d supply, fill #0

## 2022-03-06 ENCOUNTER — Other Ambulatory Visit: Payer: Self-pay | Admitting: Cardiovascular Disease

## 2022-03-07 ENCOUNTER — Other Ambulatory Visit (HOSPITAL_COMMUNITY): Payer: Self-pay

## 2022-03-15 ENCOUNTER — Other Ambulatory Visit: Payer: Self-pay | Admitting: Physician Assistant

## 2022-03-15 NOTE — Telephone Encounter (Signed)
Next Visit: 06/25/2022  Last Visit: 12/22/2021  Last Fill: 12/20/2021  DX: Idiopathic chronic gout of multiple sites without tophus  Current Dose per office note 12/22/2021: allopurinol 450 mg p.o. daily  Labs: 11/01/2021 Monocyte count is mildly elevated and stable.  Creatinine is elevated, most likely due to diuretic use. Uric acid is 5.0 in desirable range.   Okay to refill Allopurinol?

## 2022-03-21 ENCOUNTER — Encounter: Payer: Self-pay | Admitting: Family Medicine

## 2022-03-21 ENCOUNTER — Other Ambulatory Visit: Payer: Self-pay | Admitting: Family

## 2022-03-21 DIAGNOSIS — R7989 Other specified abnormal findings of blood chemistry: Secondary | ICD-10-CM

## 2022-03-21 DIAGNOSIS — E291 Testicular hypofunction: Secondary | ICD-10-CM

## 2022-03-21 MED ORDER — TESTOSTERONE 30 MG/ACT TD SOLN: TRANSDERMAL | 5 refills | Status: DC

## 2022-03-27 DIAGNOSIS — L814 Other melanin hyperpigmentation: Secondary | ICD-10-CM | POA: Diagnosis not present

## 2022-03-27 DIAGNOSIS — L821 Other seborrheic keratosis: Secondary | ICD-10-CM | POA: Diagnosis not present

## 2022-03-27 DIAGNOSIS — D1801 Hemangioma of skin and subcutaneous tissue: Secondary | ICD-10-CM | POA: Diagnosis not present

## 2022-03-27 DIAGNOSIS — L570 Actinic keratosis: Secondary | ICD-10-CM | POA: Diagnosis not present

## 2022-03-27 DIAGNOSIS — D225 Melanocytic nevi of trunk: Secondary | ICD-10-CM | POA: Diagnosis not present

## 2022-03-29 ENCOUNTER — Other Ambulatory Visit: Payer: Self-pay

## 2022-03-29 ENCOUNTER — Other Ambulatory Visit: Payer: Self-pay | Admitting: Family Medicine

## 2022-03-29 DIAGNOSIS — B351 Tinea unguium: Secondary | ICD-10-CM

## 2022-03-29 DIAGNOSIS — Z113 Encounter for screening for infections with a predominantly sexual mode of transmission: Secondary | ICD-10-CM

## 2022-03-29 MED ORDER — TERBINAFINE HCL 250 MG PO TABS: 250.0000 mg | ORAL_TABLET | Freq: Every day | ORAL | 0 refills | Status: DC

## 2022-04-29 ENCOUNTER — Other Ambulatory Visit: Payer: Self-pay | Admitting: Family Medicine

## 2022-04-29 DIAGNOSIS — B351 Tinea unguium: Secondary | ICD-10-CM

## 2022-04-29 DIAGNOSIS — Z113 Encounter for screening for infections with a predominantly sexual mode of transmission: Secondary | ICD-10-CM

## 2022-05-23 DIAGNOSIS — I5032 Chronic diastolic (congestive) heart failure: Secondary | ICD-10-CM | POA: Diagnosis not present

## 2022-05-23 DIAGNOSIS — N1831 Chronic kidney disease, stage 3a: Secondary | ICD-10-CM | POA: Diagnosis not present

## 2022-05-23 DIAGNOSIS — I130 Hypertensive heart and chronic kidney disease with heart failure and stage 1 through stage 4 chronic kidney disease, or unspecified chronic kidney disease: Secondary | ICD-10-CM | POA: Diagnosis not present

## 2022-05-23 DIAGNOSIS — I2510 Atherosclerotic heart disease of native coronary artery without angina pectoris: Secondary | ICD-10-CM | POA: Diagnosis not present

## 2022-05-23 DIAGNOSIS — M1000 Idiopathic gout, unspecified site: Secondary | ICD-10-CM | POA: Diagnosis not present

## 2022-05-25 ENCOUNTER — Other Ambulatory Visit: Payer: Self-pay | Admitting: Cardiovascular Disease

## 2022-05-30 ENCOUNTER — Encounter: Payer: Self-pay | Admitting: Family Medicine

## 2022-06-08 ENCOUNTER — Other Ambulatory Visit: Payer: Self-pay | Admitting: Rheumatology

## 2022-06-08 ENCOUNTER — Other Ambulatory Visit: Payer: Self-pay | Admitting: Cardiovascular Disease

## 2022-06-08 MED ORDER — FUROSEMIDE 20 MG PO TABS: 20.0000 mg | ORAL_TABLET | ORAL | 3 refills | Status: DC

## 2022-06-08 NOTE — Telephone Encounter (Signed)
Next Visit: 07/10/2022  Last Visit: 12/22/2021  Last Fill: 03/15/2022   DX: Idiopathic chronic gout of multiple sites without tophus  Current Dose per office note 12/22/2021: allopurinol 450 mg p.o. daily  Labs: 01/16/2022 Creat. 1.35, GFR 58, Uric Acid 11/01/2021 5.0, CBC Hct 50.4   Okay to refill Allopurinol?

## 2022-06-25 ENCOUNTER — Ambulatory Visit: Payer: Medicare PPO | Admitting: Rheumatology

## 2022-06-26 NOTE — Progress Notes (Signed)
Office Visit Note  Patient: Jeremy Hendricks             Date of Birth: 11/29/1955           MRN: [id number]             PCP: Wendie Agreste, MD Referring: Wendie Agreste, MD Visit Date: 07/10/2022 Occupation: '@GUAROCC'$ @  Subjective:  Medication management  History of Present Illness: Jeremy Hendricks is a 66 y.o. male with history of gouty arthropathy and osteoarthritis.  He has been taking allopurinol 300 mg a day.  He states he was previously taking allopurinol 1-1/2 tablet (450 mg).  He had some intentional weight loss.  He has not had any gout flares since the last visit.  The shoulder joint discomfort has resolved.  He has been going to workout every day and lifts weights.  He noticed some discomfort in his hands after lifting weights.  He has some lower back pain which responds to his stretching exercises.  He has been noticing  tightness in his right hip and groin muscles.  He continues to have  neuropathy.  Activities of Daily Living:  Patient reports morning stiffness for 5 minutes.   Patient Denies nocturnal pain.  Difficulty dressing/grooming: Denies Difficulty climbing stairs: Denies Difficulty getting out of chair: Denies Difficulty using hands for taps, buttons, cutlery, and/or writing: Denies  Review of Systems  Constitutional:  Negative for fatigue.  HENT:  Negative for mouth sores and mouth dryness.   Eyes:  Negative for dryness.  Respiratory:  Negative for shortness of breath.   Cardiovascular:  Negative for chest pain and palpitations.  Gastrointestinal:  Negative for blood in stool, constipation and diarrhea.  Endocrine: Negative for increased urination.  Genitourinary:  Negative for involuntary urination.  Musculoskeletal:  Positive for morning stiffness. Negative for joint pain, gait problem, joint pain, joint swelling, myalgias, muscle weakness, muscle tenderness and myalgias.  Skin:  Positive for color change. Negative for rash and sensitivity to sunlight.   Allergic/Immunologic: Negative for susceptible to infections.  Neurological:  Negative for dizziness and headaches.  Hematological:  Negative for swollen glands.  Psychiatric/Behavioral:  Negative for depressed mood and sleep disturbance. The patient is not nervous/anxious.     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 s/p CABG 2002. b. s/p redo 2012.   DJD (degenerative joint disease)    Gout    History of ETT    a. ETT 6/16:  normal    Family History  Problem Relation Age of Onset   Heart attack Father 67   Hypertension Father    Cancer  Mother        Breast cancer   Cancer Other        family hx of   Coronary artery disease Other        family hx of   Hyperlipidemia Other        family hx of   Hypertension Paternal Grandfather    Healthy Son    Healthy Daughter    Stroke Neg Hx    Colon cancer Neg Hx    Past Surgical History:  Procedure Laterality Date   CORONARY ARTERY BYPASS GRAFT  2002   CORONARY ARTERY BYPASS GRAFT  08-2010   L-LAD remained from original CABG; new grafts incl L radial- PDA + RIMA-RI   VASECTOMY     Social History   Social History Narrative   Single    Education: College   Exercise: Yes   Immunization History  Administered Date(s) Administered   Hepatitis B 09/20/2000   Influenza Split 05/14/2015, 06/11/2016   Influenza,inj,Quad PF,6+ Mos 04/04/2017, 04/15/2018, 05/05/2019, 04/20/2020   Influenza-Unspecified 04/04/2017   PFIZER Comirnaty(Gray Top)Covid-19 Tri-Sucrose Vaccine 12/29/2020   PFIZER(Purple Top)SARS-COV-2 Vaccination 10/24/2019, 11/17/2019, 06/09/2020, 05/16/2021, 02/14/2022   PNEUMOCOCCAL CONJUGATE-20 02/14/2022   Pfizer Covid-19 Vaccine Bivalent Booster 87yr & up 05/16/2021   Tdap 02/11/2017   Unspecified SARS-COV-2 Vaccination 06/01/2022   Zoster, Live 08/13/2013     Objective: Vital Signs: BP 123/72 (BP Location: Left Arm, Patient Position: Sitting, Cuff Size: Normal)   Pulse (!) 54   Resp 17   Ht '5\' 7"'$  (1.702 m)   Wt 182 lb 6.4 oz (82.7 kg)   BMI 28.57 kg/m    Physical Exam Vitals and nursing note reviewed.  Constitutional:      Appearance: He is well-developed.  HENT:     Head: Normocephalic and atraumatic.  Eyes:     Conjunctiva/sclera: Conjunctivae normal.     Pupils: Pupils are equal, round, and reactive to light.  Cardiovascular:     Rate and Rhythm: Normal rate and regular rhythm.     Heart sounds: Normal heart sounds.  Pulmonary:     Effort: Pulmonary effort is normal.     Breath sounds: Normal breath sounds.  Abdominal:     General: Bowel sounds are normal.     Palpations: Abdomen is soft.  Musculoskeletal:     Cervical back: Normal range of motion and neck supple.  Skin:    General: Skin is warm and dry.     Capillary Refill: Capillary refill takes less than 2 seconds.  Neurological:     Mental Status: He is alert and oriented to person, place, and time.  Psychiatric:        Behavior: Behavior normal.      Musculoskeletal Exam: Cervical spine was in good range of motion.  Shoulder joints, elbow joints, wrist joints, MCPs PIPs and DIPs Juengel range of motion.  He had bilateral PIP  and DIP thickening with no synovitis.  Hip joints and knee joints with good range of motion with no warmth swelling or effusion.  He had some discomfort with abduction of the right hip joint in the inguinal region.  There was no tenderness over ankles or MTPs.  CDAI Exam: CDAI Score: -- Patient Global: --; Provider Global: -- Swollen: --; Tender: -- Joint Exam 07/10/2022   No joint exam has been documented for this visit   There is currently no information documented on the homunculus. Go to the Rheumatology activity and complete the homunculus joint exam.  Investigation: No additional findings.  Imaging: No results found.  Recent Labs: Lab Results  Component Value Date   WBC 8.0 07/09/2022   HGB 16.3 07/09/2022   PLT 176.0 07/09/2022   NA 135 07/09/2022   K 4.5 07/09/2022   CL 97 07/09/2022   CO2 31 07/09/2022   GLUCOSE 100 (H) 07/09/2022   BUN 24 (H) 07/09/2022   CREATININE 1.61 (H) 07/09/2022   BILITOT 0.5 07/09/2022   ALKPHOS 62 07/09/2022   AST 20 07/09/2022   ALT 21 07/09/2022   PROT 7.3 07/09/2022   ALBUMIN 4.9 07/09/2022   CALCIUM 9.5 07/09/2022   GFRAA 65 12/08/2020    Speciality Comments: No specialty comments available.  Procedures:  No procedures performed Allergies: Patient has no known allergies.   Assessment / Plan:     Visit Diagnoses: Idiopathic chronic gout of multiple sites without tophus - allopurinol 300 mg p.o. daily, colchicine. uric acid: 5.0 on 11/01/2021. -Reduce the dose of allopurinol to 300 mg p.o. daily.  He has not experienced gout flare.  He takes colchicine only on as needed basis.  He had recent labs by his PCP which were reviewed.  We will check uric acid today.  Based on his uric acid level we will adjust the dose of allopurinol.  Dietary modifications were discussed.  Plan: Uric acid  Medication monitoring encounter-July 09, 2022 CBC and CMP were normal except for creatinine being elevated at 1.61.  Which could be due to  diuretic use.  Patient states his PCP is aware of the elevated creatinine.  Primary osteoarthritis of both hands-he has bilateral PIP and DIP thickening.  He lifts weights which causes stiffness.  He has not noticed any swelling.  Protection muscle strengthening was discussed.  Pain in right hip-he complains of right hip discomfort with abduction.  He believes is due to inguinal muscle tightness.  I offered x-rays which she declined.  He will try to do stretching exercises and will let me know at the follow-up visit.  Advised him to contact me if his symptoms get worse.  Primary osteoarthritis of both feet-proper fitting shoes were advised.  DDD (degenerative disc disease), lumbar-he has off-and-on discomfort in the lower back.  Other medical problems are listed as follows:  Neuropathy  CKD (chronic kidney disease) stage 2, GFR 60-89 ml/min-his creatinine was elevated at 1.61.  He will discuss this further with his PCP.  Atherosclerosis of coronary artery bypass graft of native heart without angina pectoris  Chronic diastolic CHF (congestive heart failure) (HCC)  History of atrial fibrillation  History of hyperlipidemia  Onychomycosis  Other insomnia  Orders: Orders Placed This Encounter  Procedures   Uric acid   No orders of the defined types were placed in this encounter.    Follow-Up Instructions: Return in about 6 months (around 01/08/2023) for Gout.   Bo Merino, MD  Note - This record has been created using Editor, commissioning.  Chart creation errors have been sought, but may not always  have been located. Such creation errors do not reflect on  the standard of medical care.

## 2022-07-09 ENCOUNTER — Ambulatory Visit (INDEPENDENT_AMBULATORY_CARE_PROVIDER_SITE_OTHER): Payer: Medicare PPO | Admitting: Family Medicine

## 2022-07-09 ENCOUNTER — Encounter: Payer: Self-pay | Admitting: Family Medicine

## 2022-07-09 VITALS — BP 124/70 | HR 56 | Temp 98.1°F | Ht 67.0 in | Wt 181.6 lb

## 2022-07-09 DIAGNOSIS — G6289 Other specified polyneuropathies: Secondary | ICD-10-CM

## 2022-07-09 DIAGNOSIS — H547 Unspecified visual loss: Secondary | ICD-10-CM | POA: Diagnosis not present

## 2022-07-09 DIAGNOSIS — Z113 Encounter for screening for infections with a predominantly sexual mode of transmission: Secondary | ICD-10-CM

## 2022-07-09 DIAGNOSIS — L819 Disorder of pigmentation, unspecified: Secondary | ICD-10-CM

## 2022-07-09 DIAGNOSIS — B009 Herpesviral infection, unspecified: Secondary | ICD-10-CM | POA: Diagnosis not present

## 2022-07-09 DIAGNOSIS — R7989 Other specified abnormal findings of blood chemistry: Secondary | ICD-10-CM

## 2022-07-09 DIAGNOSIS — Z0000 Encounter for general adult medical examination without abnormal findings: Secondary | ICD-10-CM

## 2022-07-09 DIAGNOSIS — R200 Anesthesia of skin: Secondary | ICD-10-CM

## 2022-07-09 DIAGNOSIS — Z79899 Other long term (current) drug therapy: Secondary | ICD-10-CM | POA: Diagnosis not present

## 2022-07-09 DIAGNOSIS — E291 Testicular hypofunction: Secondary | ICD-10-CM | POA: Diagnosis not present

## 2022-07-09 LAB — LIPID PANEL
Cholesterol: 88 mg/dL (ref 0–200)
HDL: 23.9 mg/dL — ABNORMAL LOW (ref >39.00)
LDL Cholesterol: 43 mg/dL (ref 0–99)
NonHDL: 64.34
Total CHOL/HDL Ratio: 4
Triglycerides: 109 mg/dL (ref 0.0–149.0)
VLDL: 21.8 mg/dL (ref 0.0–40.0)

## 2022-07-09 LAB — COMPREHENSIVE METABOLIC PANEL
ALT: 21 U/L (ref 0–53)
AST: 20 U/L (ref 0–37)
Albumin: 4.9 g/dL (ref 3.5–5.2)
Alkaline Phosphatase: 62 U/L (ref 39–117)
BUN: 24 mg/dL — ABNORMAL HIGH (ref 6–23)
CO2: 31 mEq/L (ref 19–32)
Calcium: 9.5 mg/dL (ref 8.4–10.5)
Chloride: 97 mEq/L (ref 96–112)
Creatinine, Ser: 1.61 mg/dL — ABNORMAL HIGH (ref 0.40–1.50)
GFR: 44.57 mL/min — ABNORMAL LOW (ref >60.00)
Glucose, Bld: 100 mg/dL — ABNORMAL HIGH (ref 70–99)
Potassium: 4.5 mEq/L (ref 3.5–5.1)
Sodium: 135 mEq/L (ref 135–145)
Total Bilirubin: 0.5 mg/dL (ref 0.2–1.2)
Total Protein: 7.3 g/dL (ref 6.0–8.3)

## 2022-07-09 LAB — PSA: PSA: 1.75 ng/mL (ref 0.10–4.00)

## 2022-07-09 LAB — CBC WITH DIFFERENTIAL/PLATELET
Basophils Absolute: 0 10*3/uL (ref 0.0–0.1)
Basophils Relative: 0.6 % (ref 0.0–3.0)
Eosinophils Absolute: 0.2 10*3/uL (ref 0.0–0.7)
Eosinophils Relative: 2 % (ref 0.0–5.0)
HCT: 48.4 % (ref 39.0–52.0)
Hemoglobin: 16.3 g/dL (ref 13.0–17.0)
Lymphocytes Relative: 28 % (ref 12.0–46.0)
Lymphs Abs: 2.2 10*3/uL (ref 0.7–4.0)
MCHC: 33.6 g/dL (ref 30.0–36.0)
MCV: 96.2 fl (ref 78.0–100.0)
Monocytes Absolute: 1.5 10*3/uL — ABNORMAL HIGH (ref 0.1–1.0)
Monocytes Relative: 18.9 % — ABNORMAL HIGH (ref 3.0–12.0)
Neutro Abs: 4 10*3/uL (ref 1.4–7.7)
Neutrophils Relative %: 50.5 % (ref 43.0–77.0)
Platelets: 176 10*3/uL (ref 150.0–400.0)
RBC: 5.04 Mil/uL (ref 4.22–5.81)
RDW: 12.8 % (ref 11.5–15.5)
WBC: 8 10*3/uL (ref 4.0–10.5)

## 2022-07-09 LAB — VITAMIN B12: Vitamin B-12: 1202 pg/mL — ABNORMAL HIGH (ref 211–911)

## 2022-07-09 LAB — TSH: TSH: 3.26 u[IU]/mL (ref 0.35–5.50)

## 2022-07-09 LAB — TESTOSTERONE: Testosterone: 837.4 ng/dL (ref 300.00–890.00)

## 2022-07-09 MED ORDER — VALACYCLOVIR HCL 500 MG PO TABS: 500.0000 mg | ORAL_TABLET | Freq: Every day | ORAL | 1 refills | Status: DC

## 2022-07-09 MED ORDER — GABAPENTIN 100 MG PO CAPS: 100.0000 mg | ORAL_CAPSULE | Freq: Every day | ORAL | 1 refills | Status: DC

## 2022-07-09 MED ORDER — EMTRICITABINE-TENOFOVIR DF 200-300 MG PO TABS: ORAL_TABLET | ORAL | 2 refills | Status: DC

## 2022-07-09 NOTE — Progress Notes (Unsigned)
Subjective:  Patient ID: Jeremy Hendricks, male    DOB: [birth year]  Age: 66 y.o. MRN: [id number]  CC:  Chief Complaint  Patient presents with   Annual Exam    Pt states all is well  Pt is fasting     HPI Jeremy Hendricks presents for Annual Exam PCP, [facility], Dr. Angelena Form, cardiology visit in April.  History of CAD status post CABG in 2002, redo 2012, postop A-fib, chronic diastolic CHF.  GXT June 2016.  CKD with increased creatinine earlier in the year with extra furosemide dosing.  Echo 11/30/2020 LVEF with grade 2 diastolic dysfunction.  Lasix 20 mg daily at April visit, stable. Plans to discuss statin and options with some myalgias with his cardiologist.  Followed by rheumatology, Dr. Estanislado Pandy, with history of gout, prednisone for flares as needed, allopurinol 450 mg daily with colchicine if needed.  Neuropathy: Tingling, burning feeling on top of both feet. Not in hands. Noticed in last year.   Less if walking in heels. No new back pain. No radicular sx's.  Tx: none. No prior eval.  Notices more with walking more. Not bothering at night.   Hx of HSV: Takes valtrex  ? Sore in perineal area few weeks ago improved after valtrex daily. Takes as needed only.   Hypogonadism Testosterone 30 mg solution daily, 1 pump per day. Working well. No skin irritation or side effects.  Lab Results  Component Value Date   TESTOSTERONE 501.85 12/28/2021   Lab Results  Component Value Date   PSA1 1.5 05/23/2020   PSA1 1.4 03/12/2019   PSA1 1.8 04/15/2018   PSA 2.35 12/28/2021   PSA 1.53 06/22/2021   PSA 0.9 03/29/2016   Lab Results  Component Value Date   WBC 7.7 11/01/2021   HGB 16.6 11/01/2021   HCT 50.4 (H) 11/01/2021   MCV 95.3 11/01/2021   PLT 208 11/01/2021   Lab Results  Component Value Date   CHOL 75 12/28/2021   HDL 23.70 (L) 12/28/2021   LDLCALC 20 12/28/2021   TRIG 160.0 (H) 12/28/2021   CHOLHDL 3 12/28/2021    Preexposure prophylaxis for HIV Treated with  Truvada daily. Creat improved from 1.40 to 1.35 in June.  Rare nsaids. Stays hydrated. More water, less green tea. Less alcohol.  Lab Results  Component Value Date   CREATININE 1.35 (H) 01/16/2022        07/09/2022    8:18 AM 08/29/2021   11:39 AM 06/22/2021    1:20 PM 06/19/2021    9:49 AM 03/24/2021    1:43 PM  Depression screen PHQ 2/9  Decreased Interest 0 0 0 0 0  Down, Depressed, Hopeless 0  0 0 0  PHQ - 2 Score 0 0 0 0 0  Altered sleeping 0   0 0  Tired, decreased energy 1   0 0  Change in appetite 0   0 0  Feeling bad or failure about yourself  0   0 0  Trouble concentrating 0   0 0  Moving slowly or fidgety/restless 0   0 0  Suicidal thoughts 0   0 0  PHQ-9 Score 1   0 0  Difficult doing work/chores    Not difficult at all     Health Maintenance  Topic Date Due   Medicare Annual Wellness (AWV)  Never done   Zoster Vaccines- Shingrix (1 of 2) 10/09/2022 (Originally 11/29/1974)   INFLUENZA VACCINE  11/11/2022 (Originally 03/13/2022)   COVID-19  Vaccine (7 - 2023-24 season) 07/27/2022   COLONOSCOPY (Pts 45-92yr Insurance coverage will need to be confirmed)  07/02/2027   Pneumonia Vaccine 65 Years old  Completed   Hepatitis C Screening  Completed   HIV Screening  Completed   HPV VACCINES  Aged Out  Colonoscopy 06/2017-  10year repeat.  Prostate: does have family history of prostate cancer - paternal GF.  The natural history of prostate cancer and ongoing controversy regarding screening and potential treatment outcomes of prostate cancer has been discussed with the patient. The meaning of a false positive PSA and a false negative PSA has been discussed. He indicates understanding of the limitations of this screening test and wishes  to proceed with screening PSA testing. Lab Results  Component Value Date   PSA1 1.5 05/23/2020   PSA1 1.4 03/12/2019   PSA1 1.8 04/15/2018   PSA 2.35 12/28/2021   PSA 1.53 06/22/2021   PSA 0.9 03/29/2016    Immunization History   Administered Date(s) Administered   Hepatitis B 09/20/2000   Influenza Split 05/14/2015, 06/11/2016   Influenza,inj,Quad PF,6+ Mos 04/04/2017, 04/15/2018, 05/05/2019, 04/20/2020   Influenza-Unspecified 04/04/2017   PFIZER Comirnaty(Gray Top)Covid-19 Tri-Sucrose Vaccine 12/29/2020   PFIZER(Purple Top)SARS-COV-2 Vaccination 10/24/2019, 11/17/2019, 06/09/2020   PNEUMOCOCCAL CONJUGATE-20 02/14/2022   Pfizer Covid-19 Vaccine Bivalent Booster 134yr& up 05/16/2021   Tdap 02/11/2017   Unspecified SARS-COV-2 Vaccination 06/01/2022   Zoster, Live 08/13/2013  Flu vaccine at [facility].  Recent covid booster.  RSV vaccine  - link given.  Shingrix - a pharmacy - will have record sent.   No results found. None - reading glasses.   Dental:every 6 months.   Alcohol: 4 ounces per week.   Tobacco: none.   Exercise: Elliptical, bike, rowing machine 45 minutes 3 to 4 days/week as well as lifting exercises. Mostly running now. Recent 5k. Knees felt better.   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 s/p CABG 2002. b. s/p redo 2012.   DJD (degenerative joint disease)    Gout    History of ETT    a. ETT 6/16:  normal    Past Surgical History:  Procedure Laterality Date   CORONARY ARTERY BYPASS GRAFT  2002   CORONARY ARTERY BYPASS GRAFT  08-2010   L-LAD remained from original CABG; new grafts incl L radial- PDA + RIMA-RI   VASECTOMY     No Known Allergies Prior to Admission medications   Medication Sig Start Date End Date Taking? Authorizing Provider  allopurinol (ZYLOPRIM) 300 MG tablet TAKE 1 TABLET BY MOUTH EVERY DAY 06/08/22  Yes Deveshwar, ShAbel PrestoMD  atenolol (TENORMIN) 25 MG tablet TAKE  1 TABLET BY MOUTH EVERY DAY 03/06/22  Yes Burnell Blanks, MD  Colchicine 0.6 MG CAPS TAKE 1 TABLETS ON FIRST DAY OF FLARE UP. THEN TAKE 1 DAILY UNTIL FLARE UP RESOLVED 11/01/21  Yes Deveshwar, Abel Presto, MD  emtricitabine-tenofovir (TRUVADA) 200-300 MG tablet TAKE ONE TABLET BY MOUTH ONCE DAILY WITH OR WITHOUT FOOD. STORE IN ORIGINAL CONTAINER AT ROOM TEMPERATURE. 12/28/21  Yes Wendie Agreste, MD  furosemide (LASIX) 20 MG tablet Take 1 tablet (20 mg total) by mouth every other day. 06/08/22  Yes Burnell Blanks, MD  HYDROcodone bit-homatropine (HYCODAN) 5-1.5 MG/5ML syrup Take 5 mLs by mouth at bedtime as needed for cough. 08/29/21  Yes Maximiano Coss, NP  Multiple Vitamin (MULTIVITAMIN) tablet Take 1 tablet by mouth daily.   Yes [provider]  mupirocin ointment (BACTROBAN) 2 % Apply topically 2 (two) times daily. 06/19/21  Yes Midge Minium, MD  nitroGLYCERIN (NITROSTAT) 0.4 MG SL tablet Place 0.4 mg under the tongue every 5 (five) minutes as needed for chest pain. Reported on 09/21/2015   Yes [provider]  rosuvastatin (CRESTOR) 40 MG tablet TAKE 1 TABLET BY MOUTH EVERY DAY 01/22/22  Yes Burnell Blanks, MD  terbinafine (LAMISIL) 250 MG tablet TAKE 1 TABLET BY MOUTH EVERY DAY 04/30/22  Yes Wendie Agreste, MD  Testosterone 30 MG/ACT SOLN APPLY 1 PUMP EVERY DAY 03/21/22  Yes Dutch Quint B, FNP  valACYclovir (VALTREX) 500 MG tablet TAKE 1 TABLET (500 MG TOTAL) BY MOUTH  DAILY. Patient taking differently: Take 500 mg by mouth as needed. 08/16/21  Yes Wendie Agreste, MD  albuterol (VENTOLIN HFA) 108 (90 Base) MCG/ACT inhaler Inhale 2 puffs into the lungs every 6 (six) hours as needed for wheezing or shortness of breath. Patient not taking: Reported on 07/09/2022 08/21/21   Brunetta Jeans, PA-C   Social History   Socioeconomic History   Marital status: Single    Spouse name: Not on file   Number of children: 2   Years of education: Not on file   Highest education level: Not on file  Occupational History   Occupation: Realtor  Tobacco Use   Smoking status: Former    Types: Cigarettes    Quit date: 08/14/1999    Years since quitting: 22.9    Passive exposure: Past   Smokeless tobacco: Never   Tobacco comments:    Social smoker  Vaping Use   Vaping Use: Never used  Substance and Sexual Activity   Alcohol use: Yes    Comment: 1oz-2oz daily    Drug use: No   Sexual activity: Yes  Other Topics Concern   Not on file  Social History Narrative   Single   Education: Secretary/administrator   Exercise: Yes   Social Determinants of Health   Financial Resource Strain: Not on file  Food Insecurity: Not on file  Transportation Needs: Not on file  Physical Activity: Not on file  Stress: Not on file  Social Connections: Not on file  Intimate Partner Violence: Not on file    Review of Systems Per HPI.   Objective:   Vitals:   07/09/22 0821  BP: 124/70  Pulse: (!) 56  Temp: 98.1 F (36.7 C)  SpO2: 98%  Weight: 181 lb 9.6 oz (82.4 kg)  Height: '5\' 7"'$  (1.702 m)     Physical Exam Vitals reviewed.  Constitutional:      Appearance: He is well-developed.  HENT:     Head: Normocephalic and atraumatic.  Right Ear: External ear normal.     Left Ear: External ear normal.  Eyes:     Conjunctiva/sclera: Conjunctivae normal.     Pupils: Pupils are equal, round, and reactive to light.  Neck:     Thyroid: No thyromegaly.     Vascular: No carotid bruit or  JVD.  Cardiovascular:     Rate and Rhythm: Normal rate and regular rhythm.     Heart sounds: Normal heart sounds. No murmur heard. Pulmonary:     Effort: Pulmonary effort is normal. No respiratory distress.     Breath sounds: Normal breath sounds. No wheezing or rales.  Abdominal:     General: There is no distension.     Palpations: Abdomen is soft.     Tenderness: There is no abdominal tenderness.  Musculoskeletal:        General: No tenderness. Normal range of motion.     Cervical back: Normal range of motion and neck supple.     Right lower leg: No edema.     Left lower leg: No edema.     Comments: Bilateral feet, slight cyanotic appearance to distal great toe, fifth toe bilaterally cap refill 2 to 3 seconds at toes.  Dysesthesia/decreased sensation in toes.  No wounds or ulceration.  Difficulty palpating DP pulses.  no appreciable edema.  Lymphadenopathy:     Cervical: No cervical adenopathy.  Skin:    General: Skin is warm and dry.     Findings: No erythema.  Neurological:     Mental Status: He is alert and oriented to person, place, and time.     Deep Tendon Reflexes: Reflexes are normal and symmetric.  Psychiatric:        Mood and Affect: Mood normal.        Behavior: Behavior normal.        Assessment & Plan:  Jeremy Hendricks is a 66 y.o. male . Annual physical exam  - -anticipatory guidance as below in AVS, screening labs above. Health maintenance items as above in HPI discussed/recommended as applicable.   Routine screening for STI (sexually transmitted infection) - Plan: emtricitabine-tenofovir (TRUVADA) 200-300 MG tablet On pre-exposure prophylaxis for HIV - Plan: emtricitabine-tenofovir (TRUVADA) 200-300 MG tablet  -Tolerating Truvada.  We will continue same.  Did not order STI screening at current visit, does have follow-up planned in 1 month.  Can discuss testing at that time.  RPR, HIV nonreactive in May.  Decreased visual acuity - Plan: Ambulatory referral  to Ophthalmology  -Some increased need for readers, Optho eval.  Elevated serum creatinine  -Maintain hydration, avoid NSAIDs, repeat testing with CMP  Hypogonadism in male - Plan: CBC with Differential/Platelet, PSA, Testosterone, Comprehensive metabolic panel, Lipid panel  -Stable symptom control with current dose testosterone, continue same, Check monitoring labs above, current dose.  HSV infection - Plan: valACYclovir (VALTREX) 500 MG tablet  -Possible flare as above, has Valtrex if needed.  Option of eval at time of new lesion for HSV swab.  Other polyneuropathy - Plan: gabapentin (NEURONTIN) 100 MG capsule, B12, TSH, CBC with Differential/Platelet Discoloration of skin of toe - Plan: Ambulatory referral to Vascular Surgery Numbness of toes - Plan: gabapentin (NEURONTIN) 100 MG capsule, B12, TSH, Ambulatory referral to Vascular Surgery  -New concern.  Did have some discoloration of toes, but may be due to cold toes during exam.  Cap refill as above less than 3 seconds.  Possible peripheral arterial disease, versus peripheral neuropathy or both.  Refer to vascular  with ER precautions for symptoms of critical limb ischemia.  Trial of gabapentin if needed, low-dose.  B12, TSH obtained without concerns.  Recheck 1 month.  RTC/ER precautions.   Meds ordered this encounter  Medications   emtricitabine-tenofovir (TRUVADA) 200-300 MG tablet    Sig: TAKE ONE TABLET BY MOUTH ONCE DAILY WITH OR WITHOUT FOOD. STORE IN ORIGINAL CONTAINER AT ROOM TEMPERATURE.    Dispense:  90 tablet    Refill:  2   valACYclovir (VALTREX) 500 MG tablet    Sig: Take 1 tablet (500 mg total) by mouth daily.    Dispense:  30 tablet    Refill:  1   gabapentin (NEURONTIN) 100 MG capsule    Sig: Take 1 capsule (100 mg total) by mouth at bedtime. As needed for neuropathy.    Dispense:  30 capsule    Refill:  1   Patient Instructions  I will refer you to eye specialist. No med changes today.  Here is info on RSV  vaccine: [email or web address] Gabapentin as option for numbness in feet, can be taken up to once per day if needed.  I will refer to vascular specialist as well to make sure this is not a circulation issue.  If you notice any continued discoloration of feet, or purple appearance does not resolve when warmed, recommend being seen right away.  Preventive Care 54 Years and Older, Male Preventive care refers to lifestyle choices and visits with your health care provider that can promote health and wellness. Preventive care visits are also called wellness exams. What can I expect for my preventive care visit? Counseling During your preventive care visit, your health care provider may ask about your: Medical history, including: Past medical problems. Family medical history. History of falls. Current health, including: Emotional well-being. Home life and relationship well-being. Sexual activity. Memory and ability to understand (cognition). Lifestyle, including: Alcohol, nicotine or tobacco, and drug use. Access to firearms. Diet, exercise, and sleep habits. Work and work Statistician. Sunscreen use. Safety issues such as seatbelt and bike helmet use. Physical exam Your health care provider will check your: Height and weight. These may be used to calculate your BMI (body mass index). BMI is a measurement that tells if you are at a healthy weight. Waist circumference. This measures the distance around your waistline. This measurement also tells if you are at a healthy weight and may help predict your risk of certain diseases, such as type 2 diabetes and high blood pressure. Heart rate and blood pressure. Body temperature. Skin for abnormal spots. What immunizations do I need?  Vaccines are usually given at various ages, according to a schedule. Your health care provider will recommend vaccines for you based on your age, medical history, and lifestyle  or other factors, such as travel or where you work. What tests do I need? Screening Your health care provider may recommend screening tests for certain conditions. This may include: Lipid and cholesterol levels. Diabetes screening. This is done by checking your blood sugar (glucose) after you have not eaten for a while (fasting). Hepatitis C test. Hepatitis B test. HIV (human immunodeficiency virus) test. STI (sexually transmitted infection) testing, if you are at risk. Lung cancer screening. Colorectal cancer screening. Prostate cancer screening. Abdominal aortic aneurysm (AAA) screening. You may need this if you are a current or former smoker. Talk with your health care provider about your test results, treatment options, and if necessary, the need for more tests. Follow these instructions at home: Eating and  drinking  Eat a diet that includes fresh fruits and vegetables, whole grains, lean protein, and low-fat dairy products. Limit your intake of foods with high amounts of sugar, saturated fats, and salt. Take vitamin and mineral supplements as recommended by your health care provider. Do not drink alcohol if your health care provider tells you not to drink. If you drink alcohol: Limit how much you have to 0-2 drinks a day. Know how much alcohol is in your drink. In the [place] one drink equals one 12 oz bottle of beer (355 mL), one 5 oz glass of wine (148 mL), or one 1 oz glass of hard liquor (44 mL). Lifestyle Brush your teeth every morning and night with fluoride toothpaste. Floss one time each day. Exercise for at least 30 minutes 5 or more days each week. Do not use any products that contain nicotine or tobacco. These products include cigarettes, chewing tobacco, and vaping devices, such as e-cigarettes. If you need help quitting, ask your health care provider. Do not use drugs. If you are sexually active, practice safe sex. Use a condom or other form of protection to prevent  STIs. Take aspirin only as told by your health care provider. Make sure that you understand how much to take and what form to take. Work with your health care provider to find out whether it is safe and beneficial for you to take aspirin daily. Ask your health care provider if you need to take a cholesterol-lowering medicine (statin). Find healthy ways to manage stress, such as: Meditation, yoga, or listening to music. Journaling. Talking to a trusted person. Spending time with friends and family. Safety Always wear your seat belt while driving or riding in a vehicle. Do not drive: If you have been drinking alcohol. Do not ride with someone who has been drinking. When you are tired or distracted. While texting. If you have been using any mind-altering substances or drugs. Wear a helmet and other protective equipment during sports activities. If you have firearms in your house, make sure you follow all gun safety procedures. Minimize exposure to UV radiation to reduce your risk of skin cancer. What's next? Visit your health care provider once a year for an annual wellness visit. Ask your health care provider how often you should have your eyes and teeth checked. Stay up to date on all vaccines. This information is not intended to replace advice given to you by your health care provider. Make sure you discuss any questions you have with your health care provider. Document Revised: 01/25/2021 Document Reviewed: 01/25/2021 Elsevier Patient Education  [place].   Peripheral Neuropathy Peripheral neuropathy is a type of nerve damage. It affects nerves that carry signals between the spinal cord and the arms, legs, and the rest of the body (peripheral nerves). It does not affect nerves in the spinal cord or brain. In peripheral neuropathy, one nerve or a group of nerves may be damaged. Peripheral neuropathy is a broad category that includes many specific nerve disorders, like diabetic  neuropathy, hereditary neuropathy, and carpal tunnel syndrome. What are the causes? This condition may be caused by: Certain diseases, such as: Diabetes. This is the most common cause of peripheral neuropathy. Autoimmune diseases, such as rheumatoid arthritis and systemic lupus erythematosus. Nerve diseases that are passed from parent to child (inherited). Kidney disease. Thyroid disease. Other causes may include: Nerve injury. Pressure or stress on a nerve that lasts a long time. Lack (deficiency) of B vitamins. This can result  from alcoholism, poor diet, or a restricted diet. Infections. Some medicines, such as cancer medicines (chemotherapy). Poisonous (toxic) substances, such as lead and mercury. Too little blood flowing to the legs. In some cases, the cause of this condition is not known. What are the signs or symptoms? Symptoms of this condition depend on which of your nerves is damaged. Symptoms in the legs, hands, and arms can include: Loss of feeling (numbness) in the feet, hands, or both. Tingling in the feet, hands, or both. Burning pain. Very sensitive skin. Weakness. Not being able to move a part of the body (paralysis). Clumsiness or poor coordination. Muscle twitching. Loss of balance. Symptoms in other parts of the body can include: Not being able to control your bladder. Feeling dizzy. Sexual problems. How is this diagnosed? Diagnosing and finding the cause of peripheral neuropathy can be difficult. Your health care provider will take your medical history and do a physical exam. A neurological exam will also be done. This involves checking things that are affected by your brain, spinal cord, and nerves (nervous system). For example, your health care provider will check your reflexes, how you move, and what you can feel. You may have other tests, such as: Blood tests. Electromyogram (EMG) and nerve conduction tests. These tests check nerve function and how well  the nerves are controlling the muscles. Imaging tests, such as a CT scan or MRI, to rule out other causes of your symptoms. Removing a small piece of nerve to be examined in a lab (nerve biopsy). Removing and examining a small amount of the fluid that surrounds the brain and spinal cord (lumbar puncture). How is this treated? Treatment for this condition may involve: Treating the underlying cause of the neuropathy, such as diabetes, kidney disease, or vitamin deficiencies. Stopping medicines that can cause neuropathy, such as chemotherapy. Medicine to help relieve pain. Medicines may include: Prescription or over-the-counter pain medicine. Anti-seizure medicine. Antidepressants. Pain-relieving patches that are applied to painful areas of skin. Surgery to relieve pressure on a nerve or to destroy a nerve that is causing pain. Physical therapy to help improve movement and balance. Devices to help you move around (assistive devices). Follow these instructions at home: Medicines Take over-the-counter and prescription medicines only as told by your health care provider. Do not take any other medicines without first asking your health care provider. Ask your health care provider if the medicine prescribed to you requires you to avoid driving or using machinery. Lifestyle  Do not use any products that contain nicotine or tobacco. These products include cigarettes, chewing tobacco, and vaping devices, such as e-cigarettes. Smoking keeps blood from reaching damaged nerves. If you need help quitting, ask your health care provider. Avoid or limit alcohol. Too much alcohol can cause a vitamin B deficiency, and vitamin B is needed for healthy nerves. Eat a healthy diet. This includes: Eating foods that are high in fiber, such as beans, whole grains, and fresh fruits and vegetables. Limiting foods that are high in fat and processed sugars, such as fried or sweet foods. General instructions  If you  have diabetes, work closely with your health care provider to keep your blood sugar under control. If you have numbness in your feet: Check every day for signs of injury or infection. Watch for redness, warmth, and swelling. Wear padded socks and comfortable shoes. These help protect your feet. Develop a good support system. Living with peripheral neuropathy can be stressful. Consider talking with a mental health specialist or  joining a support group. Use assistive devices and attend physical therapy as told by your health care provider. This may include using a walker or a cane. Keep all follow-up visits. This is important. Where to find more information [employer] of Neurological Disorders: [employer] Contact a health care provider if: You have new signs or symptoms of peripheral neuropathy. You are struggling emotionally from dealing with peripheral neuropathy. Your pain is not well controlled. Get help right away if: You have an injury or infection that is not healing normally. You develop new weakness in an arm or leg. You have fallen or do so frequently. Summary Peripheral neuropathy is when the nerves in the arms or legs are damaged, resulting in numbness, weakness, or pain. There are many causes of peripheral neuropathy, including diabetes, pinched nerves, vitamin deficiencies, autoimmune disease, and hereditary conditions. Diagnosing and finding the cause of peripheral neuropathy can be difficult. Your health care provider will take your medical history, do a physical exam, and do tests, including blood tests and nerve function tests. Treatment involves treating the underlying cause of the neuropathy and taking medicines to help control pain. Physical therapy and assistive devices may also help. This information is not intended to replace advice given to you by your health care provider. Make sure you discuss any questions you have with your health care  provider. Document Revised: 04/04/2021 Document Reviewed: 04/04/2021 Elsevier Patient Education  [place],   [name], MD [place] 07/09/22 9:30 AM

## 2022-07-09 NOTE — Patient Instructions (Addendum)
I will refer you to eye specialist. No med changes today.  Here is info on RSV vaccine: [email or web address] Gabapentin as option for numbness in feet, can be taken up to once per day if needed.  I will refer to vascular specialist as well to make sure this is not a circulation issue.  If you notice any continued discoloration of feet, or purple appearance does not resolve when warmed, recommend being seen right away.  Preventive Care 27 Years and Older, Male Preventive care refers to lifestyle choices and visits with your health care provider that can promote health and wellness. Preventive care visits are also called wellness exams. What can I expect for my preventive care visit? Counseling During your preventive care visit, your health care provider may ask about your: Medical history, including: Past medical problems. Family medical history. History of falls. Current health, including: Emotional well-being. Home life and relationship well-being. Sexual activity. Memory and ability to understand (cognition). Lifestyle, including: Alcohol, nicotine or tobacco, and drug use. Access to firearms. Diet, exercise, and sleep habits. Work and work Statistician. Sunscreen use. Safety issues such as seatbelt and bike helmet use. Physical exam Your health care provider will check your: Height and weight. These may be used to calculate your BMI (body mass index). BMI is a measurement that tells if you are at a healthy weight. Waist circumference. This measures the distance around your waistline. This measurement also tells if you are at a healthy weight and may help predict your risk of certain diseases, such as type 2 diabetes and high blood pressure. Heart rate and blood pressure. Body temperature. Skin for abnormal spots. What immunizations do I need?  Vaccines are usually given at various ages, according to a schedule. Your health care provider  will recommend vaccines for you based on your age, medical history, and lifestyle or other factors, such as travel or where you work. What tests do I need? Screening Your health care provider may recommend screening tests for certain conditions. This may include: Lipid and cholesterol levels. Diabetes screening. This is done by checking your blood sugar (glucose) after you have not eaten for a while (fasting). Hepatitis C test. Hepatitis B test. HIV (human immunodeficiency virus) test. STI (sexually transmitted infection) testing, if you are at risk. Lung cancer screening. Colorectal cancer screening. Prostate cancer screening. Abdominal aortic aneurysm (AAA) screening. You may need this if you are a current or former smoker. Talk with your health care provider about your test results, treatment options, and if necessary, the need for more tests. Follow these instructions at home: Eating and drinking  Eat a diet that includes fresh fruits and vegetables, whole grains, lean protein, and low-fat dairy products. Limit your intake of foods with high amounts of sugar, saturated fats, and salt. Take vitamin and mineral supplements as recommended by your health care provider. Do not drink alcohol if your health care provider tells you not to drink. If you drink alcohol: Limit how much you have to 0-2 drinks a day. Know how much alcohol is in your drink. In the [place] one drink equals one 12 oz bottle of beer (355 mL), one 5 oz glass of wine (148 mL), or one 1 oz glass of hard liquor (44 mL). Lifestyle Brush your teeth every morning and night with fluoride toothpaste. Floss one time each day. Exercise for at least 30 minutes 5 or more days each week. Do not use any products that contain nicotine or tobacco. These products include cigarettes, chewing  tobacco, and vaping devices, such as e-cigarettes. If you need help quitting, ask your health care provider. Do not use drugs. If you are sexually  active, practice safe sex. Use a condom or other form of protection to prevent STIs. Take aspirin only as told by your health care provider. Make sure that you understand how much to take and what form to take. Work with your health care provider to find out whether it is safe and beneficial for you to take aspirin daily. Ask your health care provider if you need to take a cholesterol-lowering medicine (statin). Find healthy ways to manage stress, such as: Meditation, yoga, or listening to music. Journaling. Talking to a trusted person. Spending time with friends and family. Safety Always wear your seat belt while driving or riding in a vehicle. Do not drive: If you have been drinking alcohol. Do not ride with someone who has been drinking. When you are tired or distracted. While texting. If you have been using any mind-altering substances or drugs. Wear a helmet and other protective equipment during sports activities. If you have firearms in your house, make sure you follow all gun safety procedures. Minimize exposure to UV radiation to reduce your risk of skin cancer. What's next? Visit your health care provider once a year for an annual wellness visit. Ask your health care provider how often you should have your eyes and teeth checked. Stay up to date on all vaccines. This information is not intended to replace advice given to you by your health care provider. Make sure you discuss any questions you have with your health care provider. Document Revised: 01/25/2021 Document Reviewed: 01/25/2021 Elsevier Patient Education  [place].   Peripheral Neuropathy Peripheral neuropathy is a type of nerve damage. It affects nerves that carry signals between the spinal cord and the arms, legs, and the rest of the body (peripheral nerves). It does not affect nerves in the spinal cord or brain. In peripheral neuropathy, one nerve or a group of nerves may be damaged. Peripheral neuropathy  is a broad category that includes many specific nerve disorders, like diabetic neuropathy, hereditary neuropathy, and carpal tunnel syndrome. What are the causes? This condition may be caused by: Certain diseases, such as: Diabetes. This is the most common cause of peripheral neuropathy. Autoimmune diseases, such as rheumatoid arthritis and systemic lupus erythematosus. Nerve diseases that are passed from parent to child (inherited). Kidney disease. Thyroid disease. Other causes may include: Nerve injury. Pressure or stress on a nerve that lasts a long time. Lack (deficiency) of B vitamins. This can result from alcoholism, poor diet, or a restricted diet. Infections. Some medicines, such as cancer medicines (chemotherapy). Poisonous (toxic) substances, such as lead and mercury. Too little blood flowing to the legs. In some cases, the cause of this condition is not known. What are the signs or symptoms? Symptoms of this condition depend on which of your nerves is damaged. Symptoms in the legs, hands, and arms can include: Loss of feeling (numbness) in the feet, hands, or both. Tingling in the feet, hands, or both. Burning pain. Very sensitive skin. Weakness. Not being able to move a part of the body (paralysis). Clumsiness or poor coordination. Muscle twitching. Loss of balance. Symptoms in other parts of the body can include: Not being able to control your bladder. Feeling dizzy. Sexual problems. How is this diagnosed? Diagnosing and finding the cause of peripheral neuropathy can be difficult. Your health care provider will take your medical history  and do a physical exam. A neurological exam will also be done. This involves checking things that are affected by your brain, spinal cord, and nerves (nervous system). For example, your health care provider will check your reflexes, how you move, and what you can feel. You may have other tests, such as: Blood tests. Electromyogram  (EMG) and nerve conduction tests. These tests check nerve function and how well the nerves are controlling the muscles. Imaging tests, such as a CT scan or MRI, to rule out other causes of your symptoms. Removing a small piece of nerve to be examined in a lab (nerve biopsy). Removing and examining a small amount of the fluid that surrounds the brain and spinal cord (lumbar puncture). How is this treated? Treatment for this condition may involve: Treating the underlying cause of the neuropathy, such as diabetes, kidney disease, or vitamin deficiencies. Stopping medicines that can cause neuropathy, such as chemotherapy. Medicine to help relieve pain. Medicines may include: Prescription or over-the-counter pain medicine. Anti-seizure medicine. Antidepressants. Pain-relieving patches that are applied to painful areas of skin. Surgery to relieve pressure on a nerve or to destroy a nerve that is causing pain. Physical therapy to help improve movement and balance. Devices to help you move around (assistive devices). Follow these instructions at home: Medicines Take over-the-counter and prescription medicines only as told by your health care provider. Do not take any other medicines without first asking your health care provider. Ask your health care provider if the medicine prescribed to you requires you to avoid driving or using machinery. Lifestyle  Do not use any products that contain nicotine or tobacco. These products include cigarettes, chewing tobacco, and vaping devices, such as e-cigarettes. Smoking keeps blood from reaching damaged nerves. If you need help quitting, ask your health care provider. Avoid or limit alcohol. Too much alcohol can cause a vitamin B deficiency, and vitamin B is needed for healthy nerves. Eat a healthy diet. This includes: Eating foods that are high in fiber, such as beans, whole grains, and fresh fruits and vegetables. Limiting foods that are high in fat and  processed sugars, such as fried or sweet foods. General instructions  If you have diabetes, work closely with your health care provider to keep your blood sugar under control. If you have numbness in your feet: Check every day for signs of injury or infection. Watch for redness, warmth, and swelling. Wear padded socks and comfortable shoes. These help protect your feet. Develop a good support system. Living with peripheral neuropathy can be stressful. Consider talking with a mental health specialist or joining a support group. Use assistive devices and attend physical therapy as told by your health care provider. This may include using a walker or a cane. Keep all follow-up visits. This is important. Where to find more information [employer] of Neurological Disorders: [email or web address] Contact a health care provider if: You have new signs or symptoms of peripheral neuropathy. You are struggling emotionally from dealing with peripheral neuropathy. Your pain is not well controlled. Get help right away if: You have an injury or infection that is not healing normally. You develop new weakness in an arm or leg. You have fallen or do so frequently. Summary Peripheral neuropathy is when the nerves in the arms or legs are damaged, resulting in numbness, weakness, or pain. There are many causes of peripheral neuropathy, including diabetes, pinched nerves, vitamin deficiencies, autoimmune disease, and hereditary conditions. Diagnosing and finding the cause of peripheral neuropathy can be difficult.  Your health care provider will take your medical history, do a physical exam, and do tests, including blood tests and nerve function tests. Treatment involves treating the underlying cause of the neuropathy and taking medicines to help control pain. Physical therapy and assistive devices may also help. This information is not intended to replace advice given to you by your health care provider. Make  sure you discuss any questions you have with your health care provider. Document Revised: 04/04/2021 Document Reviewed: 04/04/2021 Elsevier Patient Education  [place].

## 2022-07-10 ENCOUNTER — Ambulatory Visit: Payer: Medicare PPO | Attending: Rheumatology | Admitting: Rheumatology

## 2022-07-10 ENCOUNTER — Encounter: Payer: Self-pay | Admitting: Rheumatology

## 2022-07-10 ENCOUNTER — Encounter: Payer: Self-pay | Admitting: Family Medicine

## 2022-07-10 VITALS — BP 123/72 | HR 54 | Resp 17 | Ht 67.0 in | Wt 182.4 lb

## 2022-07-10 DIAGNOSIS — Z5181 Encounter for therapeutic drug level monitoring: Secondary | ICD-10-CM | POA: Diagnosis not present

## 2022-07-10 DIAGNOSIS — Z8639 Personal history of other endocrine, nutritional and metabolic disease: Secondary | ICD-10-CM

## 2022-07-10 DIAGNOSIS — M79672 Pain in left foot: Secondary | ICD-10-CM

## 2022-07-10 DIAGNOSIS — M19071 Primary osteoarthritis, right ankle and foot: Secondary | ICD-10-CM

## 2022-07-10 DIAGNOSIS — M7551 Bursitis of right shoulder: Secondary | ICD-10-CM

## 2022-07-10 DIAGNOSIS — M1A09X0 Idiopathic chronic gout, multiple sites, without tophus (tophi): Secondary | ICD-10-CM | POA: Diagnosis not present

## 2022-07-10 DIAGNOSIS — M19042 Primary osteoarthritis, left hand: Secondary | ICD-10-CM

## 2022-07-10 DIAGNOSIS — G629 Polyneuropathy, unspecified: Secondary | ICD-10-CM | POA: Diagnosis not present

## 2022-07-10 DIAGNOSIS — I25810 Atherosclerosis of coronary artery bypass graft(s) without angina pectoris: Secondary | ICD-10-CM

## 2022-07-10 DIAGNOSIS — M5136 Other intervertebral disc degeneration, lumbar region: Secondary | ICD-10-CM | POA: Diagnosis not present

## 2022-07-10 DIAGNOSIS — I5032 Chronic diastolic (congestive) heart failure: Secondary | ICD-10-CM

## 2022-07-10 DIAGNOSIS — B351 Tinea unguium: Secondary | ICD-10-CM

## 2022-07-10 DIAGNOSIS — Z8679 Personal history of other diseases of the circulatory system: Secondary | ICD-10-CM

## 2022-07-10 DIAGNOSIS — M19041 Primary osteoarthritis, right hand: Secondary | ICD-10-CM

## 2022-07-10 DIAGNOSIS — M19072 Primary osteoarthritis, left ankle and foot: Secondary | ICD-10-CM

## 2022-07-10 DIAGNOSIS — G4709 Other insomnia: Secondary | ICD-10-CM

## 2022-07-10 DIAGNOSIS — N182 Chronic kidney disease, stage 2 (mild): Secondary | ICD-10-CM

## 2022-07-10 DIAGNOSIS — M25551 Pain in right hip: Secondary | ICD-10-CM

## 2022-07-10 DIAGNOSIS — M51369 Other intervertebral disc degeneration, lumbar region without mention of lumbar back pain or lower extremity pain: Secondary | ICD-10-CM

## 2022-07-11 LAB — URIC ACID: Uric Acid, Serum: 5.9 mg/dL (ref 4.0–8.0)

## 2022-07-11 NOTE — Progress Notes (Signed)
Uric acid is 5.9.  Goal is to keep uric acid below 6.0.

## 2022-07-23 ENCOUNTER — Other Ambulatory Visit: Payer: Self-pay | Admitting: *Deleted

## 2022-07-23 DIAGNOSIS — M79673 Pain in unspecified foot: Secondary | ICD-10-CM

## 2022-07-30 ENCOUNTER — Encounter: Payer: Self-pay | Admitting: Family Medicine

## 2022-07-30 ENCOUNTER — Ambulatory Visit (INDEPENDENT_AMBULATORY_CARE_PROVIDER_SITE_OTHER): Payer: Medicare PPO | Admitting: Family Medicine

## 2022-07-30 VITALS — BP 118/60 | HR 60 | Temp 98.2°F | Ht 67.0 in | Wt 183.6 lb

## 2022-07-30 DIAGNOSIS — K4090 Unilateral inguinal hernia, without obstruction or gangrene, not specified as recurrent: Secondary | ICD-10-CM | POA: Diagnosis not present

## 2022-07-30 DIAGNOSIS — R1031 Right lower quadrant pain: Secondary | ICD-10-CM

## 2022-07-30 NOTE — Patient Instructions (Addendum)
I will order ultrasound, but am suspicious for hernia. See info below. I will order ultrasound then likely surgery referral but will let you know. Could also have a slight groin strain. Avoid any specific exercises that cause pain in that area for now. Return to the clinic or go to the nearest emergency room if any of your symptoms worsen or new symptoms occur.  Hernia, Adult     A hernia is the bulging of an organ or tissue through a weak spot in the muscles of the abdomen. Hernias develop most often near the belly button (navel) or the area where the leg meets the lower abdomen (groin). Common types of hernias include: Incisional hernia. This type bulges through a scar from an abdominal surgery. Umbilical hernia. This type develops near the navel. Inguinal hernia. This type develops in the groin or scrotum. Femoral hernia. This type develops below the groin, in the upper thigh area. Hiatal hernia. This type occurs when part of the stomach slides above the muscle that separates the abdomen from the chest (diaphragm). What are the causes? This condition may be caused by: Heavy lifting. Coughing over a long period of time. Straining to have a bowel movement. Constipation can lead to straining. An incision made during abdominal surgery. A physical problem that is present at birth (congenital defect). Being overweight or obese. Smoking. Excess fluid in the abdomen. Undescended testicles in males. What are the signs or symptoms? The main symptom is a skin-colored, rounded bulge in the area of the hernia. However, a bulge may not always be present. It may grow bigger or be more visible when you cough or strain (such as when lifting something heavy). A hernia that can be pushed back into the abdomen (is reducible) rarely causes pain. A hernia that cannot be pushed back into the abdomen (is incarcerated) may lose its blood supply (become strangulated). A hernia that is incarcerated may  cause: Pain. Fever. Nausea and vomiting. Swelling. Constipation. How is this diagnosed? A hernia may be diagnosed based on: Your symptoms and medical history. A physical exam. Your health care provider may ask you to cough or move in certain ways to see if the hernia becomes visible. Imaging tests, such as: X-rays. Ultrasound. CT scan. How is this treated? A hernia that is small and painless may not need to be treated. A hernia that is large or painful may be treated with surgery. Inguinal hernias may be treated with surgery to prevent incarceration or strangulation. Strangulated hernias are always treated with surgery because the strangulation causes a lack of blood supply to the trapped organ or tissue. Surgery to treat a hernia involves pushing the bulge back into place and repairing the weak area of the muscle or abdominal wall. Follow these instructions at home: Activity Avoid straining. Do not lift anything that is heavier than 10 lb (4.5 kg), or the limit that you are told, until your health care provider says that it is safe. When lifting heavy objects, lift with your leg muscles, not your back muscles. Preventing constipation Take actions to prevent constipation. Constipation leads to straining with bowel movements, which can make a hernia worse or cause a hernia repair to break down. Your health care provider may recommend that you take these actions to prevent or treat constipation: Drink enough fluid to keep your urine pale yellow. Take over-the-counter or prescription medicines. Eat foods that are high in fiber, such as beans, whole grains, and fresh fruits and vegetables. Limit foods that  are high in fat and processed sugars, such as fried or sweet foods. General instructions When coughing, try to cough gently. You may try to push the hernia back in place by very gently pressing on it while lying down. Do not try to force the bulge back in if it will not push in  easily. If you are overweight, work with your health care provider to lose weight safely. Do not use any products that contain nicotine or tobacco. These products include cigarettes, chewing tobacco, and vaping devices, such as e-cigarettes. If you need help quitting, ask your health care provider. If you are scheduled for hernia repair, watch your hernia for any changes in shape, size, or color. Tell your health care provider about any changes or new symptoms. Take over-the-counter and prescription medicines only as told by your health care provider. Keep all follow-up visits. This is important. Contact a health care provider if: You develop new pain, swelling, or redness around your hernia. You have signs of constipation, such as: Fewer bowel movements in a week than normal. Difficulty having a bowel movement. Stools that are dry, hard, or larger than normal. Get help right away if: You have a fever or chills. You have abdominal pain that gets worse. You feel nauseous or you vomit. You cannot push the hernia back in place by very gently pressing on it while lying down. Do not try to force the bulge back in if it will not go in easily. The hernia: Changes in shape, size, or color. Feels hard or tender. These symptoms may represent a serious problem that is an emergency. Do not wait to see if the symptoms will go away. Get medical help right away. Call your local emergency services (911 in the [place] Do not drive yourself to the hospital. Summary A hernia is the bulging of an organ or tissue through a weak spot in the muscles of the abdomen. The main symptom is a skin-colored bulge in the hernia area. However, a bulge may not always be present. It may grow bigger or more visible when you cough or strain (such as when having a bowel movement). A hernia that is small and painless may not need to be treated. A hernia that is large or painful may be treated with surgery. Surgery to treat a  hernia involves pushing the bulge back into place and repairing the weak part of the abdomen. This information is not intended to replace advice given to you by your health care provider. Make sure you discuss any questions you have with your health care provider. Document Revised: 03/07/2020 Document Reviewed: 03/07/2020 Elsevier Patient Education  [place].

## 2022-07-30 NOTE — Progress Notes (Signed)
Subjective:  Patient ID: Jeremy Hendricks, male    DOB: Jan 30, 1956  Age: 66 y.o. MRN: [id number]  CC:  Chief Complaint  Patient presents with   Groin Pain    Pt states he has pain in the right side of the groin area, he states its been there for 3 weeks , pt states it hurts running and intercourse     HPI Jeremy Hendricks presents for   R groin pain: Discomfort in groin for past 3-4 weeks. No known specific time of onset, no pop. Noticed with running initially, still sore to run or with intercourse. Laying back or certain stretches cause soreness.  No pain with weight bearing. Jarring with run.  No recent change in activity/running mileage prior. Backed off running since. Ran 5k on Thanksgiving - sore during run.  2/10 discomfort. Lymph node infections in groin as child. Possible lump in area.  No treatments.  No fever, bowel changes or urinary symptoms. No testicle or penile pain or discharge. No rash or skin changes.    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 s/p CABG 2002. b. s/p redo 2012.   DJD (degenerative joint disease)    Gout    History of ETT    a. ETT 6/16:  normal   Past Surgical  History:  Procedure Laterality Date   CORONARY ARTERY BYPASS GRAFT  2002   CORONARY ARTERY BYPASS GRAFT  08-2010   L-LAD remained from original CABG; new grafts incl L radial- PDA + RIMA-RI   VASECTOMY     No Known Allergies Prior to Admission medications   Medication Sig Start Date End Date Taking? Authorizing Provider  albuterol (VENTOLIN HFA) 108 (90 Base) MCG/ACT inhaler Inhale 2 puffs into the lungs every 6 (six) hours as needed for wheezing or shortness of breath. 08/21/21  Yes Brunetta Jeans, PA-C  allopurinol (ZYLOPRIM) 300 MG tablet TAKE 1 TABLET BY MOUTH EVERY DAY 06/08/22  Yes Deveshwar, Abel Presto, MD  atenolol (TENORMIN) 25 MG tablet TAKE 1 TABLET BY MOUTH EVERY DAY 03/06/22  Yes Burnell Blanks, MD  Colchicine 0.6 MG CAPS TAKE 1 TABLETS ON FIRST DAY OF FLARE UP. THEN TAKE 1 DAILY UNTIL FLARE UP RESOLVED 11/01/21  Yes Deveshwar, Abel Presto, MD  emtricitabine-tenofovir (TRUVADA) 200-300 MG tablet TAKE ONE TABLET BY MOUTH ONCE DAILY WITH OR WITHOUT FOOD. STORE IN ORIGINAL CONTAINER AT ROOM TEMPERATURE. 07/09/22  Yes Wendie Agreste, MD  furosemide (LASIX) 20 MG tablet Take 1 tablet (20 mg total) by mouth every other day. 06/08/22  Yes Burnell Blanks, MD  gabapentin (NEURONTIN) 100 MG capsule Take 1 capsule (100  mg total) by mouth at bedtime. As needed for neuropathy. 07/09/22  Yes Wendie Agreste, MD  HYDROcodone bit-homatropine (HYCODAN) 5-1.5 MG/5ML syrup Take 5 mLs by mouth at bedtime as needed for cough. 08/29/21  Yes Maximiano Coss, NP  Multiple Vitamin (MULTIVITAMIN) tablet Take 1 tablet by mouth daily.   Yes [provider]  mupirocin ointment (BACTROBAN) 2 % Apply topically 2 (two) times daily. 06/19/21  Yes Midge Minium, MD  nitroGLYCERIN (NITROSTAT) 0.4 MG SL tablet Place 0.4 mg under the tongue every 5 (five) minutes as needed for chest pain. Reported on 09/21/2015   Yes [provider]  rosuvastatin (CRESTOR) 40 MG tablet TAKE 1 TABLET BY  MOUTH EVERY DAY 01/22/22  Yes Burnell Blanks, MD  terbinafine (LAMISIL) 250 MG tablet TAKE 1 TABLET BY MOUTH EVERY DAY 04/30/22  Yes Wendie Agreste, MD  Testosterone 30 MG/ACT SOLN APPLY 1 PUMP EVERY DAY 03/21/22  Yes Dutch Quint B, FNP  valACYclovir (VALTREX) 500 MG tablet Take 1 tablet (500 mg total) by mouth daily. 07/09/22  Yes Wendie Agreste, MD   Social History   Socioeconomic History   Marital status: Single    Spouse name: Not on file   Number of children: 2   Years of education: Not on file   Highest education level: Not on file  Occupational History   Occupation: Realtor  Tobacco Use   Smoking status: Former    Types: Cigarettes    Quit date: 08/14/1999    Years since quitting: 22.9    Passive exposure: Past   Smokeless tobacco: Never   Tobacco comments:    Social smoker  Vaping Use   Vaping Use: Never used  Substance and Sexual Activity   Alcohol use: Yes    Comment: 1oz-2oz daily    Drug use: No   Sexual activity: Yes  Other Topics Concern   Not on file  Social History Narrative   Single   Education: Secretary/administrator   Exercise: Yes   Social Determinants of Health   Financial Resource Strain: Not on file  Food Insecurity: Not on file  Transportation Needs: Not on file  Physical Activity: Not on file  Stress: Not on file  Social Connections: Not on file  Intimate Partner Violence: Not on file    Review of Systems Per HPI.   Objective:   Vitals:   07/30/22 1445  BP: 118/60  Pulse: 60  Temp: 98.2 F (36.8 C)  SpO2: 96%  Weight: 183 lb 9.6 oz (83.3 kg)  Height: '5\' 7"'$  (1.702 m)     Physical Exam Constitutional:      General: He is not in acute distress.    Appearance: Normal appearance. He is well-developed.  HENT:     Head: Normocephalic and atraumatic.  Cardiovascular:     Rate and Rhythm: Normal rate.  Pulmonary:     Effort: Pulmonary effort is normal.  Abdominal:     Hernia: A hernia is present. Hernia is present in the right  inguinal area (Reproducible area of discomfort, small bulge just medial to the right inguinal canal.  Noted with Valsalva, abdominal contraction, reducible.  No skin changes or erythema.). There is no hernia in the left inguinal area.  Musculoskeletal:     Comments: Pain-free hip range of motion, minimal discomfort into the groin with resisted adduction of the right hip, but only slight.  Equal strength, intact range of motion.  Negative seated straight leg raise.  Neurological:  Mental Status: He is alert and oriented to person, place, and time.  Psychiatric:        Mood and Affect: Mood normal.        Assessment & Plan:  Jeremy Hendricks is a 66 y.o. male . Groin pain, right - Plan: US Pelvis Limited  Unilateral inguinal hernia without obstruction or gangrene, recurrence not specified - Plan: US Pelvis Limited  Appears to have inguinal hernia, check ultrasound to clarify and refer to surgery if present to discuss options.  Potentially could have slight groin strain, activity modification.  Ultrasound pending.  RTC precautions, hernia ER precautions given.  No orders of the defined types were placed in this encounter.  Patient Instructions  I will order ultrasound, but am suspicious for hernia. See info below. I will order ultrasound then likely surgery referral but will let you know. Could also have a slight groin strain. Avoid any specific exercises that cause pain in that area for now. Return to the clinic or go to the nearest emergency room if any of your symptoms worsen or new symptoms occur.  Hernia, Adult     A hernia is the bulging of an organ or tissue through a weak spot in the muscles of the abdomen. Hernias develop most often near the belly button (navel) or the area where the leg meets the lower abdomen (groin). Common types of hernias include: Incisional hernia. This type bulges through a scar from an abdominal surgery. Umbilical hernia. This type develops near the  navel. Inguinal hernia. This type develops in the groin or scrotum. Femoral hernia. This type develops below the groin, in the upper thigh area. Hiatal hernia. This type occurs when part of the stomach slides above the muscle that separates the abdomen from the chest (diaphragm). What are the causes? This condition may be caused by: Heavy lifting. Coughing over a long period of time. Straining to have a bowel movement. Constipation can lead to straining. An incision made during abdominal surgery. A physical problem that is present at birth (congenital defect). Being overweight or obese. Smoking. Excess fluid in the abdomen. Undescended testicles in males. What are the signs or symptoms? The main symptom is a skin-colored, rounded bulge in the area of the hernia. However, a bulge may not always be present. It may grow bigger or be more visible when you cough or strain (such as when lifting something heavy). A hernia that can be pushed back into the abdomen (is reducible) rarely causes pain. A hernia that cannot be pushed back into the abdomen (is incarcerated) may lose its blood supply (become strangulated). A hernia that is incarcerated may cause: Pain. Fever. Nausea and vomiting. Swelling. Constipation. How is this diagnosed? A hernia may be diagnosed based on: Your symptoms and medical history. A physical exam. Your health care provider may ask you to cough or move in certain ways to see if the hernia becomes visible. Imaging tests, such as: X-rays. Ultrasound. CT scan. How is this treated? A hernia that is small and painless may not need to be treated. A hernia that is large or painful may be treated with surgery. Inguinal hernias may be treated with surgery to prevent incarceration or strangulation. Strangulated hernias are always treated with surgery because the strangulation causes a lack of blood supply to the trapped organ or tissue. Surgery to treat a hernia involves pushing  the bulge back into place and repairing the weak area of the muscle or abdominal wall. Follow these instructions  at home: Activity Avoid straining. Do not lift anything that is heavier than 10 lb (4.5 kg), or the limit that you are told, until your health care provider says that it is safe. When lifting heavy objects, lift with your leg muscles, not your back muscles. Preventing constipation Take actions to prevent constipation. Constipation leads to straining with bowel movements, which can make a hernia worse or cause a hernia repair to break down. Your health care provider may recommend that you take these actions to prevent or treat constipation: Drink enough fluid to keep your urine pale yellow. Take over-the-counter or prescription medicines. Eat foods that are high in fiber, such as beans, whole grains, and fresh fruits and vegetables. Limit foods that are high in fat and processed sugars, such as fried or sweet foods. General instructions When coughing, try to cough gently. You may try to push the hernia back in place by very gently pressing on it while lying down. Do not try to force the bulge back in if it will not push in easily. If you are overweight, work with your health care provider to lose weight safely. Do not use any products that contain nicotine or tobacco. These products include cigarettes, chewing tobacco, and vaping devices, such as e-cigarettes. If you need help quitting, ask your health care provider. If you are scheduled for hernia repair, watch your hernia for any changes in shape, size, or color. Tell your health care provider about any changes or new symptoms. Take over-the-counter and prescription medicines only as told by your health care provider. Keep all follow-up visits. This is important. Contact a health care provider if: You develop new pain, swelling, or redness around your hernia. You have signs of constipation, such as: Fewer bowel movements in a week  than normal. Difficulty having a bowel movement. Stools that are dry, hard, or larger than normal. Get help right away if: You have a fever or chills. You have abdominal pain that gets worse. You feel nauseous or you vomit. You cannot push the hernia back in place by very gently pressing on it while lying down. Do not try to force the bulge back in if it will not go in easily. The hernia: Changes in shape, size, or color. Feels hard or tender. These symptoms may represent a serious problem that is an emergency. Do not wait to see if the symptoms will go away. Get medical help right away. Call your local emergency services (911 in the [place] Do not drive yourself to the hospital. Summary A hernia is the bulging of an organ or tissue through a weak spot in the muscles of the abdomen. The main symptom is a skin-colored bulge in the hernia area. However, a bulge may not always be present. It may grow bigger or more visible when you cough or strain (such as when having a bowel movement). A hernia that is small and painless may not need to be treated. A hernia that is large or painful may be treated with surgery. Surgery to treat a hernia involves pushing the bulge back into place and repairing the weak part of the abdomen. This information is not intended to replace advice given to you by your health care provider. Make sure you discuss any questions you have with your health care provider. Document Revised: 03/07/2020 Document Reviewed: 03/07/2020 Elsevier Patient Education  [place],   [name], MD [facility] 07/30/22 3:32 PM

## 2022-07-31 ENCOUNTER — Encounter: Payer: Self-pay | Admitting: Vascular Surgery

## 2022-07-31 ENCOUNTER — Ambulatory Visit: Payer: Medicare PPO | Admitting: Vascular Surgery

## 2022-07-31 ENCOUNTER — Ambulatory Visit (HOSPITAL_COMMUNITY)
Admission: RE | Admit: 2022-07-31 | Discharge: 2022-07-31 | Disposition: A | Discharge status: Home / Self Care | Payer: Medicare PPO | Source: Ambulatory Visit | Attending: Vascular Surgery | Admitting: Vascular Surgery

## 2022-07-31 VITALS — BP 111/70 | HR 59 | Temp 97.5°F | Resp 16 | Ht 66.5 in | Wt 181.0 lb

## 2022-07-31 DIAGNOSIS — M79673 Pain in unspecified foot: Secondary | ICD-10-CM | POA: Diagnosis not present

## 2022-07-31 DIAGNOSIS — R200 Anesthesia of skin: Secondary | ICD-10-CM | POA: Diagnosis not present

## 2022-07-31 NOTE — Progress Notes (Signed)
Patient name: Jeremy Hendricks MRN: [id number] DOB: [birth year] Sex: male  REASON FOR CONSULT: Evaluate for PAD  HPI: Jeremy Hendricks is a 66 y.o. male, with history of coronary artery disease status post CABG as well as stage II CKD that presents for evaluation of PAD.  Patient states that he has had some numbness in all 5 digits of both feet for months.  His primary care doctor wanted to make sure he did not have any evidence of arterial disease.  He has had a left great saphenous vein harvest for CABG in the past.  No significant pain in his leg when he walks or ambulates.  Past Medical History:  Diagnosis Date   Atrial fibrillation ([place])    postoperative   Chronic diastolic CHF (congestive heart failure) ([place])    CKD (chronic kidney disease), stage II    Coronary artery disease    a. s/p CABG 2002. b. s/p redo 2012.   DJD (degenerative joint disease)    Gout    History of ETT    a. ETT 6/16:  normal    Past Surgical History:  Procedure Laterality Date   CORONARY ARTERY BYPASS GRAFT  2002   CORONARY ARTERY BYPASS GRAFT  08-2010   L-LAD remained from original CABG; new grafts incl L radial- PDA + RIMA-RI   VASECTOMY      Family History  Problem Relation Age of Onset   Heart attack Father 28   Hypertension Father    Cancer Mother        Breast cancer   Cancer Other        family hx of   Coronary artery disease Other        family hx of   Hyperlipidemia Other        family hx of   Hypertension Paternal Grandfather    Healthy Son    Healthy Daughter    Stroke Neg Hx    Colon cancer Neg Hx     SOCIAL HISTORY: Social History   Socioeconomic History   Marital status: Single    Spouse name: Not on file   Number of children: 2   Years of education: Not on file   Highest education level: Not on file  Occupational History   Occupation: Realtor  Tobacco Use   Smoking status: Former    Types: Cigarettes    Quit date: 08/14/1999    Years since quitting: 22.9     Passive exposure: Past   Smokeless tobacco: Never   Tobacco comments:    Social smoker  Vaping Use   Vaping Use: Never used  Substance and Sexual Activity   Alcohol use: Yes    Comment: 1oz-2oz daily    Drug use: No   Sexual activity: Yes  Other Topics Concern   Not on file  Social History Narrative   Single   Education: Secretary/administrator   Exercise: Yes   Social Determinants of Health   Financial Resource Strain: Not on file  Food Insecurity: Not on file  Transportation Needs: Not on file  Physical Activity: Not on file  Stress: Not on file  Social Connections: Not on file  Intimate Partner Violence: Not on file    No Known Allergies  Current Outpatient Medications  Medication Sig Dispense Refill   albuterol (VENTOLIN HFA) 108 (90 Base) MCG/ACT inhaler Inhale 2 puffs into the lungs every 6 (six) hours as needed for wheezing or shortness of breath. 8 g 0  allopurinol (ZYLOPRIM) 300 MG tablet TAKE 1 TABLET BY MOUTH EVERY DAY 90 tablet 0   atenolol (TENORMIN) 25 MG tablet TAKE 1 TABLET BY MOUTH EVERY DAY 30 tablet 9   Colchicine 0.6 MG CAPS TAKE 1 TABLETS ON FIRST DAY OF FLARE UP. THEN TAKE 1 DAILY UNTIL FLARE UP RESOLVED 8 capsule 2   emtricitabine-tenofovir (TRUVADA) 200-300 MG tablet TAKE ONE TABLET BY MOUTH ONCE DAILY WITH OR WITHOUT FOOD. STORE IN ORIGINAL CONTAINER AT ROOM TEMPERATURE. 90 tablet 2   furosemide (LASIX) 20 MG tablet Take 1 tablet (20 mg total) by mouth every other day. 30 tablet 3   gabapentin (NEURONTIN) 100 MG capsule Take 1 capsule (100 mg total) by mouth at bedtime. As needed for neuropathy. 30 capsule 1   HYDROcodone bit-homatropine (HYCODAN) 5-1.5 MG/5ML syrup Take 5 mLs by mouth at bedtime as needed for cough. 120 mL 0   Multiple Vitamin (MULTIVITAMIN) tablet Take 1 tablet by mouth daily.     mupirocin ointment (BACTROBAN) 2 % Apply topically 2 (two) times daily. 30 g 0   nitroGLYCERIN (NITROSTAT) 0.4 MG SL tablet Place 0.4 mg under the tongue every 5  (five) minutes as needed for chest pain. Reported on 09/21/2015     rosuvastatin (CRESTOR) 40 MG tablet TAKE 1 TABLET BY MOUTH EVERY DAY 90 tablet 3   terbinafine (LAMISIL) 250 MG tablet TAKE 1 TABLET BY MOUTH EVERY DAY 90 tablet 0   Testosterone 30 MG/ACT SOLN APPLY 1 PUMP EVERY DAY 90 mL 5   valACYclovir (VALTREX) 500 MG tablet Take 1 tablet (500 mg total) by mouth daily. 30 tablet 1   No current facility-administered medications for this visit.    REVIEW OF SYSTEMS:  '[X]'$  denotes positive finding, '[ ]'$  denotes negative finding Cardiac  Comments:  Chest pain or chest pressure:    Shortness of breath upon exertion:    Short of breath when lying flat:    Irregular heart rhythm:        Vascular    Pain in calf, thigh, or hip brought on by ambulation:    Pain in feet at night that wakes you up from your sleep:     Blood clot in your veins:    Leg swelling:         Pulmonary    Oxygen at home:    Productive cough:     Wheezing:         Neurologic    Sudden weakness in arms or legs:     Sudden numbness in arms or legs:     Sudden onset of difficulty speaking or slurred speech:    Temporary loss of vision in one eye:     Problems with dizziness:         Gastrointestinal    Blood in stool:     Vomited blood:         Genitourinary    Burning when urinating:     Blood in urine:        Psychiatric    Major depression:         Hematologic    Bleeding problems:    Problems with blood clotting too easily:        Skin    Rashes or ulcers:        Constitutional    Fever or chills:      PHYSICAL EXAM: Vitals:   07/31/22 1021  BP: 111/70  Pulse: (!) 59  Resp: 16  Temp: (!)  97.5 F (36.4 C)  TempSrc: Temporal  SpO2: 96%  Weight: 181 lb (82.1 kg)  Height: 5' 6.5" (1.689 m)    GENERAL: The patient is a well-nourished male, in no acute distress. The vital signs are documented above. CARDIAC: There is a regular rate and rhythm.  VASCULAR:  Palpable femoral pulses  bilaterally Palpable DP pulses bilaterally PULMONARY: No respiratory distress ABDOMEN: Soft and non-tender. MUSCULOSKELETAL: There are no major deformities or cyanosis. NEUROLOGIC: No focal weakness or paresthesias are detected. SKIN: There are no ulcers or rashes noted. PSYCHIATRIC: The patient has a normal affect.  DATA:   ABI's 1.01 right multiphasic and 0.99 left multiphasic  - no evidence of significant arterial disease   Assessment/Plan:  66 year old male presents for evaluation of PAD in the setting of tingling and numbness in both feet.  Discussed on exam he has palpable pedal pulses.  His ABIs today are normal with no signs of significant arterial disease.  I do not think he has any significant arterial insufficiency to explain his symptoms.  He just recently ran in the [place] [employer] 5K in [place] and endorses no lower extremity claudication.  He can follow-up with me as needed in the future.  I suspect his toe numbness is related to neuropathy or some other etiology.   Marty Heck, MD [facility] Office: [phone]

## 2022-08-01 ENCOUNTER — Ambulatory Visit: Payer: Medicare PPO | Admitting: Family Medicine

## 2022-08-08 DIAGNOSIS — H0102B Squamous blepharitis left eye, upper and lower eyelids: Secondary | ICD-10-CM | POA: Diagnosis not present

## 2022-08-08 DIAGNOSIS — H0288A Meibomian gland dysfunction right eye, upper and lower eyelids: Secondary | ICD-10-CM | POA: Diagnosis not present

## 2022-08-08 DIAGNOSIS — H25813 Combined forms of age-related cataract, bilateral: Secondary | ICD-10-CM | POA: Diagnosis not present

## 2022-08-08 DIAGNOSIS — H0288B Meibomian gland dysfunction left eye, upper and lower eyelids: Secondary | ICD-10-CM | POA: Diagnosis not present

## 2022-08-08 DIAGNOSIS — H527 Unspecified disorder of refraction: Secondary | ICD-10-CM | POA: Diagnosis not present

## 2022-08-08 DIAGNOSIS — H0102A Squamous blepharitis right eye, upper and lower eyelids: Secondary | ICD-10-CM | POA: Diagnosis not present

## 2022-08-09 ENCOUNTER — Encounter: Payer: Self-pay | Admitting: Family Medicine

## 2022-08-10 ENCOUNTER — Ambulatory Visit
Admission: RE | Admit: 2022-08-10 | Discharge: 2022-08-10 | Disposition: A | Discharge status: Home / Self Care | Payer: Medicare PPO | Source: Ambulatory Visit | Attending: Family Medicine | Admitting: Family Medicine

## 2022-08-10 DIAGNOSIS — K4090 Unilateral inguinal hernia, without obstruction or gangrene, not specified as recurrent: Secondary | ICD-10-CM

## 2022-08-10 DIAGNOSIS — R1030 Lower abdominal pain, unspecified: Secondary | ICD-10-CM | POA: Diagnosis not present

## 2022-08-10 DIAGNOSIS — R1031 Right lower quadrant pain: Secondary | ICD-10-CM

## 2022-08-14 ENCOUNTER — Other Ambulatory Visit: Payer: Self-pay | Admitting: Family Medicine

## 2022-08-14 ENCOUNTER — Encounter: Payer: Self-pay | Admitting: Family Medicine

## 2022-08-14 DIAGNOSIS — K4090 Unilateral inguinal hernia, without obstruction or gangrene, not specified as recurrent: Secondary | ICD-10-CM

## 2022-08-14 NOTE — Progress Notes (Signed)
See ultrasound results, referred to general surgery as discussed for right inguinal hernia.

## 2022-08-14 NOTE — Telephone Encounter (Signed)
Should we hold off on the referral or sent it to [facility]?

## 2022-08-16 ENCOUNTER — Ambulatory Visit: Payer: Medicare PPO | Admitting: Family Medicine

## 2022-08-27 ENCOUNTER — Encounter: Payer: Self-pay | Admitting: Cardiovascular Disease

## 2022-08-27 DIAGNOSIS — I5032 Chronic diastolic (congestive) heart failure: Secondary | ICD-10-CM

## 2022-08-27 NOTE — Telephone Encounter (Signed)
Lasix was filled for 30 days/3 refills to be taken every other day. It sounds like from his message he had been taking more often.that is likely why he can't get it refilled yet.    In Nov, per PCP labs creatinine was elevated.  He has not had a recheck on this.  Will route to Dr. Angelena Form to make aware and for further recommendations.

## 2022-08-29 ENCOUNTER — Other Ambulatory Visit: Payer: Self-pay | Admitting: Rheumatology

## 2022-08-29 ENCOUNTER — Other Ambulatory Visit: Payer: Self-pay | Admitting: Family Medicine

## 2022-08-29 DIAGNOSIS — Z113 Encounter for screening for infections with a predominantly sexual mode of transmission: Secondary | ICD-10-CM

## 2022-08-29 DIAGNOSIS — B351 Tinea unguium: Secondary | ICD-10-CM

## 2022-08-29 NOTE — Telephone Encounter (Signed)
Next Visit: Due May 2024. Message sent to the front to schedule.   Last Visit: 07/10/2022  Last Fill: 06/08/2022  DX: Idiopathic chronic gout of multiple sites without tophus    Current Dose per office note 07/10/2022: allopurinol 300 mg p.o. daily   Labs: 07/09/2022 Glucose 100, BUN 24, Creat. 1.61, GFR 44.57, Monocytes Relative 18.9, Monocytes Absolute 1.5  Okay to refill Allopurinol?

## 2022-08-29 NOTE — Telephone Encounter (Signed)
Please schedule patient a follow up visit. Patient due May 2024. Thanks!  

## 2022-08-30 NOTE — Telephone Encounter (Signed)
LMOM for patient to call and schedule follow-up appointment.   °

## 2022-08-31 ENCOUNTER — Ambulatory Visit: Payer: Medicare PPO | Attending: Cardiovascular Disease

## 2022-08-31 DIAGNOSIS — I5032 Chronic diastolic (congestive) heart failure: Secondary | ICD-10-CM | POA: Diagnosis not present

## 2022-08-31 DIAGNOSIS — K4020 Bilateral inguinal hernia, without obstruction or gangrene, not specified as recurrent: Secondary | ICD-10-CM | POA: Diagnosis not present

## 2022-08-31 LAB — BASIC METABOLIC PANEL
BUN/Creatinine Ratio: 14 (ref 10–24)
BUN: 20 mg/dL (ref 8–27)
CO2: 24 mmol/L (ref 20–29)
Calcium: 9.5 mg/dL (ref 8.6–10.2)
Chloride: 101 mmol/L (ref 96–106)
Creatinine, Ser: 1.43 mg/dL — ABNORMAL HIGH (ref 0.76–1.27)
Glucose: 85 mg/dL (ref 70–99)
Potassium: 4.4 mmol/L (ref 3.5–5.2)
Sodium: 140 mmol/L (ref 134–144)
eGFR: 54 mL/min/{1.73_m2} — ABNORMAL LOW (ref >59)

## 2022-08-31 MED ORDER — FUROSEMIDE 20 MG PO TABS: 20.0000 mg | ORAL_TABLET | ORAL | 1 refills | Status: DC

## 2022-08-31 NOTE — Telephone Encounter (Signed)
From Dr. Angelena Form: Can we find out how often he has been taking the Lasix and then we can refill but he will need a BMET also to make sure his renal function is stable. Thanks, chris    I called the patient.  He had been taking the lasix daily until he realized it was supposed to be every other day.   He ran out early because of how he had been previously been taking.  He will come in today to get labs.  I sent his refill to [facility].  He needs follow up in April 2024.

## 2022-11-08 ENCOUNTER — Telehealth: Payer: Self-pay | Admitting: Family Medicine

## 2022-11-08 NOTE — Telephone Encounter (Signed)
Called patient to schedule Medicare Annual Wellness Visit (AWV). Left message for patient to call back and schedule Medicare Annual Wellness Visit (AWV).  Last date of AWV: AWVI eligible as of 11/12/2022  Please schedule an AWVI appointment at any time with [facility].  If any questions, please contact me at [phone].   Thank you,  [place] Direct dial  [phone]

## 2022-11-22 ENCOUNTER — Other Ambulatory Visit: Payer: Self-pay | Admitting: Family Medicine

## 2022-11-22 ENCOUNTER — Encounter: Payer: Self-pay | Admitting: Family Medicine

## 2022-11-22 ENCOUNTER — Ambulatory Visit (INDEPENDENT_AMBULATORY_CARE_PROVIDER_SITE_OTHER): Payer: Medicare PPO | Admitting: *Deleted

## 2022-11-22 DIAGNOSIS — Z113 Encounter for screening for infections with a predominantly sexual mode of transmission: Secondary | ICD-10-CM

## 2022-11-22 DIAGNOSIS — Z0000 Encounter for general adult medical examination without abnormal findings: Secondary | ICD-10-CM

## 2022-11-22 DIAGNOSIS — B351 Tinea unguium: Secondary | ICD-10-CM

## 2022-11-22 NOTE — Patient Instructions (Signed)
[name] , Thank you for taking time to come for your Medicare Wellness Visit. I appreciate your ongoing commitment to your health goals. Please review the following plan we discussed and let me know if I can assist you in the future.   Screening recommendations/referrals: Colonoscopy: up to date Recommended yearly ophthalmology/optometry visit for glaucoma screening and checkup Recommended yearly dental visit for hygiene and checkup  Vaccinations: Influenza vaccine: up to date Pneumococcal vaccine: up to date Tdap vaccine: up to date Shingles vaccine: up to date    Advanced directives: Education provided      Preventive Care 65 Years and Older, Male Preventive care refers to lifestyle choices and visits with your health care provider that can promote health and wellness. What does preventive care include? A yearly physical exam. This is also called an annual well check. Dental exams once or twice a year. Routine eye exams. Ask your health care provider how often you should have your eyes checked. Personal lifestyle choices, including: Daily care of your teeth and gums. Regular physical activity. Eating a healthy diet. Avoiding tobacco and drug use. Limiting alcohol use. Practicing safe sex. Taking low doses of aspirin every day. Taking vitamin and mineral supplements as recommended by your health care provider. What happens during an annual well check? The services and screenings done by your health care provider during your annual well check will depend on your age, overall health, lifestyle risk factors, and family history of disease. Counseling  Your health care provider may ask you questions about your: Alcohol use. Tobacco use. Drug use. Emotional well-being. Home and relationship well-being. Sexual activity. Eating habits. History of falls. Memory and ability to understand (cognition). Work and work Astronomerenvironment. Screening  You may have the following tests or  measurements: Height, weight, and BMI. Blood pressure. Lipid and cholesterol levels. These may be checked every 5 years, or more frequently if you are over 66 years old. Skin check. Lung cancer screening. You may have this screening every year starting at age 66 if you have a 30-pack-year history of smoking and currently smoke or have quit within the past 15 years. Fecal occult blood test (FOBT) of the stool. You may have this test every year starting at age 66. Flexible sigmoidoscopy or colonoscopy. You may have a sigmoidoscopy every 5 years or a colonoscopy every 10 years starting at age 66. Prostate cancer screening. Recommendations will vary depending on your family history and other risks. Hepatitis C blood test. Hepatitis B blood test. Sexually transmitted disease (STD) testing. Diabetes screening. This is done by checking your blood sugar (glucose) after you have not eaten for a while (fasting). You may have this done every 1-3 years. Abdominal aortic aneurysm (AAA) screening. You may need this if you are a current or former smoker. Osteoporosis. You may be screened starting at age 870 if you are at high risk. Talk with your health care provider about your test results, treatment options, and if necessary, the need for more tests. Vaccines  Your health care provider may recommend certain vaccines, such as: Influenza vaccine. This is recommended every year. Tetanus, diphtheria, and acellular pertussis (Tdap, Td) vaccine. You may need a Td booster every 10 years. Zoster vaccine. You may need this after age 66. Pneumococcal 13-valent conjugate (PCV13) vaccine. One dose is recommended after age 66. Pneumococcal polysaccharide (PPSV23) vaccine. One dose is recommended after age 66. Talk to your health care provider about which screenings and vaccines you need and how often you  need them. This information is not intended to replace advice given to you by your health care provider. Make sure  you discuss any questions you have with your health care provider. Document Released: 08/26/2015 Document Revised: 04/18/2016 Document Reviewed: 05/31/2015 Elsevier Interactive Patient Education  2017 [employer].  Fall Prevention in the Home Falls can cause injuries. They can happen to people of all ages. There are many things you can do to make your home safe and to help prevent falls. What can I do on the outside of my home? Regularly fix the edges of walkways and driveways and fix any cracks. Remove anything that might make you trip as you walk through a door, such as a raised step or threshold. Trim any bushes or trees on the path to your home. Use bright outdoor lighting. Clear any walking paths of anything that might make someone trip, such as rocks or tools. Regularly check to see if handrails are loose or broken. Make sure that both sides of any steps have handrails. Any raised decks and porches should have guardrails on the edges. Have any leaves, snow, or ice cleared regularly. Use sand or salt on walking paths during winter. Clean up any spills in your garage right away. This includes oil or grease spills. What can I do in the bathroom? Use night lights. Install grab bars by the toilet and in the tub and shower. Do not use towel bars as grab bars. Use non-skid mats or decals in the tub or shower. If you need to sit down in the shower, use a plastic, non-slip stool. Keep the floor dry. Clean up any water that spills on the floor as soon as it happens. Remove soap buildup in the tub or shower regularly. Attach bath mats securely with double-sided non-slip rug tape. Do not have throw rugs and other things on the floor that can make you trip. What can I do in the bedroom? Use night lights. Make sure that you have a light by your bed that is easy to reach. Do not use any sheets or blankets that are too big for your bed. They should not hang down onto the floor. Have a firm  chair that has side arms. You can use this for support while you get dressed. Do not have throw rugs and other things on the floor that can make you trip. What can I do in the kitchen? Clean up any spills right away. Avoid walking on wet floors. Keep items that you use a lot in easy-to-reach places. If you need to reach something above you, use a strong step stool that has a grab bar. Keep electrical cords out of the way. Do not use floor polish or wax that makes floors slippery. If you must use wax, use non-skid floor wax. Do not have throw rugs and other things on the floor that can make you trip. What can I do with my stairs? Do not leave any items on the stairs. Make sure that there are handrails on both sides of the stairs and use them. Fix handrails that are broken or loose. Make sure that handrails are as long as the stairways. Check any carpeting to make sure that it is firmly attached to the stairs. Fix any carpet that is loose or worn. Avoid having throw rugs at the top or bottom of the stairs. If you do have throw rugs, attach them to the floor with carpet tape. Make sure that you have a light switch  at the top of the stairs and the bottom of the stairs. If you do not have them, ask someone to add them for you. What else can I do to help prevent falls? Wear shoes that: Do not have high heels. Have rubber bottoms. Are comfortable and fit you well. Are closed at the toe. Do not wear sandals. If you use a stepladder: Make sure that it is fully opened. Do not climb a closed stepladder. Make sure that both sides of the stepladder are locked into place. Ask someone to hold it for you, if possible. Clearly mark and make sure that you can see: Any grab bars or handrails. First and last steps. Where the edge of each step is. Use tools that help you move around (mobility aids) if they are needed. These include: Canes. Walkers. Scooters. Crutches. Turn on the lights when you go  into a dark area. Replace any light bulbs as soon as they burn out. Set up your furniture so you have a clear path. Avoid moving your furniture around. If any of your floors are uneven, fix them. If there are any pets around you, be aware of where they are. Review your medicines with your doctor. Some medicines can make you feel dizzy. This can increase your chance of falling. Ask your doctor what other things that you can do to help prevent falls. This information is not intended to replace advice given to you by your health care provider. Make sure you discuss any questions you have with your health care provider. Document Released: 05/26/2009 Document Revised: 01/05/2016 Document Reviewed: 09/03/2014 Elsevier Interactive Patient Education  2017 [employer].

## 2022-11-22 NOTE — Progress Notes (Signed)
Subjective:   Jeremy Hendricks is a 67 y.o. male who presents for Medicare Annual/Subsequent preventive examination.  I connected with  Jeremy Hendricks on 11/22/22 by a telephone enabled telemedicine application and verified that I am speaking with the correct person using two identifiers.   I discussed the limitations of evaluation and management by telemedicine. The patient expressed understanding and agreed to proceed.  Patient location: home  Provider location: telephone/home    Review of Systems     Cardiac Risk Factors include: advanced age (>28men, >37 women);male gender;hypertension     Objective:    Today's Vitals   There is no height or weight on file to calculate BMI.     11/22/2022    8:40 AM 07/01/2017    8:45 AM  Advanced Directives  Does Patient Have a Medical Advance Directive? Yes No  Type of Advance Directive Healthcare Power of Attorney   Does patient want to make changes to medical advance directive? No - Patient declined   Copy of Healthcare Power of Attorney in Chart? No - copy requested   Would patient like information on creating a medical advance directive?  No - Patient declined    Current Medications (verified) Outpatient Encounter Medications as of 11/22/2022  Medication Sig   albuterol (VENTOLIN HFA) 108 (90 Base) MCG/ACT inhaler Inhale 2 puffs into the lungs every 6 (six) hours as needed for wheezing or shortness of breath.   allopurinol (ZYLOPRIM) 300 MG tablet TAKE 1 TABLET BY MOUTH EVERY DAY   atenolol (TENORMIN) 25 MG tablet TAKE 1 TABLET BY MOUTH EVERY DAY   emtricitabine-tenofovir (TRUVADA) 200-300 MG tablet TAKE ONE TABLET BY MOUTH ONCE DAILY WITH OR WITHOUT FOOD. STORE IN ORIGINAL CONTAINER AT ROOM TEMPERATURE.   furosemide (LASIX) 20 MG tablet Take 1 tablet (20 mg total) by mouth every other day.   gabapentin (NEURONTIN) 100 MG capsule Take 1 capsule (100 mg total) by mouth at bedtime. As needed for neuropathy.   HYDROcodone  bit-homatropine (HYCODAN) 5-1.5 MG/5ML syrup Take 5 mLs by mouth at bedtime as needed for cough.   Multiple Vitamin (MULTIVITAMIN) tablet Take 1 tablet by mouth daily.   mupirocin ointment (BACTROBAN) 2 % Apply topically 2 (two) times daily.   nitroGLYCERIN (NITROSTAT) 0.4 MG SL tablet Place 0.4 mg under the tongue every 5 (five) minutes as needed for chest pain. Reported on 09/21/2015   rosuvastatin (CRESTOR) 40 MG tablet TAKE 1 TABLET BY MOUTH EVERY DAY   terbinafine (LAMISIL) 250 MG tablet TAKE 1 TABLET BY MOUTH EVERY DAY   Testosterone 30 MG/ACT SOLN APPLY 1 PUMP EVERY DAY   valACYclovir (VALTREX) 500 MG tablet Take 1 tablet (500 mg total) by mouth daily.   Colchicine 0.6 MG CAPS TAKE 1 TABLETS ON FIRST DAY OF FLARE UP. THEN TAKE 1 DAILY UNTIL FLARE UP RESOLVED (Patient not taking: Reported on 11/22/2022)   No facility-administered encounter medications on file as of 11/22/2022.    Allergies (verified) Patient has no known allergies.   History: Past Medical History:  Diagnosis Date   Atrial fibrillation    postoperative   Chronic diastolic CHF (congestive heart failure)    CKD (chronic kidney disease), stage II    Coronary artery disease    a. s/p CABG 2002. b. s/p redo 2012.   DJD (degenerative joint disease)    Gout    History of ETT    a. ETT 6/16:  normal   Past Surgical History:  Procedure Laterality Date  CORONARY ARTERY BYPASS GRAFT  2002   CORONARY ARTERY BYPASS GRAFT  08-2010   L-LAD remained from original CABG; new grafts incl L radial- PDA + RIMA-RI   VASECTOMY     Family History  Problem Relation Age of Onset   Heart attack Father [id number]   Hypertension Father    Cancer Mother        Breast cancer   Cancer Other        family hx of   Coronary artery disease Other        family hx of   Hyperlipidemia Other        family hx of   Hypertension Paternal Grandfather    Healthy Son    Healthy Daughter    Stroke Neg Hx    Colon cancer Neg Hx    Social History    Socioeconomic History   Marital status: Single    Spouse name: Not on file   Number of children: 2   Years of education: Not on file   Highest education level: Not on file  Occupational History   Occupation: Realtor  Tobacco Use   Smoking status: Former    Types: Cigarettes    Quit date: 08/14/1999    Years since quitting: 23.2    Passive exposure: Past   Smokeless tobacco: Never   Tobacco comments:    Social smoker  Vaping Use   Vaping Use: Never used  Substance and Sexual Activity   Alcohol use: Yes    Comment: 1oz-2oz daily    Drug use: No   Sexual activity: Yes  Other Topics Concern   Not on file  Social History Narrative   Single   Education: College   Exercise: Yes   Social Determinants of Health   Financial Resource Strain: Low Risk  (11/22/2022)   Overall Financial Resource Strain (CARDIA)    Difficulty of Paying Living Expenses: Not hard at all  Food Insecurity: No Food Insecurity (11/22/2022)   Hunger Vital Sign    Worried About Running Out of Food in the Last Year: Never true    Ran Out of Food in the Last Year: Never true  Transportation Needs: No Transportation Needs (11/22/2022)   PRAPARE - Administrator, Civil ServiceTransportation    Lack of Transportation (Medical): No    Lack of Transportation (Non-Medical): No  Physical Activity: Sufficiently Active (11/22/2022)   Exercise Vital Sign    Days of Exercise per Week: 5 days    Minutes of Exercise per Session: 50 min  Stress: No Stress Concern Present (11/22/2022)   [employer] - Occupational Stress Questionnaire    Feeling of Stress : Not at all  Social Connections: Unknown (11/22/2022)   Social Connection and Isolation Panel [NHANES]    Frequency of Communication with Friends and Family: More than three times a week    Frequency of Social Gatherings with Friends and Family: More than three times a week    Attends Religious Services: 1 to 4 times per year    Active Member of [employer]ubs or Organizations:  Yes    Attends Engineer, structuralClub or Organization Meetings: More than 4 times per year    Marital Status: Not on file    Tobacco Counseling Counseling given: Not Answered Tobacco comments: Social smoker   Clinical Intake:  Pre-visit preparation completed: Yes  Pain : No/denies pain        How often do you need to have someone help you when you read instructions, pamphlets, or  other written materials from your doctor or pharmacy?: 1 - Never  Diabetic?  no  Interpreter Needed?: No  Information entered by :: Remi Haggard LPN   Activities of Daily Living    11/22/2022    8:41 AM 11/18/2022    9:16 AM  In your present state of health, do you have any difficulty performing the following activities:  Hearing? 1 1  Vision? 0 0  Difficulty concentrating or making decisions? 0 0  Walking or climbing stairs? 0 0  Dressing or bathing? 0 0  Doing errands, shopping? 0 0  Preparing Food and eating ? N N  Using the Toilet? N N  In the past six months, have you accidently leaked urine? N N  Do you have problems with loss of bowel control? N N  Managing your Medications? N N  Managing your Finances? N N  Housekeeping or managing your Housekeeping? N N    Patient Care Team: Shade Flood, MD as PCP - General (Family Medicine) Kathleene Hazel, MD as PCP - Cardiology (Cardiology)  Indicate any recent Medical Services you may have received from other than [facility] providers in the past year (date may be approximate).     Assessment:   This is a routine wellness examination for Wille.  Hearing/Vision screen Hearing Screening - Comments:: Bilateral hearing aids Vision Screening - Comments:: Not up to date  Dietary issues and exercise activities discussed: Current Exercise Habits: Structured exercise class, Type of exercise: strength training/weights;walking;treadmill;stretching, Time (Minutes): 50, Frequency (Times/Week): 5, Weekly Exercise (Minutes/Week): 250, Intensity: Moderate    Goals Addressed             This Visit's Progress    Patient Stated       Continue current lifestyle       Depression Screen    11/22/2022    8:44 AM 07/30/2022    2:45 PM 07/09/2022    8:18 AM 08/29/2021   11:39 AM 06/22/2021    1:20 PM 06/19/2021    9:49 AM 03/24/2021    1:43 PM  PHQ 2/9 Scores  PHQ - 2 Score 0 0 0 0 0 0 0  PHQ- 9 Score 1 0 1   0 0    Fall Risk    11/18/2022    9:16 AM 07/30/2022    2:45 PM 07/09/2022    8:18 AM 08/29/2021   11:38 AM 06/22/2021    1:20 PM  Fall Risk   Falls in the past year? 0 0 0 0 0  Number falls in past yr: 0 0 0 0 0  Injury with Fall? 0 0 0 0 0  Risk for fall due to :  No Fall Risks No Fall Risks No Fall Risks History of fall(s)  Follow up  Falls evaluation completed Falls evaluation completed Falls evaluation completed Falls evaluation completed    FALL RISK PREVENTION PERTAINING TO THE HOME:  Any stairs in or around the home? No  If so, are there any without handrails? No  Home free of loose throw rugs in walkways, pet beds, electrical cords, etc? Yes  Adequate lighting in your home to reduce risk of falls? Yes   ASSISTIVE DEVICES UTILIZED TO PREVENT FALLS:  Life alert? No  Use of a cane, walker or w/c? No  Grab bars in the bathroom? No  Shower chair or bench in shower? No  Elevated toilet seat or a handicapped toilet? No   TIMED UP AND GO:  Was the test  performed? No .    Cognitive Function:        11/22/2022    8:42 AM  6CIT Screen  What Year? 0 points  What month? 0 points  What time? 0 points  Count back from 20 0 points  Months in reverse 0 points  Repeat phrase 0 points  Total Score 0 points    Immunizations Immunization History  Administered Date(s) Administered   Hepatitis B 09/20/2000   Influenza Split 05/14/2015, 06/11/2016   Influenza,inj,Quad PF,6+ Mos 04/04/2017, 04/15/2018, 05/05/2019, 04/20/2020   Influenza-Unspecified 04/04/2017   PFIZER Comirnaty(Gray Top)Covid-19 Tri-Sucrose  Vaccine 12/29/2020   PFIZER(Purple Top)SARS-COV-2 Vaccination 10/24/2019, 11/17/2019, 06/09/2020, 05/16/2021, 02/14/2022   PNEUMOCOCCAL CONJUGATE-20 02/14/2022   Pfizer Covid-19 Vaccine Bivalent Booster 75yrs & up 05/16/2021   Tdap 02/11/2017   Unspecified SARS-COV-2 Vaccination 06/01/2022   Zoster, Live 08/13/2013    TDAP status: Up to date  Flu Vaccine status: Up to date  Pneumococcal vaccine status: Up to date  Covid-19 vaccine status: Information provided on how to obtain vaccines.   Qualifies for Shingles Vaccine? Yes   Zostavax completed No   Shingrix Completed?: Yes  Screening Tests Health Maintenance  Topic Date Due   Zoster Vaccines- Shingrix (2 of 2) 10/12/2022   COVID-19 Vaccine (8 - 2023-24 season) 12/08/2022 (Originally 07/27/2022)   INFLUENZA VACCINE  03/14/2023   Medicare Annual Wellness (AWV)  11/22/2023   DTaP/Tdap/Td (2 - Td or Tdap) 02/12/2027   COLONOSCOPY (Pts 45-66yrs Insurance coverage will need to be confirmed)  07/02/2027   Pneumonia Vaccine 34+ Years old  Completed   Hepatitis C Screening  Completed   HIV Screening  Completed   HPV VACCINES  Aged Out    Health Maintenance  Health Maintenance Due  Topic Date Due   Zoster Vaccines- Shingrix (2 of 2) 10/12/2022    Colorectal cancer screening: Type of screening: Colonoscopy. Completed 2018. Repeat every 10 years  Lung Cancer Screening: (Low Dose CT Chest recommended if Age 1-80 years, 30 pack-year currently smoking OR have quit w/in 15years.) does not qualify.   Lung Cancer Screening Referral:   Additional Screening:  Hepatitis C Screening: does not qualify; Completed 2018  Vision Screening: Recommended annual ophthalmology exams for early detection of glaucoma and other disorders of the eye. Is the patient up to date with their annual eye exam?  No  Who is the provider or what is the name of the office in which the patient attends annual eye exams?  If pt is not established with a  provider, would they like to be referred to a provider to establish care? No .   Dental Screening: Recommended annual dental exams for proper oral hygiene  Community Resource Referral / Chronic Care Management: CRR required this visit?  No   CCM required this visit?  No      Plan:     I have personally reviewed and noted the following in the patient's chart:   Medical and social history Use of alcohol, tobacco or illicit drugs  Current medications and supplements including opioid prescriptions. Patient is not currently taking opioid prescriptions. Functional ability and status Nutritional status Physical activity Advanced directives List of other physicians Hospitalizations, surgeries, and ER visits in previous 12 months Vitals Screenings to include cognitive, depression, and falls Referrals and appointments  In addition, I have reviewed and discussed with patient certain preventive protocols, quality metrics, and best practice recommendations. A written personalized care plan for preventive services as well as general preventive health  recommendations were provided to patient.     Remi Haggard, LPN   [id number]/61/[id number]   Nurse Notes:

## 2022-11-22 NOTE — Telephone Encounter (Signed)
Pt is asking if he should take low dose aspirin please advise

## 2022-11-23 ENCOUNTER — Encounter: Payer: Self-pay | Admitting: Cardiovascular Disease

## 2022-11-23 ENCOUNTER — Other Ambulatory Visit: Payer: Self-pay

## 2022-11-23 DIAGNOSIS — E291 Testicular hypofunction: Secondary | ICD-10-CM

## 2022-11-23 DIAGNOSIS — R7989 Other specified abnormal findings of blood chemistry: Secondary | ICD-10-CM

## 2022-11-23 MED ORDER — TESTOSTERONE 30 MG/ACT TD SOLN: TRANSDERMAL | 5 refills | Status: DC

## 2022-11-23 NOTE — Telephone Encounter (Signed)
Testosterone injection LOV: 07/30/22 Last Refill:03/21/22 Upcoming appt: none

## 2022-11-23 NOTE — Telephone Encounter (Signed)
Controlled substance database reviewed.  Testosterone filled on 07/29/2022, previously 05/29/2022.  Monitoring labs and visit in November 2023, refill ordered.

## 2022-11-26 ENCOUNTER — Other Ambulatory Visit: Payer: Self-pay | Admitting: Cardiovascular Disease

## 2022-11-26 ENCOUNTER — Other Ambulatory Visit: Payer: Self-pay | Admitting: Physician Assistant

## 2022-11-26 NOTE — Telephone Encounter (Signed)
Last Fill: 08/29/2022  Labs: 07/09/2022 Glucose 100, BUN 24, Creat. 1.61, GFR 44.57, Monocytes Relative 18.9, Monocytes Absolute 1.5  Next Visit:  Due May 2024. Message sent to the front to schedule.   Last Visit: 07/10/2022  DX: Idiopathic chronic gout of multiple sites without tophus   Current Dose per office note 07/10/2022:  allopurinol 300 mg p.o. daily   Okay to refill Allopurinol?

## 2022-11-26 NOTE — Telephone Encounter (Signed)
Please schedule patient a follow up visit. Patient due May 2024. Thanks!  

## 2022-11-28 NOTE — Progress Notes (Signed)
Patient Medicare AWV questionnaire was completed by the patient on *DOS, I have confirmed that all information answered by patient is correct and no changes since this date.

## 2022-12-01 ENCOUNTER — Other Ambulatory Visit: Payer: Self-pay | Admitting: Cardiovascular Disease

## 2022-12-07 NOTE — Telephone Encounter (Signed)
Called and testosterone is not on formulary and does not give the CMM option to initiate PA per the pharmacy but it can still be sent in please begin processing asap considering pt was to start this 2 weeks ago

## 2022-12-11 ENCOUNTER — Telehealth: Payer: Self-pay

## 2022-12-11 NOTE — Telephone Encounter (Signed)
Pt called reported need for PA pt has gone without this was sent in on 11/25/22

## 2022-12-14 ENCOUNTER — Ambulatory Visit (HOSPITAL_COMMUNITY)
Admission: EM | Admit: 2022-12-14 | Discharge: 2022-12-14 | Disposition: A | Discharge status: Home / Self Care | Payer: Medicare PPO

## 2022-12-14 ENCOUNTER — Encounter (HOSPITAL_COMMUNITY): Payer: Self-pay

## 2022-12-14 ENCOUNTER — Ambulatory Visit (INDEPENDENT_AMBULATORY_CARE_PROVIDER_SITE_OTHER): Payer: Medicare PPO

## 2022-12-14 DIAGNOSIS — M19041 Primary osteoarthritis, right hand: Secondary | ICD-10-CM | POA: Diagnosis not present

## 2022-12-14 DIAGNOSIS — S6710XA Crushing injury of unspecified finger(s), initial encounter: Secondary | ICD-10-CM

## 2022-12-14 DIAGNOSIS — S6990XA Unspecified injury of unspecified wrist, hand and finger(s), initial encounter: Secondary | ICD-10-CM | POA: Diagnosis not present

## 2022-12-14 MED ORDER — DOXYCYCLINE HYCLATE 100 MG PO CAPS: 100.0000 mg | ORAL_CAPSULE | Freq: Two times a day (BID) | ORAL | 0 refills | Status: DC

## 2022-12-14 MED ORDER — HYDROCODONE-ACETAMINOPHEN 5-325 MG PO TABS: 2.0000 | ORAL_TABLET | Freq: Four times a day (QID) | ORAL | 0 refills | Status: AC | PRN

## 2022-12-14 NOTE — Telephone Encounter (Signed)
PA has been APPROVED from 12/13/2022-08/12/2023

## 2022-12-14 NOTE — ED Provider Notes (Signed)
[facility]    CSN: [id number] Arrival date & time: 12/14/22  1039      History   Chief Complaint Chief Complaint  Patient presents with   Finger Injury    HPI Jeremy Hendricks is a 67 y.o. male.   Patient presents to clinic for right thumb pain, bruising and swelling after crushing it in a car door 3 days prior.  At home he has been icing it, elevating it, and trying some leftover tramadol without much pain relief.  Reports sensation and movement intact.  Has been taking some leftover amoxicillin, he was concerned that it was infected due to the pain and swelling.   The history is provided by the patient and medical records.    Past Medical History:  Diagnosis Date   Atrial fibrillation (HCC)    postoperative   Chronic diastolic CHF (congestive heart failure) (HCC)    CKD (chronic kidney disease), stage II    Coronary artery disease    a. s/p CABG 2002. b. s/p redo 2012.   DJD (degenerative joint disease)    Gout    History of ETT    a. 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 Authorizing Provider  albuterol (VENTOLIN HFA) 108 (90 Base) MCG/ACT inhaler Inhale 2 puffs into the lungs every 6 (six) hours as needed for wheezing or shortness of breath. 08/21/21  Yes Waldon Merl, PA-C  allopurinol (ZYLOPRIM) 300 MG tablet TAKE 1 TABLET BY MOUTH EVERY DAY 11/26/22  Yes Deveshwar, Janalyn Rouse, MD  atenolol (TENORMIN) 25 MG tablet TAKE 1 TABLET BY MOUTH EVERY DAY 12/03/22  Yes Kathleene Hazel, MD  doxycycline (VIBRAMYCIN) 100 MG capsule Take 1 capsule (100 mg total) by mouth 2 (two) times daily. 12/14/22  Yes Rinaldo Ratel, [place] N, FNP  emtricitabine-tenofovir (TRUVADA) 200-300 MG tablet TAKE ONE TABLET BY MOUTH ONCE DAILY WITH OR WITHOUT FOOD. STORE IN ORIGINAL CONTAINER AT ROOM TEMPERATURE. 07/09/22  Yes Shade Flood, MD  furosemide (LASIX) 20 MG tablet TAKE 1 TABLET BY MOUTH EVERY OTHER DAY 11/27/22  Yes Kathleene Hazel, MD  HYDROcodone-acetaminophen (NORCO/VICODIN) 5-325 MG tablet Take 2 tablets by mouth every 6 (six) hours as needed for up to 3 days. 12/14/22 12/17/22 Yes Rinaldo Ratel, [place] N, FNP  Multiple Vitamin (MULTIVITAMIN) tablet Take 1 tablet by mouth daily.   Yes [provider]  rosuvastatin (CRESTOR) 40 MG tablet TAKE 1 TABLET  BY MOUTH EVERY DAY 01/22/22  Yes Kathleene Hazel, MD  terbinafine (LAMISIL) 250 MG tablet TAKE 1 TABLET BY MOUTH EVERY DAY 11/23/22  Yes Shade Flood, MD  Testosterone 30 MG/ACT SOLN APPLY 1 PUMP EVERY DAY 11/23/22  Yes Shade Flood, MD  Colchicine 0.6 MG CAPS TAKE 1 TABLETS ON FIRST DAY OF FLARE UP. THEN TAKE 1 DAILY UNTIL FLARE UP RESOLVED Patient not taking: Reported on 11/22/2022 11/01/21   Pollyann Savoy, MD  gabapentin (NEURONTIN) 100 MG capsule Take 1 capsule (100 mg total) by mouth at bedtime. As needed for neuropathy. 07/09/22   Shade Flood, MD    Family History Family History  Problem Relation Age of Onset   Heart attack Father 48    Hypertension Father    Cancer Mother        Breast cancer   Cancer Other        family hx of   Coronary artery disease Other        family hx of   Hyperlipidemia Other        family hx of   Hypertension Paternal Grandfather    Healthy Son    Healthy Daughter    Stroke Neg Hx    Colon cancer Neg Hx     Social History Social History   Tobacco Use   Smoking status: Former    Types: Cigarettes    Quit date: 08/14/1999    Years since quitting: 23.3    Passive exposure: Past   Smokeless tobacco: Never   Tobacco comments:    Social smoker  Vaping Use   Vaping Use: Never used  Substance Use Topics   Alcohol use: Yes    Comment: 1oz-2oz daily    Drug use: No     Allergies   Patient has no known allergies.   Review of Systems Review of Systems  Constitutional:  Negative for fatigue and fever.  Musculoskeletal:  Positive for joint swelling.  Skin:  Positive for wound. Negative for rash.     Physical Exam Triage Vital Signs ED Triage Vitals  Enc Vitals Group     BP 12/14/22 1152 132/69     Pulse Rate 12/14/22 1152 (!) 56     Resp 12/14/22 1152 18     Temp 12/14/22 1152 98 F (36.7 C)     Temp Source 12/14/22 1152 Oral     SpO2 12/14/22 1152 94 %     Weight --      Height --      Head Circumference --      Peak Flow --      Pain Score 12/14/22 1149 6     Pain Loc --      Pain Edu? --      Excl. in GC? --    No data found.  Updated Vital Signs BP 132/69 (BP Location: Left Arm)   Pulse (!) 56   Temp 98 F (36.7 C) (Oral)   Resp 18   SpO2 94%   Visual Acuity Right Eye Distance:   Left Eye Distance:   Bilateral Distance:    Right Eye Near:   Left Eye Near:    Bilateral Near:     Physical Exam Vitals and nursing note reviewed.  Constitutional:      Appearance: Normal appearance.  HENT:     Head: Normocephalic and atraumatic.     Right Ear: External ear normal.     Left Ear: External ear normal.  Nose: Nose normal.     Mouth/Throat:      Mouth: Mucous membranes are moist.  Eyes:     Conjunctiva/sclera: Conjunctivae normal.  Cardiovascular:     Rate and Rhythm: Normal rate and regular rhythm.  Pulmonary:     Effort: Pulmonary effort is normal. No respiratory distress.  Musculoskeletal:        General: Swelling, tenderness and signs of injury present. No deformity. Normal range of motion.  Skin:    General: Skin is warm and dry.     Findings: Bruising present.  Neurological:     Mental Status: He is alert.  Psychiatric:        Behavior: Behavior is cooperative.      UC Treatments / Results  Labs (all labs ordered are listed, but only abnormal results are displayed) Labs Reviewed - No data to display  EKG   Radiology DG Finger Thumb Right  Result Date: 12/14/2022 CLINICAL DATA:  RIGHT thumb pain and bruising after shutting RIGHT thumb in car door EXAM: RIGHT THUMB 2+V COMPARISON:  11/12/2019 FINDINGS: Osseous mineralization normal. Degenerative changes at IP joint with joint space narrowing and spur formation. No acute fracture, dislocation, or bone destruction. IMPRESSION: Degenerative changes IP joint RIGHT thumb. No acute osseous abnormalities. Electronically Signed   By: Ulyses Southward M.D.   On: 12/14/2022 12:08    Procedures Procedures (including critical care time)  Medications Ordered in UC Medications - No data to display  Initial Impression / Assessment and Plan / UC Course  I have reviewed the triage vital signs and the nursing notes.  Pertinent labs & imaging results that were available during my care of the patient were reviewed by me and considered in my medical decision making (see chart for details).  Vitals and triage reviewed, patient is hemodynamically stable.  Right thumb neurovascularly intact, extensive bruising to nailbed, associated swelling and bruising surrounding nailbed.  Imaging obtained to rule out fracture, negative.  Discussed rest, ice and elevation.  Will provide short course of  Norco for the continued pain, encouraged anti-inflammatory like ibuprofen as well.  Given doxycycline to cover for infection.  Patient verbalized understanding, no questions at this time.     Final Clinical Impressions(s) / UC Diagnoses   Final diagnoses:  None   Discharge Instructions   None    ED Prescriptions     Medication Sig Dispense Auth. Provider   doxycycline (VIBRAMYCIN) 100 MG capsule Take 1 capsule (100 mg total) by mouth 2 (two) times daily. 20 capsule Rinaldo Ratel, [place] N, [place]   HYDROcodone-acetaminophen (NORCO/VICODIN) 5-325 MG tablet Take 2 tablets by mouth every 6 (six) hours as needed for up to 3 days. 10 tablet Veverly Larimer, [place] N, [place]      I have reviewed the PDMP during this encounter.   Ranae Casebier, [place] N, [place] 12/14/22 [phone]

## 2022-12-14 NOTE — ED Triage Notes (Signed)
Chief Complaint: right thumb [ain after crushing in the car door. Possible infection   Onset: 3 days ago   Prescriptions or OTC medications tried: Yes- amoxicillin, otc pain meds    with no relief

## 2022-12-17 ENCOUNTER — Encounter: Payer: Self-pay | Admitting: Cardiovascular Disease

## 2022-12-17 ENCOUNTER — Ambulatory Visit: Payer: Medicare PPO | Attending: Cardiovascular Disease | Admitting: Cardiovascular Disease

## 2022-12-17 VITALS — BP 120/72 | HR 47 | Ht 66.5 in | Wt 180.4 lb

## 2022-12-17 DIAGNOSIS — I5032 Chronic diastolic (congestive) heart failure: Secondary | ICD-10-CM | POA: Diagnosis not present

## 2022-12-17 DIAGNOSIS — I2510 Atherosclerotic heart disease of native coronary artery without angina pectoris: Secondary | ICD-10-CM

## 2022-12-17 DIAGNOSIS — E7849 Other hyperlipidemia: Secondary | ICD-10-CM

## 2022-12-17 NOTE — Patient Instructions (Signed)
Medication Instructions:  No changes *If you need a refill on your cardiac medications before your next appointment, please call your pharmacy*   Lab Work: none If you have labs (blood work) drawn today and your tests are completely normal, you will receive your results only by: MyChart Message (if you have MyChart) OR A paper copy in the mail If you have any lab test that is abnormal or we need to change your treatment, we will call you to review the results.   Testing/Procedures: none   Follow-Up: At [facility], you and your health needs are our priority.  As part of our continuing mission to provide you with exceptional heart care, we have created designated Provider Care Teams.  These Care Teams include your primary Cardiologist (physician) and Advanced Practice Providers (APPs -  Physician Assistants and Nurse Practitioners) who all work together to provide you with the care you need, when you need it.   Your next appointment:   12 month(s)  Provider:   Christopher McAlhany, MD      

## 2022-12-17 NOTE — Progress Notes (Signed)
Chief Complaint  Patient presents with   Follow-up    CAD   History of Present Illness: 67 yo male with history of CAD s/p CABG in 2002 and redo in January 2012, CKD stage 2 and chronic diastolic CHF who is here today for cardiac follow up. He saw Dr. Daleen Squibb in January 2012 with chest discomfort and was noted to have an abnormal Myoview. He was referred for cardiac catheterization by Dr. Antoine Poche. This demonstrated severe graft disease and native three-vessel CAD. He was referred for redo bypass. His LIMA to LAD remained patent. His grafts at his surgery on 09/01/10 with Dr. [name] included a left radial-PDA and a free RIMA-ramus intermedius. Postoperatively he developed atrial fibrillation with rapid ventricular rate and was placed on amiodarone. Echo March 2022 with normal LV function and no valve disease.  He has been treated with Lasix for chronic diastolic CHF.   He is here today for follow up. The patient denies any chest pain, dyspnea, palpitations, lower extremity edema, orthopnea, PND, dizziness, near syncope or syncope.   Primary Care Physician: Shade Flood, MD  Past Medical History:  Diagnosis Date   Atrial fibrillation [facility])    postoperative   Chronic diastolic CHF (congestive heart failure) (HCC)    CKD (chronic kidney disease), stage II    Coronary artery disease    a. s/p CABG 2002. b. s/p redo 2012.   DJD (degenerative joint disease)    Gout    History of ETT    a. ETT 6/16:  normal    Past Surgical History:  Procedure Laterality Date   CORONARY ARTERY BYPASS GRAFT  2002   CORONARY ARTERY BYPASS GRAFT  08-2010   L-LAD remained from original CABG; new grafts incl L radial- PDA + RIMA-RI   VASECTOMY      Current Outpatient Medications  Medication Sig Dispense Refill   albuterol (VENTOLIN HFA) 108 (90 Base) MCG/ACT inhaler Inhale 2 puffs into the lungs every 6 (six) hours as needed for wheezing or shortness of breath. 8 g 0   allopurinol (ZYLOPRIM) 300  MG tablet TAKE 1 TABLET BY MOUTH EVERY DAY 90 tablet 0   atenolol (TENORMIN) 25 MG tablet TAKE 1 TABLET BY MOUTH EVERY DAY 90 tablet 0   Colchicine 0.6 MG CAPS TAKE 1 TABLETS ON FIRST DAY OF FLARE UP. THEN TAKE 1 DAILY UNTIL FLARE UP RESOLVED 8 capsule 2   doxycycline (VIBRAMYCIN) 100 MG capsule Take 1 capsule (100 mg total) by mouth 2 (two) times daily. 20 capsule 0   emtricitabine-tenofovir (TRUVADA) 200-300 MG tablet TAKE ONE TABLET BY MOUTH ONCE DAILY WITH OR WITHOUT FOOD. STORE IN ORIGINAL CONTAINER AT ROOM TEMPERATURE. 90 tablet 2   furosemide (LASIX) 20 MG tablet TAKE 1 TABLET BY MOUTH EVERY OTHER DAY 45 tablet 0   gabapentin (NEURONTIN) 100 MG capsule Take 1 capsule (100 mg total) by mouth at bedtime. As needed for neuropathy. 30 capsule 1   HYDROcodone-acetaminophen (NORCO/VICODIN) 5-325 MG tablet Take 2 tablets by mouth every 6 (six) hours as needed for up to 3 days. 10 tablet 0   Multiple Vitamin (MULTIVITAMIN) tablet Take 1 tablet by mouth daily.     rosuvastatin (CRESTOR) 40 MG tablet TAKE 1 TABLET BY MOUTH EVERY DAY 90 tablet 3   terbinafine (LAMISIL) 250 MG tablet TAKE 1 TABLET BY MOUTH EVERY DAY 90 tablet 0   Testosterone 30 MG/ACT SOLN APPLY 1 PUMP EVERY DAY 90 mL 5   No current  facility-administered medications for this visit.    No Known Allergies  Social History   Socioeconomic History   Marital status: Single    Spouse name: Not on file   Number of children: 2   Years of education: Not on file   Highest education level: Not on file  Occupational History   Occupation: Realtor  Tobacco Use   Smoking status: Former    Types: Cigarettes    Quit date: 08/14/1999    Years since quitting: 23.3    Passive exposure: Past   Smokeless tobacco: Never   Tobacco comments:    Social smoker  Vaping Use   Vaping Use: Never used  Substance and Sexual Activity   Alcohol use: Yes    Comment: 1oz-2oz daily    Drug use: No   Sexual activity: Yes  Other Topics Concern   Not  on file  Social History Narrative   Single   Education: College   Exercise: Yes   Social Determinants of Health   Financial Resource Strain: Low Risk  (11/22/2022)   Overall Financial Resource Strain (CARDIA)    Difficulty of Paying Living Expenses: Not hard at all  Food Insecurity: No Food Insecurity (11/22/2022)   Hunger Vital Sign    Worried About Running Out of Food in the Last Year: Never true    Ran Out of Food in the Last Year: Never true  Transportation Needs: No Transportation Needs (11/22/2022)   PRAPARE - Administrator, Civil Service (Medical): No    Lack of Transportation (Non-Medical): No  Physical Activity: Sufficiently Active (11/22/2022)   Exercise Vital Sign    Days of Exercise per Week: 5 days    Minutes of Exercise per Session: 50 min  Stress: No Stress Concern Present (11/22/2022)   [employer] of Occupational Health - Occupational Stress Questionnaire    Feeling of Stress : Not at all  Social Connections: Unknown (11/22/2022)   Social Connection and Isolation Panel [NHANES]    Frequency of Communication with Friends and Family: More than three times a week    Frequency of Social Gatherings with Friends and Family: More than three times a week    Attends Religious Services: 1 to 4 times per year    Active Member of [employer] or Organizations: Yes    Attends Engineer, structural: More than 4 times per year    Marital Status: Not on file  Intimate Partner Violence: Not At Risk (11/22/2022)   Humiliation, Afraid, Rape, and Kick questionnaire    Fear of Current or Ex-Partner: No    Emotionally Abused: No    Physically Abused: No    Sexually Abused: No    Family History  Problem Relation Age of Onset   Heart attack Father 93   Hypertension Father    Cancer Mother        Breast cancer   Cancer Other        family hx of   Coronary artery disease Other        family hx of   Hyperlipidemia Other        family hx of   Hypertension  Paternal Grandfather    Healthy Son    Healthy Daughter    Stroke Neg Hx    Colon cancer Neg Hx     Review of Systems:  As stated in the HPI and otherwise negative.   BP 120/72   Pulse (!) 47   Ht 5' 6.5" (1.689  m)   Wt 81.8 kg   SpO2 98%   BMI 28.68 kg/m   Physical Examination: General: Well developed, well nourished, NAD  HEENT: OP clear, mucus membranes moist  SKIN: warm, dry. No rashes. Neuro: No focal deficits  Musculoskeletal: Muscle strength 5/5 all ext  Psychiatric: Mood and affect normal  Neck: No JVD, no carotid bruits, no thyromegaly, no lymphadenopathy.  Lungs:Clear bilaterally, no wheezes, rhonci, crackles Cardiovascular: Regular brady. No murmurs, gallops or rubs. Abdomen:Soft. Bowel sounds present. Non-tender.  Extremities: No lower extremity edema. Pulses are 2 + in the bilateral DP/PT.  EKG:  EKG is ordered today. The ekg ordered today demonstrates sinus brady, rate 47 bpm. 1st degree AV block  Echo April 2022:  1. Left ventricular ejection fraction, by estimation, is 55 to 60%. The  left ventricle has normal function. The left ventricle has no regional  wall motion abnormalities. Left ventricular diastolic parameters are  consistent with Grade II diastolic  dysfunction (pseudonormalization). Elevated left atrial pressure.   2. Right ventricular systolic function is mildly reduced. The right  ventricular size is normal. Tricuspid regurgitation signal is inadequate  for assessing PA pressure.   3. The mitral valve is normal in structure. No evidence of mitral valve  regurgitation. No evidence of mitral stenosis.   4. The aortic valve is normal in structure. Aortic valve regurgitation is  not visualized. No aortic stenosis is present.   5. The inferior vena cava is normal in size with greater than 50%  respiratory variability, suggesting right atrial pressure of 3 mmHg.   Recent Labs: 07/09/2022: ALT 21; Hemoglobin 16.3; Platelets 176.0; TSH  3.26 08/31/2022: BUN 20; Creatinine, Ser 1.43; Potassium 4.4; Sodium 140   Lipid Panel    Component Value Date/Time   CHOL 88 07/09/2022 0937   CHOL 89 (L) 11/02/2020 1114   TRIG 109.0 07/09/2022 0937   HDL 23.90 (L) 07/09/2022 0937   HDL 23 (L) 11/02/2020 1114   CHOLHDL 4 07/09/2022 0937   VLDL 21.8 07/09/2022 0937   LDLCALC 43 07/09/2022 0937   LDLCALC 36 11/02/2020 1114     Wt Readings from Last 3 Encounters:  12/17/22 81.8 kg  07/31/22 82.1 kg  07/30/22 83.3 kg    Assessment and Plan:   1. CAD s/p redo CABG January 2012: No chest pain. Continue ASA, statin   2. Post-operative atrial fibrillation: No recurrence. Sinus today. Continue beta blocker.   3. Chronic diastolic CHF: Weight is stable. No evidence of volume overload. Continue Lasix.20 mg every other day   4. Hyperlipidemia: LDL 43 in November 2023. Continue statin.   Labs/ tests ordered today include:   Orders Placed This Encounter  Procedures   EKG 12-Lead   Disposition:   FU with me in 12  months   Signed, Verne Carrow, MD 12/17/2022 11:12 AM    [facility] [address] [place], [place], [place]  [address] Phone: [phone]; Fax: [phone]

## 2022-12-19 ENCOUNTER — Other Ambulatory Visit: Payer: Self-pay | Admitting: Cardiovascular Disease

## 2022-12-19 NOTE — Telephone Encounter (Signed)
Testosterone

## 2022-12-20 ENCOUNTER — Other Ambulatory Visit (HOSPITAL_COMMUNITY): Payer: Self-pay

## 2022-12-20 ENCOUNTER — Telehealth: Payer: Self-pay

## 2022-12-20 NOTE — Telephone Encounter (Signed)
PA has been approved. Effective dates: 08/13/22 through 08/13/23

## 2022-12-20 NOTE — Telephone Encounter (Signed)
Patient Advocate Encounter  Prior Authorization for Testosterone 30MG /ACT solution has been approved with [employer].    PA# [id number] Key: [id number]  Effective dates: 08/13/22 through 08/13/23

## 2023-01-03 ENCOUNTER — Ambulatory Visit (INDEPENDENT_AMBULATORY_CARE_PROVIDER_SITE_OTHER)
Admission: RE | Admit: 2023-01-03 | Discharge: 2023-01-03 | Disposition: A | Discharge status: Home / Self Care | Payer: Medicare PPO | Source: Ambulatory Visit | Attending: Family Medicine | Admitting: Family Medicine

## 2023-01-03 ENCOUNTER — Encounter: Payer: Self-pay | Admitting: Family Medicine

## 2023-01-03 ENCOUNTER — Ambulatory Visit: Payer: Medicare PPO | Admitting: Family Medicine

## 2023-01-03 VITALS — BP 122/74 | HR 78 | Temp 98.0°F | Ht 66.5 in | Wt 183.2 lb

## 2023-01-03 DIAGNOSIS — M79631 Pain in right forearm: Secondary | ICD-10-CM

## 2023-01-03 DIAGNOSIS — M7989 Other specified soft tissue disorders: Secondary | ICD-10-CM | POA: Diagnosis not present

## 2023-01-03 NOTE — Progress Notes (Signed)
Subjective:  Patient ID: Jeremy Hendricks, male    DOB: 12-21-1955  Age: 67 y.o. MRN: [id number]  CC:  Chief Complaint  Patient presents with   Arm Injury    Larey Seat just over a week ago, fell on his Rt arm and was having swelling and knot, notes Sunday went to tennis and has also continued to work out notes yesterday pain returned with some swelling.     HPI Jeremy Hendricks presents for   R arm pain: Larey Seat about one week ago. Tile floors in home, in socks, slipped and R mid forearm hit ledge on fireplace ledge, swelling noted - knot on area. No wrist, elbow or shoulder pain or difficulty with movement in joints, just sore in forearm. No initial treatment. Played tennis 4 days ago - some soreness playing, but did ok. Yesterday had more pain and swelling, throbbing. Increased pain last night. Used ice pack. Fingers felt numb last night, resolved today.  Forearm still swollen today.  No new hand/wrist pain/weakness.  Took leftover hydrocodone last night for pain.  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 s/p CABG 2002. b. s/p redo 2012.   DJD (degenerative joint disease)     Gout    History of ETT    a. ETT 6/16:  normal   Past Surgical History:  Procedure Laterality Date   CORONARY ARTERY BYPASS GRAFT  2002   CORONARY ARTERY BYPASS GRAFT  08-2010   L-LAD remained from original CABG; new grafts incl L radial- PDA + RIMA-RI   VASECTOMY     No Known Allergies Prior to Admission medications   Medication Sig Start Date End Date Taking? Authorizing Provider  albuterol (VENTOLIN HFA) 108 (90 Base) MCG/ACT inhaler Inhale 2 puffs into the lungs every 6 (six) hours as needed for wheezing or shortness of breath. 08/21/21  Yes Waldon Merl, PA-C  allopurinol (ZYLOPRIM) 300 MG tablet TAKE 1 TABLET BY MOUTH EVERY DAY 11/26/22  Yes Deveshwar, Janalyn Rouse, MD  atenolol (TENORMIN) 25 MG tablet TAKE 1 TABLET BY MOUTH EVERY DAY 12/03/22  Yes Kathleene Hazel, MD  Colchicine 0.6 MG CAPS TAKE 1 TABLETS ON FIRST DAY OF FLARE UP. THEN TAKE 1 DAILY UNTIL FLARE UP RESOLVED 11/01/21  Yes Deveshwar, Janalyn Rouse, MD  doxycycline (VIBRAMYCIN) 100 MG capsule Take 1 capsule (100 mg total) by mouth 2 (two) times daily. 12/14/22  Yes Rinaldo Ratel, [place] N, FNP  emtricitabine-tenofovir (TRUVADA) 200-300 MG tablet TAKE ONE TABLET BY MOUTH ONCE DAILY WITH OR WITHOUT FOOD. STORE IN ORIGINAL CONTAINER AT ROOM TEMPERATURE. 07/09/22  Yes Shade Flood, MD  furosemide (LASIX) 20 MG tablet TAKE 1 TABLET BY MOUTH EVERY OTHER DAY 11/27/22  Yes Kathleene Hazel, MD  gabapentin (NEURONTIN) 100 MG capsule Take 1 capsule (100 mg total) by mouth at bedtime. As needed for neuropathy. 07/09/22  Yes Shade Flood, MD  Multiple Vitamin (MULTIVITAMIN) tablet Take 1 tablet by mouth daily.   Yes [provider]  rosuvastatin (CRESTOR) 40 MG tablet TAKE 1 TABLET BY MOUTH EVERY DAY 12/19/22  Yes Kathleene Hazel, MD  terbinafine (LAMISIL) 250 MG tablet TAKE 1 TABLET BY MOUTH EVERY DAY 11/23/22  Yes Shade Flood, MD  Testosterone 30 MG/ACT SOLN APPLY 1 PUMP EVERY DAY 11/23/22  Yes Shade Flood, MD   Social History   Socioeconomic History   Marital status: Single    Spouse name: Not on file   Number of children: 2   Years of education: Not on file   Highest education level: Professional school degree (e.g., MD, DDS, DVM, JD)  Occupational History   Occupation: Realtor  Tobacco Use   Smoking status: Former    Types: Cigarettes    Quit date: 08/14/1999    Years since quitting: 23.4    Passive exposure: Past   Smokeless tobacco: Never   Tobacco comments:    Social smoker  Vaping Use   Vaping Use: Never used  Substance and Sexual Activity   Alcohol use: Yes    Comment: 1oz-2oz daily    Drug use: No   Sexual activity: Yes  Other Topics Concern   Not on file  Social History Narrative   Single   Education: College   Exercise: Yes   Social Determinants of Health   Financial Resource Strain: Low Risk  (01/02/2023)   Overall Financial Resource Strain (CARDIA)    Difficulty of Paying Living Expenses: Not hard at all  Food Insecurity: No Food Insecurity (01/02/2023)   Hunger Vital Sign    Worried About Running Out of Food in the Last Year: Never true    Ran Out of Food in the Last Year: Never true  Transportation Needs: No Transportation Needs (01/02/2023)   PRAPARE - Administrator, Civil Service (Medical): No    Lack of Transportation (Non-Medical): No  Physical Activity: Sufficiently Active (01/02/2023)   Exercise Vital Sign    Days of Exercise per Week: 5 days    Minutes of Exercise per Session: 50 min  Stress: No Stress Concern Present (01/02/2023)   [employer] of Occupational Health - Occupational Stress Questionnaire    Feeling of Stress : Only a little  Social Connections: Socially Integrated (01/02/2023)   Social Connection and Isolation Panel [NHANES]    Frequency of Communication with Friends and Family: More than three times a week    Frequency of Social Gatherings with Friends and Family: Once a week    Attends Religious Services: More  than 4 times per year    Active Member of [employer] or Organizations: Yes    Attends Banker Meetings: More than 4 times per year    Marital Status: Living with partner  Intimate Partner Violence: Not At Risk (11/22/2022)   Humiliation, Afraid, Rape, and Kick questionnaire    Fear of Current or Ex-Partner: No    Emotionally Abused: No    Physically Abused: No    Sexually Abused: No    Review of Systems   Objective:   Vitals:   01/03/23 1021  BP: 122/74  Pulse: 78  Temp:  98 F (36.7 C)  TempSrc: Temporal  SpO2: 99%  Weight: 183 lb 3.2 oz (83.1 kg)  Height: 5' 6.5" (1.689 m)     Physical Exam Constitutional:      General: He is not in acute distress.    Appearance: Normal appearance. He is well-developed.  HENT:     Head: Normocephalic and atraumatic.  Cardiovascular:     Rate and Rhythm: Normal rate.  Pulmonary:     Effort: Pulmonary effort is normal.  Musculoskeletal:     Comments: Right forearm, skin intact but swelling noted throughout forearm to wrist.  Distal ecchymosis most appreciated volar aspect.  Tender to palpation over the mid to distal ulna.  No appreciable deformity or skin tenting. Radius, scaphoid nontender.   Wrist, elbow pain-free range of motion, no bony tenderness.  Shoulder no bony tenderness.  Cap refill less than 1 second with warm fingertips.  Radial pulse intact, equal to left wrist.  Skin:    General: Skin is warm and dry.     Coloration: Skin is not pale.  Neurological:     Mental Status: He is alert and oriented to person, place, and time.  Psychiatric:        Mood and Affect: Mood normal.        Assessment & Plan:  Jeremy Hendricks is a 67 y.o. male . Right forearm pain - Plan: DG Forearm Right  Arm swelling Initial injury 1 week ago, contusion to mid R forearm. Minimal initial symptoms with worsening pain, swelling yesterday. Ecchymosis, with ttp of ulna on exam. Suspicious for ulnar shaft/nightstick fracture.   -will  check imaging, then likely ortho eval of fracture noted. Activity modification for now.  Tylenol for pain given cardiac history, let me know if stronger meds needed.  -Numbness in fingers last night with selling, now resolved and nvi distally. Does not appear to have compartment syndrome at this time but signs/symptoms and ER precautions given.     No orders of the defined types were placed in this encounter.  Patient Instructions  I am suspicious for a fracture or break in one of the forearm bones, specifically the ulna.  Lets check an x-ray first, [facility] location below.  If that does show a fracture, then we will get you into see orthopedics.  Try to avoid repetitive use of that arm for now, including grasping or lifting until we see x-ray results. Tylenol is fine if needed for pain, let me know if a stronger medication is needed.  Okay to elevate arm to help with swelling.  If you notice any numbness or tingling in the fingers that persists even when changing position, or any cold feeling to the fingers, pale fingers, be seen in the emergency room as we discussed.  [facility] or xray: Walk in 8:30-4:30 during weekdays, no appointment needed [address]  [place], [place] [address]     Signed,   Meredith Staggers, MD [facility], [facility] 01/03/23 11:55 AM

## 2023-01-03 NOTE — Patient Instructions (Addendum)
I am suspicious for a fracture or break in one of the forearm bones, specifically the ulna.  Lets check an x-ray first, [facility] location below.  If that does show a fracture, then we will get you into see orthopedics.  Try to avoid repetitive use of that arm for now, including grasping or lifting until we see x-ray results. Tylenol is fine if needed for pain, let me know if a stronger medication is needed.  Okay to elevate arm to help with swelling.  If you notice any numbness or tingling in the fingers that persists even when changing position, or any cold feeling to the fingers, pale fingers, be seen in the emergency room as we discussed.  [facility] or xray: Walk in 8:30-4:30 during weekdays, no appointment needed [address]  [place], [place] [address]

## 2023-01-24 NOTE — Progress Notes (Signed)
Office Visit Note  Patient: Jeremy Hendricks             Date of Birth: [birth year]           MRN: [id number]             PCP: Shade Flood, MD Referring: Shade Flood, MD Visit Date: 01/28/2023 Occupation: @GUAROCC @  Subjective:  Left knee pain   History of Present Illness: Jeremy Hendricks is a 67 y.o. male with history of gout and osteoarthritis.  He remains on Allopurinol 300 mg by mouth daily.  He has been tolerating allopurinol without any side effects and has not missed any doses recently.  Patient presents today with left knee joint pain which started last week.  According to the patient he attended a party and drank more alcohol than usual which could have been a trigger for a gout flare.  He states he also had a recent fall slipping at home while wearing socks on tile floor and landed on his knee several weeks ago.  Patient states that he has run out of her prescription for colchicine so through the weekend he took prednisone 5 mg 1 tablet daily x 5 days.  He has noticed some improvement with the use of prednisone.  He describes the pain in his left knee as a dull ache with swelling.  Patient requested x-rays of the left knee along with a refill of colchicine. He denies any other joint pain or joint swelling at this time.   Activities of Daily Living:  Patient reports morning stiffness for 5-10 minutes.   Patient Denies nocturnal pain.  Difficulty dressing/grooming: Denies Difficulty climbing stairs: Denies Difficulty getting out of chair: Denies Difficulty using hands for taps, buttons, cutlery, and/or writing: Denies  Review of Systems  Constitutional:  Positive for fatigue.  HENT:  Negative for mouth sores and mouth dryness.   Eyes:  Negative for dryness.  Respiratory:  Positive for cough, shortness of breath and wheezing.   Cardiovascular:  Negative for chest pain and palpitations.  Gastrointestinal:  Negative for blood in stool, constipation and diarrhea.   Endocrine: Negative for increased urination.  Genitourinary:  Positive for urgency. Negative for involuntary urination.  Musculoskeletal:  Positive for joint pain, joint pain, joint swelling, myalgias, morning stiffness, muscle tenderness and myalgias. Negative for gait problem and muscle weakness.  Skin:  Negative for color change, rash, hair loss and sensitivity to sunlight.  Allergic/Immunologic: Negative for susceptible to infections.  Neurological:  Negative for dizziness and headaches.  Hematological:  Negative for swollen glands.  Psychiatric/Behavioral:  Negative for depressed mood and sleep disturbance. The patient is not nervous/anxious.     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 s/p CABG 2002. b. s/p redo 2012.   DJD (degenerative joint disease)    Gout    History of ETT    a. ETT 6/16:  normal    Family History  Problem Relation Age of Onset   Heart attack Father 30   Hypertension Father    Cancer Mother        Breast cancer   Cancer Other        family hx of   Coronary artery disease Other        family hx of   Hyperlipidemia Other         family hx of   Hypertension Paternal Grandfather    Healthy Son    Healthy Daughter    Stroke Neg Hx    Colon cancer Neg Hx    Past Surgical History:  Procedure Laterality Date   CORONARY ARTERY BYPASS GRAFT  2002   CORONARY ARTERY BYPASS GRAFT  08-2010   L-LAD remained from original CABG; new grafts incl L radial- PDA + RIMA-RI   VASECTOMY     Social History   Social History Narrative   Single   Education: College   Exercise: Yes   Immunization History  Administered Date(s) Administered   Hepatitis B 09/20/2000   Influenza Split 05/14/2015, 06/11/2016   Influenza,inj,Quad PF,6+ Mos 04/04/2017, 04/15/2018, 05/05/2019, 04/20/2020   Influenza-Unspecified 04/04/2017   PFIZER Comirnaty(Gray Top)Covid-19 Tri-Sucrose Vaccine 12/29/2020   PFIZER(Purple Top)SARS-COV-2 Vaccination 10/24/2019, 11/17/2019, 06/09/2020, 05/16/2021, 02/14/2022   PNEUMOCOCCAL CONJUGATE-20 02/14/2022   Pfizer Covid-19 Vaccine Bivalent Booster 69yrs & up 05/16/2021   Tdap 02/11/2017   Unspecified SARS-COV-2 Vaccination 06/01/2022   Zoster, Live 08/13/2013     Objective: Vital Signs: BP 137/78 (BP Location: Left Arm, Patient Position: Sitting, Cuff Size: Normal)   Pulse (!) 52   Resp 17   Ht 5\' 7"  (1.702 m)   Wt 182 lb 12.8 oz (82.9 kg)   BMI 28.63 kg/m    Physical Exam Vitals and nursing note reviewed.  Constitutional:      Appearance: He is well-developed.  HENT:     Head: Normocephalic and atraumatic.  Eyes:     Conjunctiva/sclera: Conjunctivae normal.     Pupils: Pupils are equal, round, and reactive to light.  Cardiovascular:     Rate and Rhythm: Normal rate and regular rhythm.     Heart sounds: Normal heart sounds.  Pulmonary:     Effort: Pulmonary effort is normal.     Breath sounds: Normal breath sounds.  Abdominal:     General: Bowel sounds are normal.     Palpations: Abdomen is soft.  Musculoskeletal:     Cervical back: Normal range of motion and neck supple.  Skin:     General: Skin is warm and dry.     Capillary Refill: Capillary refill takes less than 2 seconds.  Neurological:     Mental Status: He is alert and oriented to person, place, and time.  Psychiatric:        Behavior: Behavior normal.      Musculoskeletal Exam: C-spine, thoracic spine, lumbar spine have good range of motion.  Shoulder joints, elbow joints, wrist joints, MCPs, PIPs, DIPs have good range of motion with no synovitis.  Complete fist formation bilaterally.  Hip joints have good range of motion with no groin pain.  Some discomfort  with range of motion of the left knee with a small effusion.  Right knee joint has good range of motion with no warmth or effusion.  Ankle joints have good range of motion with no tenderness or joint swelling.  CDAI Exam: CDAI Score: -- Patient Global: --; Provider Global: -- Swollen: --; Tender: -- Joint Exam 01/28/2023   No joint exam has been documented for this visit   There is currently no information documented on the homunculus. Go to the Rheumatology activity and complete the homunculus joint exam.  Investigation: No additional findings.  Imaging: DG Forearm Right  Result Date: 01/03/2023 CLINICAL DATA:  fall one week ago - pain mid ulna shaft - r/o fracture. EXAM: RIGHT FOREARM - 2 VIEW COMPARISON:  None Available. FINDINGS: There is no evidence of acute fracture. Alignment is normal. There is soft tissue swelling of the forearm. IMPRESSION: Soft tissue swelling of the forearm.  No acute osseous abnormality. Electronically Signed   By: Caprice Renshaw M.D.   On: 01/03/2023 12:56    Recent Labs: Lab Results  Component Value Date   WBC 8.0 07/09/2022   HGB 16.3 07/09/2022   PLT 176.0 07/09/2022   NA 140 08/31/2022   K 4.4 08/31/2022   CL 101 08/31/2022   CO2 24 08/31/2022   GLUCOSE 85 08/31/2022   BUN 20 08/31/2022   CREATININE 1.43 (H) 08/31/2022   BILITOT 0.5 07/09/2022   ALKPHOS 62 07/09/2022   AST 20 07/09/2022   ALT 21 07/09/2022    PROT 7.3 07/09/2022   ALBUMIN 4.9 07/09/2022   CALCIUM 9.5 08/31/2022   GFRAA 65 12/08/2020    Speciality Comments: No specialty comments available.  Procedures:  No procedures performed Allergies: Patient has no known allergies.    Assessment / Plan:     Visit Diagnoses: Idiopathic chronic gout of multiple sites without tophus -Patient presents today with symptoms consistent with a gout flare in the left knee.  He has been taking allopurinol 300 mg daily as prescribed, but he recently had alcohol while at a party which likely triggered the flare.  On examination he has some warmth and swelling in the left knee. Small effusion noted.  He started taking prednisone 5 mg 1 tablet daily for the last 5 days.  He ran out of his prescription for colchicine so a refill was sent to the pharmacy today.  He was also given a prednisone taper which he can complete to alleviate the current flare if needed.  He declined a cortisone injection in the left knee at this time.  Patient did request an x-ray of the left knee for further evaluation.  He will remain on allopurinol as prescribed.  Uric acid, CBC, and CMP will be checked today.  He was advised to notify us if his symptoms persist or worsen.  Plan: Uric acid, Colchicine 0.6 MG CAPS  Medication monitoring encounter - Allopurinol 300 mg by mouth daily.  Uric acid WNL-5.9 on 07/10/22.  Uric acid updated today. CBC and CMP updated today.   - Plan: CBC with Differential/Platelet, COMPLETE METABOLIC PANEL WITH GFR, Uric acid  Chronic pain of left knee - Patient presents today with increased pain and inflammation in the left knee.  He had a fall a few weeks ago and drank alcohol recently, which could have contributed to his symptoms.  X-rays of the left knee updated today. We will call the patient with X-ray results.  He has been taking prednisone 5 mg 1 tablet daily for  the last 5 days.  He ran out of a prescription for colchicine.  He has been taking  allopurinol as prescribed. Patient declined a cortisone injection at this time.  A prednisone taper starting at 20 mg tapering by 5 mg every 4 days.   A refill of colchicine was also sent to the pharmacy.  He was advised to notify us if his symptoms persist or worsen. Plan: XR KNEE 3 VIEW LEFT  Primary osteoarthritis of both hands: No inflammation noted.   Pain in right hip: Good ROM with no groin pain.   Primary osteoarthritis of both feet: Not currently symptomatic   DDD (degenerative disc disease), lumbar: He is not experiencing any increased discomfort in his lower back.    Other medical conditions are listed as follows:  Neuropathy: He has a prescription for gabapentin but has not been taking it consistently.  Encourage patient to follow-up with neurology if his symptoms of neuropathy have been persistent or worsening.  CKD (chronic kidney disease) stage 2, GFR 60-89 ml/min: CMP with GFR updated today.   Atherosclerosis of coronary artery bypass graft of native heart without angina pectoris  Chronic diastolic CHF (congestive heart failure) (HCC)  History of atrial fibrillation  History of hyperlipidemia  Onychomycosis  Other insomnia    Orders: Orders Placed This Encounter  Procedures   XR KNEE 3 VIEW LEFT   CBC with Differential/Platelet   COMPLETE METABOLIC PANEL WITH GFR   Uric acid   Meds ordered this encounter  Medications   Colchicine 0.6 MG CAPS    Sig: Take 1 tablet by mouth daily as needed during gout flares.    Dispense:  90 capsule    Refill:  0   predniSONE (DELTASONE) 5 MG tablet    Sig: Take 4 tabs po x 4 days, 3  tabs po x 4 days, 2  tabs po x 4 days, 1  tab po x 4 days    Dispense:  40 tablet    Refill:  0     Follow-Up Instructions: Return in about 6 months (around 07/30/2023) for Gout.   Gearldine Bienenstock, PA-C  Note - This record has been created using Dragon software.  Chart creation errors have been sought, but may not always  have  been located. Such creation errors do not reflect on  the standard of medical care.

## 2023-01-28 ENCOUNTER — Ambulatory Visit: Payer: Medicare PPO | Attending: Physician Assistant | Admitting: Physician Assistant

## 2023-01-28 ENCOUNTER — Encounter: Payer: Self-pay | Admitting: Physician Assistant

## 2023-01-28 ENCOUNTER — Ambulatory Visit (INDEPENDENT_AMBULATORY_CARE_PROVIDER_SITE_OTHER): Payer: Medicare PPO

## 2023-01-28 VITALS — BP 137/78 | HR 52 | Resp 17 | Ht 67.0 in | Wt 182.8 lb

## 2023-01-28 DIAGNOSIS — M25551 Pain in right hip: Secondary | ICD-10-CM | POA: Diagnosis not present

## 2023-01-28 DIAGNOSIS — M19042 Primary osteoarthritis, left hand: Secondary | ICD-10-CM

## 2023-01-28 DIAGNOSIS — M19072 Primary osteoarthritis, left ankle and foot: Secondary | ICD-10-CM

## 2023-01-28 DIAGNOSIS — Z5181 Encounter for therapeutic drug level monitoring: Secondary | ICD-10-CM

## 2023-01-28 DIAGNOSIS — Z8679 Personal history of other diseases of the circulatory system: Secondary | ICD-10-CM

## 2023-01-28 DIAGNOSIS — G8929 Other chronic pain: Secondary | ICD-10-CM | POA: Diagnosis not present

## 2023-01-28 DIAGNOSIS — M25562 Pain in left knee: Secondary | ICD-10-CM | POA: Diagnosis not present

## 2023-01-28 DIAGNOSIS — Z8639 Personal history of other endocrine, nutritional and metabolic disease: Secondary | ICD-10-CM

## 2023-01-28 DIAGNOSIS — G4709 Other insomnia: Secondary | ICD-10-CM

## 2023-01-28 DIAGNOSIS — M5136 Other intervertebral disc degeneration, lumbar region: Secondary | ICD-10-CM | POA: Diagnosis not present

## 2023-01-28 DIAGNOSIS — I25810 Atherosclerosis of coronary artery bypass graft(s) without angina pectoris: Secondary | ICD-10-CM

## 2023-01-28 DIAGNOSIS — N182 Chronic kidney disease, stage 2 (mild): Secondary | ICD-10-CM | POA: Diagnosis not present

## 2023-01-28 DIAGNOSIS — B351 Tinea unguium: Secondary | ICD-10-CM

## 2023-01-28 DIAGNOSIS — G629 Polyneuropathy, unspecified: Secondary | ICD-10-CM | POA: Diagnosis not present

## 2023-01-28 DIAGNOSIS — I5032 Chronic diastolic (congestive) heart failure: Secondary | ICD-10-CM

## 2023-01-28 DIAGNOSIS — M1A09X0 Idiopathic chronic gout, multiple sites, without tophus (tophi): Secondary | ICD-10-CM | POA: Diagnosis not present

## 2023-01-28 DIAGNOSIS — M19071 Primary osteoarthritis, right ankle and foot: Secondary | ICD-10-CM | POA: Diagnosis not present

## 2023-01-28 DIAGNOSIS — M19041 Primary osteoarthritis, right hand: Secondary | ICD-10-CM

## 2023-01-28 LAB — COMPLETE METABOLIC PANEL WITH GFR: Calcium: 9.7 mg/dL (ref 8.6–10.3)

## 2023-01-28 MED ORDER — PREDNISONE 5 MG PO TABS: ORAL_TABLET | ORAL | 0 refills | Status: DC

## 2023-01-28 MED ORDER — COLCHICINE 0.6 MG PO CAPS: ORAL_CAPSULE | ORAL | 0 refills | Status: DC

## 2023-01-28 NOTE — Patient Instructions (Signed)

## 2023-01-29 LAB — COMPLETE METABOLIC PANEL WITH GFR
AG Ratio: 1.9 (calc) (ref 1.0–2.5)
ALT: 13 U/L (ref 9–46)
AST: 16 U/L (ref 10–35)
Albumin: 4.7 g/dL (ref 3.6–5.1)
Alkaline phosphatase (APISO): 53 U/L (ref 35–144)
BUN/Creatinine Ratio: 11 (calc) (ref 6–22)
BUN: 17 mg/dL (ref 7–25)
CO2: 33 mmol/L — ABNORMAL HIGH (ref 20–32)
Chloride: 100 mmol/L (ref 98–110)
Creat: 1.55 mg/dL — ABNORMAL HIGH (ref 0.70–1.35)
Globulin: 2.5 g/dL (calc) (ref 1.9–3.7)
Glucose, Bld: 106 mg/dL — ABNORMAL HIGH (ref 65–99)
Potassium: 5 mmol/L (ref 3.5–5.3)
Sodium: 139 mmol/L (ref 135–146)
Total Bilirubin: 0.5 mg/dL (ref 0.2–1.2)
Total Protein: 7.2 g/dL (ref 6.1–8.1)
eGFR: 49 mL/min/{1.73_m2} — ABNORMAL LOW (ref >=60)

## 2023-01-29 LAB — CBC WITH DIFFERENTIAL/PLATELET
Absolute Monocytes: 1606 cells/uL — ABNORMAL HIGH (ref 200–950)
Basophils Absolute: 38 cells/uL (ref 0–200)
Basophils Relative: 0.4 %
Eosinophils Absolute: 247 cells/uL (ref 15–500)
Eosinophils Relative: 2.6 %
HCT: 45.8 % (ref 38.5–50.0)
Hemoglobin: 15 g/dL (ref 13.2–17.1)
Lymphs Abs: 2024 cells/uL (ref 850–3900)
MCH: 31.4 pg (ref 27.0–33.0)
MCHC: 32.8 g/dL (ref 32.0–36.0)
MCV: 96 fL (ref 80.0–100.0)
MPV: 10.7 fL (ref 7.5–12.5)
Monocytes Relative: 16.9 %
Neutro Abs: 5586 cells/uL (ref 1500–7800)
Neutrophils Relative %: 58.8 %
Platelets: 225 10*3/uL (ref 140–400)
RBC: 4.77 10*6/uL (ref 4.20–5.80)
RDW: 12.3 % (ref 11.0–15.0)
Total Lymphocyte: 21.3 %
WBC: 9.5 10*3/uL (ref 3.8–10.8)

## 2023-01-29 LAB — URIC ACID: Uric Acid, Serum: 5.7 mg/dL (ref 4.0–8.0)

## 2023-01-29 NOTE — Progress Notes (Signed)
Uric acid is WNL. Continue current dose of allopurinol.  CBC stable. Creatinine remains elevated and GFR is low-49. Avoid the use of NSAIDs.     Dr. Corliss Skains will review XR of knee when she returns to the office.

## 2023-02-03 ENCOUNTER — Telehealth: Payer: Medicare PPO | Admitting: Family

## 2023-02-03 DIAGNOSIS — A6000 Herpesviral infection of urogenital system, unspecified: Secondary | ICD-10-CM

## 2023-02-03 MED ORDER — VALACYCLOVIR HCL 500 MG PO TABS: 500.0000 mg | ORAL_TABLET | Freq: Two times a day (BID) | ORAL | 0 refills | Status: DC

## 2023-02-03 NOTE — Progress Notes (Signed)
E-Visit for Herpes Simplex  We are sorry that you are not feeling well.  Here is how we plan to help!  Based on what you have shared ith me, it looks like you may be having an outbreak/flare-up of genital herpes.    I have prescribed I have prescribed Valacyclovir 500 mg Take one by mouth twice a day for 7 days.    If you have been prescribed long term medications to be taken on a regular basis, it is important to follow the recommendations and take them as ordered.    Outbreaks usually include blisters and open sores in the genital area. Outbreaks that happen after the first time are usually not as severe and do not last as long. Genital Herpes Simplex is a commonly sexually transmitted viral infection that is found worldwide. Most of these genital infections are caused by one or two herpes simplex viruses that is passed from person to person during vaginal, oral, or anal sex. Sometimes, people do not know they have herpes because they do not have any symptoms.  Please be aware that if you have genital herpes you can be contagious even when you are not having rash or flare-up and you may not have any symptoms, even when you are taking suppressive medicines.  Herpes cannot be cured. The disease usually causes most problems during the first few years. After that, the virus is still there, but it causes few to no symptoms. Even when the virus is active, people with herpes can take medicines to reduce and help prevent symptoms.  Herpes is an infection that can cause blisters and open sores on the genital area. Herpes is caused by a virus that is passed from person to person during vaginal, oral, or anal sex. Sometimes, people do not know they have herpes because they do not have any symptoms. Herpes cannot be cured. The disease usually causes most problems during the first few years. After that, the virus is still there, but it causes few to no symptoms. Even when the virus is active, people with herpes  can take medicines to reduce and help prevent symptoms.  If you have been prescribed medications to be taken on a regular basis, it is important to follow the recommendations and take them as ordered.  Some people with herpes never have any symptoms. But other people can develop symptoms within a few weeks of being infected with the herpes virus   Symptoms usually include blisters in the genital area. In women, this area includes the vagina, buttocks, anus, or thighs. In men, this area includes the penis, scrotum, anus, butt, or thighs. The blisters can become painful open sores, which then crust over as they heal. Sometimes, people can have other symptoms that include:  ?Blisters on the mouth or lips ?Fever, headache, or pain in the joints ?Trouble urinating  Outbreaks might occur every month or more often, or just once or twice a year. Sometimes, people can tell when an outbreak will occur, because they feel itching or pain beforehand. Sometimes they do not know that an outbreak is coming because they have no symptoms. Whatever your pattern is, keep in mind that herpes outbreaks usually become less frequent over time as you get older. Certain things, called "triggers," can make outbreaks more likely to occur. These include stress, sunlight, menstrual periods,or getting sick.  Antiviral therapy can shorten the duration of symptoms and signs in primary infection, which, when untreated, can be associated with significant increase in the  symptoms of the disease.  HOME CARE Use a portable bath (such as a "Sitz bath") where you can sit in warm water for about 20 minutes. Your bathtub could also work. Avoid bubble baths.  Keep the genital area clean and dry and avoid tight clothes.  Take over-the-counter pain medicine such as acetaminophen (brand name: Tylenol) or ibuprofen sample brand names: Advil, Motrin). But avoid aspirin.  Only take medications as instructed by your medical team.  You are  most likely to spread herpes to a sex partner when you have blisters and open sores on your body. But it's also possible to spread herpes to your partner when you do not have any symptoms. That is because herpes can be present on your body without causing any symptoms, like blisters or pain.  Telling your sex partner that you have herpes can be hard. But it can help protect them, since there are ways to lower the risk of spreading the infection.   Using a condom every time you have sex  Not having sex when you have symptoms  Not having oral sex if you have blisters or open sores (in the genital area or around your mouth)  MAKE SURE YOU   Understand these instructions. Do not have sex without using a condom until you have been seen by a doctor and as instructed by the provider If you are not better or improved within 7 days, you MUST have a follow up at your doctor or the health department for evaluation. There are other causes of rashes in the genital region.  Thank you for choosing an e-visit.  Your e-visit answers were reviewed by a board certified advanced clinical practitioner to complete your personal care plan. Depending upon the condition, your plan could have included both over the counter or prescription medications.  Please review your pharmacy choice. Make sure the pharmacy is open so you can pick up prescription now. If there is a problem, you may contact your provider through [employer] and have the prescription routed to another pharmacy.  Your safety is important to [place]. If you have drug allergies check your prescription carefully.   For the next 24 hours you can use MyChart to ask questions about today's visit, request a non-urgent call back, or ask for a work or school excuse. You will get an email in the next two days asking about your experience. I hope that your e-visit has been valuable and will speed your recovery.    Approximately 5 minutes was spent  documenting and reviewing patient's chart.

## 2023-02-06 NOTE — Progress Notes (Signed)
X-rays of the left knee are consistent with mild osteoarthritis and mild chondromalacia patella.  Please notify the patient.  Please clarify if the cortisone injection has alleviated his symptoms

## 2023-02-11 ENCOUNTER — Other Ambulatory Visit: Payer: Self-pay | Admitting: Cardiovascular Disease

## 2023-02-11 MED ORDER — FUROSEMIDE 20 MG PO TABS: 20.0000 mg | ORAL_TABLET | ORAL | 3 refills | Status: DC

## 2023-02-12 ENCOUNTER — Ambulatory Visit: Payer: Medicare PPO | Attending: Physician Assistant | Admitting: Rheumatology

## 2023-02-12 VITALS — BP 120/66 | HR 61

## 2023-02-12 DIAGNOSIS — G8929 Other chronic pain: Secondary | ICD-10-CM | POA: Diagnosis not present

## 2023-02-12 DIAGNOSIS — M25562 Pain in left knee: Secondary | ICD-10-CM

## 2023-02-12 DIAGNOSIS — M1A09X0 Idiopathic chronic gout, multiple sites, without tophus (tophi): Secondary | ICD-10-CM

## 2023-02-12 MED ORDER — TRIAMCINOLONE ACETONIDE 40 MG/ML IJ SUSP
40.0000 mg | INTRAMUSCULAR | Status: AC | PRN
Start: 2023-02-12 — End: 2023-02-12
  Administered 2023-02-12: 40 mg via INTRA_ARTICULAR

## 2023-02-12 MED ORDER — LIDOCAINE HCL 1 % IJ SOLN
1.5000 mL | INTRAMUSCULAR | Status: AC | PRN
Start: 2023-02-12 — End: 2023-02-12
  Administered 2023-02-12: 1.5 mL

## 2023-02-12 NOTE — Telephone Encounter (Signed)
Ok to provide a dose of valium to take prior to the injection but he will require a driver to and from the appointment. Please clarify if he is agreeable to this?

## 2023-02-12 NOTE — Telephone Encounter (Signed)
I called patient, appt 02/12/2023 w/o valium

## 2023-02-12 NOTE — Progress Notes (Signed)
   Procedure Note  Patient: Jeremy Hendricks             Date of Birth: Jul 14, 1956           MRN: [id number]             Visit Date: 02/12/2023  Procedures: Visit Diagnoses:  1. Idiopathic chronic gout of multiple sites without tophus   2. Chronic pain of left knee    Patient returns today to get cortisone injection in the left knee due to ongoing pain and discomfort.  After informed consent was obtained and side effects were discussed left knee joint was injected with lidocaine and cortisone as described below.  Large Joint Inj on 02/12/2023 3:43 PM Indications: pain Details: 27 G 1.5 in needle, medial approach  Arthrogram: No  Medications: 40 mg triamcinolone acetonide 40 MG/ML; 1.5 mL lidocaine 1 % Aspirate: 0 mL Outcome: tolerated well, no immediate complications Procedure, treatment alternatives, risks and benefits explained, specific risks discussed. Consent was given by the patient. Immediately prior to procedure a time out was called to verify the correct patient, procedure, equipment, support staff and site/side marked as required. Patient was prepped and draped in the usual sterile fashion.     Postprocedure instructions were given. Pollyann Savoy, MD

## 2023-02-21 DIAGNOSIS — M4607 Spinal enthesopathy, lumbosacral region: Secondary | ICD-10-CM | POA: Diagnosis not present

## 2023-02-21 DIAGNOSIS — G629 Polyneuropathy, unspecified: Secondary | ICD-10-CM | POA: Diagnosis not present

## 2023-02-21 DIAGNOSIS — M9905 Segmental and somatic dysfunction of pelvic region: Secondary | ICD-10-CM | POA: Diagnosis not present

## 2023-02-21 DIAGNOSIS — M9906 Segmental and somatic dysfunction of lower extremity: Secondary | ICD-10-CM | POA: Diagnosis not present

## 2023-03-01 ENCOUNTER — Other Ambulatory Visit: Payer: Self-pay | Admitting: Cardiovascular Disease

## 2023-03-01 ENCOUNTER — Other Ambulatory Visit: Payer: Self-pay | Admitting: Rheumatology

## 2023-03-01 DIAGNOSIS — M9905 Segmental and somatic dysfunction of pelvic region: Secondary | ICD-10-CM | POA: Diagnosis not present

## 2023-03-01 DIAGNOSIS — M9906 Segmental and somatic dysfunction of lower extremity: Secondary | ICD-10-CM | POA: Diagnosis not present

## 2023-03-01 DIAGNOSIS — M4607 Spinal enthesopathy, lumbosacral region: Secondary | ICD-10-CM | POA: Diagnosis not present

## 2023-03-01 DIAGNOSIS — G629 Polyneuropathy, unspecified: Secondary | ICD-10-CM | POA: Diagnosis not present

## 2023-03-01 NOTE — Telephone Encounter (Signed)
Last Fill: 11/26/2022  Labs: 01/25/2023 Uric acid is WNL. Continue current dose of allopurinol. CBC stable. Creatinine remains elevated and GFR is low-49.  Next Visit: Due December 2024. Message sent to the front to schedule.   Last Visit: 01/28/2023  DX:  Idiopathic chronic gout of multiple sites without tophus   Current Dose per office note 01/28/2023: Allopurinol 300 mg by mouth daily   Okay to refill Allopurinol?

## 2023-03-01 NOTE — Telephone Encounter (Signed)
Please schedule patient a follow up visit. Patient due December 2024. Thanks!   Follow-Up Instructions: Return in about 6 months (around 07/30/2023) for Gout.

## 2023-03-14 ENCOUNTER — Encounter: Payer: Self-pay | Admitting: Family Medicine

## 2023-03-14 NOTE — Telephone Encounter (Signed)
Pt is asking to be referred for behavioral health, do you know of anywhere that may be scheduling sooner than 2 months?  Also sent to [facility] to see if she may be aware of locations

## 2023-03-22 DIAGNOSIS — F4323 Adjustment disorder with mixed anxiety and depressed mood: Secondary | ICD-10-CM | POA: Diagnosis not present

## 2023-04-01 ENCOUNTER — Encounter: Payer: Self-pay | Admitting: Family Medicine

## 2023-04-01 ENCOUNTER — Ambulatory Visit: Payer: Medicare PPO | Admitting: Family Medicine

## 2023-04-01 ENCOUNTER — Other Ambulatory Visit (HOSPITAL_COMMUNITY)
Admission: RE | Admit: 2023-04-01 | Discharge: 2023-04-01 | Disposition: A | Discharge status: Home / Self Care | Payer: Medicare PPO | Source: Ambulatory Visit | Attending: Family Medicine | Admitting: Family Medicine

## 2023-04-01 VITALS — BP 133/77 | HR 60 | Temp 98.0°F | Wt 182.8 lb

## 2023-04-01 DIAGNOSIS — R599 Enlarged lymph nodes, unspecified: Secondary | ICD-10-CM | POA: Diagnosis not present

## 2023-04-01 DIAGNOSIS — Z7251 High risk heterosexual behavior: Secondary | ICD-10-CM | POA: Diagnosis not present

## 2023-04-01 DIAGNOSIS — G4489 Other headache syndrome: Secondary | ICD-10-CM | POA: Diagnosis not present

## 2023-04-01 DIAGNOSIS — N1831 Chronic kidney disease, stage 3a: Secondary | ICD-10-CM | POA: Diagnosis not present

## 2023-04-01 DIAGNOSIS — F4323 Adjustment disorder with mixed anxiety and depressed mood: Secondary | ICD-10-CM | POA: Diagnosis not present

## 2023-04-01 LAB — POC COVID19 BINAXNOW: SARS Coronavirus 2 Ag: NEGATIVE

## 2023-04-01 NOTE — Patient Instructions (Addendum)
Return in about 2 weeks (around 04/15/2023), or if symptoms worsen or fail to improve, for w/ PCP.  Your covid is negative.         Great to see you today.   If labs were collected or images ordered, we will inform you of  results once we have received them and reviewed. We will contact you either by echart message, or telephone call.  Please give ample time to the testing facility, and our office to run,  receive and review results. Please do not call inquiring of results, even if you can see them in your chart. We will contact you as soon as we are able. If it has been over 1 week since the test was completed, and you have not yet heard from [place], then please call us.    - echart message- for normal results that have been seen by the patient already.   - telephone call: abnormal results or if patient has not viewed results in their echart.  If a referral to a specialist was entered for you, please call us in 2 weeks if you have not heard from the specialist office to schedule.

## 2023-04-01 NOTE — Progress Notes (Signed)
Jeremy Hendricks , 17-Aug-1955, 67 y.o., male MRN: [id number] Patient Care Team    Relationship Specialty Notifications Start End  Shade Flood, MD PCP - General Family Medicine  02/02/16   Kathleene Hazel, MD PCP - Cardiology Cardiology Admissions 03/11/18     Chief Complaint  Patient presents with   Headache    W/ swollen glands x3 days. Denies any other symptoms and has not been tested for Covid.     Subjective: Jeremy Hendricks is a 67 y.o. Pt presents for an OV with complaints of swollen glands (points to posterior neck/occipital region)and headache of 3 days duration. He denies any other symptoms today. He is eating and drinking well. He states he has been noticing some sweating at night last few nights.   He endorses engaging in unprotected sex a little over a week ago and is concerned symptoms may be related. He would like STD panel collected.  Gfr 49- prescribed truvada     11/22/2022    8:44 AM 07/30/2022    2:45 PM 07/09/2022    8:18 AM 08/29/2021   11:39 AM 06/22/2021    1:20 PM  Depression screen PHQ 2/9  Decreased Interest 0 0 0 0 0  Down, Depressed, Hopeless 0 0 0  0  PHQ - 2 Score 0 0 0 0 0  Altered sleeping 1 0 0    Tired, decreased energy 0 0 1    Change in appetite 0 0 0    Feeling bad or failure about yourself  0 0 0    Trouble concentrating 0 0 0    Moving slowly or fidgety/restless 0 0 0    Suicidal thoughts 0 0 0    PHQ-9 Score 1 0 1    Difficult doing work/chores Not difficult at all        No Known Allergies Social History   Social History Narrative   Single   Education: Automotive engineer   Exercise: Yes   Past Medical History:  Diagnosis Date   Atrial fibrillation (HCC)    postoperative   Chronic diastolic CHF (congestive heart failure) (HCC)    CKD (chronic kidney disease), stage II    Coronary artery disease    a. s/p CABG 2002. b. s/p redo 2012.   DJD (degenerative joint disease)    Gout    History of ETT    a. ETT 6/16:   normal   Past Surgical History:  Procedure Laterality Date   CORONARY ARTERY BYPASS GRAFT  2002   CORONARY ARTERY BYPASS GRAFT  08-2010   L-LAD remained from original CABG; new grafts incl L radial- PDA + RIMA-RI   VASECTOMY     Family History  Problem Relation Age of Onset   Heart attack Father 55   Hypertension Father    Cancer Mother        Breast cancer   Cancer Other        family hx of   Coronary artery disease Other        family hx of   Hyperlipidemia Other        family hx of   Hypertension Paternal Grandfather    Healthy Son    Healthy Daughter    Stroke Neg Hx    Colon cancer Neg Hx    Allergies as of 04/01/2023   No Known Allergies      Medication List        Accurate as  of April 01, 2023  3:14 PM. If you have any questions, ask your nurse or doctor.          STOP taking these medications    doxycycline 100 MG capsule Commonly known as: VIBRAMYCIN Stopped by: Felix Pacini   gabapentin 100 MG capsule Commonly known as: NEURONTIN Stopped by: Felix Pacini   predniSONE 5 MG tablet Commonly known as: DELTASONE Stopped by: Felix Pacini   terbinafine 250 MG tablet Commonly known as: LAMISIL Stopped by: Felix Pacini   valACYclovir 500 MG tablet Commonly known as: Valtrex Stopped by: Felix Pacini       TAKE these medications    albuterol 108 (90 Base) MCG/ACT inhaler Commonly known as: VENTOLIN HFA Inhale 2 puffs into the lungs every 6 (six) hours as needed for wheezing or shortness of breath.   allopurinol 300 MG tablet Commonly known as: ZYLOPRIM TAKE 1 TABLET BY MOUTH EVERY DAY   atenolol 25 MG tablet Commonly known as: TENORMIN TAKE 1 TABLET BY MOUTH EVERY DAY   Colchicine 0.6 MG Caps Take 1 tablet by mouth daily as needed during gout flares.   emtricitabine-tenofovir 200-300 MG tablet Commonly known as: TRUVADA TAKE ONE TABLET BY MOUTH ONCE DAILY WITH OR WITHOUT FOOD. STORE IN ORIGINAL CONTAINER AT ROOM TEMPERATURE.    furosemide 20 MG tablet Commonly known as: LASIX Take 1 tablet (20 mg total) by mouth every other day.   multivitamin tablet Take 1 tablet by mouth daily.   rosuvastatin 40 MG tablet Commonly known as: CRESTOR TAKE 1 TABLET BY MOUTH EVERY DAY   Testosterone 30 MG/ACT Soln APPLY 1 PUMP EVERY DAY        All past medical history, surgical history, allergies, family history, immunizations andmedications were updated in the EMR today and reviewed under the history and medication portions of their EMR.     Review of Systems  Constitutional:  Negative for chills, fever and malaise/fatigue.  HENT:  Positive for sore throat (swollen glands). Negative for congestion, ear pain and sinus pain.   Eyes:  Negative for pain and discharge.  Respiratory:  Negative for cough, sputum production, shortness of breath and wheezing.   Cardiovascular:  Negative for chest pain.  Gastrointestinal:  Negative for diarrhea, nausea and vomiting.  Musculoskeletal:  Negative for myalgias.  Neurological:  Positive for headaches. Negative for dizziness.   Negative, with the exception of above mentioned in HPI   Objective:  BP 133/77   Pulse 60   Temp 98 F (36.7 C) (Oral)   Wt 182 lb 12.8 oz (82.9 kg)   SpO2 96%   BMI 28.63 kg/m  Body mass index is 28.63 kg/m. Physical Exam Vitals and nursing note reviewed.  Constitutional:      General: He is not in acute distress.    Appearance: Normal appearance. He is not ill-appearing, toxic-appearing or diaphoretic.  HENT:     Head: Normocephalic and atraumatic.     Ears:     Comments: Hearing aids in place    Nose: Rhinorrhea present. No congestion.     Mouth/Throat:     Mouth: Mucous membranes are moist.     Pharynx: No oropharyngeal exudate or posterior oropharyngeal erythema.  Eyes:     General: No scleral icterus.       Right eye: No discharge.        Left eye: No discharge.     Extraocular Movements: Extraocular movements intact.     Pupils:  Pupils are equal, round, and  reactive to light.  Cardiovascular:     Rate and Rhythm: Normal rate and regular rhythm.  Pulmonary:     Effort: Pulmonary effort is normal. No respiratory distress.     Breath sounds: Normal breath sounds. No wheezing, rhonchi or rales.  Musculoskeletal:     Cervical back: Neck supple. Tenderness (right occiptal tuberance- mild TTP) present. No rigidity.  Lymphadenopathy:     Cervical: No cervical adenopathy.  Skin:    General: Skin is warm and dry.     Coloration: Skin is not jaundiced or pale.     Findings: No rash.     Comments: No scalp lesions.   Neurological:     Mental Status: He is alert and oriented to person, place, and time. Mental status is at baseline.  Psychiatric:        Mood and Affect: Mood normal.        Behavior: Behavior normal.        Thought Content: Thought content normal.        Judgment: Judgment normal.    No results found. No results found. Results for orders placed or performed in visit on 04/01/23 (from the past 24 hour(s))  POC COVID-19 BinaxNow     Status: None   Collection Time: 04/01/23  3:03 PM  Result Value Ref Range   SARS Coronavirus 2 Ag Negative Negative    Assessment/Plan: Jeremy Hendricks is a 67 y.o. male present for OV for  CKD stage 3a, GFR 45-59 ml/min (HCC) - Comp Met (CMET) Unprotected sexual intercourse/ swollen glands/headache - covid negative, recent treated genital HSV - CBC w/Diff - Comp Met (CMET) - Hepatitis C Antibody - RPR - HIV antibody (with reflex) - Urine cytology ancillary only([facility]) F/u pcp within 2 weeks if symptoms do not resolve  Reviewed expectations re: course of current medical issues. Discussed self-management of symptoms. Outlined signs and symptoms indicating need for more acute intervention. Patient verbalized understanding and all questions were answered. Patient received an After-Visit Summary.    Orders Placed This Encounter  Procedures   CBC w/Diff    Comp Met (CMET)   Hepatitis C Antibody   RPR   HIV antibody (with reflex)   POC COVID-19 BinaxNow   No orders of the defined types were placed in this encounter.  Referral Orders  No referral(s) requested today     Note is dictated utilizing voice recognition software. Although note has been proof read prior to signing, occasional typographical errors still can be missed. If any questions arise, please do not hesitate to call for verification.   electronically signed by:  Felix Pacini, DO  [facility] [place]

## 2023-04-02 ENCOUNTER — Encounter: Payer: Self-pay | Admitting: Family Medicine

## 2023-04-02 LAB — CBC WITH DIFFERENTIAL/PLATELET
Basophils Absolute: 0.1 10*3/uL (ref 0.0–0.1)
Basophils Relative: 1.1 % (ref 0.0–3.0)
Eosinophils Absolute: 0.2 10*3/uL (ref 0.0–0.7)
Eosinophils Relative: 2.2 % (ref 0.0–5.0)
HCT: 45.1 % (ref 39.0–52.0)
Hemoglobin: 14.9 g/dL (ref 13.0–17.0)
Lymphocytes Relative: 19 % (ref 12.0–46.0)
Lymphs Abs: 1.9 10*3/uL (ref 0.7–4.0)
MCHC: 33 g/dL (ref 30.0–36.0)
MCV: 97.7 fl (ref 78.0–100.0)
Monocytes Absolute: 1.5 10*3/uL — ABNORMAL HIGH (ref 0.1–1.0)
Monocytes Relative: 15.3 % — ABNORMAL HIGH (ref 3.0–12.0)
Neutro Abs: 6.2 10*3/uL (ref 1.4–7.7)
Neutrophils Relative %: 62.4 % (ref 43.0–77.0)
Platelets: 217 10*3/uL (ref 150.0–400.0)
RBC: 4.62 Mil/uL (ref 4.22–5.81)
RDW: 13 % (ref 11.5–15.5)
WBC: 10 10*3/uL (ref 4.0–10.5)

## 2023-04-02 LAB — COMPREHENSIVE METABOLIC PANEL
ALT: 15 U/L (ref 0–53)
AST: 18 U/L (ref 0–37)
Albumin: 4.5 g/dL (ref 3.5–5.2)
Alkaline Phosphatase: 58 U/L (ref 39–117)
BUN: 20 mg/dL (ref 6–23)
CO2: 32 meq/L (ref 19–32)
Calcium: 9.6 mg/dL (ref 8.4–10.5)
Chloride: 100 meq/L (ref 96–112)
Creatinine, Ser: 1.39 mg/dL (ref 0.40–1.50)
GFR: 52.89 mL/min — ABNORMAL LOW (ref >60.00)
Glucose, Bld: 97 mg/dL (ref 70–99)
Potassium: 4.8 meq/L (ref 3.5–5.1)
Sodium: 140 meq/L (ref 135–145)
Total Bilirubin: 0.5 mg/dL (ref 0.2–1.2)
Total Protein: 7.5 g/dL (ref 6.0–8.3)

## 2023-04-02 LAB — HEPATITIS C ANTIBODY: Hepatitis C Ab: NONREACTIVE

## 2023-04-02 LAB — HIV ANTIBODY (ROUTINE TESTING W REFLEX): HIV 1&2 Ab, 4th Generation: NONREACTIVE

## 2023-04-02 LAB — RPR: RPR Ser Ql: NONREACTIVE

## 2023-04-02 NOTE — Telephone Encounter (Signed)
Pt has questions about a low dose prozac and if this would be effected by his other meds   Please advise

## 2023-04-03 LAB — URINE CYTOLOGY ANCILLARY ONLY
Chlamydia: NEGATIVE
Comment: NEGATIVE
Comment: NORMAL
Neisseria Gonorrhea: NEGATIVE

## 2023-04-03 NOTE — Telephone Encounter (Signed)
This may be best discussed with a visit.  Virtual visit is fine if that works for him.  We can discuss that medication, his other meds, and this is something I can prescribe if needed.

## 2023-04-09 ENCOUNTER — Ambulatory Visit (INDEPENDENT_AMBULATORY_CARE_PROVIDER_SITE_OTHER): Payer: Medicare PPO

## 2023-04-09 DIAGNOSIS — Z23 Encounter for immunization: Secondary | ICD-10-CM | POA: Diagnosis not present

## 2023-04-11 DIAGNOSIS — F4322 Adjustment disorder with anxiety: Secondary | ICD-10-CM | POA: Diagnosis not present

## 2023-04-20 ENCOUNTER — Other Ambulatory Visit: Payer: Self-pay | Admitting: Physician Assistant

## 2023-04-20 DIAGNOSIS — M1A09X0 Idiopathic chronic gout, multiple sites, without tophus (tophi): Secondary | ICD-10-CM

## 2023-04-22 NOTE — Telephone Encounter (Signed)
Last Fill: 01/28/2023  Labs: 04/01/2023 GFR 52.89, Monocytes Relative 15.3, MOnocytes Absolute 1.5  Next Visit: 07/30/2023  Last Visit: 01/28/2023  DX:  Idiopathic chronic gout of multiple sites without tophus   Current Dose per office note 01/28/2023: not discusssed  Okay to refill Colchicine?

## 2023-04-24 ENCOUNTER — Telehealth: Payer: Medicare PPO | Admitting: Physician Assistant

## 2023-04-24 DIAGNOSIS — T148XXA Other injury of unspecified body region, initial encounter: Secondary | ICD-10-CM

## 2023-04-24 DIAGNOSIS — L089 Local infection of the skin and subcutaneous tissue, unspecified: Secondary | ICD-10-CM | POA: Diagnosis not present

## 2023-04-24 MED ORDER — SULFAMETHOXAZOLE-TRIMETHOPRIM 800-160 MG PO TABS
1.0000 | ORAL_TABLET | Freq: Two times a day (BID) | ORAL | 0 refills | Status: DC
Start: 2023-04-24 — End: 2023-05-06

## 2023-04-24 NOTE — Progress Notes (Signed)
E Visit for Cellulitis  We are sorry that you are not feeling well. Here is how we plan to help!  Based on what you shared with me it looks like you have an infected wound of the leg. Please keep the area clean and dry. I have prescribed:  Bactrim DS 1 tablet by mouth twice a day for 7 days. I want you to schedule a follow-up within 10 days with your PCP for reassessment of the area. If no substantial improvement within 48-72 hours, any worsening symptoms, or physically unable to move the ankle, you need an in person evaluation ASAP.   HOME CARE:  Take your medications as ordered and take all of them, even if the skin irritation appears to be healing.   GET HELP RIGHT AWAY IF:  Symptoms that don't begin to go away within 48 hours. Severe redness persists or worsens If the area turns color, spreads or swells. If it blisters and opens, develops yellow-brown crust or bleeds. You develop a fever or chills. If the pain increases or becomes unbearable.  Are unable to keep fluids and food down.  MAKE SURE YOU   Understand these instructions. Will watch your condition. Will get help right away if you are not doing well or get worse.  Thank you for choosing an e-visit.  Your e-visit answers were reviewed by a board certified advanced clinical practitioner to complete your personal care plan. Depending upon the condition, your plan could have included both over the counter or prescription medications.  Please review your pharmacy choice. Make sure the pharmacy is open so you can pick up prescription now. If there is a problem, you may contact your provider through [employer] and have the prescription routed to another pharmacy.  Your safety is important to [place]. If you have drug allergies check your prescription carefully.   For the next 24 hours you can use MyChart to ask questions about today's visit, request a non-urgent call back, or ask for a work or school excuse. You will get an  email in the next two days asking about your experience. I hope that your e-visit has been valuable and will speed your recovery.

## 2023-04-24 NOTE — Progress Notes (Signed)
I have spent 5 minutes in review of e-visit questionnaire, review and updating patient chart, medical decision making and response to patient.   William Cody Martin, PA-C    

## 2023-05-01 DIAGNOSIS — F4323 Adjustment disorder with mixed anxiety and depressed mood: Secondary | ICD-10-CM | POA: Diagnosis not present

## 2023-05-06 ENCOUNTER — Other Ambulatory Visit: Payer: Self-pay | Admitting: Family Medicine

## 2023-05-06 ENCOUNTER — Encounter: Payer: Self-pay | Admitting: Family Medicine

## 2023-05-06 ENCOUNTER — Ambulatory Visit: Payer: Medicare PPO | Admitting: Family Medicine

## 2023-05-06 VITALS — BP 110/70 | HR 56 | Temp 98.6°F | Wt 180.8 lb

## 2023-05-06 DIAGNOSIS — Z1331 Encounter for screening for depression: Secondary | ICD-10-CM | POA: Diagnosis not present

## 2023-05-06 DIAGNOSIS — F419 Anxiety disorder, unspecified: Secondary | ICD-10-CM | POA: Diagnosis not present

## 2023-05-06 MED ORDER — VORTIOXETINE HBR 5 MG PO TABS
5.0000 mg | ORAL_TABLET | Freq: Every day | ORAL | 1 refills | Status: AC
Start: 2023-05-06 — End: ?

## 2023-05-06 NOTE — Patient Instructions (Addendum)
We can try low-dose Trintellix for now which is primarily indicated for depression but may also help with anxiety.  May have less chance of sexual side effects with this medication.  Although it is primarily indicated for depression I do think it will help with anxiety symptoms as well.  Let me know if you have any nausea, vomiting, diarrhea or trouble getting to sleep on this medication as those are sometimes side effects seen.  Continue meeting with therapist, see information below on managing anxiety symptoms. Recheck in 1 month but let me know if there are questions sooner.  Managing Anxiety, Adult After being diagnosed with anxiety, you may be relieved to know why you have felt or behaved a certain way. You may also feel overwhelmed about the treatment ahead and what it will mean for your life. With care and support, you can manage your anxiety. How to manage lifestyle changes Understanding the difference between stress and anxiety Although stress can play a role in anxiety, it is not the same as anxiety. Stress is your body's reaction to life changes and events, both good and bad. Stress is often caused by something external, such as a deadline, test, or competition. It normally goes away after the event has ended and will last just a few hours. But, stress can be ongoing and can lead to more than just stress. Anxiety is caused by something internal, such as imagining a terrible outcome or worrying that something will go wrong that will greatly upset you. Anxiety often does not go away even after the event is over, and it can become a long-term (chronic) worry. Lowering stress and anxiety Talk with your health care provider or a counselor to learn more about lowering anxiety and stress. They may suggest tension-reduction techniques, such as: Music. Spend time creating or listening to music that you enjoy and that inspires you. Mindfulness-based meditation. Practice being aware of your normal  breaths while not trying to control your breathing. It can be done while sitting or walking. Centering prayer. Focus on a word, phrase, or sacred image that means something to you and brings you peace. Deep breathing. Expand your stomach and inhale slowly through your nose. Hold your breath for 3-5 seconds. Then breathe out slowly, letting your stomach muscles relax. Self-talk. Learn to notice and spot thought patterns that lead to anxiety reactions. Change those patterns to thoughts that feel peaceful. Muscle relaxation. Take time to tense muscles and then relax them. Choose a tension-reduction technique that fits your lifestyle and personality. These techniques take time and practice. Set aside 5-15 minutes a day to do them. Specialized therapists can offer counseling and training in these techniques. The training to help with anxiety may be covered by some insurance plans. Other things you can do to manage stress and anxiety include: Keeping a stress diary. This can help you learn what triggers your reaction and then learn ways to manage your response. Thinking about how you react to certain situations. You may not be able to control everything, but you can control your response. Making time for activities that help you relax and not feeling guilty about spending your time in this way. Doing visual imagery. This involves imagining or creating mental pictures to help you relax. Practicing yoga. Through yoga poses, you can lower tension and relax.  Medicines Medicines for anxiety include: Antidepressant medicines. These are usually prescribed for long-term daily control. Anti-anxiety medicines. These may be added in severe cases, especially when panic attacks occur.  When used together, medicines, psychotherapy, and tension-reduction techniques may be the most effective treatment. Relationships Relationships can play a big part in helping you recover. Spend more time connecting with trusted  friends and family members. Think about going to couples counseling if you have a partner, taking family education classes, or going to family therapy. Therapy can help you and others better understand your anxiety. How to recognize changes in your anxiety Everyone responds differently to treatment for anxiety. Recovery from anxiety happens when symptoms lessen and stop interfering with your daily life at home or work. This may mean that you will start to: Have better concentration and focus. Worry will interfere less in your daily thinking. Sleep better. Be less irritable. Have more energy. Have improved memory. Try to recognize when your condition is getting worse. Contact your provider if your symptoms interfere with home or work and you feel like your condition is not improving. Follow these instructions at home: Activity Exercise. Adults should: Exercise for at least 150 minutes each week. The exercise should increase your heart rate and make you sweat (moderate-intensity exercise). Do strengthening exercises at least twice a week. Get the right amount and quality of sleep. Most adults need 7-9 hours of sleep each night. Lifestyle  Eat a healthy diet that includes plenty of vegetables, fruits, whole grains, low-fat dairy products, and lean protein. Do not eat a lot of foods that are high in fats, added sugars, or salt (sodium). Make choices that simplify your life. Do not use any products that contain nicotine or tobacco. These products include cigarettes, chewing tobacco, and vaping devices, such as e-cigarettes. If you need help quitting, ask your provider. Avoid caffeine, alcohol, and certain over-the-counter cold medicines. These may make you feel worse. Ask your pharmacist which medicines to avoid. General instructions Take over-the-counter and prescription medicines only as told by your provider. Keep all follow-up visits. This is to make sure you are managing your anxiety well or  if you need more support. Where to find support You can get help and support from: Self-help groups. Online and [employer]. A trusted spiritual leader. Couples counseling. Family education classes. Family therapy. Where to find more information You may find that joining a support group helps you deal with your anxiety. The following sources can help you find counselors or support groups near you: Mental Health America: mentalhealthamerica.net Anxiety and Depression Association of [place] (ADAA): adaa.org The First American on Mental Illness (NAMI): nami.org Contact a health care provider if: You have a hard time staying focused or finishing tasks. You spend many hours a day feeling worried about everyday life. You are very tired because you cannot stop worrying. You start to have headaches or often feel tense. You have chronic nausea or diarrhea. Get help right away if: Your heart feels like it is racing. You have shortness of breath. You have thoughts of hurting yourself or others. Get help right away if you feel like you may hurt yourself or others, or have thoughts about taking your own life. Go to your nearest emergency room or: Call 911. Call the National Suicide Prevention Lifeline at 662 260 7722 or 988. This is open 24 hours a day. Text the Crisis Text Line at (905)534-6123. This information is not intended to replace advice given to you by your health care provider. Make sure you discuss any questions you have with your health care provider. Document Revised: 05/08/2022 Document Reviewed: 11/20/2020 Elsevier Patient Education  2024 [employer].
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
## 2023-05-07 ENCOUNTER — Encounter: Payer: Self-pay | Admitting: Family Medicine

## 2023-05-08 ENCOUNTER — Encounter: Payer: Self-pay | Admitting: Family Medicine

## 2023-05-09 ENCOUNTER — Other Ambulatory Visit (HOSPITAL_COMMUNITY): Payer: Self-pay

## 2023-05-09 ENCOUNTER — Telehealth: Payer: Self-pay

## 2023-05-09 NOTE — Telephone Encounter (Signed)
Pharmacy Patient Advocate Encounter   Received notification from Patient Advice Request messages that prior authorization for Trintellix (formerly Brintellix) 5MG  tablets is required/requested.   Insurance verification completed.   The patient is insured through [employer] .   Per test claim: PA required; PA submitted to [facility] via CoverMyMeds Key/confirmation #/EOC [id number] Status is pending

## 2023-05-09 NOTE — Telephone Encounter (Signed)
Patients prescription for Trintellix needs Prior Auth please start this as soon as possible if not already

## 2023-05-10 NOTE — Telephone Encounter (Signed)
Please advise, what else should pt try

## 2023-05-10 NOTE — Telephone Encounter (Signed)
Additional information has been requested from the patient's insurance in order to proceed with the prior authorization request. Requested information has been sent, or form has been filled out and faxed back to [phone]

## 2023-05-10 NOTE — Telephone Encounter (Signed)
Message sent to patient

## 2023-05-10 NOTE — Telephone Encounter (Signed)
Pharmacy Patient Advocate Encounter  Received notification from [facility] that Prior Authorization for Trintellix (formerly Brintellix) 5MG  tablets  has been DENIED.  Full denial letter will be uploaded to the media tab. See denial reason below.   PA #/Case ID/Reference #: [id number]          Please be advised we currently do not have a Pharmacist to review denials, therefore you will need to process appeals accordingly as needed. Thanks for your support at this time. Contact for appeals are as follows: Phone: [phone], Fax: [phone]  Appeal information scanned into patient's chart.

## 2023-05-13 ENCOUNTER — Other Ambulatory Visit: Payer: Self-pay

## 2023-05-13 ENCOUNTER — Other Ambulatory Visit: Payer: Self-pay | Admitting: Family Medicine

## 2023-05-13 DIAGNOSIS — Z113 Encounter for screening for infections with a predominantly sexual mode of transmission: Secondary | ICD-10-CM

## 2023-05-13 DIAGNOSIS — Z79899 Other long term (current) drug therapy: Secondary | ICD-10-CM

## 2023-05-13 NOTE — Telephone Encounter (Signed)
Pt willing to try low dose SSRI please advise

## 2023-05-14 MED ORDER — SERTRALINE HCL 25 MG PO TABS: 25.0000 mg | ORAL_TABLET | Freq: Every day | ORAL | 3 refills | Status: DC

## 2023-05-14 NOTE — Addendum Note (Signed)
Addended by: Meredith Staggers R on: 05/14/2023 08:33 AM   Modules accepted: Orders

## 2023-05-14 NOTE — Telephone Encounter (Signed)
Low-dose sertraline ordered, message sent to patient.

## 2023-05-20 DIAGNOSIS — N1831 Chronic kidney disease, stage 3a: Secondary | ICD-10-CM | POA: Diagnosis not present

## 2023-05-23 DIAGNOSIS — N1831 Chronic kidney disease, stage 3a: Secondary | ICD-10-CM | POA: Diagnosis not present

## 2023-05-23 DIAGNOSIS — I5032 Chronic diastolic (congestive) heart failure: Secondary | ICD-10-CM | POA: Diagnosis not present

## 2023-05-23 DIAGNOSIS — I130 Hypertensive heart and chronic kidney disease with heart failure and stage 1 through stage 4 chronic kidney disease, or unspecified chronic kidney disease: Secondary | ICD-10-CM | POA: Diagnosis not present

## 2023-05-23 DIAGNOSIS — I2510 Atherosclerotic heart disease of native coronary artery without angina pectoris: Secondary | ICD-10-CM | POA: Diagnosis not present

## 2023-05-23 DIAGNOSIS — M1000 Idiopathic gout, unspecified site: Secondary | ICD-10-CM | POA: Diagnosis not present

## 2023-05-31 ENCOUNTER — Other Ambulatory Visit: Payer: Self-pay | Admitting: Rheumatology

## 2023-05-31 NOTE — Telephone Encounter (Signed)
Last Fill: 03/01/2023  Labs: 04/01/2023 GFR 52.89, Monocytes Relative 15.3, Monocytes Absolute 1.5  Next Visit: 07/30/2023  Last Visit: 01/28/2023  DX:  Idiopathic chronic gout of multiple sites without tophus   Current Dose per office note 01/28/2023: Allopurinol 300 mg by mouth daily   Okay to refill Allopurinol?

## 2023-06-05 ENCOUNTER — Encounter: Payer: Self-pay | Admitting: Family Medicine

## 2023-06-05 DIAGNOSIS — F419 Anxiety disorder, unspecified: Secondary | ICD-10-CM

## 2023-06-06 MED ORDER — SERTRALINE HCL 25 MG PO TABS: 25.0000 mg | ORAL_TABLET | Freq: Every day | ORAL | 1 refills | Status: DC

## 2023-06-06 NOTE — Addendum Note (Signed)
Addended by: Meredith Staggers R on: 06/06/2023 10:35 PM   Modules accepted: Orders

## 2023-06-06 NOTE — Telephone Encounter (Signed)
Mr. Zientek,  Thanks for the update.  Great news on that medication.  I am happy to send in refills and I changed it to a 90-day supply to make that easier.  There are a few refills of the 30-day supply if you still prefer monthly prescription as opposed to a 74-month prescription, but let me know if that is the case and I will send in additional refills of the 30-day supplies.  Let's follow-up in the next 4-6 months to make sure that med is still doing well.  Keep me posted if anything changes sooner.  Take care!  Dr. Neva Seat

## 2023-06-17 ENCOUNTER — Telehealth: Payer: Medicare PPO | Admitting: Physician Assistant

## 2023-06-17 DIAGNOSIS — J208 Acute bronchitis due to other specified organisms: Secondary | ICD-10-CM | POA: Diagnosis not present

## 2023-06-17 DIAGNOSIS — B9689 Other specified bacterial agents as the cause of diseases classified elsewhere: Secondary | ICD-10-CM | POA: Diagnosis not present

## 2023-06-17 MED ORDER — AZITHROMYCIN 250 MG PO TABS
ORAL_TABLET | ORAL | 0 refills | Status: AC
Start: 2023-06-17 — End: 2023-06-22

## 2023-06-17 MED ORDER — ALBUTEROL SULFATE HFA 108 (90 BASE) MCG/ACT IN AERS
1.0000 | INHALATION_SPRAY | Freq: Four times a day (QID) | RESPIRATORY_TRACT | 0 refills | Status: AC | PRN
Start: 2023-06-17 — End: ?

## 2023-06-17 MED ORDER — PREDNISONE 10 MG (21) PO TBPK
ORAL_TABLET | ORAL | 0 refills | Status: DC
Start: 2023-06-17 — End: 2023-08-26

## 2023-06-17 MED ORDER — BENZONATATE 100 MG PO CAPS
100.0000 mg | ORAL_CAPSULE | Freq: Three times a day (TID) | ORAL | 0 refills | Status: DC | PRN
Start: 2023-06-17 — End: 2023-08-26

## 2023-06-17 NOTE — Progress Notes (Signed)
E-Visit for Cough   We are sorry that you are not feeling well.  Here is how we plan to help!  Based on your presentation I believe you most likely have A cough due to bacteria.  When patients have a fever and a productive cough with a change in color or increased sputum production, we are concerned about bacterial bronchitis.  If left untreated it can progress to pneumonia.  If your symptoms do not improve with your treatment plan it is important that you contact your provider.   I have prescribed Azithromyin 250 mg: two tablets now and then one tablet daily for 4 additonal days    In addition you may use A non-prescription cough medication called Mucinex DM: take 2 tablets every 12 hours. and A prescription cough medication called Tessalon Perles 100mg . You may take 1-2 capsules every 8 hours as needed for your cough.  Prednisone 10 mg daily for 6 days (see taper instructions below)  Directions for 6 day taper: Day 1: 2 tablets before breakfast, 1 after both lunch & dinner and 2 at bedtime Day 2: 1 tab before breakfast, 1 after both lunch & dinner and 2 at bedtime Day 3: 1 tab at each meal & 1 at bedtime Day 4: 1 tab at breakfast, 1 at lunch, 1 at bedtime Day 5: 1 tab at breakfast & 1 tab at bedtime Day 6: 1 tab at breakfast  I have also prescribed Albuterol inhaler Use 1-2 puffs as needed for shortness of breath and wheezing.  From your responses in the eVisit questionnaire you describe inflammation in the upper respiratory tract which is causing a significant cough.  This is commonly called Bronchitis and has four common causes:   Allergies Viral Infections Acid Reflux Bacterial Infection Allergies, viruses and acid reflux are treated by controlling symptoms or eliminating the cause. An example might be a cough caused by taking certain blood pressure medications. You stop the cough by changing the medication. Another example might be a cough caused by acid reflux. Controlling the reflux  helps control the cough.  USE OF BRONCHODILATOR ("RESCUE") INHALERS: There is a risk from using your bronchodilator too frequently.  The risk is that over-reliance on a medication which only relaxes the muscles surrounding the breathing tubes can reduce the effectiveness of medications prescribed to reduce swelling and congestion of the tubes themselves.  Although you feel brief relief from the bronchodilator inhaler, your asthma may actually be worsening with the tubes becoming more swollen and filled with mucus.  This can delay other crucial treatments, such as oral steroid medications. If you need to use a bronchodilator inhaler daily, several times per day, you should discuss this with your provider.  There are probably better treatments that could be used to keep your asthma under control.     HOME CARE Only take medications as instructed by your medical team. Complete the entire course of an antibiotic. Drink plenty of fluids and get plenty of rest. Avoid close contacts especially the very young and the elderly Cover your mouth if you cough or cough into your sleeve. Always remember to wash your hands A steam or ultrasonic humidifier can help congestion.   GET HELP RIGHT AWAY IF: You develop worsening fever. You become short of breath You cough up blood. Your symptoms persist after you have completed your treatment plan MAKE SURE YOU  Understand these instructions. Will watch your condition. Will get help right away if you are not doing well or  get worse.    Thank you for choosing an e-visit.  Your e-visit answers were reviewed by a board certified advanced clinical practitioner to complete your personal care plan. Depending upon the condition, your plan could have included both over the counter or prescription medications.  Please review your pharmacy choice. Make sure the pharmacy is open so you can pick up prescription now. If there is a problem, you may contact your provider  through [employer] and have the prescription routed to another pharmacy.  Your safety is important to [place]. If you have drug allergies check your prescription carefully.   For the next 24 hours you can use MyChart to ask questions about today's visit, request a non-urgent call back, or ask for a work or school excuse. You will get an email in the next two days asking about your experience. I hope that your e-visit has been valuable and will speed your recovery.   I have spent 5 minutes in review of e-visit questionnaire, review and updating patient chart, medical decision making and response to patient.   Margaretann Loveless, PA-C

## 2023-06-24 ENCOUNTER — Ambulatory Visit (INDEPENDENT_AMBULATORY_CARE_PROVIDER_SITE_OTHER): Payer: Medicare PPO

## 2023-06-24 ENCOUNTER — Other Ambulatory Visit: Payer: Self-pay | Admitting: Emergency Medicine

## 2023-06-24 ENCOUNTER — Telehealth: Payer: Self-pay | Admitting: Radiology

## 2023-06-24 ENCOUNTER — Ambulatory Visit: Payer: Medicare PPO | Admitting: Emergency Medicine

## 2023-06-24 ENCOUNTER — Encounter: Payer: Self-pay | Admitting: Emergency Medicine

## 2023-06-24 ENCOUNTER — Ambulatory Visit: Payer: Medicare PPO | Admitting: Family Medicine

## 2023-06-24 VITALS — BP 124/72 | HR 60 | Temp 98.6°F | Ht 67.0 in | Wt 184.0 lb

## 2023-06-24 DIAGNOSIS — R918 Other nonspecific abnormal finding of lung field: Secondary | ICD-10-CM | POA: Diagnosis not present

## 2023-06-24 DIAGNOSIS — J90 Pleural effusion, not elsewhere classified: Secondary | ICD-10-CM | POA: Insufficient documentation

## 2023-06-24 DIAGNOSIS — R051 Acute cough: Secondary | ICD-10-CM | POA: Diagnosis not present

## 2023-06-24 DIAGNOSIS — J189 Pneumonia, unspecified organism: Secondary | ICD-10-CM | POA: Diagnosis not present

## 2023-06-24 DIAGNOSIS — R059 Cough, unspecified: Secondary | ICD-10-CM | POA: Diagnosis not present

## 2023-06-24 DIAGNOSIS — I517 Cardiomegaly: Secondary | ICD-10-CM | POA: Diagnosis not present

## 2023-06-24 LAB — CBC WITH DIFFERENTIAL/PLATELET
Basophils Absolute: 0.1 10*3/uL (ref 0.0–0.1)
Basophils Relative: 0.6 % (ref 0.0–3.0)
Eosinophils Absolute: 0.2 10*3/uL (ref 0.0–0.7)
Eosinophils Relative: 1.1 % (ref 0.0–5.0)
HCT: 45.9 % (ref 39.0–52.0)
Hemoglobin: 15.3 g/dL (ref 13.0–17.0)
Lymphocytes Relative: 15.4 % (ref 12.0–46.0)
Lymphs Abs: 2.9 10*3/uL (ref 0.7–4.0)
MCHC: 33.3 g/dL (ref 30.0–36.0)
MCV: 95.6 fL (ref 78.0–100.0)
Monocytes Absolute: 3.2 10*3/uL — ABNORMAL HIGH (ref 0.1–1.0)
Monocytes Relative: 16.8 % — ABNORMAL HIGH (ref 3.0–12.0)
Neutro Abs: 12.6 10*3/uL — ABNORMAL HIGH (ref 1.4–7.7)
Neutrophils Relative %: 66.1 % (ref 43.0–77.0)
Platelets: 233 10*3/uL (ref 150.0–400.0)
RBC: 4.8 Mil/uL (ref 4.22–5.81)
RDW: 12.5 % (ref 11.5–15.5)
WBC: 19 10*3/uL (ref 4.0–10.5)

## 2023-06-24 LAB — COMPREHENSIVE METABOLIC PANEL
ALT: 19 U/L (ref 0–53)
AST: 20 U/L (ref 0–37)
Albumin: 4.5 g/dL (ref 3.5–5.2)
Alkaline Phosphatase: 53 U/L (ref 39–117)
BUN: 26 mg/dL — ABNORMAL HIGH (ref 6–23)
CO2: 30 meq/L (ref 19–32)
Calcium: 9.3 mg/dL (ref 8.4–10.5)
Chloride: 100 meq/L (ref 96–112)
Creatinine, Ser: 1.28 mg/dL (ref 0.40–1.50)
GFR: 58.3 mL/min — ABNORMAL LOW (ref >60.00)
Glucose, Bld: 111 mg/dL — ABNORMAL HIGH (ref 70–99)
Potassium: 4.4 meq/L (ref 3.5–5.1)
Sodium: 137 meq/L (ref 135–145)
Total Bilirubin: 0.4 mg/dL (ref 0.2–1.2)
Total Protein: 7.1 g/dL (ref 6.0–8.3)

## 2023-06-24 MED ORDER — LEVOFLOXACIN 750 MG PO TABS
750.0000 mg | ORAL_TABLET | Freq: Every day | ORAL | 0 refills | Status: AC
Start: 2023-06-24 — End: 2023-07-01

## 2023-06-24 MED ORDER — HYDROCODONE BIT-HOMATROP MBR 5-1.5 MG/5ML PO SOLN
5.0000 mL | Freq: Every evening | ORAL | 0 refills | Status: DC | PRN
Start: 2023-06-24 — End: 2023-09-23

## 2023-06-24 NOTE — Assessment & Plan Note (Signed)
Did not respond to azithromycin. Recommend to start Levaquin 750 mg daily for 7 days Chest x-ray concerning for lung mass Recommend CT scan of chest

## 2023-06-24 NOTE — Assessment & Plan Note (Signed)
Most likely parapneumonic effusion. Recommend CT scan of chest

## 2023-06-24 NOTE — Progress Notes (Signed)
Jeremy Hendricks 67 y.o.   Chief Complaint  Patient presents with   Cough    Congestion, cough, trouble sleeping flat. Has been going on for a week. Had a virtual vist last week and is not feeling better     HISTORY OF PRESENT ILLNESS: This is a 67 y.o. male complaining of persistent flulike symptoms including cough for the past couple of weeks Complaining of chest congestion and trouble sleeping flat for the past week Virtual visit last week.  Was started on azithromycin along with prednisone and Tessalon Perles. Not getting better. Non-smoker.  No history of COPD or asthma. Recently travelled to [place] about 3 weeks ago.  Cough Associated symptoms include shortness of breath. Pertinent negatives include no chest pain, chills, fever, headaches, rash or sore throat.     Prior to Admission medications   Medication Sig Start Date End Date Taking? Authorizing Provider  albuterol (VENTOLIN HFA) 108 (90 Base) MCG/ACT inhaler Inhale 1-2 puffs into the lungs every 6 (six) hours as needed. 06/17/23  Yes Margaretann Loveless, PA-C  allopurinol (ZYLOPRIM) 300 MG tablet TAKE 1 TABLET BY MOUTH EVERY DAY 05/31/23  Yes Deveshwar, Janalyn Rouse, MD  atenolol (TENORMIN) 25 MG tablet TAKE 1 TABLET BY MOUTH EVERY DAY 03/01/23  Yes Kathleene Hazel, MD  benzonatate (TESSALON) 100 MG capsule Take 1-2 capsules (100-200 mg total) by mouth 3 (three) times daily as needed. 06/17/23  Yes Margaretann Loveless, PA-C  emtricitabine-tenofovir (TRUVADA) 200-300 MG tablet TAKE 1 TABLET BY MOUTH 1 TIME A DAY 05/13/23  Yes Shade Flood, MD  furosemide (LASIX) 20 MG tablet Take 1 tablet (20 mg total) by mouth every other day. 02/11/23  Yes Kathleene Hazel, MD  MITIGARE 0.6 MG CAPS TAKE 1 CAPSULE BY MOUTH DAILY AS NEEDED FOR GOUT FLARES 04/22/23  Yes Gearldine Bienenstock, PA-C  Multiple Vitamin (MULTIVITAMIN) tablet Take 1 tablet by mouth daily.   Yes [provider]  predniSONE (STERAPRED UNI-PAK 21 TAB) 10 MG  (21) TBPK tablet 6 day taper; take as directed on package instructions 06/17/23  Yes Margaretann Loveless, PA-C  rosuvastatin (CRESTOR) 40 MG tablet TAKE 1 TABLET BY MOUTH EVERY DAY 12/19/22  Yes Kathleene Hazel, MD  sertraline (ZOLOFT) 25 MG tablet Take 1 tablet (25 mg total) by mouth daily. 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 s/p CABG 2002. b. s/p redo 2012.   DJD (degenerative joint disease)    Gout    History of ETT    a. ETT 6/16:  normal    Past Surgical History:  Procedure Laterality Date   CORONARY ARTERY BYPASS GRAFT  2002   CORONARY ARTERY BYPASS GRAFT  08-2010   L-LAD remained from original CABG; new grafts incl L radial- PDA + RIMA-RI   VASECTOMY      Social History   Socioeconomic History   Marital status: Single    Spouse name: Not on file   Number of children: 2   Years of education: Not on file   Highest education level: Professional school degree (e.g., MD, DDS, DVM, JD)  Occupational History   Occupation: Realtor  Tobacco Use   Smoking status: Former    Current packs/day: 0.00    Types: Cigarettes     Quit date: 08/14/1999    Years since quitting: 23.8    Passive exposure: Past   Smokeless tobacco: Never   Tobacco comments:    Social smoker  Vaping Use   Vaping status: Never Used  Substance and Sexual Activity   Alcohol use: Yes    Comment: 1oz-2oz daily    Drug use: No   Sexual activity: Yes  Other Topics Concern   Not on file  Social History Narrative   Single   Education: College   Exercise: Yes   Social Determinants of Health   Financial Resource Strain: Low Risk  (01/02/2023)   Overall Financial Resource Strain (CARDIA)    Difficulty of Paying Living Expenses: Not hard at all  Food Insecurity: No Food Insecurity (01/02/2023)   Hunger Vital Sign    Worried About Running Out of Food in the Last Year: Never true    Ran Out of Food in the Last Year: Never true  Transportation Needs: No Transportation Needs (01/02/2023)   PRAPARE - Administrator, Civil Service (Medical): No    Lack of Transportation (Non-Medical): No  Physical Activity: Sufficiently Active (01/02/2023)   Exercise Vital Sign    Days of Exercise per Week: 5 days    Minutes of Exercise per Session: 50 min  Stress: No Stress Concern Present (01/02/2023)   [employer] of Occupational Health - Occupational Stress Questionnaire    Feeling of Stress : Only a little  Social Connections: Socially Integrated (01/02/2023)   Social Connection and Isolation Panel [NHANES]    Frequency of Communication with Friends and Family: More than three times a week    Frequency of Social Gatherings with Friends and Family: Once a week    Attends Religious Services: More than 4 times per year    Active Member of [employer] or Organizations: Yes    Attends Engineer, structural: More than 4 times per year    Marital Status: Living with partner  Intimate Partner Violence: Not At Risk (11/22/2022)   Humiliation, Afraid, Rape, and Kick questionnaire    Fear of Current or Ex-Partner: No    Emotionally Abused: No     Physically Abused: No    Sexually Abused: No    Family History  Problem Relation Age of Onset   Heart attack Father 59   Hypertension Father    Cancer Mother        Breast cancer   Cancer Other        family hx of   Coronary artery disease Other        family hx of   Hyperlipidemia Other        family hx of   Hypertension Paternal Grandfather    Healthy Son  Healthy Daughter    Stroke Neg Hx    Colon cancer Neg Hx      Review of Systems  Constitutional: Negative.  Negative for chills and fever.  HENT:  Positive for congestion. Negative for sore throat.   Respiratory:  Positive for cough and shortness of breath.   Cardiovascular: Negative.  Negative for chest pain and palpitations.  Gastrointestinal:  Negative for abdominal pain, diarrhea, nausea and vomiting.  Genitourinary: Negative.  Negative for dysuria.  Skin: Negative.  Negative for rash.  Neurological: Negative.  Negative for dizziness and headaches.  All other systems reviewed and are negative.   Today's Vitals   06/24/23 1458  BP: 124/72  Pulse: 60  Temp: 98.6 F (37 C)  TempSrc: Oral  SpO2: 96%  Weight: 184 lb (83.5 kg)  Height: 5\' 7"  (1.702 m)   Body mass index is 28.82 kg/m.   Physical Exam Vitals reviewed.  Constitutional:      Appearance: Normal appearance.  HENT:     Head: Normocephalic.  Eyes:     Extraocular Movements: Extraocular movements intact.     Pupils: Pupils are equal, round, and reactive to light.  Cardiovascular:     Rate and Rhythm: Normal rate and regular rhythm.     Pulses: Normal pulses.     Heart sounds: Normal heart sounds.  Pulmonary:     Effort: No respiratory distress.     Breath sounds: Rales (Right lower posterior third) present.     Comments: Decreased breath sounds right side with crackles Abdominal:     Palpations: Abdomen is soft.     Tenderness: There is no abdominal tenderness.  Skin:    General: Skin is warm and dry.  Neurological:     Mental  Status: He is alert and oriented to person, place, and time.  Psychiatric:        Mood and Affect: Mood normal.        Behavior: Behavior normal.      ASSESSMENT & PLAN: A total of 44 minutes was spent with the patient and counseling/coordination of care regarding preparing for this visit, review of most recent office visit notes, review of chest x-ray images done today, diagnosis of pneumonia and need for treatment with antibiotics, findings of possible right lung mass and need for urgent CT scan of chest, ED precautions, prognosis, cough management, documentation and need for follow-up.  Problem List Items Addressed This Visit       Respiratory   Community acquired pneumonia of right lower lobe of lung - Primary    Did not respond to azithromycin. Recommend to start Levaquin 750 mg daily for 7 days Chest x-ray concerning for lung mass Recommend CT scan of chest      Relevant Medications   levofloxacin (LEVAQUIN) 750 MG tablet   HYDROcodone bit-homatropine (HYCODAN) 5-1.5 MG/5ML syrup   Other Relevant Orders   Comprehensive metabolic panel   CBC with Differential/Platelet   Pleural effusion on right    Most likely parapneumonic effusion. Recommend CT scan of chest      Relevant Orders   CT Chest Wo Contrast     Other   Acute cough    Cough management discussed Advised to continue Mucinex DM over-the-counter Hycodan syrup at bedtime      Relevant Medications   HYDROcodone bit-homatropine (HYCODAN) 5-1.5 MG/5ML syrup   Other Relevant Orders   DG Chest 2 View   Right lower lobe lung mass    Non-smoker. Right lower lobe  infiltrate, possible mass CT scan of chest requested today      Relevant Orders   CT Chest Wo Contrast   Patient Instructions  Community-Acquired Pneumonia, Adult Pneumonia is an infection of the lungs. It causes irritation and swelling in the airways of the lungs. Mucus and fluid may also build up inside the airways. This may cause coughing and  trouble breathing. One type of pneumonia can happen while you are in a hospital. A different type can happen when you are not in a hospital (community-acquired pneumonia). What are the causes?  This condition is caused by germs (viruses, bacteria, or fungi). Some types of germs can spread from person to person. Pneumonia is not thought to spread from person to person. What increases the risk? You have a long-term (chronic) disease, such as: Disease of the lungs. This may be chronic obstructive pulmonary disease (COPD) or asthma. Heart failure. Cystic fibrosis. Diabetes. Kidney disease. Sickle cell disease. HIV. You have other health problems, such as: Your body's defense system (immune system) is weak. A condition that may cause you to breathe in fluids from your mouth and nose. You had your spleen taken out. You do not take good care of your teeth and mouth (poor dental hygiene). You use or have used tobacco products. You go where the germs that cause this illness are common. You are older than 66 years of age. What are the signs or symptoms? A cough. A fever. Sweating or chills. Chest pain, often when you breathe deeply or cough. Breathing problems, such as: Fast breathing. Trouble breathing. Shortness of breath. Feeling tired (fatigued). Muscle aches. How is this treated? Treatment for this condition depends on many things, such as: The cause of your illness. Your medicines. Your other health problems. Most adults can be treated at home. Sometimes, treatment must happen in a hospital. Treatment may include medicines to kill germs. Medicines may depend on which germ caused your illness. Very bad pneumonia is rare. If you get it, you may: Have a machine to help you breathe. Have fluid taken away from around your lungs. Follow these instructions at home: Medicines Take over-the-counter and prescription medicines only as told by your doctor. Take cough medicine only if  you are losing sleep. Cough medicine can keep your body from taking mucus away from your lungs. If you were prescribed antibiotics, take them as told by your doctor. Do not stop taking them even if you start to feel better. Lifestyle     Do not smoke or use any products that contain nicotine or tobacco. If you need help quitting, ask your doctor. Do not drink alcohol. Eat a healthy diet. This includes a lot of vegetables, fruits, whole grains, low-fat dairy products, and low-fat (lean) protein. General instructions  Rest a lot. Sleep for at least 8 hours each night. Sleep with your head and neck raised. Put a few pillows under your head or sleep in a reclining chair. Return to your normal activities as told by your doctor. Ask your doctor what activities are safe for you. Drink enough fluid to keep your pee (urine) pale yellow. If your throat is sore, gargle with a mixture of salt and water 3-4 times a day or as needed. To make salt water, completely dissolve -1 tsp (3-6 g) of salt in 1 cup (237 mL) of warm water. Keep all follow-up visits. How is this prevented? Getting the pneumonia shot (vaccine). These shots have different types and schedules. Ask your doctor what works  best for you. Think about getting this shot if: You are older than 66 years of age. You are 62-36 years of age and: You are being treated for cancer. You have long-term lung disease. You have other problems that affect your body's defense system. Ask your doctor if you have one of these. Getting your flu shot every year. Ask your doctor which type of shot is best for you. Going to the dentist as often as told. Washing your hands often with soap and water for at least 20 seconds. If you cannot use soap and water, use hand sanitizer. Contact a doctor if: You have a fever. You lose sleep because your cough medicine does not help. Get help right away if: You are short of breath and this gets worse. You have more chest  pain. Your sickness gets worse. This is very serious if: You are an older adult. Your body's defense system is weak. You cough up blood. These symptoms may be an emergency. Get help right away. Call 911. Do not wait to see if the symptoms will go away. Do not drive yourself to the hospital. Summary Pneumonia is an infection of the lungs. Community-acquired pneumonia affects people who have not been in the hospital. Certain germs can cause this infection. This condition may be treated with medicines that kill germs. For very bad pneumonia, you may need a hospital stay and treatment to help with breathing. This information is not intended to replace advice given to you by your health care provider. Make sure you discuss any questions you have with your health care provider. Document Revised: 09/27/2021 Document Reviewed: 09/27/2021 Elsevier Patient Education  2024 Elsevier Inc.    Edwina Barth, MD [facility]

## 2023-06-24 NOTE — Telephone Encounter (Signed)
Thank you :)

## 2023-06-24 NOTE — Patient Instructions (Signed)

## 2023-06-24 NOTE — Assessment & Plan Note (Addendum)
Non-smoker. Right lower lobe infiltrate, possible mass CT scan of chest requested today

## 2023-06-24 NOTE — Assessment & Plan Note (Signed)
Cough management discussed Advised to continue Mucinex DM over-the-counter Hycodan syrup at bedtime

## 2023-06-25 ENCOUNTER — Other Ambulatory Visit: Payer: Self-pay | Admitting: Emergency Medicine

## 2023-06-25 ENCOUNTER — Telehealth: Payer: Self-pay | Admitting: *Deleted

## 2023-06-25 ENCOUNTER — Encounter: Payer: Self-pay | Admitting: *Deleted

## 2023-06-25 ENCOUNTER — Ambulatory Visit
Admission: RE | Admit: 2023-06-25 | Discharge: 2023-06-25 | Disposition: A | Discharge status: Home / Self Care | Payer: Medicare PPO | Source: Ambulatory Visit | Attending: Emergency Medicine | Admitting: Emergency Medicine

## 2023-06-25 DIAGNOSIS — R918 Other nonspecific abnormal finding of lung field: Secondary | ICD-10-CM

## 2023-06-25 DIAGNOSIS — I700 Atherosclerosis of aorta: Secondary | ICD-10-CM | POA: Diagnosis not present

## 2023-06-25 DIAGNOSIS — J948 Other specified pleural conditions: Secondary | ICD-10-CM | POA: Diagnosis not present

## 2023-06-25 DIAGNOSIS — J90 Pleural effusion, not elsewhere classified: Secondary | ICD-10-CM

## 2023-06-25 MED ORDER — TRAMADOL HCL 50 MG PO TABS: 50.0000 mg | ORAL_TABLET | Freq: Three times a day (TID) | ORAL | 0 refills | Status: AC | PRN

## 2023-06-25 NOTE — Telephone Encounter (Signed)
New prescription for tramadol sent to pharmacy of record today.  He should also touch base and follow-up with his PCP.  Thanks.

## 2023-06-25 NOTE — Telephone Encounter (Signed)
Patient came into office and I informed him of provider recommendation of a Urgent pulmonary referral. Patients has not seen anyone since his initial imaging scans 5 years ago.   Patient states he felt ok last night after taking the antibiotic and cough medication. Patient states today he feels bad. He wants a rx for some pain medication, he said he hurts when he coughs. Poss muscle pain chest area. Please advise

## 2023-06-26 ENCOUNTER — Encounter: Payer: Self-pay | Admitting: Emergency Medicine

## 2023-07-15 ENCOUNTER — Encounter: Payer: Self-pay | Admitting: Pulmonary Disease

## 2023-07-15 ENCOUNTER — Ambulatory Visit: Payer: Medicare PPO | Admitting: Pulmonary Disease

## 2023-07-15 VITALS — BP 130/70 | HR 57 | Ht 67.0 in | Wt 182.2 lb

## 2023-07-15 DIAGNOSIS — J90 Pleural effusion, not elsewhere classified: Secondary | ICD-10-CM

## 2023-07-15 NOTE — Patient Instructions (Signed)
VISIT SUMMARY:  You came in today for a follow-up visit after recently having pneumonia. You initially thought you had severe seasonal allergies, which you believe progressed to bronchitis and then pneumonia. You were treated with antibiotics and prednisone and have since returned to your regular activities, including running a 5K race. You also mentioned that you sometimes have to prop yourself up in bed to prevent wheezing at night. We discussed your history of two open heart surgeries and a stable chronic pleural effusion in your right lung.  YOUR PLAN:  -CHRONIC PLEURAL EFFUSION: A chronic pleural effusion is a long-term collection of fluid in the space around your lungs. In your case, it is likely due to a previous infection or injury and has been stable since 2019. It is walled off and calcified, indicating it is not harmful. We will repeat a CT scan in 1 year to monitor its stability.  -RECENT PNEUMONIA: Pneumonia is an infection that inflames the air sacs in one or both lungs. Your pneumonia has resolved with antibiotics and prednisone, and you have returned to your regular activities. You mentioned occasional wheezing at night, which may be due to residual inflammation from the recent pneumonia. No further intervention is required at this time.  -GENERAL HEALTH MAINTENANCE: We reviewed your routine labs and normal studies. Continue with your current lifestyle and health practices. Follow up in 1 year with a repeat CT scan to monitor your chronic pleural effusion.  INSTRUCTIONS:  Please follow up in 1 year for a repeat CT scan to monitor your chronic pleural effusion.

## 2023-07-16 NOTE — Progress Notes (Signed)
Office Visit Note  Patient: Jeremy Hendricks             Date of Birth: Jan 18, 1956           MRN: [id number]             PCP: Shade Flood, MD Referring: Shade Flood, MD Visit Date: 07/30/2023 Occupation: @GUAROCC @  Subjective:  Medication monitoring  History of Present Illness: Jeremy Hendricks is a 67 y.o. male with history of gout and osteoarthritis.  Patient denies any signs or symptoms of a gout flare.  He has clinically been doing well taking allopurinol 300 mg 1 tablet every other day.  Patient decided to space the dosing to every other day since he has not had any recurrence of a flare.  He has not needed to take Mitigare since his last office visit.  Patient states that he ran a 5K on Thanksgiving and had some difficulty since he was recovering from pneumonia prior to the race.  Patient states that after completing the race he has been more motivated to increase his exercise regimen.  He has been trying to run a 5K every other day at the gym.  He states that at times he has intermittent discomfort in his right knee.  Patient states last week he had some anterior shin pain which lasted for about 3 days but has resolved.  He is currently having some soreness along the medial joint line of the right knee.  He denies any warmth or swelling.  He denies any other joint pain or swelling at this time.     Activities of Daily Living:  Patient reports morning stiffness for 3 minutes.   Patient Denies nocturnal pain.  Difficulty dressing/grooming: Denies Difficulty climbing stairs: Denies Difficulty getting out of chair: Denies Difficulty using hands for taps, buttons, cutlery, and/or writing: Denies  Review of Systems  Constitutional:  Positive for fatigue.  HENT:  Negative for mouth sores and mouth dryness.   Eyes:  Negative for dryness.  Respiratory:  Positive for shortness of breath.   Cardiovascular:  Negative for chest pain and palpitations.  Gastrointestinal:  Negative  for blood in stool, constipation and diarrhea.  Endocrine: Negative for increased urination.  Genitourinary:  Negative for involuntary urination.  Musculoskeletal:  Positive for joint pain, joint pain and morning stiffness. Negative for gait problem, joint swelling, myalgias, muscle weakness, muscle tenderness and myalgias.  Skin:  Positive for hair loss. Negative for color change, rash and sensitivity to sunlight.  Allergic/Immunologic: Negative for susceptible to infections.  Neurological:  Negative for dizziness and headaches.  Hematological:  Negative for swollen glands.  Psychiatric/Behavioral:  Negative for depressed mood and sleep disturbance. The patient is nervous/anxious.     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 s/p CABG 2002. b. s/p redo 2012.   DJD (degenerative joint disease)    Gout    History of ETT    a. ETT 6/16:  normal    Family History  Problem Relation Age of Onset   Heart attack Father 35   Hypertension Father    Cancer Mother        Breast cancer   Cancer Other         family hx of   Coronary artery disease Other        family hx of   Hyperlipidemia Other        family hx of   Hypertension Paternal Grandfather    Healthy Son    Healthy Daughter    Stroke Neg Hx    Colon cancer Neg Hx    Past Surgical History:  Procedure Laterality Date   CORONARY ARTERY BYPASS GRAFT  2002   CORONARY ARTERY BYPASS GRAFT  08-2010   L-LAD remained from original CABG; new grafts incl L radial- PDA + RIMA-RI   VASECTOMY     Social History   Social History Narrative   Single   Education: College   Exercise: Yes   Immunization History  Administered Date(s) Administered   Fluad Quad(high Dose 65+) 08/17/2022   Fluad Trivalent(High Dose 65+) 04/09/2023   Hepatitis B 09/20/2000   Influenza Split 05/14/2015, 06/11/2016   Influenza,inj,Quad PF,6+ Mos 04/04/2017, 04/15/2018, 05/05/2019, 04/20/2020   Influenza-Unspecified 04/04/2017   PFIZER Comirnaty(Gray Top)Covid-19 Tri-Sucrose Vaccine 12/29/2020   PFIZER(Purple Top)SARS-COV-2 Vaccination 10/24/2019, 11/17/2019, 06/09/2020, 05/16/2021, 02/14/2022   PNEUMOCOCCAL CONJUGATE-20 02/14/2022   Pfizer Covid-19 Vaccine Bivalent Booster 38yrs & up 05/16/2021   Tdap 02/11/2017   Unspecified SARS-COV-2 Vaccination 06/01/2022   Zoster Recombinant(Shingrix) 08/17/2022   Zoster, Live 08/13/2013     Objective: Vital Signs: BP 133/76 (BP Location: Left Arm, Patient Position: Sitting, Cuff Size: Normal)   Pulse (!) 55   Resp 15   Ht 5\' 7"  (1.702 m)   Wt 182 lb (82.6 kg)   BMI 28.51 kg/m    Physical Exam Vitals and nursing note reviewed.  Constitutional:      Appearance: He is well-developed.  HENT:     Head: Normocephalic and atraumatic.  Eyes:     Conjunctiva/sclera: Conjunctivae normal.     Pupils: Pupils are equal, round, and reactive to light.  Cardiovascular:     Rate and Rhythm: Normal rate and regular rhythm.     Heart sounds: Normal heart sounds.  Pulmonary:     Effort: Pulmonary effort is normal.      Breath sounds: Normal breath sounds.  Abdominal:     General: Bowel sounds are normal.     Palpations: Abdomen is soft.  Musculoskeletal:     Cervical back: Normal range of motion and neck supple.  Skin:    General: Skin is warm and dry.     Capillary Refill: Capillary refill takes less than 2 seconds.  Neurological:     Mental Status: He is alert and oriented to person, place, and time.  Psychiatric:        Behavior: Behavior normal.      Musculoskeletal Exam: C-spine has limited range of motion.  No  midline spinal tenderness.  Shoulder joints, elbow joints, wrist joints, MCPs, PIPs, DIPs have good range of motion with no synovitis.  Complete fist formation bilaterally.  Hip joints have good range of motion with no groin pain.  Knee joints have good range of motion with no effusion.  Some tenderness along the medial joint line of the right knee.  Ankle joints have good range of motion with no tenderness or joint swelling.  CDAI Exam: CDAI Score: -- Patient Global: --; Provider Global: -- Swollen: --; Tender: -- Joint Exam 07/30/2023   No joint exam has been documented for this visit   There is currently no information documented on the homunculus. Go to the Rheumatology activity and complete the homunculus joint exam.  Investigation: No additional findings.  Imaging: No results found.  Recent Labs: Lab Results  Component Value Date   WBC 19.0 cH (HH) 06/24/2023   HGB 15.3 06/24/2023   PLT 233.0 06/24/2023   NA 137 06/24/2023   K 4.4 06/24/2023   CL 100 06/24/2023   CO2 30 06/24/2023   GLUCOSE 111 (H) 06/24/2023   BUN 26 (H) 06/24/2023   CREATININE 1.28 06/24/2023   BILITOT 0.4 06/24/2023   ALKPHOS 53 06/24/2023   AST 20 06/24/2023   ALT 19 06/24/2023   PROT 7.1 06/24/2023   ALBUMIN 4.5 06/24/2023   CALCIUM 9.3 06/24/2023   GFRAA 65 12/08/2020    Speciality Comments: No specialty comments available.  Procedures:  No procedures performed Allergies: Patient  has no known allergies.    Assessment / Plan:     Visit Diagnoses: Idiopathic chronic gout of multiple sites without tophus -He has not had any signs or symptoms of a gout flare.  He started to space his allopurinol dose to 300 mg every other day instead of daily since he has not had any signs or symptoms of a flare.  He has not noticed any breakthrough symptoms since reducing the dose of allopurinol.  He has not needed to take Mitigare since his last office visit.  No joint inflammation was noted on examination today.  He has been following dietary recommendations which he feels has been helpful.  Uric acid was 5.7 on 01/28/2023.  Plan to update uric acid level today and can make any dosing adjustments necessary once complete.  He was advised to notify us if he develops signs or symptoms of a flare.  He will follow-up in the office in 6 months or sooner if needed.  Plan: Uric acid  Medication monitoring encounter - Allopurinol 300 mg by mouth every other day.  CBC and CMP updated on 06/24/23.  Uric acid 5.7 on 01/28/23.  Plan to update uric acid level today.  Dose adjustments will be made if needed pending lab results.   - Plan: Uric acid  Chronic pain of left knee: No warmth or effusion noted on examination today.  Patient has been running a 5K every other day on the treadmill at the gym.  Primary osteoarthritis of both hands: He has PIP and DIP thickening consistent with osteoarthritis of both hands.  He has been experiencing locking of the right middle finger consistent with a trigger finger especially while lifting weights.  He has no active inflammation on examination today.  Discussed the importance of joint protection and muscle strengthening.  Pain in right hip: He is not experiencing any increased groin pain currently.  Primary osteoarthritis of both feet: He has good range of motion of both ankle joints with no  joint tenderness or synovitis.  Degeneration of intervertebral disc of lumbar  region without discogenic back pain or lower extremity pain: No midline spinal tenderness.  No symptoms of radiculopathy at this time.  Other medical conditions are listed as follows:  Neuropathy  CKD (chronic kidney disease) stage 2, GFR 60-89 ml/min: Creatinine was 1.28 and GFR was 58.30 on 06/24/2023.  Atherosclerosis of coronary artery bypass graft of native heart without angina pectoris  Chronic diastolic CHF (congestive heart failure) (HCC)  History of atrial fibrillation  History of hyperlipidemia  Other insomnia  Low testosterone -Patient requested to have testosterone checked today-results will be forwarded to PCP. plan: Testosterone  Orders: Orders Placed This Encounter  Procedures   Uric acid   Testosterone     Follow-Up Instructions: Return in about 6 months (around 01/28/2024) for Gout, Osteoarthritis.   Gearldine Bienenstock, PA-C  Note - This record has been created using Dragon software.  Chart creation errors have been sought, but may not always  have been located. Such creation errors do not reflect on  the standard of medical care.

## 2023-07-25 ENCOUNTER — Other Ambulatory Visit: Payer: Self-pay | Admitting: Family

## 2023-07-30 ENCOUNTER — Encounter: Payer: Self-pay | Admitting: Physician Assistant

## 2023-07-30 ENCOUNTER — Ambulatory Visit: Payer: Medicare PPO | Attending: Physician Assistant | Admitting: Physician Assistant

## 2023-07-30 VITALS — BP 133/76 | HR 55 | Resp 15 | Ht 67.0 in | Wt 182.0 lb

## 2023-07-30 DIAGNOSIS — Z8679 Personal history of other diseases of the circulatory system: Secondary | ICD-10-CM

## 2023-07-30 DIAGNOSIS — G629 Polyneuropathy, unspecified: Secondary | ICD-10-CM | POA: Diagnosis not present

## 2023-07-30 DIAGNOSIS — Z8639 Personal history of other endocrine, nutritional and metabolic disease: Secondary | ICD-10-CM

## 2023-07-30 DIAGNOSIS — R7989 Other specified abnormal findings of blood chemistry: Secondary | ICD-10-CM

## 2023-07-30 DIAGNOSIS — M1A09X0 Idiopathic chronic gout, multiple sites, without tophus (tophi): Secondary | ICD-10-CM | POA: Diagnosis not present

## 2023-07-30 DIAGNOSIS — M25551 Pain in right hip: Secondary | ICD-10-CM | POA: Diagnosis not present

## 2023-07-30 DIAGNOSIS — N182 Chronic kidney disease, stage 2 (mild): Secondary | ICD-10-CM

## 2023-07-30 DIAGNOSIS — M19041 Primary osteoarthritis, right hand: Secondary | ICD-10-CM | POA: Diagnosis not present

## 2023-07-30 DIAGNOSIS — M19071 Primary osteoarthritis, right ankle and foot: Secondary | ICD-10-CM

## 2023-07-30 DIAGNOSIS — M25562 Pain in left knee: Secondary | ICD-10-CM | POA: Diagnosis not present

## 2023-07-30 DIAGNOSIS — Z5181 Encounter for therapeutic drug level monitoring: Secondary | ICD-10-CM

## 2023-07-30 DIAGNOSIS — M19072 Primary osteoarthritis, left ankle and foot: Secondary | ICD-10-CM

## 2023-07-30 DIAGNOSIS — M51369 Other intervertebral disc degeneration, lumbar region without mention of lumbar back pain or lower extremity pain: Secondary | ICD-10-CM | POA: Diagnosis not present

## 2023-07-30 DIAGNOSIS — G4709 Other insomnia: Secondary | ICD-10-CM

## 2023-07-30 DIAGNOSIS — M19042 Primary osteoarthritis, left hand: Secondary | ICD-10-CM

## 2023-07-30 DIAGNOSIS — I25810 Atherosclerosis of coronary artery bypass graft(s) without angina pectoris: Secondary | ICD-10-CM

## 2023-07-30 DIAGNOSIS — G8929 Other chronic pain: Secondary | ICD-10-CM

## 2023-07-30 DIAGNOSIS — I5032 Chronic diastolic (congestive) heart failure: Secondary | ICD-10-CM

## 2023-07-30 NOTE — Progress Notes (Signed)
Jeremy Hendricks    [id number]    March 27, 1956  Primary Care Physician:Greene, Asencion Partridge, MD  Referring Physician: Georgina Quint, MD [address] [place],  [place] [address]  Chief complaint: Consult for abnormal CT scan, chronic pleural effusion  HPI: 67 y.o. who  has a past medical history of Atrial fibrillation (HCC), Chronic diastolic CHF (congestive heart failure) (HCC), CKD (chronic kidney disease), stage II, Coronary artery disease, DJD (degenerative joint disease), Gout, and History of ETT.   Discussed the use of AI scribe software for clinical note transcription with the patient, who gave verbal consent to proceed.  The patient, with a history of two open heart surgeries, presents for follow-up after a recent bout of pneumonia. He initially thought he had severe seasonal allergies, which he believes progressed to bronchitis and then pneumonia. He was treated with antibiotics and prednisone. He reports that his lungs have never been the same since his last open heart surgery 15 years ago. He also mentions that he had fluid drawn from his lungs several weeks to a month after his first open heart surgery. He quit smoking 20 years ago and has no family history of lung disease. He has a hot tub at home, which he keeps clean. He is currently working as a Veterinary surgeon. Despite his recent illness, he was able to participate in a 5K run on Thanksgiving, although he did not perform as well as he would have liked. He also mentions that he sometimes has to prop himself up in bed to prevent wheezing at night.   Pets: No pets Occupation: Veterinary surgeon Exposures: Mold, hot tub, Jacuzzi.  No feather pillows or comforters Smoking history: Quit smoking in 2004 Travel history: Originally from [place].  No significant recent travel Relevant family history: No history of lung disease  Outpatient Encounter Medications as of 07/15/2023  Medication Sig   albuterol (VENTOLIN HFA) 108 (90 Base)  MCG/ACT inhaler Inhale 1-2 puffs into the lungs every 6 (six) hours as needed.   allopurinol (ZYLOPRIM) 300 MG tablet TAKE 1 TABLET BY MOUTH EVERY DAY (Patient taking differently: Patient taking every other day)   atenolol (TENORMIN) 25 MG tablet TAKE 1 TABLET BY MOUTH EVERY DAY   benzonatate (TESSALON) 100 MG capsule Take 1-2 capsules (100-200 mg total) by mouth 3 (three) times daily as needed.   emtricitabine-tenofovir (TRUVADA) 200-300 MG tablet TAKE 1 TABLET BY MOUTH 1 TIME A DAY   furosemide (LASIX) 20 MG tablet Take 1 tablet (20 mg total) by mouth every other day.   HYDROcodone bit-homatropine (HYCODAN) 5-1.5 MG/5ML syrup Take 5 mLs by mouth at bedtime as needed for cough.   MITIGARE 0.6 MG CAPS TAKE 1 CAPSULE BY MOUTH DAILY AS NEEDED FOR GOUT FLARES   Multiple Vitamin (MULTIVITAMIN) tablet Take 1 tablet by mouth daily.   predniSONE (STERAPRED UNI-PAK 21 TAB) 10 MG (21) TBPK tablet 6 day taper; take as directed on package instructions (Patient not taking: Reported on 07/30/2023)   rosuvastatin (CRESTOR) 40 MG tablet TAKE 1 TABLET BY MOUTH EVERY DAY   sertraline (ZOLOFT) 25 MG tablet Take 1 tablet (25 mg total) by mouth daily.   Testosterone 30 MG/ACT SOLN APPLY 1 PUMP EVERY DAY   No facility-administered encounter medications on file as of 07/15/2023.    Allergies as of 07/15/2023   (No Known Allergies)    Past Medical History:  Diagnosis Date   Atrial fibrillation (HCC)    postoperative  Chronic diastolic CHF (congestive heart failure) (HCC)    CKD (chronic kidney disease), stage II    Coronary artery disease    a. s/p CABG 2002. b. s/p redo 2012.   DJD (degenerative joint disease)    Gout    History of ETT    a. ETT 6/16:  normal    Past Surgical History:  Procedure Laterality Date   CORONARY ARTERY BYPASS GRAFT  2002   CORONARY ARTERY BYPASS GRAFT  08-2010   L-LAD remained from original CABG; new grafts incl L radial- PDA + RIMA-RI   VASECTOMY      Family History   Problem Relation Age of Onset   Heart attack Father 59   Hypertension Father    Cancer Mother        Breast cancer   Cancer Other        family hx of   Coronary artery disease Other        family hx of   Hyperlipidemia Other        family hx of   Hypertension Paternal Grandfather    Healthy Son    Healthy Daughter    Stroke Neg Hx    Colon cancer Neg Hx     Social History   Socioeconomic History   Marital status: Single    Spouse name: Not on file   Number of children: 2   Years of education: Not on file   Highest education level: Professional school degree (e.g., MD, DDS, DVM, JD)  Occupational History   Occupation: Realtor  Tobacco Use   Smoking status: Former    Current packs/day: 0.00    Types: Cigarettes    Quit date: 08/14/1999    Years since quitting: 23.9    Passive exposure: Past   Smokeless tobacco: Never   Tobacco comments:    Social smoker  Vaping Use   Vaping status: Never Used  Substance and Sexual Activity   Alcohol use: Yes    Alcohol/week: 14.0 standard drinks of alcohol    Types: 14 Shots of liquor per week    Comment: 1oz-2oz daily    Drug use: No   Sexual activity: Yes  Other Topics Concern   Not on file  Social History Narrative   Single   Education: College   Exercise: Yes   Social Drivers of Health   Financial Resource Strain: Low Risk  (01/02/2023)   Overall Financial Resource Strain (CARDIA)    Difficulty of Paying Living Expenses: Not hard at all  Food Insecurity: No Food Insecurity (01/02/2023)   Hunger Vital Sign    Worried About Running Out of Food in the Last Year: Never true    Ran Out of Food in the Last Year: Never true  Transportation Needs: No Transportation Needs (01/02/2023)   PRAPARE - Administrator, Civil Service (Medical): No    Lack of Transportation (Non-Medical): No  Physical Activity: Sufficiently Active (01/02/2023)   Exercise Vital Sign    Days of Exercise per Week: 5 days    Minutes of  Exercise per Session: 50 min  Stress: No Stress Concern Present (01/02/2023)   [employer] of Occupational Health - Occupational Stress Questionnaire    Feeling of Stress : Only a little  Social Connections: Socially Integrated (01/02/2023)   Social Connection and Isolation Panel [NHANES]    Frequency of Communication with Friends and Family: More than three times a week    Frequency of Social Gatherings with Friends  and Family: Once a week    Attends Religious Services: More than 4 times per year    Active Member of Clubs or Organizations: Yes    Attends Banker Meetings: More than 4 times per year    Marital Status: Living with partner  Intimate Partner Violence: Not At Risk (11/22/2022)   Humiliation, Afraid, Rape, and Kick questionnaire    Fear of Current or Ex-Partner: No    Emotionally Abused: No    Physically Abused: No    Sexually Abused: No    Review of systems: Review of Systems  Constitutional: Negative for fever and chills.  HENT: Negative.   Eyes: Negative for blurred vision.  Respiratory: as per HPI  Cardiovascular: Negative for chest pain and palpitations.  Gastrointestinal: Negative for vomiting, diarrhea, blood per rectum. Genitourinary: Negative for dysuria, urgency, frequency and hematuria.  Musculoskeletal: Negative for myalgias, back pain and joint pain.  Skin: Negative for itching and rash.  Neurological: Negative for dizziness, tremors, focal weakness, seizures and loss of consciousness.  Endo/Heme/Allergies: Negative for environmental allergies.  Psychiatric/Behavioral: Negative for depression, suicidal ideas and hallucinations.  All other systems reviewed and are negative.  Physical Exam: Blood pressure 130/70, pulse (!) 57, height 5\' 7"  (1.702 m), weight 182 lb 3.2 oz (82.6 kg), SpO2 99%. Gen:      No acute distress HEENT:  EOMI, sclera anicteric Neck:     No masses; no thyromegaly Lungs:    Clear to auscultation bilaterally; normal  respiratory effort CV:         Regular rate and rhythm; no murmurs Abd:      + bowel sounds; soft, non-tender; no palpable masses, no distension Ext:    No edema; adequate peripheral perfusion Skin:      Warm and dry; no rash Neuro: alert and oriented x 3 Psych: normal mood and affect  Data Reviewed: Imaging: CT chest 08/24/2017-chronic right pleural effusion with associated pleural thickening, scarring, rounded atelectasis. CT chest 06/25/2023-chronic right effusion with pleural calcifications, stable rounded atelectasis in the right lower lobe, mild patchy opacities in the right upper lobe, bronchial wall thickening I reviewed the images personally.  PFTs:  Labs:  Assessment and Plan Chronic Pleural Effusion Stable chronic fluid collection in the right lung, likely secondary to previous infection or injury. No current symptoms related to this finding. The effusion has been stable since 2019 and is walled off and calcified, indicating a benign process. -Repeat CT scan in 1 year to monitor stability.  Recent Pneumonia Resolved with antibiotics and prednisone. Patient has returned to regular activities, including running a 5K race. Reports occasional nocturnal wheezing, possibly residual inflammation from recent pneumonia. -No further intervention required at this time.  General Health Maintenance / Followup Plans -Follow up in 1 year with repeat CT scan.   Recommendations: Follow-up CT in 1 year  Chilton Greathouse MD  Pulmonary and Critical Care 07/30/2023, 10:11 AM  CC: Edwina Barth [place], *

## 2023-07-31 ENCOUNTER — Telehealth: Payer: Self-pay | Admitting: Family Medicine

## 2023-07-31 LAB — TESTOSTERONE: Testosterone: 624 ng/dL (ref 250–827)

## 2023-07-31 LAB — URIC ACID: Uric Acid, Serum: 7.5 mg/dL (ref 4.0–8.0)

## 2023-07-31 NOTE — Telephone Encounter (Signed)
Recent testosterone level forwarded to me from other specialist..  Looks like his last assessment for testosterone including other labs was in November 2023 - overdue.  Please schedule visit in the next month or 2 if possible and we can review his other medications at that time as well.  Thanks.

## 2023-07-31 NOTE — Progress Notes (Signed)
Testosterone-624--please notify the patient and forward results to PCP.    Uric acid is slightly elevated-7.5.  recommend increasing allopurinol back to 300 mg daily to keep uric acid in a desirable range.

## 2023-08-01 NOTE — Telephone Encounter (Signed)
LM for pt to call back to schedule appt, will also send a MyChart message

## 2023-08-26 ENCOUNTER — Ambulatory Visit: Payer: Medicare PPO | Admitting: Family Medicine

## 2023-08-26 ENCOUNTER — Other Ambulatory Visit (HOSPITAL_COMMUNITY)
Admission: RE | Admit: 2023-08-26 | Discharge: 2023-08-26 | Disposition: A | Discharge status: Home / Self Care | Payer: Medicare PPO | Source: Ambulatory Visit | Attending: Family Medicine | Admitting: Family Medicine

## 2023-08-26 VITALS — BP 128/70 | HR 69 | Temp 98.0°F | Ht 67.0 in | Wt 184.4 lb

## 2023-08-26 DIAGNOSIS — Z5181 Encounter for therapeutic drug level monitoring: Secondary | ICD-10-CM

## 2023-08-26 DIAGNOSIS — Z79899 Other long term (current) drug therapy: Secondary | ICD-10-CM | POA: Diagnosis not present

## 2023-08-26 DIAGNOSIS — E291 Testicular hypofunction: Secondary | ICD-10-CM

## 2023-08-26 DIAGNOSIS — R7989 Other specified abnormal findings of blood chemistry: Secondary | ICD-10-CM

## 2023-08-26 DIAGNOSIS — Z113 Encounter for screening for infections with a predominantly sexual mode of transmission: Secondary | ICD-10-CM | POA: Diagnosis not present

## 2023-08-26 LAB — LIPID PANEL
Cholesterol: 93 mg/dL (ref 0–200)
HDL: 25.1 mg/dL — ABNORMAL LOW (ref >39.00)
LDL Cholesterol: 45 mg/dL (ref 0–99)
NonHDL: 68.3
Total CHOL/HDL Ratio: 4
Triglycerides: 116 mg/dL (ref 0.0–149.0)
VLDL: 23.2 mg/dL (ref 0.0–40.0)

## 2023-08-26 LAB — PSA: PSA: 1.54 ng/mL (ref 0.10–4.00)

## 2023-08-26 MED ORDER — VALACYCLOVIR HCL 500 MG PO TABS: 500.0000 mg | ORAL_TABLET | Freq: Two times a day (BID) | ORAL | 3 refills | Status: DC

## 2023-08-26 MED ORDER — EMTRICITABINE-TENOFOVIR DF 200-300 MG PO TABS: ORAL_TABLET | ORAL | 1 refills | Status: DC

## 2023-08-26 MED ORDER — TESTOSTERONE 30 MG/ACT TD SOLN: TRANSDERMAL | 5 refills | Status: DC

## 2023-08-26 MED ORDER — FUROSEMIDE 20 MG PO TABS: 20.0000 mg | ORAL_TABLET | ORAL | 3 refills | Status: DC

## 2023-08-26 NOTE — Progress Notes (Signed)
 Subjective:  Patient ID: Jeremy Hendricks, male    DOB: [birth year]  Age: 68 y.o. MRN: [id number]  CC:  Chief Complaint  Patient presents with   Medical Management of Chronic Issues    HPI Jeremy Hendricks presents for   Anxiety Discussed in September.  Started on Trintellix  5 mg, but that was denied and instead started on sertraline  25 mg daily.  Effective based on MyChart messages subsequently.  Did have some side effect with decreased libido. Felt like it improved, stopped taking about 6 weeks ago - doing ok now.      08/26/2023    8:22 AM 05/06/2023    3:52 PM 01/22/2020    9:53 AM  GAD 7 : Generalized Anxiety Score  Nervous, Anxious, on Edge 1 1 0  Control/stop worrying 0 1 0  Worry too much - different things 0 1 0  Trouble relaxing 1 1 0  Restless 0 0 0  Easily annoyed or irritable 1 1 0  Afraid - awful might happen 0 1 0  Total GAD 7 Score 3 6 0  Anxiety Difficulty  Somewhat difficult Not difficult at all       08/26/2023    8:22 AM 06/24/2023    3:09 PM 05/06/2023    3:51 PM 11/22/2022    8:44 AM 07/30/2022    2:45 PM  Depression screen PHQ 2/9  Decreased Interest 0 1 1 0 0  Down, Depressed, Hopeless 0 1 1 0 0  PHQ - 2 Score 0 2 2 0 0  Altered sleeping 1 0 0 1 0  Tired, decreased energy 1 1 1  0 0  Change in appetite 0 0 0 0 0  Feeling bad or failure about yourself  0 0 0 0 0  Trouble concentrating 0 0 0 0 0  Moving slowly or fidgety/restless 0 0 0 0 0  Suicidal thoughts 0 0 0 0 0  PHQ-9 Score 2 3 3 1  0  Difficult doing work/chores  Not difficult at all Somewhat difficult Not difficult at all     Hypogonadism Treated with testosterone  supplementation.  Did have a recent level of 624 in December at rheumatology (followed there for gout, treated with allopurinol  with recent increase to 300 mg daily).  Testosterone  30 mg solution 1 pump per day. Lab Results  Component Value Date   ALT 19 06/24/2023   AST 20 06/24/2023   ALKPHOS 53 06/24/2023   BILITOT 0.4  06/24/2023   Lab Results  Component Value Date   CHOL 88 07/09/2022   HDL 23.90 (L) 07/09/2022   LDLCALC 43 07/09/2022   TRIG 109.0 07/09/2022   CHOLHDL 4 07/09/2022   Lab Results  Component Value Date   WBC 19.0 cH (HH) 06/24/2023   HGB 15.3 06/24/2023   HCT 45.9 06/24/2023   MCV 95.6 06/24/2023   PLT 233.0 06/24/2023   Lab Results  Component Value Date   PSA1 1.5 05/23/2020   PSA1 1.4 03/12/2019   PSA1 1.8 04/15/2018   PSA 1.75 07/09/2022   PSA 2.35 12/28/2021   PSA 1.53 06/22/2021   Lab Results  Component Value Date   TESTOSTERONE  624 07/30/2023    Community-acquired pneumonia with pleural effusion Note reviewed from June 24, 2023.  She was treated with azithromycin , then Levaquin .  Possible parapneumonic effusion.  CT chest November 12 with chronic right pleural effusion, possible sequela of prior pneumothorax.  New mild patchy groundglass opacities of right upper lobe and moderate bronchial  wall thickening.  Pulmonary evaluation December 2, Dr. Theophilus.  Plan for 1 year repeat CT scan for chronic pleural effusion.  Clinically improving at that time.  1 year follow-up planned  Cardiac Followed by cardiology, Dr. Verlin, - last visit 12/17/22, 1 year repeat. history of CAD status post CABG in 2002, redo 2012, postop A-fib with chronic diastolic CHF.  Treated with Lasix  20 mg every other day. Atenolol  25mg  every day.   Preexposure prophylaxis for HIV Treated with Truvada  daily.  Did have some slight elevation in creatinine previously, possible component of CKD increase hydration discussed previously.  Most recent creatinine in November looks okay.  STI screening in August. 5 new partners with unprotected intercourse with some unprotected intercourse.  On valtrex  for hx of HSV - uses if needed.  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 s/p CABG 2002. b. s/p redo 2012.   DJD (degenerative joint disease)    Gout    History of ETT    a. ETT 6/16:  normal   Past Surgical History:  Procedure Laterality Date   CORONARY ARTERY BYPASS GRAFT  2002   CORONARY ARTERY BYPASS GRAFT  08-2010   L-LAD remained from original CABG; new grafts incl L radial- PDA + RIMA-RI   VASECTOMY     No Known Allergies Prior to Admission medications   Medication Sig Start Date End Date Taking? Authorizing Provider  albuterol  (VENTOLIN  HFA) 108 (90 Base) MCG/ACT inhaler Inhale 1-2 puffs into the lungs every 6 (six) hours as needed. 06/17/23  Yes Vivienne Nest M, PA-C  allopurinol  (ZYLOPRIM ) 300 MG tablet TAKE 1 TABLET BY MOUTH EVERY DAY Patient taking differently: Patient taking every other day 05/31/23  Yes Deveshwar, Maya, MD  atenolol  (TENORMIN ) 25 MG tablet TAKE 1 TABLET BY MOUTH EVERY DAY 03/01/23  Yes Verlin Lonni BIRCH, MD  diazepam (VALIUM) 5 MG tablet SMARTSIG:1 Tablet(s) By Mouth 06/05/23  Yes [provider]  emtricitabine -tenofovir  (TRUVADA ) 200-300 MG tablet TAKE 1 TABLET BY MOUTH 1 TIME A DAY 05/13/23   Yes Jeremy Jeremy SAUNDERS, MD  furosemide  (LASIX ) 20 MG tablet Take 1 tablet (20 mg total) by mouth every other day. 02/11/23  Yes Verlin Lonni BIRCH, MD  HYDROcodone  bit-homatropine (HYCODAN) 5-1.5 MG/5ML syrup Take 5 mLs by mouth at bedtime as needed for cough. 06/24/23  Yes Sagardia, Emil Schanz, MD  MITIGARE  0.6 MG CAPS TAKE 1 CAPSULE BY MOUTH DAILY AS NEEDED FOR GOUT FLARES  04/22/23  Yes Cheryl Waddell HERO, PA-C  Multiple Vitamin (MULTIVITAMIN) tablet Take 1 tablet by mouth daily.   Yes [provider]  rosuvastatin  (CRESTOR ) 40 MG tablet TAKE 1 TABLET BY MOUTH EVERY DAY 12/19/22  Yes Verlin Lonni BIRCH, MD  sertraline  (ZOLOFT ) 25 MG tablet Take 1 tablet (25 mg total) by mouth daily. 06/06/23  Yes Jeremy Jeremy SAUNDERS, MD  Testosterone  30 MG/ACT SOLN APPLY 1 PUMP EVERY DAY 11/23/22  Yes Jeremy Jeremy SAUNDERS, MD   Social History   Socioeconomic History   Marital status: Single    Spouse name: Not on file   Number of children: 2   Years of education: Not on file   Highest education level: Professional school degree (e.g., MD, DDS, DVM, JD)  Occupational History   Occupation: Realtor  Tobacco Use   Smoking status: Former    Current packs/day: 0.00    Types: Cigarettes    Quit date: 08/14/1999    Years since quitting: 24.0    Passive exposure: Past   Smokeless tobacco: Never   Tobacco comments:    Social smoker  Vaping Use   Vaping status: Never Used  Substance and Sexual Activity   Alcohol use: Yes    Alcohol/week: 14.0 standard drinks of alcohol    Types: 14 Shots of liquor per week    Comment: 1oz-2oz daily    Drug use: No   Sexual activity: Yes  Other Topics Concern   Not on file  Social History Narrative   Single   Education: College   Exercise: Yes   Social Drivers of Health   Financial Resource Strain: Low Risk  (08/22/2023)   Overall Financial Resource Strain (CARDIA)    Difficulty of Paying Living Expenses: Not hard at all  Food Insecurity: No Food Insecurity  (08/22/2023)   Hunger Vital Sign    Worried About Running Out of Food in the Last Year: Never true    Ran Out of Food in the Last Year: Never true  Transportation Needs: No Transportation Needs (08/22/2023)   PRAPARE - Administrator, Civil Service (Medical): No    Lack of Transportation (Non-Medical): No  Physical Activity: Sufficiently Active (08/22/2023)   Exercise Vital Sign    Days of Exercise per Week: 5 days    Minutes of Exercise per Session: 60 min  Stress: No Stress Concern Present (08/22/2023)   [employer] of Occupational Health - Occupational Stress Questionnaire    Feeling of Stress : Only a little  Social Connections: Moderately Integrated (08/22/2023)   Social Connection and Isolation Panel [NHANES]    Frequency of Communication with Friends and Family: More than three times a week    Frequency of Social Gatherings with Friends and Family: More than three times a week    Attends Religious Services: Never    Database Administrator or Organizations: Yes    Attends Engineer, Structural: More than 4 times per year    Marital Status: Living with partner  Intimate Partner Violence: Not At Risk (11/22/2022)   Humiliation, Afraid, Rape, and Kick questionnaire    Fear of Current or Ex-Partner: No    Emotionally Abused: No    Physically Abused: No    Sexually Abused: No    Review of Systems  Constitutional:  Negative for fatigue and unexpected weight change.  Eyes:  Negative for visual disturbance.  Respiratory:  Negative for cough, chest tightness and shortness of breath.   Cardiovascular:  Negative  for chest pain, palpitations and leg swelling.  Gastrointestinal:  Negative for abdominal pain and blood in stool.  Genitourinary:  Negative for genital sores and penile discharge.  Neurological:  Negative for dizziness, light-headedness and headaches.     Objective:   Vitals:   08/26/23 0819  BP: 128/70  Pulse: 69  Temp: 98 F (36.7 C)  TempSrc:  Temporal  SpO2: 97%  Weight: 184 lb 6.4 oz (83.6 kg)  Height: 5' 7 (1.702 m)     Physical Exam Vitals reviewed.  Constitutional:      Appearance: He is well-developed.  HENT:     Head: Normocephalic and atraumatic.  Neck:     Vascular: No carotid bruit or JVD.  Cardiovascular:     Rate and Rhythm: Normal rate and regular rhythm.     Heart sounds: Normal heart sounds. No murmur heard. Pulmonary:     Effort: Pulmonary effort is normal.     Breath sounds: Normal breath sounds. No rales.  Musculoskeletal:     Right lower leg: No edema.     Left lower leg: No edema.  Skin:    General: Skin is warm and dry.  Neurological:     Mental Status: He is alert and oriented to person, place, and time.  Psychiatric:        Mood and Affect: Mood normal.        Assessment & Plan:  Jeremy Hendricks is a 68 y.o. male . Medication monitoring encounter - Plan: Lipid panel, PSA Routine screening for STI (sexually transmitted infection) - Plan: emtricitabine -tenofovir  (TRUVADA ) 200-300 MG tablet, Urine cytology ancillary only, HIV Antibody (routine testing w rflx), RPR On pre-exposure prophylaxis for HIV - Plan: emtricitabine -tenofovir  (TRUVADA ) 200-300 MG tablet  -STI screening option every 3 months if new unprotected partners.  Lab only visit as option.  50-month appointment for physical.  Continue Truvada .  -Valtrex  refilled if needed for HSV.  Recent visit with rheumatology and pulmonary noted, prior pneumonia has improved.  Keep follow-up with cardiology as planned, asymptomatic at this time, refilled every other day Lasix .  Should have refill of cardiac meds based on chart.  Will call if needed prior to his next visit.  Low testosterone  - Plan: PSA, Testosterone  30 MG/ACT SOLN Hypogonadism in male - Plan: PSA, Testosterone  30 MG/ACT SOLN  -Tolerating current dose of testosterone  with recent level noted.  Check monitoring labs above including PSA and lipid panel.  Meds ordered this  encounter  Medications   valACYclovir  (VALTREX ) 500 MG tablet    Sig: Take 1 tablet (500 mg total) by mouth 2 (two) times daily.    Dispense:  14 tablet    Refill:  3   furosemide  (LASIX ) 20 MG tablet    Sig: Take 1 tablet (20 mg total) by mouth every other day.    Dispense:  45 tablet    Refill:  3   emtricitabine -tenofovir  (TRUVADA ) 200-300 MG tablet    Sig: TAKE 1 TABLET BY MOUTH 1 TIME A DAY    Dispense:  90 tablet    Refill:  1   Testosterone  30 MG/ACT SOLN    Sig: APPLY 1 PUMP EVERY DAY    Dispense:  90 mL    Refill:  5    This request is for a new prescription for a controlled substance as required by Federal/State law.NEED REFILL.   Patient Instructions  Thanks for coming in today.  No medication changes at this time.  If any concerns on  labs I will let you know.  As we discussed you have the option of STI screening at 58-month intervals and I can put in a lab only order if you would like for that next testing, then follow-up with me in 6 months.  Please let me know if there are questions on labs or medications and have a great trip to [place] in the spring.  Take care!    Signed,   Jeremy Pines, MD [facility], [facility] 08/26/23 9:02 AM

## 2023-08-26 NOTE — Patient Instructions (Signed)
 Thanks for coming in today.  No medication changes at this time.  If any concerns on labs I will let you know.  As we discussed you have the option of STI screening at 11-month intervals and I can put in a lab only order if you would like for that next testing, then follow-up with me in 6 months.  Please let me know if there are questions on labs or medications and have a great trip to [place] in the spring.  Take care!

## 2023-08-27 LAB — RPR: RPR Ser Ql: NONREACTIVE

## 2023-08-27 LAB — HIV ANTIBODY (ROUTINE TESTING W REFLEX): HIV 1&2 Ab, 4th Generation: NONREACTIVE

## 2023-08-28 ENCOUNTER — Encounter: Payer: Self-pay | Admitting: Family Medicine

## 2023-08-28 ENCOUNTER — Other Ambulatory Visit: Payer: Medicare PPO

## 2023-08-29 LAB — URINE CYTOLOGY ANCILLARY ONLY
Chlamydia: NEGATIVE
Comment: NEGATIVE
Comment: NEGATIVE
Comment: NORMAL
Neisseria Gonorrhea: NEGATIVE
Trichomonas: NEGATIVE

## 2023-09-13 ENCOUNTER — Encounter: Payer: Self-pay | Admitting: Family Medicine

## 2023-09-16 ENCOUNTER — Telehealth: Payer: Self-pay | Admitting: Pharmacy Technician

## 2023-09-16 ENCOUNTER — Other Ambulatory Visit (HOSPITAL_COMMUNITY): Payer: Self-pay

## 2023-09-16 NOTE — Telephone Encounter (Signed)
Pharmacy Patient Advocate Encounter   Received notification from CoverMyMeds that prior authorization for Testosterone 30MG /ACT solution is required/requested.   Insurance verification completed.   The patient is insured through [employer] .   Per test claim: PA required; PA submitted to above mentioned insurance via CoverMyMeds Key/confirmation #/EOC [id number] Status is pending

## 2023-09-16 NOTE — Telephone Encounter (Signed)
Patient should need PA on Testosterone for the new year

## 2023-09-18 ENCOUNTER — Other Ambulatory Visit (HOSPITAL_COMMUNITY): Payer: Self-pay

## 2023-09-18 NOTE — Telephone Encounter (Signed)
 Pharmacy Patient Advocate Encounter  Received notification from [employer] that Prior Authorization for Testosterone  30MG /ACT solution  has been APPROVED from 08/14/2023 to 08/12/2024. Unable to obtain price due to refill too soon rejection, last fill date 09/17/2023 next available fill date 10/10/2023   PA #/Case ID/Reference #: [id number]

## 2023-09-23 ENCOUNTER — Encounter: Payer: Self-pay | Admitting: Family Medicine

## 2023-09-23 ENCOUNTER — Ambulatory Visit: Payer: Medicare PPO | Admitting: Family Medicine

## 2023-09-23 VITALS — BP 114/66 | HR 66 | Temp 98.2°F | Ht 67.0 in | Wt 180.6 lb

## 2023-09-23 DIAGNOSIS — R12 Heartburn: Secondary | ICD-10-CM | POA: Diagnosis not present

## 2023-09-23 DIAGNOSIS — J111 Influenza due to unidentified influenza virus with other respiratory manifestations: Secondary | ICD-10-CM | POA: Diagnosis not present

## 2023-09-23 DIAGNOSIS — R079 Chest pain, unspecified: Secondary | ICD-10-CM | POA: Diagnosis not present

## 2023-09-23 DIAGNOSIS — D235 Other benign neoplasm of skin of trunk: Secondary | ICD-10-CM | POA: Diagnosis not present

## 2023-09-23 DIAGNOSIS — R051 Acute cough: Secondary | ICD-10-CM

## 2023-09-23 LAB — POCT INFLUENZA A/B
Influenza A, POC: POSITIVE — AB
Influenza B, POC: NEGATIVE

## 2023-09-23 LAB — POC COVID19 BINAXNOW: SARS Coronavirus 2 Ag: NEGATIVE

## 2023-09-23 MED ORDER — HYDROCODONE BIT-HOMATROP MBR 5-1.5 MG/5ML PO SOLN: 5.0000 mL | Freq: Every evening | ORAL | 0 refills | Status: DC | PRN

## 2023-09-23 NOTE — Patient Instructions (Signed)
 Unfortunately you do have influenza, but with the sweating this morning and possible breaking of the fever, I suspect you will be improving over the next few days.  Tylenol  can be used for body aches, fever.  Make sure to drink plenty of fluids.  Mucinex is fine for cough during the day or the Tessalon  Perles.  Hydrocodone  cough syrup was refilled if needed especially at bedtime.  If you are having any worsening shortness of breath, or other worsening symptoms be seen as other testing may be needed.  I expect you to be improving in the next few days, not worsening.  Return to the clinic or go to the nearest emergency room if any of your symptoms worsen or new symptoms occur.  Area under the right axilla/armpit appears to be a benign skin lesion but I want to recheck that area if it has not resolved in the next 2 weeks.  Start Pepcid twice per day for heartburn, but I would like you to call your cardiologist to follow-up on the chest discomfort with exercise.  Given your heart history I think it would be best to avoid any exertional exercise until you are seen by cardiology.  Hope you feel better soon.  Hang in there!   Influenza, Adult Influenza is also called the flu. It's an infection that affects your respiratory tract. This includes your nose, throat, windpipe, and lungs. The flu is contagious. This means it spreads easily from person to person. It causes symptoms that are like a cold. It can also cause a high fever and body aches. What are the causes? The flu is caused by the influenza virus. You can get it by: Breathing in droplets that are in the air after an infected person coughs or sneezes. Touching something that has the virus on it and then touching your mouth, nose, or eyes. What increases the risk? You may be more likely to get the flu if: You don't wash your hands often. You're near a lot of people during cold and flu season. You touch your mouth, eyes, or nose without washing  your hands first. You don't get a flu shot each year. You may also be more at risk for the flu and serious problems, such as a lung infection called pneumonia, if: You're older than 65. You're pregnant. Your immune system is weak. Your immune system is your body's defense system. You have a long-term, or chronic, condition, such as: Heart, kidney, or lung disease. Diabetes. A liver disorder. Asthma. You're very overweight. You have anemia. This is when you don't have enough red blood cells in your body. What are the signs or symptoms? Flu symptoms often start all of a sudden. They may last 4-14 days and include: Fever and chills. Headaches, body aches, or muscle aches. Sore throat. Cough. Runny or stuffy nose. Discomfort in your chest. Not wanting to eat as much as normal. Feeling weak or tired. Feeling dizzy. Nausea or vomiting. How is this diagnosed? The flu may be diagnosed based on your symptoms and medical history. You may also have a physical exam. A swab may be taken from your nose or throat and tested for the virus. How is this treated? If the flu is found early, you can be treated with antiviral medicine. This may be given to you by mouth or through an IV. It can help you feel less sick and get better faster. Taking care of yourself at home can also help your symptoms get better. Your health  care provider may tell you to: Take over-the-counter medicines. Drink lots of fluids. The flu often goes away on its own. If you have very bad symptoms or problems caused by the flu, you may need to be treated in a hospital. Follow these instructions at home: Activity Rest as needed. Get lots of sleep. Stay home from work or school as told by your provider. Leave home only to go see your provider. Do not leave home for other reasons until you don't have a fever for 24 hours without taking medicine. Eating and drinking Take an oral rehydration solution (ORS). This is a drink that  is sold at pharmacies and stores. Drink enough fluid to keep your pee pale yellow. Try to drink small amounts of clear fluids. These include water, ice chips, fruit juice mixed with water, and low-calorie sports drinks. Try to eat bland foods that are easy to digest. These include bananas, applesauce, rice, lean meats, toast, and crackers. Avoid drinks that have a lot of sugar or caffeine in them. These include energy drinks, regular sports drinks, and soda. Do not drink alcohol. Do not eat spicy or fatty foods. General instructions     Take your medicines only as told by your provider. Use a cool mist humidifier to add moisture to the air in your home. This can make it easier for you to breathe. You should also clean the humidifier every day. To do so: Empty the water. Pour clean water in. Cover your mouth and nose when you cough or sneeze. Wash your hands with soap and water often and for at least 20 seconds. It's extra important to do so after you cough or sneeze. If you can't use soap and water, use hand sanitizer. How is this prevented?  Get a flu shot every year. Ask your provider when you should get your flu shot. Stay away from people who are sick during fall and winter. Fall and winter are cold and flu season. Contact a health care provider if: You get new symptoms. You have chest pain. You have watery poop, also called diarrhea. You have a fever. Your cough gets worse. You start to have more mucus. You feel like you may vomit, or you vomit. Get help right away if: You become short of breath or have trouble breathing. Your skin or nails turn blue. You have very bad pain or stiffness in your neck. You get a sudden headache or pain in your face or ear. You vomit each time you eat or drink. These symptoms may be an emergency. Call 911 right away. Do not wait to see if the symptoms will go away. Do not drive yourself to the hospital. This information is not intended to  replace advice given to you by your health care provider. Make sure you discuss any questions you have with your health care provider. Document Revised: 05/02/2023 Document Reviewed: 09/06/2022 Elsevier Patient Education  2024 [employer].

## 2023-09-23 NOTE — Progress Notes (Signed)
 Subjective:  Patient ID: Jeremy Hendricks, male    DOB: November 02, 1955  Age: 68 y.o. MRN: [id number]  CC:  Chief Complaint  Patient presents with   Cough    Pt notes cough, productive cough, saw a sick client Thursday, started feeling unwell Friday, sinus drainage, productive cough, Neg COVID test this weekend, pt notes has been taking an old Rx of Hycodan cough syrup AND Tessalon  Pearls that seems to work    Mass    Pt notes a lump the size of a Pea under Lt arm, notes been there about a week, somewhat painful, has refrained from poking or prodding at lump     HPI Jeremy Hendricks presents for 2 concerns above  Cough: Started 3 days ago with sinus drainage, productive cough, sick contact day prior.  Negative home COVID testing. Fever and chills last night. Fever broke overnight with sweats.  Drinking fluids.  Decreased appetite.  No new chest pain with current illness.  Feels short of breath - worse with coughing fits.  Treatment:  Tessalon  Perles - min relief, previous prescription of Hycodan cough syrup which has been effective - every 4 hours. Min relief with dayquil.  Mass of right axilla Pea-sized lump under the left arm, past week, somewhat painful.  No skin redness or discharge.  Acute illness as above. No prior similar sx's.   Chest pain with exercise: Noted during hx discussed with acute issue above. Few month hx of left sided chest pain with cardio exercise. Only when running or jogging. No left arm radiation or neck radiation. Possible associated shortness of breath.  No pressure or heaviness. Improves with rest. Notices burning sensation primarily with some belching - not noticing after meals.  Has been using more tums past 6-8 months - more indigestion.  No melena/hematochezia.   Cardiologist - Dr. Abel Hoe. Last visit 12/2022. Hx of CAD, CABG in 2002 and redo in 2012. On lasix  with chronic diastolic CHF, treated with  lasix , asa, statin, betablocker.  Echo March 2022 - normal  LV function, no valve disease per 12/2022 cardiology note.   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 s/p CABG 2002. b. s/p redo 2012.   DJD (degenerative joint disease)    Gout    History of ETT    a. ETT 6/16:  normal   Past Surgical History:  Procedure Laterality Date   CORONARY ARTERY BYPASS GRAFT  2002   CORONARY ARTERY BYPASS GRAFT  08-2010   L-LAD remained from original CABG; new grafts  incl L radial- PDA + RIMA-RI   VASECTOMY     No Known Allergies Prior to Admission medications   Medication Sig Start Date End Date Taking? Authorizing Provider  albuterol  (VENTOLIN  HFA) 108 (90 Base) MCG/ACT inhaler Inhale 1-2 puffs into the lungs every 6 (six) hours as needed. 06/17/23  Yes Nellie Banas M, PA-C  allopurinol  (ZYLOPRIM ) 300 MG tablet TAKE 1 TABLET BY MOUTH EVERY DAY Patient taking differently: Patient taking every other day 05/31/23  Yes Deveshwar, Clydie Darter, MD  atenolol  (TENORMIN ) 25 MG tablet TAKE 1 TABLET BY MOUTH EVERY DAY 03/01/23  Yes Odie Benne, MD  diazepam (VALIUM) 5 MG  tablet SMARTSIG:1 Tablet(s) By Mouth 06/05/23  Yes [provider]  emtricitabine -tenofovir  (TRUVADA ) 200-300 MG tablet TAKE 1 TABLET BY MOUTH 1 TIME A DAY 08/26/23  Yes Benjiman Bras, MD  furosemide  (LASIX ) 20 MG tablet Take 1 tablet (20 mg total) by mouth every other day. 08/26/23  Yes Benjiman Bras, MD  HYDROcodone  bit-homatropine [facility] 5-1.5 MG/5ML syrup Take 5 mLs by mouth at bedtime as needed for cough. 06/24/23  Yes Elvira Hammersmith, MD  MITIGARE  0.6 MG CAPS TAKE 1 CAPSULE BY MOUTH DAILY AS NEEDED FOR GOUT FLARES 04/22/23  Yes Romayne Clubs, PA-C  Multiple Vitamin (MULTIVITAMIN) tablet Take 1 tablet by mouth daily.   Yes [provider]  rosuvastatin  (CRESTOR ) 40 MG tablet TAKE 1 TABLET BY MOUTH EVERY DAY 12/19/22  Yes Odie Benne, MD  sertraline  (ZOLOFT ) 25 MG tablet Take 1 tablet (25 mg total) by mouth daily. 06/06/23  Yes Benjiman Bras, MD  Testosterone  30 MG/ACT SOLN APPLY 1 PUMP EVERY DAY 08/26/23  Yes Benjiman Bras, MD  valACYclovir  (VALTREX ) 500 MG tablet Take 1 tablet (500 mg total) by mouth 2 (two) times daily. 08/26/23  Yes Benjiman Bras, MD   Social History   Socioeconomic History   Marital status: Single    Spouse name: Not on file   Number of children: 2   Years of education: Not on file   Highest education level: Professional school degree (e.g., MD, DDS, DVM, JD)  Occupational History   Occupation: Realtor  Tobacco Use   Smoking status: Former    Current packs/day: 0.00    Types: Cigarettes    Quit date: 08/14/1999    Years since quitting: 24.1    Passive exposure: Past   Smokeless tobacco: Never   Tobacco comments:    Social smoker  Vaping Use   Vaping status: Never Used  Substance and Sexual Activity   Alcohol use: Yes    Alcohol/week: 14.0 standard drinks of alcohol    Types: 14 Shots of liquor per week    Comment: 1oz-2oz daily    Drug use: No   Sexual activity: Yes  Other Topics Concern   Not on file   Social History Narrative   Single   Education: College   Exercise: Yes   Social Drivers of Health   Financial Resource Strain: Low Risk  (08/22/2023)   Overall Financial Resource Strain (CARDIA)    Difficulty of Paying Living Expenses: Not hard at all  Food Insecurity: No Food Insecurity (08/22/2023)   Hunger Vital Sign    Worried About Running Out of Food in the Last Year: Never true    Ran Out of Food in the Last Year: Never true  Transportation Needs: No Transportation Needs (08/22/2023)   PRAPARE - Administrator, Civil Service (Medical): No  Lack of Transportation (Non-Medical): No  Physical Activity: Sufficiently Active (08/22/2023)   Exercise Vital Sign    Days of Exercise per Week: 5 days    Minutes of Exercise per Session: 60 min  Stress: No Stress Concern Present (08/22/2023)   [employer] of Occupational Health - Occupational Stress Questionnaire    Feeling of Stress : Only a little  Social Connections: Moderately Integrated (08/22/2023)   Social Connection and Isolation Panel [NHANES]    Frequency of Communication with Friends and Family: More than three times a week    Frequency of Social Gatherings with Friends and Family: More than three times a week    Attends Religious Services: Never    Database administrator or Organizations: Yes    Attends Engineer, structural: More than 4 times per year    Marital Status: Living with partner  Intimate Partner Violence: Not At Risk (11/22/2022)   Humiliation, Afraid, Rape, and Kick questionnaire    Fear of Current or Ex-Partner: No    Emotionally Abused: No    Physically Abused: No    Sexually Abused: No    Review of Systems Per HPI.   Objective:   Vitals:   09/23/23 1135  BP: 114/66  Pulse: 66  Temp: 98.2 F (36.8 C)  SpO2: 95%  Weight: 180 lb 9.6 oz (81.9 kg)  Height: 5\' 7"  (1.702 m)   Ambulatory pulse ox 95% with a nadir of 94.   Physical Exam Vitals reviewed.  Constitutional:       Appearance: He is well-developed.  HENT:     Head: Normocephalic and atraumatic.     Right Ear: Tympanic membrane, ear canal and external ear normal.     Left Ear: Tympanic membrane, ear canal and external ear normal.     Nose: No rhinorrhea.     Mouth/Throat:     Pharynx: No oropharyngeal exudate or posterior oropharyngeal erythema.  Eyes:     Conjunctiva/sclera: Conjunctivae normal.     Pupils: Pupils are equal, round, and reactive to light.  Cardiovascular:     Rate and Rhythm: Normal rate and regular rhythm.     Heart sounds: Normal heart sounds. No murmur heard. Pulmonary:     Effort: Pulmonary effort is normal.     Breath sounds: No wheezing or rales.     Comments: Speaking in full sentences without respiratory distress.  Few distant coarse breath sounds but overall good air exchange.  Chest wall nontender. Abdominal:     Palpations: Abdomen is soft.     Tenderness: There is no abdominal tenderness.  Musculoskeletal:     Cervical back: Neck supple.  Lymphadenopathy:     Cervical: No cervical adenopathy.  Skin:    General: Skin is warm and dry.     Findings: No rash.       Neurological:     Mental Status: He is alert and oriented to person, place, and time.  Psychiatric:        Behavior: Behavior normal.      Results for orders placed or performed in visit on 09/23/23  POC COVID-19 BinaxNow   Collection Time: 09/23/23 11:47 AM  Result Value Ref Range   SARS Coronavirus 2 Ag Negative Negative  POCT Influenza A/B   Collection Time: 09/23/23 11:47 AM  Result Value Ref Range   Influenza A, POC Positive (A) Negative   Influenza B, POC Negative Negative     Assessment & Plan:  Jeremy Hendricks is  a 68 y.o. male . Acute cough - Plan: POC COVID-19 BinaxNow, POCT Influenza A/B, HYDROcodone  bit-homatropine (HYCODAN) 5-1.5 MG/5ML syrup Influenza  -Day 3-4 of influenza.  Ambulatory pulse ox stable.  Few coarse breath sounds upon exam but overall good air exchange.  Peers  to be stable for outpatient treatment at this time, beyond time of Tamiflu.  Symptomatic care discussed.  Mucinex, Tessalon  Perles, fluids, rest, Hycodan cough syrup if needed with RTC/ER precautions.  I would like to see his symptoms improved in the next few days, if not consider imaging, repeat visit.  RTC precautions given.  Left-sided chest pain Heartburn  -Past few months with exercise, but describes burning sensation, increased belching with heartburn symptoms requiring Tums previously.  Likely reflux, but the exertional nature with his history of CAD is concerning.  Start Pepcid, avoid exertional exercise for now and advised to call cardiology for follow-up and discussion of the symptoms further.  Benign neoplasm of skin of axilla  -Possible skin tag versus other benign lesion on right axilla.  Will monitor for changes next few weeks, follow-up if increasing in size or not resolving.  Meds ordered this encounter  Medications   HYDROcodone  bit-homatropine (HYCODAN) 5-1.5 MG/5ML syrup    Sig: Take 5 mLs by mouth at bedtime as needed for cough.    Dispense:  120 mL    Refill:  0   Patient Instructions  Unfortunately you do have influenza, but with the sweating this morning and possible breaking of the fever, I suspect you will be improving over the next few days.  Tylenol  can be used for body aches, fever.  Make sure to drink plenty of fluids.  Mucinex is fine for cough during the day or the Tessalon  Perles.  Hydrocodone  cough syrup was refilled if needed especially at bedtime.  If you are having any worsening shortness of breath, or other worsening symptoms be seen as other testing may be needed.  I expect you to be improving in the next few days, not worsening.  Return to the clinic or go to the nearest emergency room if any of your symptoms worsen or new symptoms occur.  Area under the right axilla/armpit appears to be a benign skin lesion but I want to recheck that area if it has not  resolved in the next 2 weeks.  Start Pepcid twice per day for heartburn, but I would like you to call your cardiologist to follow-up on the chest discomfort with exercise.  Given your heart history I think it would be best to avoid any exertional exercise until you are seen by cardiology.  Hope you feel better soon.  Hang in there!   Influenza, Adult Influenza is also called the flu. It's an infection that affects your respiratory tract. This includes your nose, throat, windpipe, and lungs. The flu is contagious. This means it spreads easily from person to person. It causes symptoms that are like a cold. It can also cause a high fever and body aches. What are the causes? The flu is caused by the influenza virus. You can get it by: Breathing in droplets that are in the air after an infected person coughs or sneezes. Touching something that has the virus on it and then touching your mouth, nose, or eyes. What increases the risk? You may be more likely to get the flu if: You don't wash your hands often. You're near a lot of people during cold and flu season. You touch your mouth, eyes, or nose  without washing your hands first. You don't get a flu shot each year. You may also be more at risk for the flu and serious problems, such as a lung infection called pneumonia, if: You're older than 65. You're pregnant. Your immune system is weak. Your immune system is your body's defense system. You have a long-term, or chronic, condition, such as: Heart, kidney, or lung disease. Diabetes. A liver disorder. Asthma. You're very overweight. You have anemia. This is when you don't have enough red blood cells in your body. What are the signs or symptoms? Flu symptoms often start all of a sudden. They may last 4-14 days and include: Fever and chills. Headaches, body aches, or muscle aches. Sore throat. Cough. Runny or stuffy nose. Discomfort in your chest. Not wanting to eat as much as  normal. Feeling weak or tired. Feeling dizzy. Nausea or vomiting. How is this diagnosed? The flu may be diagnosed based on your symptoms and medical history. You may also have a physical exam. A swab may be taken from your nose or throat and tested for the virus. How is this treated? If the flu is found early, you can be treated with antiviral medicine. This may be given to you by mouth or through an IV. It can help you feel less sick and get better faster. Taking care of yourself at home can also help your symptoms get better. Your health care provider may tell you to: Take over-the-counter medicines. Drink lots of fluids. The flu often goes away on its own. If you have very bad symptoms or problems caused by the flu, you may need to be treated in a hospital. Follow these instructions at home: Activity Rest as needed. Get lots of sleep. Stay home from work or school as told by your provider. Leave home only to go see your provider. Do not leave home for other reasons until you don't have a fever for 24 hours without taking medicine. Eating and drinking Take an oral rehydration solution (ORS). This is a drink that is sold at pharmacies and stores. Drink enough fluid to keep your pee pale yellow. Try to drink small amounts of clear fluids. These include water, ice chips, fruit juice mixed with water, and low-calorie sports drinks. Try to eat bland foods that are easy to digest. These include bananas, applesauce, rice, lean meats, toast, and crackers. Avoid drinks that have a lot of sugar or caffeine in them. These include energy drinks, regular sports drinks, and soda. Do not drink alcohol. Do not eat spicy or fatty foods. General instructions     Take your medicines only as told by your provider. Use a cool mist humidifier to add moisture to the air in your home. This can make it easier for you to breathe. You should also clean the humidifier every day. To do so: Empty the  water. Pour clean water in. Cover your mouth and nose when you cough or sneeze. Wash your hands with soap and water often and for at least 20 seconds. It's extra important to do so after you cough or sneeze. If you can't use soap and water, use hand sanitizer. How is this prevented?  Get a flu shot every year. Ask your provider when you should get your flu shot. Stay away from people who are sick during fall and winter. Fall and winter are cold and flu season. Contact a health care provider if: You get new symptoms. You have chest pain. You have watery poop, also  called diarrhea. You have a fever. Your cough gets worse. You start to have more mucus. You feel like you may vomit, or you vomit. Get help right away if: You become short of breath or have trouble breathing. Your skin or nails turn blue. You have very bad pain or stiffness in your neck. You get a sudden headache or pain in your face or ear. You vomit each time you eat or drink. These symptoms may be an emergency. Call 911 right away. Do not wait to see if the symptoms will go away. Do not drive yourself to the hospital. This information is not intended to replace advice given to you by your health care provider. Make sure you discuss any questions you have with your health care provider. Document Revised: 05/02/2023 Document Reviewed: 09/06/2022 Elsevier Patient Education  2024 Elsevier Inc.    Signed,   Caro Christmas, MD [facility], [facility] 09/23/23 12:50 PM

## 2023-09-25 ENCOUNTER — Telehealth: Payer: Self-pay | Admitting: Cardiovascular Disease

## 2023-09-25 ENCOUNTER — Encounter: Payer: Self-pay | Admitting: Cardiovascular Disease

## 2023-09-25 NOTE — Telephone Encounter (Signed)
Pt called to report that for about a minth has has noticed that he has been having some left sided chest pain, sharp in nature when he is exerting himself... he has noticed that it is happening with more minimal exertion. He has not had to take a nitroglycerin because he says it is relived with rest easily... he denies that the pain radiales, no n/v and no dizziness or SOB.   He has had the Flu for 5 days so he has not been exerting himself and has ben doing well.   I have been unable to find him on OV with the provider and APP in the near future.   I advised the pt I will send to Dr. Clifton James and his nurse to see if appropriate to plan testing prior to seeing him per his request or if there is a cancellation where we can get him in to be seen.   I also advised him strongly that he may need to plan to have someone take him to the ED if his symptoms reoccur and/ or worsen and any new symptoms and he agreed.   Pt had Redo CABG in 08/2010.

## 2023-09-25 NOTE — Telephone Encounter (Signed)
error 

## 2023-09-25 NOTE — Telephone Encounter (Signed)
  Per MyChart scheduling message:  Pt c/o of Chest Pain: STAT if active CP, including tightness, pressure, jaw pain, radiating pain to shoulder/upper arm/back, CP unrelieved by Nitro. Symptoms reported of SOB, nausea, vomiting, sweating.  1. Are you having CP right now?    2. Are you experiencing any other symptoms (ex. SOB, nausea, vomiting, sweating)?   3. Is your CP continuous or coming and going?   4. Have you taken Nitroglycerin?   5. How long have you been experiencing CP?    6. If NO CP at time of call then end call with telling Pt to call back or call 911 if Chest pain returns prior to return call from triage team.   The chest pain appears when I have been running for a few minutes (so not now) I do sweat profusely when I run and unsure whether or not it is more so The pain is pretty much continuous, burning and rises from lower to upper chest left side, disipates shortly after running No nitro while running Last month or so with running (pain)

## 2023-09-26 ENCOUNTER — Encounter: Payer: Self-pay | Admitting: Family Medicine

## 2023-09-26 NOTE — Telephone Encounter (Signed)
At the same time as the chest pain he has had "severe heartburn" this was relieved by tums  Reports a lot of belching - attributes to "horrible diet" eats spicy things a lot close to bedtime.   Per patient: the concern is recently he ran a marathon (Nov) and his time was really awful.  He had pretty much stopped jogging at the gym.  Recently when he would change from walk to run at gym he noticed burning pain on left side of chest.  Did not radiate.  Happened three times.  First bypass surgery he had severe sudden heaviness, and a lot of belching and pressure leading up to that day.  Again provided ER precautions.  He is really sick with the flu.  He is going to message primary care requesting steroids after this call.

## 2023-09-27 NOTE — Telephone Encounter (Signed)
Patient reports minimal change and requests steroid   Please advise

## 2023-09-27 NOTE — Telephone Encounter (Signed)
Called patient - feeling better this morning.  Less short of breath, was actually at a store when I called.  He is feeling better, and would prefer to avoid any changes at this time.  I did discuss with his medical history and with prior abnormalities on CT scan that if he has any increasing shortness of breath or worsening symptoms should be evaluated in person either through urgent care or ER this weekend.  Understanding expressed.

## 2023-09-27 NOTE — Telephone Encounter (Signed)
Kathleene Hazel, MD  You2 hours ago (9:21 AM)    We could have him see Consuella Lose next week if he is feeling better from the flu. If not, I can see him on the 27th.  ___________________________________________________  Messaged patient, awaiting reply.

## 2023-09-28 NOTE — Progress Notes (Unsigned)
 Cardiology Office Note:   Date:  10/04/2023  ID:  Jeremy Hendricks, DOB [birth year], MRN [id number] PCP:  Jeremy Flood, MD  [facility] Providers Cardiologist:  Earney Hamburg, MD DOD Cardiologist Alverda Skeans, MD Referring MD: Jeremy Flood, MD  Chief Complaint/Reason for Referral: DOD visit for possible angina  ASSESSMENT:    1. Precordial pain   2. S/P CABG x 3   3. Hyperlipidemia LDL goal <70   4. Aortic atherosclerosis (HCC)   5. Primary hypertension   6. Stage 3a chronic kidney disease (HCC)   7. BMI 28.0-28.9,adult     PLAN:   In order of problems listed above: Chest pain: Patient has atypical chest pain/GERD versus stable anginal symptoms.  Will start pantoprazole 20 mg, change atenolol to Toprol 25 mg at bedtime, start Imdur 30 mg at bedtime and as needed nitroglycerin.  Will refer for PET stress test.  Will have patient follow-up with Dr. Clifton James after PET stress test.  If high risk ischemia or medically refractory chest pain will need to consider coronary angiography.  Discussed with patient who agrees with plan. History of CABG: Start aspirin 81mg , continue atenolol 25mg  daily, continue  Crestor 40 mg daily, prescribed as needed nitroglycerin. Hyperlipidemia: LDL recently was at goal. Aortic atherosclerosis: Continue aspirin 81 mg daily and continue Crestor 40 mg daily. Hypertension:  BP well controlled today. Stage IIIa chronic kidney disease: Continue strict blood pressure control.  Consider ACE inhibitor or ARB for renal protection. Elevated BMI: Diet and exercise modification once acute issues have been addressed.       Informed Consent   Shared Decision Making/Informed Consent The risks [chest pain, shortness of breath, cardiac arrhythmias, dizziness, blood pressure fluctuations, myocardial infarction, stroke/transient ischemic attack, nausea, vomiting, allergic reaction, radiation exposure, metallic taste sensation and life-threatening complications  (estimated to be 1 in 10,000)], benefits (risk stratification, diagnosing coronary artery disease, treatment guidance) and alternatives of a cardiac PET stress test were discussed in detail with Jeremy Hendricks and he agrees to proceed.      Dispo: With Dr. Clifton James after PET stress test.     Medication Adjustments/Labs and Tests Ordered: Current medicines are reviewed at length with the patient today.  Concerns regarding medicines are outlined above.  The following changes have been made:     Labs/tests ordered: Orders Placed This Encounter  Procedures   EKG 12-Lead    Medication Changes: No orders of the defined types were placed in this encounter.   Current medicines are reviewed at length with the patient today.  The patient does not have concerns regarding medicines.  I spent 33 minutes reviewing all clinical data during and prior to this visit including all relevant imaging studies, laboratories, clinical information from other health systems and prior notes from both Cardiology and other specialties, interviewing the patient, conducting a complete physical examination, and coordinating care in order to formulate a comprehensive and personalized evaluation and treatment plan.   History of Present Illness:      [name], [name], [name], syncope palpitations, paroxysmal nocturnal dyspnea, orthopnea.  He has mild peripheral edema ever since his bypass surgery.  He has not required any emergency room visits or hospitalizations.          Current Medications: Current Meds  Medication Sig   albuterol (VENTOLIN HFA) 108 (90 Base) MCG/ACT inhaler Inhale 1-2 puffs into the lungs every 6 (six) hours as needed.   allopurinol (ZYLOPRIM) 300 MG tablet TAKE 1 TABLET BY MOUTH EVERY DAY (Patient taking differently: Patient taking every other day)   aspirin EC 81 MG tablet Take 81 mg by mouth daily. Swallow whole.   diazepam (VALIUM) 5 MG tablet SMARTSIG:1 Tablet(s) By Mouth   emtricitabine-tenofovir (TRUVADA) 200-300 MG tablet TAKE 1 TABLET BY MOUTH 1 TIME A DAY   furosemide (LASIX) 20 MG tablet Take 1 tablet (20 mg total) by mouth every other day.   HYDROcodone bit-homatropine (HYCODAN) 5-1.5 MG/5ML syrup Take 5 mLs by mouth at bedtime as needed for cough.   MITIGARE 0.6 MG CAPS TAKE 1 CAPSULE BY MOUTH DAILY AS NEEDED FOR GOUT FLARES   Multiple Vitamin (MULTIVITAMIN) tablet Take 1 tablet by mouth daily.   rosuvastatin (CRESTOR) 40 MG tablet TAKE 1 TABLET BY MOUTH EVERY DAY   sertraline (ZOLOFT) 25 MG tablet Take 1 tablet (25 mg total) by mouth daily.   Testosterone 30 MG/ACT SOLN APPLY 1 PUMP EVERY DAY   valACYclovir (VALTREX) 500 MG tablet Take 1 tablet (500 mg total) by mouth 2 (two) times daily.   [DISCONTINUED] atenolol (TENORMIN) 25 MG tablet TAKE 1 TABLET BY MOUTH EVERY DAY     Review of Systems:   Please see the history of present illness.    All other  systems reviewed and are negative.     EKGs/Labs/Other Test Reviewed:   EKG: EKG from May 2024 that I personally reviewed demonstrates sinus bradycardia with first-degree AV block  EKG Interpretation Date/Time:  Friday October 04 2023 15:02:53 EST Ventricular Rate:  58 PR Interval:  196 QRS Duration:  108 QT Interval:  446 QTC Calculation: 437 R Axis:   137  Text Interpretation: Sinus bradycardia Possible Lateral infarct , age undetermined Inferior infarct (cited on or before 04-Oct-2023) When compared with ECG of 02-Sep-2010 07:18, Borderline criteria for Lateral infarct are now Present ST elevation now present in Inferior leads T wave inversion no longer evident in Inferior leads Nonspecific T wave abnormality no longer evident in Lateral leads Confirmed by Alverda Skeans ([id number]) on 10/04/2023 3:31:35 PM         Risk Assessment/Calculations:          Physical Exam:   VS:  BP 126/72   Pulse (!) 58   Ht 5\' 7"  (1.702 m)   Wt 176 lb 6.4 oz (80 kg)   SpO2 99%   BMI  27.63 kg/m        Wt Readings from Last 3 Encounters:  10/04/23 176 lb 6.4 oz (80 kg)  09/23/23 180 lb 9.6 oz (81.9 kg)  08/26/23 184 lb 6.4 oz (83.6 kg)      GENERAL:  No apparent distress, AOx3 HEENT:  No carotid bruits, +2 carotid impulses, no scleral icterus CAR: RRR no murmurs, gallops, rubs, or thrills RES:  Clear to auscultation bilaterally ABD:  Soft, nontender, nondistended, positive bowel sounds x 4 VASC:  +2 right radial pulse and harvested left radial, +2 carotid pulses NEURO:  CN 2-12 grossly intact; motor and sensory grossly intact PSYCH:  No active depression or anxiety EXT:  No edema, ecchymosis, or cyanosis  Signed, Orbie Pyo, MD  10/04/2023 3:32 PM    [facility] [address] [place], [place], [place]  [address] Phone: [phone]; Fax: [phone]   Note:  This document was prepared using Dragon voice recognition software and may include unintentional  dictation errors.

## 2023-10-04 ENCOUNTER — Ambulatory Visit: Payer: Medicare PPO | Attending: Internal Medicine | Admitting: Internal Medicine

## 2023-10-04 ENCOUNTER — Encounter: Payer: Self-pay | Admitting: Internal Medicine

## 2023-10-04 VITALS — BP 126/72 | HR 58 | Ht 67.0 in | Wt 176.4 lb

## 2023-10-04 DIAGNOSIS — I10 Essential (primary) hypertension: Secondary | ICD-10-CM | POA: Diagnosis not present

## 2023-10-04 DIAGNOSIS — Z6828 Body mass index (BMI) 28.0-28.9, adult: Secondary | ICD-10-CM

## 2023-10-04 DIAGNOSIS — E785 Hyperlipidemia, unspecified: Secondary | ICD-10-CM

## 2023-10-04 DIAGNOSIS — Z951 Presence of aortocoronary bypass graft: Secondary | ICD-10-CM

## 2023-10-04 DIAGNOSIS — I700 Atherosclerosis of aorta: Secondary | ICD-10-CM | POA: Diagnosis not present

## 2023-10-04 DIAGNOSIS — R072 Precordial pain: Secondary | ICD-10-CM | POA: Diagnosis not present

## 2023-10-04 DIAGNOSIS — N1831 Chronic kidney disease, stage 3a: Secondary | ICD-10-CM

## 2023-10-04 MED ORDER — NITROGLYCERIN 0.4 MG SL SUBL: 0.4000 mg | SUBLINGUAL_TABLET | SUBLINGUAL | 6 refills | Status: AC | PRN

## 2023-10-04 MED ORDER — ISOSORBIDE MONONITRATE ER 30 MG PO TB24: 30.0000 mg | ORAL_TABLET | Freq: Every day | ORAL | 3 refills | Status: DC

## 2023-10-04 MED ORDER — METOPROLOL SUCCINATE ER 25 MG PO TB24: 25.0000 mg | ORAL_TABLET | Freq: Every day | ORAL | 3 refills | Status: DC

## 2023-10-04 MED ORDER — PANTOPRAZOLE SODIUM 20 MG PO TBEC: 20.0000 mg | DELAYED_RELEASE_TABLET | Freq: Every day | ORAL | 1 refills | Status: DC

## 2023-10-04 NOTE — Patient Instructions (Addendum)
 Medication Instructions:  Your physician has recommended you make the following change in your medication:   1) STOP atenolol 2) START metoprolol succinate (Toprol XL) 25 mg daily at bedtime 3) START isosorbide mononitrate (Imdur) 30 mg daily 4) START pantoprazole (Prontonix) 20 mg daily 5) START nitroglycerin--Dissolve 1 tablet under the tongue every 5 minutes as needed for chest pain. Max of 3 doses, then 911.   *If you need a refill on your cardiac medications before your next appointment, please call your pharmacy*  Testing/Procedures: Your provider has requested that you have a cardiac PET stress CT scan performed. You will be called to schedule an appointment for this test. This test will be completed at [facility].  Follow-Up: At [facility], you and your health needs are our priority.  As part of our continuing mission to provide you with exceptional heart care, we have created designated Provider Care Teams.  These Care Teams include your primary Cardiologist (physician) and Advanced Practice Providers (APPs -  Physician Assistants and Nurse Practitioners) who all work together to provide you with the care you need, when you need it.  Your next appointment:   After your cardiac PET stress CT  The format for your next appointment:   In Person  Provider:   Verne Carrow, MD {  Other Instructions    Please report to Radiology at the [facility] Main Entrance 30 minutes early for your test.  [address] [place], [place] [address]                         OR   Please report to Radiology at [facility] Main Entrance, medical mall, 30 mins prior to your test.  [address]  [place], [place]  How to Prepare for Your Cardiac PET/CT Stress Test:  Nothing to eat or drink, except water, 3 hours prior to arrival time.  NO caffeine/decaffeinated products, or chocolate 12 hours prior to arrival. (Please note  decaffeinated beverages (teas/coffees) still contain caffeine).  If you have caffeine within 12 hours prior, the test will need to be rescheduled.  Medication instructions: Do not take erectile dysfunction medications for 72 hours prior to test (sildenafil, tadalafil) Do not take nitrates (isosorbide mononitrate, Ranexa) the day before or day of test Do not take tamsulosin the day before or morning of test Hold theophylline containing medications for 12 hours. Hold Dipyridamole 48 hours prior to the test.  Diabetic Preparation: If able to eat breakfast prior to 3 hour fasting, you may take all medications, including your insulin. Do not worry if you miss your breakfast dose of insulin - start at your next meal. If you do not eat prior to 3 hour fast-Hold all diabetes (oral and insulin) medications. Patients who wear a continuous glucose monitor MUST remove the device prior to scanning.  You may take your remaining medications with water.  NO perfume, cologne or lotion on chest or abdomen area. FEMALES - Please avoid wearing dresses to this appointment.  Total time is 1 to 2 hours; you may want to bring reading material for the waiting time.  IF YOU THINK YOU MAY BE PREGNANT, OR ARE NURSING PLEASE INFORM THE TECHNOLOGIST.  In preparation for your appointment, medication and supplies will be purchased.  Appointment availability is limited, so if you need to cancel or reschedule, please call the Radiology Department Scheduler at [phone] 24 hours in advance to avoid a cancellation  fee of $100.00  What to Expect When you Arrive:  Once you arrive and check in for your appointment, you will be taken to a preparation room within the Radiology Department.  A technologist or Nurse will obtain your medical history, verify that you are correctly prepped for the exam, and explain the procedure.  Afterwards, an IV will be started in your arm and electrodes will be placed on your skin for EKG  monitoring during the stress portion of the exam. Then you will be escorted to the PET/CT scanner.  There, staff will get you positioned on the scanner and obtain a blood pressure and EKG.  During the exam, you will continue to be connected to the EKG and blood pressure machines.  A small, safe amount of a radioactive tracer will be injected in your IV to obtain a series of pictures of your heart along with an injection of a stress agent.    After your Exam:  It is recommended that you eat a meal and drink a caffeinated beverage to counter act any effects of the stress agent.  Drink plenty of fluids for the remainder of the day and urinate frequently for the first couple of hours after the exam.  Your doctor will inform you of your test results within 7-10 business days.  For more information and frequently asked questions, please visit our website: [email or web address]  For questions about your test or how to prepare for your test, please call: Cardiac Imaging Nurse Navigators Office: [phone]

## 2023-10-07 ENCOUNTER — Encounter: Payer: Self-pay | Admitting: Internal Medicine

## 2023-11-18 ENCOUNTER — Ambulatory Visit: Admitting: Family Medicine

## 2023-11-18 ENCOUNTER — Encounter: Payer: Self-pay | Admitting: Family Medicine

## 2023-11-18 ENCOUNTER — Encounter

## 2023-11-18 VITALS — BP 128/80 | HR 91 | Temp 98.2°F | Ht 67.0 in | Wt 183.4 lb

## 2023-11-18 DIAGNOSIS — J0100 Acute maxillary sinusitis, unspecified: Secondary | ICD-10-CM

## 2023-11-18 DIAGNOSIS — R519 Headache, unspecified: Secondary | ICD-10-CM | POA: Diagnosis not present

## 2023-11-18 DIAGNOSIS — S0031XA Abrasion of nose, initial encounter: Secondary | ICD-10-CM | POA: Diagnosis not present

## 2023-11-18 MED ORDER — MUPIROCIN 2 % EX OINT: 1.0000 | TOPICAL_OINTMENT | Freq: Two times a day (BID) | CUTANEOUS | 0 refills | Status: DC

## 2023-11-18 MED ORDER — DOXYCYCLINE HYCLATE 100 MG PO TABS: 100.0000 mg | ORAL_TABLET | Freq: Two times a day (BID) | ORAL | 0 refills | Status: DC

## 2023-11-18 NOTE — Progress Notes (Signed)
 Subjective:  Patient ID: Jeremy Hendricks, male    DOB: 1955-11-18  Age: 68 y.o. MRN: [id number]  CC:  Chief Complaint  Patient presents with   Sore    Pt notes sore on the inside of his Lt nostril and notes started about 4 days ago and has been causing a lot of head pressure and nose pains     HPI ACE BERGFELD presents for   L nostril lesion: Intermittent sores intermittently for years. Usually improves in few days  Current episode 4 days ago. Some congestion with allergies, sneezing.  Pains comes and goes. No d/c. Pain into sinus. Left cheek with tooth pain - radiating to left ear.  No fever.  Tx: neosporin, then bacitracin - few times.  Tylenol few times.   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 s/p CABG 2002. b. s/p redo 2012.   DJD (degenerative joint disease)    Gout    History of ETT    a. ETT 6/16:  normal   Past Surgical History:  Procedure Laterality Date   CORONARY ARTERY BYPASS GRAFT  2002   CORONARY ARTERY BYPASS GRAFT  08-2010   L-LAD remained from original CABG; new grafts  incl L radial- PDA + RIMA-RI   VASECTOMY     No Known Allergies Prior to Admission medications   Medication Sig Start Date End Date Taking? Authorizing Provider  albuterol (VENTOLIN HFA) 108 (90 Base) MCG/ACT inhaler Inhale 1-2 puffs into the lungs every 6 (six) hours as needed. 06/17/23   Margaretann Loveless, PA-C  allopurinol (ZYLOPRIM) 300 MG tablet TAKE 1 TABLET BY MOUTH EVERY DAY Patient taking differently: Patient taking every other day 05/31/23   Pollyann Savoy, MD  aspirin EC 81 MG tablet Take 81 mg by mouth daily. Swallow whole.    [provider]  diazepam (VALIUM) 5 MG tablet SMARTSIG:1 Tablet(s) By Mouth 06/05/23   [provider]  emtricitabine-tenofovir (TRUVADA) 200-300 MG tablet TAKE 1 TABLET BY MOUTH 1 TIME A DAY 08/26/23   Shade Flood, MD  furosemide (LASIX) 20 MG tablet Take 1 tablet (20 mg total) by mouth every other day. 08/26/23   Shade Flood, MD  HYDROcodone bit-homatropine (HYCODAN) 5-1.5 MG/5ML syrup Take 5 mLs by mouth at bedtime as needed for cough. 09/23/23   Shade Flood, MD  isosorbide mononitrate (IMDUR) 30 MG 24 hr tablet Take 1 tablet (30 mg total) by mouth daily. 10/04/23   Orbie Pyo, MD  metoprolol succinate (TOPROL XL) 25  MG 24 hr tablet Take 1 tablet (25 mg total) by mouth at bedtime. 10/04/23   Orbie Pyo, MD  MITIGARE 0.6 MG CAPS TAKE 1 CAPSULE BY MOUTH DAILY AS NEEDED FOR GOUT FLARES 04/22/23   Gearldine Bienenstock, PA-C  Multiple Vitamin (MULTIVITAMIN) tablet Take 1 tablet by mouth daily.    [provider]  nitroGLYCERIN (NITROSTAT) 0.4 MG SL tablet Place 1 tablet (0.4 mg total) under the tongue every 5 (five) minutes as needed for chest pain. 10/04/23   Orbie Pyo, MD  pantoprazole (PROTONIX) 20 MG tablet Take 1 tablet (20 mg total) by mouth daily. 10/04/23   Orbie Pyo, MD  rosuvastatin (CRESTOR) 40 MG tablet TAKE 1 TABLET BY MOUTH EVERY DAY 12/19/22   Kathleene Hazel, MD  sertraline  (ZOLOFT) 25 MG tablet Take 1 tablet (25 mg total) by mouth daily. 06/06/23   Shade Flood, MD  Testosterone 30 MG/ACT SOLN APPLY 1 PUMP EVERY DAY 08/26/23   Shade Flood, MD  valACYclovir (VALTREX) 500 MG tablet Take 1 tablet (500 mg total) by mouth 2 (two) times daily. 08/26/23   Shade Flood, MD   Social History   Socioeconomic History   Marital status: Single    Spouse name: Not on file   Number of children: 2   Years of education: Not on file   Highest education level: Professional school degree (e.g., MD, DDS, DVM, JD)  Occupational History   Occupation: Realtor  Tobacco Use   Smoking status: Former    Current packs/day: 0.00    Types: Cigarettes    Quit date: 08/14/1999    Years since quitting: 24.2    Passive exposure: Past   Smokeless tobacco: Never   Tobacco comments:    Social smoker  Vaping Use   Vaping status: Never Used  Substance and Sexual Activity   Alcohol use: Yes    Alcohol/week: 14.0 standard drinks of alcohol    Types: 14 Shots of liquor per week    Comment: 1oz-2oz daily    Drug use: No   Sexual activity: Yes  Other Topics Concern   Not on file  Social History Narrative   Single   Education: College   Exercise: Yes   Social Drivers of Health   Financial Resource Strain: Low Risk  (08/22/2023)   Overall Financial Resource Strain (CARDIA)    Difficulty of Paying Living Expenses: Not hard at all  Food Insecurity: No Food Insecurity (08/22/2023)   Hunger Vital Sign    Worried About Running Out of Food in the Last Year: Never true    Ran Out of Food in the Last Year: Never true  Transportation Needs: No Transportation Needs (08/22/2023)   PRAPARE - Administrator, Civil Service (Medical): No    Lack of Transportation (Non-Medical): No  Physical Activity: Sufficiently Active (08/22/2023)   Exercise Vital Sign    Days of Exercise per Week: 5 days    Minutes of Exercise per Session: 60 min  Stress: No Stress Concern Present  (08/22/2023)   [employer] of Occupational Health - Occupational Stress Questionnaire    Feeling of Stress : Only a little  Social Connections: Moderately Integrated (08/22/2023)   Social Connection and Isolation Panel [NHANES]    Frequency of Communication with Friends and Family: More than three times a week    Frequency of Social Gatherings with Friends and Family: More than three times a week    Attends Religious  Services: Never    Active Member of Clubs or Organizations: Yes    Attends Banker Meetings: More than 4 times per year    Marital Status: Living with partner  Intimate Partner Violence: Not At Risk (11/22/2022)   Humiliation, Afraid, Rape, and Kick questionnaire    Fear of Current or Ex-Partner: No    Emotionally Abused: No    Physically Abused: No    Sexually Abused: No    Review of Systems   Objective:   Vitals:   11/18/23 1356  BP: 128/80  Pulse: 91  Temp: 98.2 F (36.8 C)  TempSrc: Temporal  SpO2: 94%  Weight: 183 lb 6.4 oz (83.2 kg)  Height: 5\' 7"  (1.702 m)     Physical Exam Vitals reviewed.  Constitutional:      General: He is not in acute distress.    Appearance: Normal appearance. He is well-developed.  HENT:     Head: Normocephalic and atraumatic.     Left Ear: Tympanic membrane, ear canal and external ear normal.     Nose:     Comments: No appreciable soft tissue swelling or asymmetry of right versus left aspect of nose.  Possible slight swelling of distal left nasal ala with small area of abrasion/irritation, slight crust on the internal aspect of left nasal ala, lateral portion.  No active bleeding or mass appreciated.  No active discharge. Left maxillary sinus tenderness. No facial swelling. Cardiovascular:     Rate and Rhythm: Normal rate.  Pulmonary:     Effort: Pulmonary effort is normal.  Lymphadenopathy:     Cervical: No cervical adenopathy.  Neurological:     Mental Status: He is alert and oriented to person,  place, and time.  Psychiatric:        Mood and Affect: Mood normal.     Assessment & Plan:  LAURIN MORGENSTERN is a 68 y.o. male . Abrasion of nose, initial encounter - Plan: mupirocin ointment (BACTROBAN) 2 %  Face pain - Plan: doxycycline (VIBRA-TABS) 100 MG tablet  Acute maxillary sinusitis, recurrence not specified - Plan: doxycycline (VIBRA-TABS) 100 MG tablet  Left nasal abrasion, irritation.  Possible abrasion with secondary infection from recent allergies and congestion, nose blowing, sneezing.  Differential includes HSV as well but no vesicles identified, no external vesicles or rash.  Worsening pain into the sinus, possible secondary sinusitis.  I do not appreciate any facial swelling or rash/erythema.  Afebrile.  TM, ear on left appears normal, again no rash or vesicles.  Unlikely HSV/zoster, especially at day 4 of symptoms.  - Topical Bactroban inside left nostril twice per day for next week.  Start doxycycline for possible early sinus infection versus cellulitis.  No identified abscess.  Overall reassuring exam at present.  Decided against imaging at this time or blood work, but update on symptoms in the next 24 to 48 hours.  RTC/ER precautions given.  Unlikely deep space infection at this time but precautions given as above.  Meds ordered this encounter  Medications   doxycycline (VIBRA-TABS) 100 MG tablet    Sig: Take 1 tablet (100 mg total) by mouth 2 (two) times daily.    Dispense:  20 tablet    Refill:  0   mupirocin ointment (BACTROBAN) 2 %    Sig: Apply 1 Application topically 2 (two) times daily. For 1 week    Dispense:  22 g    Refill:  0   There are no Patient Instructions on file for  this visit.    Signed,   Meredith Staggers, MD [facility], [facility] 11/18/23 2:53 PM

## 2023-11-18 NOTE — Patient Instructions (Signed)
 Thanks for coming in today and appreciate your patience.  Try Bactroban ointment twice per day to the irritated area inside the nostril.  Can also start oral antibiotic doxycycline twice per day to cover for a secondary sinus infection.  I do not see any obvious face swelling at this time but continue to monitor for any worsening symptoms.  If you notice any face swelling, redness or rash, or any worsening symptoms be seen right away but that is unlikely to occur.  Give me an update in the next day or 2 and let me know how you are doing.  Let me know if there are questions and take care.

## 2023-11-28 ENCOUNTER — Ambulatory Visit: Admitting: Family Medicine

## 2023-11-28 ENCOUNTER — Encounter: Payer: Self-pay | Admitting: Family Medicine

## 2023-11-28 VITALS — BP 144/80 | HR 56 | Temp 97.7°F | Ht 67.0 in | Wt 181.2 lb

## 2023-11-28 DIAGNOSIS — S39012A Strain of muscle, fascia and tendon of lower back, initial encounter: Secondary | ICD-10-CM

## 2023-11-28 MED ORDER — CYCLOBENZAPRINE HCL 10 MG PO TABS: 5.0000 mg | ORAL_TABLET | Freq: Three times a day (TID) | ORAL | 0 refills | Status: DC | PRN

## 2023-11-28 NOTE — Progress Notes (Signed)
 Subjective:  Patient ID: Jeremy Hendricks, male    DOB: 06/30/1956  Age: 68 y.o. MRN: [id number]  CC:  Chief Complaint  Patient presents with   Back Pain    Pt notes lower back pain over the last week and has progressively worsened     HPI Jeremy Hendricks presents for   Back pain: Went to gym after last visit - abdominal work day prior, sore next day. No pop/injury. Has tried chiropractic tx twice - some relief, not sustained.  Bearable today after self treatment with 40mg  dose this am and tylenol (has prednisone at home). Hx of heart disease.  No radicular pain. Low back bilateral.  No hematuria. Urinating ok.  No bowel or bladder incontinence, no saddle anesthesia, no lower extremity weakness. No fever/weight loss/night sweats.   Lumbar spine x-ray 11/01/2021, lumbar scoliosis, multilevel spondylosis and facet joint arthropathy of the lumbar spine.  Significant disc space narrowing between L2-3 and L3-4.   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 s/p CABG 2002. b. s/p redo 2012.   DJD (degenerative  joint disease)    Gout    History of ETT    a. ETT 6/16:  normal   Past Surgical History:  Procedure Laterality Date   CORONARY ARTERY BYPASS GRAFT  2002   CORONARY ARTERY BYPASS GRAFT  08-2010   L-LAD remained from original CABG; new grafts incl L radial- PDA + RIMA-RI   VASECTOMY     No Known Allergies Prior to Admission medications   Medication Sig Start Date End Date Taking? Authorizing Provider  albuterol (VENTOLIN HFA) 108 (90 Base) MCG/ACT inhaler Inhale 1-2 puffs into the lungs every 6 (six) hours as needed. 06/17/23  Yes Joycelyn Man M, PA-C  allopurinol (ZYLOPRIM) 300 MG tablet TAKE 1 TABLET BY MOUTH EVERY DAY Patient taking differently: Patient taking every other day 05/31/23  Yes Deveshwar, Janalyn Rouse, MD  aspirin EC 81 MG tablet Take 81 mg by mouth daily. Swallow whole.   Yes [provider]  diazepam (VALIUM) 5 MG tablet SMARTSIG:1 Tablet(s) By Mouth 06/05/23  Yes [provider]  doxycycline (VIBRA-TABS) 100 MG tablet Take 1 tablet (100 mg total) by mouth 2 (two) times daily. 11/18/23  Yes Shade Flood, MD  emtricitabine-tenofovir (TRUVADA) 200-300 MG tablet TAKE 1 TABLET BY MOUTH 1 TIME A DAY 08/26/23  Yes Shade Flood, MD  furosemide (LASIX) 20 MG tablet Take 1 tablet (20 mg total) by mouth every other day. 08/26/23  Yes Shade Flood, MD  HYDROcodone bit-homatropine (HYCODAN) 5-1.5 MG/5ML syrup Take 5 mLs by mouth at bedtime as needed for cough. 09/23/23  Yes Shade Flood, MD  isosorbide mononitrate (IMDUR) 30 MG 24 hr tablet Take 1 tablet (30 mg total) by mouth daily. 10/04/23  Yes Orbie Pyo, MD  metoprolol succinate (TOPROL XL) 25 MG 24 hr tablet Take 1 tablet (25 mg total) by mouth at bedtime. 10/04/23  Yes Orbie Pyo, MD  MITIGARE 0.6 MG CAPS TAKE 1 CAPSULE BY MOUTH DAILY AS NEEDED FOR GOUT FLARES 04/22/23  Yes Gearldine Bienenstock, PA-C  Multiple Vitamin (MULTIVITAMIN) tablet Take 1 tablet by mouth daily.   Yes [provider]  mupirocin ointment (BACTROBAN) 2 % Apply 1 Application topically 2 (two) times daily. For 1 week 11/18/23  Yes Shade Flood, MD  nitroGLYCERIN (NITROSTAT) 0.4 MG SL tablet Place 1 tablet (0.4 mg total) under the tongue every 5 (five) minutes as needed for chest pain. 10/04/23  Yes Orbie Pyo, MD  pantoprazole (PROTONIX) 20 MG tablet Take 1 tablet (20 mg total) by mouth daily. 10/04/23  Yes Orbie Pyo, MD  rosuvastatin (CRESTOR) 40 MG tablet TAKE 1 TABLET BY MOUTH EVERY DAY 12/19/22  Yes Kathleene Hazel, MD  sertraline (ZOLOFT) 25 MG tablet Take 1 tablet (25 mg total) by mouth daily. 06/06/23  Yes Shade Flood, MD  Testosterone 30 MG/ACT SOLN APPLY 1 PUMP EVERY DAY 08/26/23  Yes Shade Flood, MD  valACYclovir (VALTREX) 500 MG tablet Take 1 tablet (500 mg total) by mouth 2 (two) times daily. 08/26/23  Yes Shade Flood, MD   Social History   Socioeconomic History   Marital status: Single    Spouse name: Not on file   Number of children: 2   Years of education: Not on file   Highest education level: Professional school degree (e.g., MD, DDS, DVM, JD)  Occupational History   Occupation: Realtor  Tobacco Use   Smoking status: Former    Current packs/day: 0.00    Types: Cigarettes    Quit date: 08/14/1999    Years since quitting: 24.3    Passive exposure: Past   Smokeless tobacco: Never   Tobacco comments:    Social smoker  Vaping Use   Vaping status: Never Used  Substance and Sexual Activity   Alcohol use: Yes    Alcohol/week: 14.0 standard drinks of alcohol    Types: 14 Shots of liquor per week    Comment: 1oz-2oz daily    Drug use: No   Sexual activity: Yes  Other Topics Concern   Not on file  Social History Narrative   Single   Education: College   Exercise: Yes   Social Drivers of Health   Financial Resource Strain: Low Risk  (08/22/2023)   Overall Financial Resource Strain (CARDIA)    Difficulty of Paying Living Expenses: Not hard at all   Food Insecurity: No Food Insecurity (08/22/2023)   Hunger Vital Sign    Worried About Running Out of Food in the Last Year: Never true    Ran Out of Food in the Last Year: Never true  Transportation Needs: No Transportation Needs (08/22/2023)   PRAPARE - Administrator, Civil Service (Medical): No    Lack of Transportation (Non-Medical): No  Physical Activity: Sufficiently Active (08/22/2023)   Exercise Vital Sign    Days of Exercise per Week: 5 days    Minutes of Exercise per Session: 60 min  Stress: No Stress Concern Present (08/22/2023)   [employer] of Occupational Health - Occupational Stress Questionnaire    Feeling of Stress : Only a little  Social Connections: Moderately Integrated (08/22/2023)   Social Connection and Isolation Panel [NHANES]    Frequency of Communication with Friends and Family: More than three times a week    Frequency of Social Gatherings with Friends and Family: More than three times a week    Attends Religious Services: Never    Database administrator or Organizations: Yes    Attends Engineer, structural: More than 4 times per year    Marital Status: Living with partner  Intimate Partner Violence: Not At Risk (11/22/2022)   Humiliation, Afraid, Rape, and Kick questionnaire    Fear of Current or Ex-Partner: No    Emotionally Abused: No    Physically Abused: No    Sexually Abused: No    Review of Systems  Per HPI.  Objective:   Vitals:   11/28/23 0805  BP: (!) 144/80  Pulse: (!) 56  Temp: 97.7 F (36.5 C)  TempSrc: Temporal  SpO2: 99%  Weight: 181 lb 3.2 oz (82.2 kg)  Height: 5\' 7"  (1.702 m)     Physical Exam Vitals reviewed.  Constitutional:      General: He is not in acute distress.    Appearance: Normal appearance. He is well-developed.  HENT:     Head: Normocephalic and atraumatic.  Cardiovascular:     Rate and Rhythm: Normal rate.  Pulmonary:     Effort: Pulmonary effort is normal.  Musculoskeletal:      Comments: Lumbar spine, no midline bony tenderness.  Discomfort of paraspinal muscles lower lumbar spine diffusely. Limited range of motion, flexion to approximately 45 degrees, minimal lateral flexion, rotation with discomfort.  Negative seated straight leg raise.  Ambulating without assistive device, and able to heel and toe walk without difficulty.  Reflexes 1+ bilateral at Achilles, difficult reflexes at patella but equal.  Neurological:     Mental Status: He is alert and oriented to person, place, and time.  Psychiatric:        Mood and Affect: Mood normal.        Assessment & Plan:  Jeremy Hendricks is a 69 y.o. male . Strain of lumbar region, initial encounter - Plan: cyclobenzaprine (FLEXERIL) 10 MG tablet Lumbar strain with underlying spondylosis, degenerative disc disease, likely flare.  Some improvement with his home treatment of 40 mg prednisone this morning.  Will continue for 4 additional days.  Flexeril if needed for spasm, potential side effects chest.  Has tramadol as needed for breakthrough pain, handout given and other symptomatic care discussed with RTC precautions given.  Without specific injury and based on current exam we will hold on imaging at this time.  Meds ordered this encounter  Medications   cyclobenzaprine (FLEXERIL) 10 MG tablet    Sig: Take 0.5-1 tablets (5-10 mg total) by mouth 3 (three) times daily as needed for muscle spasms.    Dispense:  30 tablet    Refill:  0   Patient Instructions  Ok to take prednisone 20mg  2 pills per day for additional 4 days.  Flexeril 10mg  1/2-1 every 8 hours as needed.  Tramadol if needed for breakthrough pain.  Heat or ice, gentle range of motion, see info below.  I am encouraged by some improvement from prednisone this morning, I think you will continue to improve.  Keep me posted, but follow-up next  week if not continuing to improve, sooner if worse.  Hang in there.  Acute Back Pain, Adult Acute back pain is sudden and  usually short-lived. It is often caused by an injury to the muscles and tissues in the back. The injury may result from: A muscle, tendon, or ligament getting overstretched or torn. Ligaments are tissues that connect bones to each other. Lifting something improperly can cause a back strain. Wear and tear (degeneration) of the spinal disks. Spinal disks are circular tissue that provide cushioning between the bones of the spine (vertebrae). Twisting motions, such as while playing sports or doing yard work. A hit to the back. Arthritis. You may have a physical exam, lab tests, and imaging tests to find the cause of your pain. Acute back pain usually goes away with rest and home care. Follow these instructions at home: Managing pain, stiffness, and swelling Take over-the-counter and prescription medicines only as told by your health care provider. Treatment may include medicines for pain and inflammation that are taken by mouth or applied to the skin, or muscle relaxants. Your health care provider may recommend applying ice during the first 24-48 hours after your pain starts. To do this: Put ice in a plastic bag. Place a towel between your skin and the bag. Leave the ice on for 20 minutes, 2-3 times a day. Remove the ice if your skin turns bright red. This is very important. If you cannot feel pain, heat, or cold, you have a greater risk of damage to the area. If directed, apply heat to the affected area as often as told by your health care provider. Use the heat source that your health care provider recommends, such as a moist heat pack or a heating pad. Place a towel between your skin and the heat source. Leave the heat on for 20-30 minutes. Remove the heat if your skin turns bright red. This is especially important if you are unable to feel pain, heat, or cold. You have a greater risk of getting burned. Activity  Do not stay in bed. Staying in bed for more than 1-2 days can delay your  recovery. Sit up and stand up straight. Avoid leaning forward when you sit or hunching over when you stand. If you work at a desk, sit close to it so you do not need to lean over. Keep your chin tucked in. Keep your neck drawn back, and keep your elbows bent at a 90-degree angle (right angle). Sit high and close to the steering wheel when you drive. Add lower back (lumbar) support to your car seat, if needed. Take short walks on even surfaces as soon as you are able. Try to increase the length of time you walk each day. Do not sit, drive, or stand in one place for more than 30 minutes at a time. Sitting or standing for long periods of time can put stress on your back. Do not drive or use heavy machinery while taking prescription pain medicine. Use proper lifting techniques. When you bend and lift, use positions that put less stress on your back: Twin your knees. Keep the load close to your body. Avoid twisting. Exercise regularly as told by your health care provider. Exercising helps your back heal faster and helps prevent back injuries by keeping muscles strong and flexible. Work with a physical therapist to make a safe exercise program, as recommended by your health care provider. Do any exercises as told by your physical therapist. Lifestyle Maintain a healthy  weight. Extra weight puts stress on your back and makes it difficult to have good posture. Avoid activities or situations that make you feel anxious or stressed. Stress and anxiety increase muscle tension and can make back pain worse. Learn ways to manage anxiety and stress, such as through exercise. General instructions Sleep on a firm mattress in a comfortable position. Try lying on your side with your knees slightly bent. If you lie on your back, put a pillow under your knees. Keep your head and neck in a straight line with your spine (neutral position) when using electronic equipment like smartphones or pads. To do this: Raise your  smartphone or pad to look at it instead of bending your head or neck to look down. Put the smartphone or pad at the level of your face while looking at the screen. Follow your treatment plan as told by your health care provider. This may include: Cognitive or behavioral therapy. Acupuncture or massage therapy. Meditation or yoga. Contact a health care provider if: You have pain that is not relieved with rest or medicine. You have increasing pain going down into your legs or buttocks. Your pain does not improve after 2 weeks. You have pain at night. You lose weight without trying. You have a fever or chills. You develop nausea or vomiting. You develop abdominal pain. Get help right away if: You develop new bowel or bladder control problems. You have unusual weakness or numbness in your arms or legs. You feel faint. These symptoms may represent a serious problem that is an emergency. Do not wait to see if the symptoms will go away. Get medical help right away. Call your local emergency services (911 in the [place] Do not drive yourself to the hospital. Summary Acute back pain is sudden and usually short-lived. Use proper lifting techniques. When you bend and lift, use positions that put less stress on your back. Take over-the-counter and prescription medicines only as told by your health care provider, and apply heat or ice as told. This information is not intended to replace advice given to you by your health care provider. Make sure you discuss any questions you have with your health care provider. Document Revised: 10/21/2020 Document Reviewed: 10/21/2020 Elsevier Patient Education  2024 [employer]             Signed,   Caro Christmas, MD [facility], [facility] 11/28/23 8:30 AM

## 2023-11-28 NOTE — Patient Instructions (Addendum)
 Ok to take prednisone 20mg  2 pills per day for additional 4 days.  Flexeril 10mg  1/2-1 every 8 hours as needed.  Tramadol if needed for breakthrough pain.  Heat or ice, gentle range of motion, see info below.  I am encouraged by some improvement from prednisone this morning, I think you will continue to improve.  Keep me posted, but follow-up next week if not continuing to improve, sooner if worse.  Hang in there.  Acute Back Pain, Adult Acute back pain is sudden and usually short-lived. It is often caused by an injury to the muscles and tissues in the back. The injury may result from: A muscle, tendon, or ligament getting overstretched or torn. Ligaments are tissues that connect bones to each other. Lifting something improperly can cause a back strain. Wear and tear (degeneration) of the spinal disks. Spinal disks are circular tissue that provide cushioning between the bones of the spine (vertebrae). Twisting motions, such as while playing sports or doing yard work. A hit to the back. Arthritis. You may have a physical exam, lab tests, and imaging tests to find the cause of your pain. Acute back pain usually goes away with rest and home care. Follow these instructions at home: Managing pain, stiffness, and swelling Take over-the-counter and prescription medicines only as told by your health care provider. Treatment may include medicines for pain and inflammation that are taken by mouth or applied to the skin, or muscle relaxants. Your health care provider may recommend applying ice during the first 24-48 hours after your pain starts. To do this: Put ice in a plastic bag. Place a towel between your skin and the bag. Leave the ice on for 20 minutes, 2-3 times a day. Remove the ice if your skin turns bright red. This is very important. If you cannot feel pain, heat, or cold, you have a greater risk of damage to the area. If directed, apply heat to the affected area as often as told by your health  care provider. Use the heat source that your health care provider recommends, such as a moist heat pack or a heating pad. Place a towel between your skin and the heat source. Leave the heat on for 20-30 minutes. Remove the heat if your skin turns bright red. This is especially important if you are unable to feel pain, heat, or cold. You have a greater risk of getting burned. Activity  Do not stay in bed. Staying in bed for more than 1-2 days can delay your recovery. Sit up and stand up straight. Avoid leaning forward when you sit or hunching over when you stand. If you work at a desk, sit close to it so you do not need to lean over. Keep your chin tucked in. Keep your neck drawn back, and keep your elbows bent at a 90-degree angle (right angle). Sit high and close to the steering wheel when you drive. Add lower back (lumbar) support to your car seat, if needed. Take short walks on even surfaces as soon as you are able. Try to increase the length of time you walk each day. Do not sit, drive, or stand in one place for more than 30 minutes at a time. Sitting or standing for long periods of time can put stress on your back. Do not drive or use heavy machinery while taking prescription pain medicine. Use proper lifting techniques. When you bend and lift, use positions that put less stress on your back: Lluveras your knees. Keep the  load close to your body. Avoid twisting. Exercise regularly as told by your health care provider. Exercising helps your back heal faster and helps prevent back injuries by keeping muscles strong and flexible. Work with a physical therapist to make a safe exercise program, as recommended by your health care provider. Do any exercises as told by your physical therapist. Lifestyle Maintain a healthy weight. Extra weight puts stress on your back and makes it difficult to have good posture. Avoid activities or situations that make you feel anxious or stressed. Stress and anxiety  increase muscle tension and can make back pain worse. Learn ways to manage anxiety and stress, such as through exercise. General instructions Sleep on a firm mattress in a comfortable position. Try lying on your side with your knees slightly bent. If you lie on your back, put a pillow under your knees. Keep your head and neck in a straight line with your spine (neutral position) when using electronic equipment like smartphones or pads. To do this: Raise your smartphone or pad to look at it instead of bending your head or neck to look down. Put the smartphone or pad at the level of your face while looking at the screen. Follow your treatment plan as told by your health care provider. This may include: Cognitive or behavioral therapy. Acupuncture or massage therapy. Meditation or yoga. Contact a health care provider if: You have pain that is not relieved with rest or medicine. You have increasing pain going down into your legs or buttocks. Your pain does not improve after 2 weeks. You have pain at night. You lose weight without trying. You have a fever or chills. You develop nausea or vomiting. You develop abdominal pain. Get help right away if: You develop new bowel or bladder control problems. You have unusual weakness or numbness in your arms or legs. You feel faint. These symptoms may represent a serious problem that is an emergency. Do not wait to see if the symptoms will go away. Get medical help right away. Call your local emergency services (911 in the [place] Do not drive yourself to the hospital. Summary Acute back pain is sudden and usually short-lived. Use proper lifting techniques. When you bend and lift, use positions that put less stress on your back. Take over-the-counter and prescription medicines only as told by your health care provider, and apply heat or ice as told. This information is not intended to replace advice given to you by your health care provider. Make sure  you discuss any questions you have with your health care provider. Document Revised: 10/21/2020 Document Reviewed: 10/21/2020 Elsevier Patient Education  2024 [employer].

## 2023-11-30 ENCOUNTER — Other Ambulatory Visit: Payer: Self-pay | Admitting: Cardiovascular Disease

## 2023-11-30 ENCOUNTER — Other Ambulatory Visit: Payer: Self-pay | Admitting: Family Medicine

## 2023-11-30 DIAGNOSIS — F419 Anxiety disorder, unspecified: Secondary | ICD-10-CM

## 2023-12-09 ENCOUNTER — Encounter (HOSPITAL_COMMUNITY): Payer: Self-pay

## 2023-12-10 ENCOUNTER — Ambulatory Visit (HOSPITAL_COMMUNITY)
Admission: RE | Admit: 2023-12-10 | Discharge: 2023-12-10 | Disposition: A | Discharge status: Home / Self Care | Payer: Medicare PPO | Source: Ambulatory Visit | Attending: Internal Medicine | Admitting: Internal Medicine

## 2023-12-10 DIAGNOSIS — R072 Precordial pain: Secondary | ICD-10-CM | POA: Diagnosis not present

## 2023-12-10 LAB — NM PET CT CARDIAC PERFUSION MULTI W/ABSOLUTE BLOODFLOW
MBFR: 2.24
Nuc Rest EF: 57 %
Nuc Stress EF: 59 %
Rest MBF: 0.58 ml/g/min
Rest Nuclear Isotope Dose: 21.3 mCi
ST Depression (mm): 0 mm
Stress MBF: 1.3 ml/g/min
Stress Nuclear Isotope Dose: 21.3 mCi
TID: 1.02

## 2023-12-10 MED ORDER — REGADENOSON 0.4 MG/5ML IV SOLN
0.4000 mg | Freq: Once | INTRAVENOUS | Status: AC
Start: 2023-12-10 — End: 2023-12-10
  Administered 2023-12-10: 0.4 mg via INTRAVENOUS

## 2023-12-10 MED ORDER — RUBIDIUM RB82 GENERATOR (RUBYFILL)
21.3400 | PACK | Freq: Once | INTRAVENOUS | Status: AC
  Administered 2023-12-10: 21.34 via INTRAVENOUS

## 2023-12-10 MED ORDER — REGADENOSON 0.4 MG/5ML IV SOLN
INTRAVENOUS | Status: AC
  Filled 2023-12-10: qty 5

## 2023-12-10 MED ORDER — RUBIDIUM RB82 GENERATOR (RUBYFILL)
21.3300 | PACK | Freq: Once | INTRAVENOUS | Status: AC
  Administered 2023-12-10: 21.33 via INTRAVENOUS

## 2023-12-10 NOTE — Progress Notes (Signed)
 Pt. Tolerated lexi scan well.

## 2023-12-11 ENCOUNTER — Other Ambulatory Visit: Payer: Self-pay | Admitting: Rheumatology

## 2023-12-12 NOTE — Telephone Encounter (Signed)
 Last Fill: 05/31/2023  Labs: 06/24/2023 WBC 19.0, Monocytes Relative 16.8, Neutro abs 12.6, Monocytes Absolute 3.2, Glucose 111, BUN 26. GFR 58.30  Next Visit: 01/31/2024  Last Visit: 07/30/2023  DX: Idiopathic chronic gout of multiple sites without tophus   Current Dose per office note 07/30/2023: Allopurinol  300 mg by mouth every other day   Okay to refill Allopurinol ?

## 2023-12-26 ENCOUNTER — Ambulatory Visit: Attending: Cardiovascular Disease | Admitting: Cardiovascular Disease

## 2023-12-26 ENCOUNTER — Encounter: Payer: Self-pay | Admitting: Cardiovascular Disease

## 2023-12-26 VITALS — BP 130/78 | HR 71 | Ht 67.0 in | Wt 182.4 lb

## 2023-12-26 DIAGNOSIS — Z01812 Encounter for preprocedural laboratory examination: Secondary | ICD-10-CM | POA: Diagnosis not present

## 2023-12-26 DIAGNOSIS — E785 Hyperlipidemia, unspecified: Secondary | ICD-10-CM | POA: Diagnosis not present

## 2023-12-26 DIAGNOSIS — Z01810 Encounter for preprocedural cardiovascular examination: Secondary | ICD-10-CM | POA: Diagnosis not present

## 2023-12-26 DIAGNOSIS — I10 Essential (primary) hypertension: Secondary | ICD-10-CM | POA: Diagnosis not present

## 2023-12-26 DIAGNOSIS — I25110 Atherosclerotic heart disease of native coronary artery with unstable angina pectoris: Secondary | ICD-10-CM | POA: Diagnosis not present

## 2023-12-26 NOTE — Patient Instructions (Signed)
 Medication Instructions:  No changes *If you need a refill on your cardiac medications before your next appointment, please call your pharmacy*  Lab Work: Today: first floor lab for blood work (bmet, cbc)   Testing/Procedures: Your physician has requested that you have a cardiac catheterization. Cardiac catheterization is used to diagnose and/or treat various heart conditions. Doctors may recommend this procedure for a number of different reasons. The most common reason is to evaluate chest pain. Chest pain can be a symptom of coronary artery disease (CAD), and cardiac catheterization can show whether plaque is narrowing or blocking your heart's arteries. This procedure is also used to evaluate the valves, as well as measure the blood flow and oxygen levels in different parts of your heart. For further information please visit [email or web address] Please follow instruction sheet, as given.   Follow-Up: At [facility], you and your health needs are our priority.  As part of our continuing mission to provide you with exceptional heart care, our providers are all part of one team.  This team includes your primary Cardiologist (physician) and Advanced Practice Providers or APPs (Physician Assistants and Nurse Practitioners) who all work together to provide you with the care you need, when you need it.  Your next appointment:   3 week(s)  Provider:   One of our Advanced Practice Providers (APPs): Melita Springer, PA-C  Friddie Jetty, NP Evaline Hill, NP  Theotis Flake, PA-C Lawana Pray, NP  Willis Harter, PA-C Lovette Rud, PA-C  [place], PA-C Ernest Dick, NP  Marlana Silvan, NP Marcie Sever, PA-C  Laquita Plant, PA-C    Dayna Dunn, PA-C  Marlyse Single, PA-C Palmer Bobo, NP Katlyn West, NP Callie Goodrich, PA-C  Evan Williams, PA-C Sheng Haley, PA-C  Xika Zhao, NP Liane Redman, PA-C          Cardiac/Peripheral Catheterization   You are scheduled for a Cardiac  Catheterization on Tuesday, May 20 with Dr. Antoinette Batman.  1. Please arrive at the [facility] (Main Entrance A) at [facility]: [address] [place], [place] [address] at 7:00 AM (This time is TWO hour(s) before your procedure to ensure your preparation).   Free valet parking service is available. You will check in at ADMITTING. The support person will be asked to wait in the waiting room.  It is OK to have someone drop you off and come back when you are ready to be discharged.        Special note: Every effort is made to have your procedure done on time. Please understand that emergencies sometimes delay scheduled procedures.  2. Diet: Do not eat solid foods after midnight.  You may have clear liquids until 5 AM the day of the procedure.  3. Labs: You will need to have blood drawn today.  You do not need to be fasting.  4. Medication instructions in preparation for your procedure:   Contrast Allergy: No  No Lasix  day of procedure  On the morning of your procedure, take Aspirin 81 mg and any morning medicines NOT listed above.  You may use sips of water.  5. Plan to go home the same day, you will only stay overnight if medically necessary. 6. You MUST have a responsible adult to drive you home. 7. An adult MUST be with you the first 24 hours after you arrive home. 8. Bring a current list of your medications, and the last time and date medication taken. 9. Bring ID and current insurance cards.  10.Please wear clothes that are easy to get on and off and wear slip-on shoes.  Thank you for allowing us  to care for you!   -- [facility]

## 2023-12-26 NOTE — H&P (View-Only) (Signed)
 Chief Complaint  Patient presents with   Follow-up    CAD   History of Present Illness: 68 yo male with history of CAD s/p CABG in 2002 and redo in January 2012, CKD stage 2 and chronic diastolic CHF who is here today for cardiac follow up. He saw Dr. Danise Durie in January 2012 with chest discomfort and was noted to have an abnormal Myoview. He was referred for cardiac catheterization by Dr. Lavonne Prairie. This demonstrated severe graft disease and native three-vessel CAD. He was referred for redo bypass. His LIMA to LAD remained patent. His grafts at his surgery on 09/01/10 with Dr. Luna Salinas included a left radial-PDA and a free RIMA-ramus intermedius. Postoperatively he developed atrial fibrillation with rapid ventricular rate and was placed on amiodarone. Echo March 2022 with normal LV function and no valve disease.  He has been treated with Lasix  for chronic diastolic CHF. He was seen in our office in February 2025 by Dr. Lorie Rook for evaluation of chest pain. PET CT stress test with possible lateral wall ischemia. He was started on Protonix  and Imdur .   He is here today for follow up. He continues to have chest pain with exertion. The patient denies any dyspnea, palpitations, lower extremity edema, orthopnea, PND, dizziness, near syncope or syncope.   Primary Care Physician: Benjiman Bras, MD  Past Medical History:  Diagnosis Date   Atrial fibrillation [facility])    postoperative   Chronic diastolic CHF (congestive heart failure) (HCC)    CKD (chronic kidney disease), stage II    Coronary artery disease    a. s/p CABG 2002. b. s/p redo 2012.   DJD (degenerative joint disease)    Gout    History of ETT    a. ETT 6/16:  normal    Past Surgical History:  Procedure Laterality Date   CORONARY ARTERY BYPASS GRAFT  2002   CORONARY ARTERY BYPASS GRAFT  08-2010   L-LAD remained from original CABG; new grafts incl L radial- PDA + RIMA-RI   VASECTOMY      Current Outpatient Medications   Medication Sig Dispense Refill   albuterol  (VENTOLIN  HFA) 108 (90 Base) MCG/ACT inhaler Inhale 1-2 puffs into the lungs every 6 (six) hours as needed. 8 g 0   allopurinol  (ZYLOPRIM ) 300 MG tablet TAKE 1 TABLET BY MOUTH EVERY DAY 90 tablet 0   aspirin EC 81 MG tablet Take 81 mg by mouth daily. Swallow whole.     cyclobenzaprine  (FLEXERIL ) 10 MG tablet Take 0.5-1 tablets (5-10 mg total) by mouth 3 (three) times daily as needed for muscle spasms. 30 tablet 0   diazepam (VALIUM) 5 MG tablet SMARTSIG:1 Tablet(s) By Mouth     doxycycline  (VIBRA -TABS) 100 MG tablet Take 1 tablet (100 mg total) by mouth 2 (two) times daily. 20 tablet 0   emtricitabine -tenofovir  (TRUVADA ) 200-300 MG tablet TAKE 1 TABLET BY MOUTH 1 TIME A DAY 90 tablet 1   furosemide  (LASIX ) 20 MG tablet Take 1 tablet (20 mg total) by mouth every other day. 45 tablet 3   HYDROcodone  bit-homatropine (HYCODAN) 5-1.5 MG/5ML syrup Take 5 mLs by mouth at bedtime as needed for cough. 120 mL 0   isosorbide  mononitrate (IMDUR ) 30 MG 24 hr tablet Take 1 tablet (30 mg total) by mouth daily. 90 tablet 3   metoprolol  succinate (TOPROL  XL) 25 MG 24 hr tablet Take 1 tablet (25 mg total) by mouth at bedtime. 90 tablet 3   MITIGARE  0.6 MG CAPS TAKE  1 CAPSULE BY MOUTH DAILY AS NEEDED FOR GOUT FLARES 90 capsule 0   Multiple Vitamin (MULTIVITAMIN) tablet Take 1 tablet by mouth daily.     mupirocin  ointment (BACTROBAN ) 2 % Apply 1 Application topically 2 (two) times daily. For 1 week 22 g 0   nitroGLYCERIN  (NITROSTAT ) 0.4 MG SL tablet Place 1 tablet (0.4 mg total) under the tongue every 5 (five) minutes as needed for chest pain. 25 tablet 6   pantoprazole  (PROTONIX ) 20 MG tablet Take 1 tablet (20 mg total) by mouth daily. 90 tablet 1   rosuvastatin  (CRESTOR ) 40 MG tablet TAKE 1 TABLET BY MOUTH EVERY DAY 90 tablet 3   sertraline  (ZOLOFT ) 25 MG tablet TAKE 1 TABLET (25 MG TOTAL) BY MOUTH DAILY. 90 tablet 1   Testosterone  30 MG/ACT SOLN APPLY 1 PUMP EVERY DAY  90 mL 5   valACYclovir  (VALTREX ) 500 MG tablet Take 1 tablet (500 mg total) by mouth 2 (two) times daily. 14 tablet 3   No current facility-administered medications for this visit.    No Known Allergies  Social History   Socioeconomic History   Marital status: Single    Spouse name: Not on file   Number of children: 2   Years of education: Not on file   Highest education level: Professional school degree (e.g., MD, DDS, DVM, JD)  Occupational History   Occupation: Realtor  Tobacco Use   Smoking status: Former    Current packs/day: 0.00    Types: Cigarettes    Quit date: 08/14/1999    Years since quitting: 24.3    Passive exposure: Past   Smokeless tobacco: Never   Tobacco comments:    Social smoker  Vaping Use   Vaping status: Never Used  Substance and Sexual Activity   Alcohol use: Yes    Alcohol/week: 14.0 standard drinks of alcohol    Types: 14 Shots of liquor per week    Comment: 1oz-2oz daily    Drug use: No   Sexual activity: Yes  Other Topics Concern   Not on file  Social History Narrative   Single   Education: College   Exercise: Yes   Social Drivers of Health   Financial Resource Strain: Low Risk  (08/22/2023)   Overall Financial Resource Strain (CARDIA)    Difficulty of Paying Living Expenses: Not hard at all  Food Insecurity: No Food Insecurity (08/22/2023)   Hunger Vital Sign    Worried About Running Out of Food in the Last Year: Never true    Ran Out of Food in the Last Year: Never true  Transportation Needs: No Transportation Needs (08/22/2023)   PRAPARE - Administrator, Civil Service (Medical): No    Lack of Transportation (Non-Medical): No  Physical Activity: Sufficiently Active (08/22/2023)   Exercise Vital Sign    Days of Exercise per Week: 5 days    Minutes of Exercise per Session: 60 min  Stress: No Stress Concern Present (08/22/2023)   [employer] of Occupational Health - Occupational Stress Questionnaire    Feeling of Stress :  Only a little  Social Connections: Moderately Integrated (08/22/2023)   Social Connection and Isolation Panel [NHANES]    Frequency of Communication with Friends and Family: More than three times a week    Frequency of Social Gatherings with Friends and Family: More than three times a week    Attends Religious Services: Never    Database administrator or Organizations: Yes    Attends [employer]  or Organization Meetings: More than 4 times per year    Marital Status: Living with partner  Intimate Partner Violence: Not At Risk (11/22/2022)   Humiliation, Afraid, Rape, and Kick questionnaire    Fear of Current or Ex-Partner: No    Emotionally Abused: No    Physically Abused: No    Sexually Abused: No    Family History  Problem Relation Age of Onset   Heart attack Father 45   Hypertension Father    Cancer Mother        Breast cancer   Cancer Other        family hx of   Coronary artery disease Other        family hx of   Hyperlipidemia Other        family hx of   Hypertension Paternal Grandfather    Healthy Son    Healthy Daughter    Stroke Neg Hx    Colon cancer Neg Hx     Review of Systems:  As stated in the HPI and otherwise negative.   BP 130/78   Pulse 71   Ht 5\' 7"  (1.702 m)   Wt 182 lb 6.4 oz (82.7 kg)   SpO2 97%   BMI 28.57 kg/m   Physical Examination: General: Well developed, well nourished, NAD  HEENT: OP clear, mucus membranes moist  SKIN: warm, dry. No rashes. Neuro: No focal deficits  Musculoskeletal: Muscle strength 5/5 all ext  Psychiatric: Mood and affect normal  Neck: No JVD, no carotid bruits, no thyromegaly, no lymphadenopathy.  Lungs:Clear bilaterally, no wheezes, rhonci, crackles Cardiovascular: Regular rate and rhythm. No murmurs, gallops or rubs. Abdomen:Soft. Bowel sounds present. Non-tender.  Extremities: No lower extremity edema. Pulses are 2 + in the bilateral DP/PT.  EKG:  EKG is ordered today. The ekg ordered today demonstrates  EKG  Interpretation Date/Time:  Thursday Dec 26 2023 14:53:18 EDT Ventricular Rate:  71 PR Interval:  188 QRS Duration:  98 QT Interval:  414 QTC Calculation: 449 R Axis:   22  Text Interpretation: Normal sinus rhythm Normal ECG Confirmed by Antoinette Batman [id number]) on 12/26/2023 2:55:50 PM    Recent Labs: 06/24/2023: ALT 19; BUN 26; Creatinine, Ser 1.28; Hemoglobin 15.3; Platelets 233.0; Potassium 4.4; Sodium 137   Lipid Panel    Component Value Date/Time   CHOL 93 08/26/2023 0900   CHOL 89 (L) 11/02/2020 1114   TRIG 116.0 08/26/2023 0900   HDL 25.10 (L) 08/26/2023 0900   HDL 23 (L) 11/02/2020 1114   CHOLHDL 4 08/26/2023 0900   VLDL 23.2 08/26/2023 0900   LDLCALC 45 08/26/2023 0900   LDLCALC 36 11/02/2020 1114     Wt Readings from Last 3 Encounters:  12/26/23 182 lb 6.4 oz (82.7 kg)  11/28/23 181 lb 3.2 oz (82.2 kg)  11/18/23 183 lb 6.4 oz (83.2 kg)    Assessment and Plan:   1. CAD s/p redo CABG with unstable angina: Abnormal PET CT stress test suggestive of lateral wall ischemia. He is still having exertional chest pain. Cardiac cath is indicated.  I have reviewed the risks, indications, and alternatives to cardiac catheterization, possible angioplasty, and stenting with the patient. Risks include but are not limited to bleeding, infection, vascular injury, stroke, myocardial infection, arrhythmia, kidney injury, radiation-related injury in the case of prolonged fluoroscopy use, emergency cardiac surgery, and death. The patient understands the risks of serious complication is 1-2 in 1000 with diagnostic cardiac cath and 1-2% or less with angioplasty/stenting. Will  arrange cardiac cath at [facility] on 12/31/23 at 9am.  Continue ASA, statin and Imdur .    2. Post-operative atrial fibrillation: No recurrence. Sinus today. Continue beta blocker.    3. Chronic diastolic CHF: Wt is stable. No volume overload on exam. Continue Lasix  every other day.   4. Hyperlipidemia: LDL 45 in  January 2025. Continue statin.    Labs/ tests ordered today include:   Orders Placed This Encounter  Procedures   CBC   Basic metabolic panel with GFR   EKG [id number]   Disposition:   F/U with me in 1-2  months  Signed, Antoinette Batman, MD 12/26/2023 4:11 PM    [facility] [address] [place], [place], [place]  [address] Phone: [phone]; Fax: [phone]

## 2023-12-26 NOTE — Progress Notes (Signed)
 Chief Complaint  Patient presents with   Follow-up    CAD   History of Present Illness: 68 yo male with history of CAD s/p CABG in 2002 and redo in January 2012, CKD stage 2 and chronic diastolic CHF who is here today for cardiac follow up. He saw Dr. Danise Durie in January 2012 with chest discomfort and was noted to have an abnormal Myoview. He was referred for cardiac catheterization by Dr. Lavonne Prairie. This demonstrated severe graft disease and native three-vessel CAD. He was referred for redo bypass. His LIMA to LAD remained patent. His grafts at his surgery on 09/01/10 with Dr. Luna Salinas included a left radial-PDA and a free RIMA-ramus intermedius. Postoperatively he developed atrial fibrillation with rapid ventricular rate and was placed on amiodarone. Echo March 2022 with normal LV function and no valve disease.  He has been treated with Lasix  for chronic diastolic CHF. He was seen in our office in February 2025 by Dr. Lorie Rook for evaluation of chest pain. PET CT stress test with possible lateral wall ischemia. He was started on Protonix  and Imdur .   He is here today for follow up. He continues to have chest pain with exertion. The patient denies any dyspnea, palpitations, lower extremity edema, orthopnea, PND, dizziness, near syncope or syncope.   Primary Care Physician: Benjiman Bras, MD  Past Medical History:  Diagnosis Date   Atrial fibrillation [facility])    postoperative   Chronic diastolic CHF (congestive heart failure) (HCC)    CKD (chronic kidney disease), stage II    Coronary artery disease    a. s/p CABG 2002. b. s/p redo 2012.   DJD (degenerative joint disease)    Gout    History of ETT    a. ETT 6/16:  normal    Past Surgical History:  Procedure Laterality Date   CORONARY ARTERY BYPASS GRAFT  2002   CORONARY ARTERY BYPASS GRAFT  08-2010   L-LAD remained from original CABG; new grafts incl L radial- PDA + RIMA-RI   VASECTOMY      Current Outpatient Medications   Medication Sig Dispense Refill   albuterol  (VENTOLIN  HFA) 108 (90 Base) MCG/ACT inhaler Inhale 1-2 puffs into the lungs every 6 (six) hours as needed. 8 g 0   allopurinol  (ZYLOPRIM ) 300 MG tablet TAKE 1 TABLET BY MOUTH EVERY DAY 90 tablet 0   aspirin EC 81 MG tablet Take 81 mg by mouth daily. Swallow whole.     cyclobenzaprine  (FLEXERIL ) 10 MG tablet Take 0.5-1 tablets (5-10 mg total) by mouth 3 (three) times daily as needed for muscle spasms. 30 tablet 0   diazepam (VALIUM) 5 MG tablet SMARTSIG:1 Tablet(s) By Mouth     doxycycline  (VIBRA -TABS) 100 MG tablet Take 1 tablet (100 mg total) by mouth 2 (two) times daily. 20 tablet 0   emtricitabine -tenofovir  (TRUVADA ) 200-300 MG tablet TAKE 1 TABLET BY MOUTH 1 TIME A DAY 90 tablet 1   furosemide  (LASIX ) 20 MG tablet Take 1 tablet (20 mg total) by mouth every other day. 45 tablet 3   HYDROcodone  bit-homatropine (HYCODAN) 5-1.5 MG/5ML syrup Take 5 mLs by mouth at bedtime as needed for cough. 120 mL 0   isosorbide  mononitrate (IMDUR ) 30 MG 24 hr tablet Take 1 tablet (30 mg total) by mouth daily. 90 tablet 3   metoprolol  succinate (TOPROL  XL) 25 MG 24 hr tablet Take 1 tablet (25 mg total) by mouth at bedtime. 90 tablet 3   MITIGARE  0.6 MG CAPS TAKE  1 CAPSULE BY MOUTH DAILY AS NEEDED FOR GOUT FLARES 90 capsule 0   Multiple Vitamin (MULTIVITAMIN) tablet Take 1 tablet by mouth daily.     mupirocin  ointment (BACTROBAN ) 2 % Apply 1 Application topically 2 (two) times daily. For 1 week 22 g 0   nitroGLYCERIN  (NITROSTAT ) 0.4 MG SL tablet Place 1 tablet (0.4 mg total) under the tongue every 5 (five) minutes as needed for chest pain. 25 tablet 6   pantoprazole  (PROTONIX ) 20 MG tablet Take 1 tablet (20 mg total) by mouth daily. 90 tablet 1   rosuvastatin  (CRESTOR ) 40 MG tablet TAKE 1 TABLET BY MOUTH EVERY DAY 90 tablet 3   sertraline  (ZOLOFT ) 25 MG tablet TAKE 1 TABLET (25 MG TOTAL) BY MOUTH DAILY. 90 tablet 1   Testosterone  30 MG/ACT SOLN APPLY 1 PUMP EVERY DAY  90 mL 5   valACYclovir  (VALTREX ) 500 MG tablet Take 1 tablet (500 mg total) by mouth 2 (two) times daily. 14 tablet 3   No current facility-administered medications for this visit.    No Known Allergies  Social History   Socioeconomic History   Marital status: Single    Spouse name: Not on file   Number of children: 2   Years of education: Not on file   Highest education level: Professional school degree (e.g., MD, DDS, DVM, JD)  Occupational History   Occupation: Realtor  Tobacco Use   Smoking status: Former    Current packs/day: 0.00    Types: Cigarettes    Quit date: 08/14/1999    Years since quitting: 24.3    Passive exposure: Past   Smokeless tobacco: Never   Tobacco comments:    Social smoker  Vaping Use   Vaping status: Never Used  Substance and Sexual Activity   Alcohol use: Yes    Alcohol/week: 14.0 standard drinks of alcohol    Types: 14 Shots of liquor per week    Comment: 1oz-2oz daily    Drug use: No   Sexual activity: Yes  Other Topics Concern   Not on file  Social History Narrative   Single   Education: College   Exercise: Yes   Social Drivers of Health   Financial Resource Strain: Low Risk  (08/22/2023)   Overall Financial Resource Strain (CARDIA)    Difficulty of Paying Living Expenses: Not hard at all  Food Insecurity: No Food Insecurity (08/22/2023)   Hunger Vital Sign    Worried About Running Out of Food in the Last Year: Never true    Ran Out of Food in the Last Year: Never true  Transportation Needs: No Transportation Needs (08/22/2023)   PRAPARE - Administrator, Civil Service (Medical): No    Lack of Transportation (Non-Medical): No  Physical Activity: Sufficiently Active (08/22/2023)   Exercise Vital Sign    Days of Exercise per Week: 5 days    Minutes of Exercise per Session: 60 min  Stress: No Stress Concern Present (08/22/2023)   [employer] of Occupational Health - Occupational Stress Questionnaire    Feeling of Stress :  Only a little  Social Connections: Moderately Integrated (08/22/2023)   Social Connection and Isolation Panel [NHANES]    Frequency of Communication with Friends and Family: More than three times a week    Frequency of Social Gatherings with Friends and Family: More than three times a week    Attends Religious Services: Never    Database administrator or Organizations: Yes    Attends [employer]  or Organization Meetings: More than 4 times per year    Marital Status: Living with partner  Intimate Partner Violence: Not At Risk (11/22/2022)   Humiliation, Afraid, Rape, and Kick questionnaire    Fear of Current or Ex-Partner: No    Emotionally Abused: No    Physically Abused: No    Sexually Abused: No    Family History  Problem Relation Age of Onset   Heart attack Father 45   Hypertension Father    Cancer Mother        Breast cancer   Cancer Other        family hx of   Coronary artery disease Other        family hx of   Hyperlipidemia Other        family hx of   Hypertension Paternal Grandfather    Healthy Son    Healthy Daughter    Stroke Neg Hx    Colon cancer Neg Hx     Review of Systems:  As stated in the HPI and otherwise negative.   BP 130/78   Pulse 71   Ht 5\' 7"  (1.702 m)   Wt 182 lb 6.4 oz (82.7 kg)   SpO2 97%   BMI 28.57 kg/m   Physical Examination: General: Well developed, well nourished, NAD  HEENT: OP clear, mucus membranes moist  SKIN: warm, dry. No rashes. Neuro: No focal deficits  Musculoskeletal: Muscle strength 5/5 all ext  Psychiatric: Mood and affect normal  Neck: No JVD, no carotid bruits, no thyromegaly, no lymphadenopathy.  Lungs:Clear bilaterally, no wheezes, rhonci, crackles Cardiovascular: Regular rate and rhythm. No murmurs, gallops or rubs. Abdomen:Soft. Bowel sounds present. Non-tender.  Extremities: No lower extremity edema. Pulses are 2 + in the bilateral DP/PT.  EKG:  EKG is ordered today. The ekg ordered today demonstrates  EKG  Interpretation Date/Time:  Thursday Dec 26 2023 14:53:18 EDT Ventricular Rate:  71 PR Interval:  188 QRS Duration:  98 QT Interval:  414 QTC Calculation: 449 R Axis:   22  Text Interpretation: Normal sinus rhythm Normal ECG Confirmed by Antoinette Batman [id number]) on 12/26/2023 2:55:50 PM    Recent Labs: 06/24/2023: ALT 19; BUN 26; Creatinine, Ser 1.28; Hemoglobin 15.3; Platelets 233.0; Potassium 4.4; Sodium 137   Lipid Panel    Component Value Date/Time   CHOL 93 08/26/2023 0900   CHOL 89 (L) 11/02/2020 1114   TRIG 116.0 08/26/2023 0900   HDL 25.10 (L) 08/26/2023 0900   HDL 23 (L) 11/02/2020 1114   CHOLHDL 4 08/26/2023 0900   VLDL 23.2 08/26/2023 0900   LDLCALC 45 08/26/2023 0900   LDLCALC 36 11/02/2020 1114     Wt Readings from Last 3 Encounters:  12/26/23 182 lb 6.4 oz (82.7 kg)  11/28/23 181 lb 3.2 oz (82.2 kg)  11/18/23 183 lb 6.4 oz (83.2 kg)    Assessment and Plan:   1. CAD s/p redo CABG with unstable angina: Abnormal PET CT stress test suggestive of lateral wall ischemia. He is still having exertional chest pain. Cardiac cath is indicated.  I have reviewed the risks, indications, and alternatives to cardiac catheterization, possible angioplasty, and stenting with the patient. Risks include but are not limited to bleeding, infection, vascular injury, stroke, myocardial infection, arrhythmia, kidney injury, radiation-related injury in the case of prolonged fluoroscopy use, emergency cardiac surgery, and death. The patient understands the risks of serious complication is 1-2 in 1000 with diagnostic cardiac cath and 1-2% or less with angioplasty/stenting. Will  arrange cardiac cath at [facility] on 12/31/23 at 9am.  Continue ASA, statin and Imdur .    2. Post-operative atrial fibrillation: No recurrence. Sinus today. Continue beta blocker.    3. Chronic diastolic CHF: Wt is stable. No volume overload on exam. Continue Lasix  every other day.   4. Hyperlipidemia: LDL 45 in  January 2025. Continue statin.    Labs/ tests ordered today include:   Orders Placed This Encounter  Procedures   CBC   Basic metabolic panel with GFR   EKG [id number]   Disposition:   F/U with me in 1-2  months  Signed, Antoinette Batman, MD 12/26/2023 4:11 PM    [facility] [address] [place], [place], [place]  [address] Phone: [phone]; Fax: [phone]

## 2023-12-27 ENCOUNTER — Ambulatory Visit: Payer: Self-pay | Admitting: Cardiovascular Disease

## 2023-12-27 LAB — CBC
Hematocrit: 45.9 % (ref 37.5–51.0)
Hemoglobin: 14.8 g/dL (ref 13.0–17.7)
MCH: 30.8 pg (ref 26.6–33.0)
MCHC: 32.2 g/dL (ref 31.5–35.7)
MCV: 96 fL (ref 79–97)
Platelets: 146 10*3/uL — ABNORMAL LOW (ref 150–450)
RBC: 4.8 x10E6/uL (ref 4.14–5.80)
RDW: 12.5 % (ref 11.6–15.4)
WBC: 14 10*3/uL — ABNORMAL HIGH (ref 3.4–10.8)

## 2023-12-27 LAB — BASIC METABOLIC PANEL WITH GFR
BUN/Creatinine Ratio: 15 (ref 10–24)
BUN: 20 mg/dL (ref 8–27)
CO2: 24 mmol/L (ref 20–29)
Calcium: 9.5 mg/dL (ref 8.6–10.2)
Chloride: 103 mmol/L (ref 96–106)
Creatinine, Ser: 1.36 mg/dL — ABNORMAL HIGH (ref 0.76–1.27)
Glucose: 91 mg/dL (ref 70–99)
Potassium: 4.8 mmol/L (ref 3.5–5.2)
Sodium: 143 mmol/L (ref 134–144)
eGFR: 57 mL/min/{1.73_m2} — ABNORMAL LOW (ref >59)

## 2023-12-30 ENCOUNTER — Telehealth: Payer: Self-pay | Admitting: *Deleted

## 2023-12-30 NOTE — Telephone Encounter (Signed)
 Cardiac Catheterization scheduled at [facility] for: Tuesday Dec 31, 2023 9 AM Arrival time [facility] Main Entrance A at: 7 AM  Nothing to eat after midnight prior to procedure, clear liquids until 5 AM day of procedure.  Medication instructions: -Hold:  Lasix -day before and day of procedure-per protocol GFR < 60 (57) -Other usual morning medications can be taken with sips of water including aspirin 81 mg.  Plan to go home the same day, you will only stay overnight if medically necessary.  You must have responsible adult to drive you home.  Someone must be with you the first 24 hours after you arrive home.  Reviewed procedure instructions with patient.  12/26/23 WBC 14.0-pt reports he is afebrile, no signs/symptoms of infection. Pt tells me he has recently been on steroid medication for possible gout flare.

## 2023-12-30 NOTE — Telephone Encounter (Signed)
 He said he was afebrile, no signs/symptoms of infection, had recently been on steroid for possible gout flare. I told him Dr Abel Hoe had reviewed and labs were okay for cath.

## 2023-12-31 ENCOUNTER — Encounter (HOSPITAL_COMMUNITY)
Admission: RE | Disposition: A | Discharge status: Home / Self Care | Payer: Self-pay | Source: Home / Self Care | Attending: Cardiovascular Disease

## 2023-12-31 ENCOUNTER — Encounter (HOSPITAL_COMMUNITY): Payer: Self-pay | Admitting: Cardiovascular Disease

## 2023-12-31 ENCOUNTER — Ambulatory Visit (HOSPITAL_COMMUNITY)
Admission: RE | Admit: 2023-12-31 | Discharge: 2023-12-31 | Disposition: A | Discharge status: Home / Self Care | Attending: Cardiovascular Disease | Admitting: Cardiovascular Disease

## 2023-12-31 ENCOUNTER — Other Ambulatory Visit: Payer: Self-pay

## 2023-12-31 DIAGNOSIS — I25110 Atherosclerotic heart disease of native coronary artery with unstable angina pectoris: Secondary | ICD-10-CM

## 2023-12-31 DIAGNOSIS — I4891 Unspecified atrial fibrillation: Secondary | ICD-10-CM | POA: Diagnosis not present

## 2023-12-31 DIAGNOSIS — Z951 Presence of aortocoronary bypass graft: Secondary | ICD-10-CM | POA: Diagnosis not present

## 2023-12-31 DIAGNOSIS — I5032 Chronic diastolic (congestive) heart failure: Secondary | ICD-10-CM | POA: Insufficient documentation

## 2023-12-31 DIAGNOSIS — E785 Hyperlipidemia, unspecified: Secondary | ICD-10-CM | POA: Insufficient documentation

## 2023-12-31 DIAGNOSIS — Z79899 Other long term (current) drug therapy: Secondary | ICD-10-CM | POA: Diagnosis not present

## 2023-12-31 DIAGNOSIS — Z7982 Long term (current) use of aspirin: Secondary | ICD-10-CM | POA: Diagnosis not present

## 2023-12-31 DIAGNOSIS — Z87891 Personal history of nicotine dependence: Secondary | ICD-10-CM | POA: Diagnosis not present

## 2023-12-31 DIAGNOSIS — I2582 Chronic total occlusion of coronary artery: Secondary | ICD-10-CM | POA: Insufficient documentation

## 2023-12-31 DIAGNOSIS — I25118 Atherosclerotic heart disease of native coronary artery with other forms of angina pectoris: Secondary | ICD-10-CM | POA: Insufficient documentation

## 2023-12-31 HISTORY — PX: LEFT HEART CATH AND CORS/GRAFTS ANGIOGRAPHY: CATH118250

## 2023-12-31 SURGERY — LEFT HEART CATH AND CORS/GRAFTS ANGIOGRAPHY
Anesthesia: LOCAL

## 2023-12-31 MED ORDER — ACETAMINOPHEN 325 MG PO TABS: 650.0000 mg | ORAL_TABLET | ORAL | Status: DC | PRN

## 2023-12-31 MED ORDER — VERAPAMIL HCL 2.5 MG/ML IV SOLN
INTRAVENOUS | Status: AC
  Filled 2023-12-31: qty 2

## 2023-12-31 MED ORDER — SODIUM CHLORIDE 0.9 % WEIGHT BASED INFUSION
3.0000 mL/kg/h | INTRAVENOUS | Status: AC
  Administered 2023-12-31: 3 mL/kg/h via INTRAVENOUS

## 2023-12-31 MED ORDER — MIDAZOLAM HCL 2 MG/2ML IJ SOLN
INTRAMUSCULAR | Status: DC | PRN
  Administered 2023-12-31 (×2): 1 mg via INTRAVENOUS

## 2023-12-31 MED ORDER — IOHEXOL 350 MG/ML SOLN
INTRAVENOUS | Status: DC | PRN
  Administered 2023-12-31: 45 mL

## 2023-12-31 MED ORDER — MIDAZOLAM HCL 2 MG/2ML IJ SOLN
INTRAMUSCULAR | Status: AC
  Filled 2023-12-31: qty 2

## 2023-12-31 MED ORDER — HEPARIN (PORCINE) IN NACL 1000-0.9 UT/500ML-% IV SOLN
INTRAVENOUS | Status: DC | PRN
  Administered 2023-12-31 (×2): 500 mL

## 2023-12-31 MED ORDER — SODIUM CHLORIDE 0.9 % IV SOLN: 250.0000 mL | INTRAVENOUS | Status: DC | PRN

## 2023-12-31 MED ORDER — SODIUM CHLORIDE 0.9% FLUSH: 3.0000 mL | INTRAVENOUS | Status: DC | PRN

## 2023-12-31 MED ORDER — FENTANYL CITRATE (PF) 100 MCG/2ML IJ SOLN
INTRAMUSCULAR | Status: AC
  Filled 2023-12-31: qty 2

## 2023-12-31 MED ORDER — FENTANYL CITRATE (PF) 100 MCG/2ML IJ SOLN
INTRAMUSCULAR | Status: DC | PRN
  Administered 2023-12-31 (×2): 25 ug via INTRAVENOUS

## 2023-12-31 MED ORDER — SODIUM CHLORIDE 0.9 % IV SOLN: INTRAVENOUS | Status: DC

## 2023-12-31 MED ORDER — LIDOCAINE HCL (PF) 1 % IJ SOLN
INTRAMUSCULAR | Status: AC
  Filled 2023-12-31: qty 30

## 2023-12-31 MED ORDER — ASPIRIN 81 MG PO CHEW: 81.0000 mg | CHEWABLE_TABLET | ORAL | Status: DC

## 2023-12-31 MED ORDER — ONDANSETRON HCL 4 MG/2ML IJ SOLN: 4.0000 mg | Freq: Four times a day (QID) | INTRAMUSCULAR | Status: DC | PRN

## 2023-12-31 MED ORDER — HYDRALAZINE HCL 20 MG/ML IJ SOLN: 10.0000 mg | INTRAMUSCULAR | Status: DC | PRN

## 2023-12-31 MED ORDER — LIDOCAINE HCL (PF) 1 % IJ SOLN
INTRAMUSCULAR | Status: DC | PRN
  Administered 2023-12-31: 5 mL via INTRADERMAL

## 2023-12-31 MED ORDER — LABETALOL HCL 5 MG/ML IV SOLN: 10.0000 mg | INTRAVENOUS | Status: DC | PRN

## 2023-12-31 MED ORDER — SODIUM CHLORIDE 0.9 % WEIGHT BASED INFUSION
1.0000 mL/kg/h | INTRAVENOUS | Status: DC
Start: 2023-12-31 — End: 2023-12-31
  Administered 2023-12-31: 1 mL/kg/h via INTRAVENOUS

## 2023-12-31 MED ORDER — SODIUM CHLORIDE 0.9% FLUSH: 3.0000 mL | Freq: Two times a day (BID) | INTRAVENOUS | Status: DC

## 2023-12-31 SURGICAL SUPPLY — 8 items
CATH INFINITI 5FR MULTPACK ANG (CATHETERS) IMPLANT
CLOSURE MYNX CONTROL 5F (Vascular Products) IMPLANT
KIT MICROPUNCTURE NIT STIFF (SHEATH) IMPLANT
PACK CARDIAC CATHETERIZATION (CUSTOM PROCEDURE TRAY) ×1 IMPLANT
SET ATX-X65L (MISCELLANEOUS) IMPLANT
SHEATH PINNACLE 5F 10CM (SHEATH) IMPLANT
SHEATH PROBE COVER 6X72 (BAG) IMPLANT
WIRE EMERALD 3MM-J .035X150CM (WIRE) IMPLANT

## 2023-12-31 NOTE — Interval H&P Note (Signed)
 History and Physical Interval Note:  12/31/2023 8:52 AM  Jeremy Hendricks  has presented today for surgery, with the diagnosis of cad - unstable angina.  The various methods of treatment have been discussed with the patient and family. After consideration of risks, benefits and other options for treatment, the patient has consented to  Procedure(s): LEFT HEART CATH AND CORS/GRAFTS ANGIOGRAPHY (N/A) as a surgical intervention.  The patient's history has been reviewed, patient examined, no change in status, stable for surgery.  I have reviewed the patient's chart and labs.  Questions were answered to the patient's satisfaction.    Cath Lab Visit (complete for each Cath Lab visit)  Clinical Evaluation Leading to the Procedure:   ACS: No.  Non-ACS:    Anginal Classification: CCS III  Anti-ischemic medical therapy: Maximal Therapy (2 or more classes of medications)  Non-Invasive Test Results: High-risk stress test findings: cardiac mortality >3%/year  Prior CABG: Previous CABG        Antoinette Batman

## 2023-12-31 NOTE — Discharge Instructions (Signed)
 Femoral Site Care This sheet gives you information about how to care for yourself after your procedure. Your health care provider may also give you more specific instructions. If you have problems or questions, contact your health care provider. What can I expect after the procedure?  After the procedure, it is common to have: Bruising that usually fades within 1-2 weeks. Tenderness at the site. Follow these instructions at home: Wound care Follow instructions from your health care provider about how to take care of your insertion site. Make sure you: Wash your hands with soap and water before you change your bandage (dressing). If soap and water are not available, use hand sanitizer. Remove your dressing as told by your health care provider. 24 hours Do not take baths, swim, or use a hot tub until your health care provider approves. You may shower 24-48 hours after the procedure or as told by your health care provider. Gently wash the site with plain soap and water. Pat the area dry with a clean towel. Do not rub the site. This may cause bleeding. Do not apply powder or lotion to the site. Keep the site clean and dry. Check your femoral site every day for signs of infection. Check for: Redness, swelling, or pain. Fluid or blood. Warmth. Pus or a bad smell. Activity For the first 2-3 days after your procedure, or as long as directed: Avoid climbing stairs as much as possible. Do not squat. Do not lift anything that is heavier than 10 lb (4.5 kg), or the limit that you are told, until your health care provider says that it is safe. For 5 days Rest as directed. Avoid sitting for a long time without moving. Get up to take short walks every 1-2 hours. Do not drive for 24 hours if you were given a medicine to help you relax (sedative). General instructions Take over-the-counter and prescription medicines only as told by your health care provider. Keep all follow-up visits as told by your  health care provider. This is important. Contact a health care provider if you have: A fever or chills. You have redness, swelling, or pain around your insertion site. Get help right away if: The catheter insertion area swells very fast. You Lwin out. You suddenly start to sweat or your skin gets clammy. The catheter insertion area is bleeding, and the bleeding does not stop when you hold steady pressure on the area. The area near or just beyond the catheter insertion site becomes pale, cool, tingly, or numb. These symptoms may represent a serious problem that is an emergency. Do not wait to see if the symptoms will go away. Get medical help right away. Call your local emergency services (911 in the [place] Do not drive yourself to the hospital. Summary After the procedure, it is common to have bruising that usually fades within 1-2 weeks. Check your femoral site every day for signs of infection. Do not lift anything that is heavier than 10 lb (4.5 kg), or the limit that you are told, until your health care provider says that it is safe. This information is not intended to replace advice given to you by your health care provider. Make sure you discuss any questions you have with your health care provider. Document Revised: 08/12/2017 Document Reviewed: 08/12/2017 Elsevier Patient Education  2020 [employer].

## 2024-01-18 NOTE — Progress Notes (Deleted)
 Office Visit Note  Patient: Jeremy Hendricks             Date of Birth: 18-Jun-1956           MRN: [id number]             PCP: Benjiman Bras, MD Referring: Benjiman Bras, MD Visit Date: 01/31/2024 Occupation: @GUAROCC @  Subjective:  No chief complaint on file.   History of Present Illness: Jeremy Hendricks is a 68 y.o. male ***     Activities of Daily Living:  Patient reports morning stiffness for *** {minute/hour:19697}.   Patient {ACTIONS;DENIES/REPORTS:21021675::"Denies"} nocturnal pain.  Difficulty dressing/grooming: {ACTIONS;DENIES/REPORTS:21021675::"Denies"} Difficulty climbing stairs: {ACTIONS;DENIES/REPORTS:21021675::"Denies"} Difficulty getting out of chair: {ACTIONS;DENIES/REPORTS:21021675::"Denies"} Difficulty using hands for taps, buttons, cutlery, and/or writing: {ACTIONS;DENIES/REPORTS:21021675::"Denies"}  No Rheumatology ROS completed.   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 s/p CABG 2002. b. s/p redo 2012.   DJD (degenerative joint disease)    Gout    History of  ETT    a. ETT 6/16:  normal    Family History  Problem Relation Age of Onset   Heart attack Father 47   Hypertension Father    Cancer Mother        Breast cancer   Cancer Other        family hx of   Coronary artery disease Other        family hx of   Hyperlipidemia Other        family hx of   Hypertension Paternal Grandfather    Healthy Son    Healthy Daughter    Stroke Neg Hx    Colon cancer Neg Hx    Past Surgical History:  Procedure Laterality Date   CORONARY ARTERY BYPASS GRAFT  2002   CORONARY ARTERY BYPASS GRAFT  08-2010   L-LAD remained from original CABG; new grafts incl L radial- PDA + RIMA-RI   LEFT HEART CATH AND CORS/GRAFTS ANGIOGRAPHY N/A 12/31/2023   Procedure: LEFT HEART CATH AND CORS/GRAFTS ANGIOGRAPHY;  Surgeon: Odie Benne, MD;  Location: [facility] INVASIVE CV LAB;  Service: Cardiovascular;  Laterality: N/A;   VASECTOMY     Social History   Social History Narrative   Single   Education: College   Exercise: Yes   Immunization History  Administered Date(s) Administered   Fluad Quad(high Dose 65+) 08/17/2022   Fluad Trivalent(High Dose 65+) 04/09/2023   Hepatitis B 09/20/2000   Influenza Split 05/14/2015, 06/11/2016   Influenza,inj,Quad PF,6+ Mos 04/04/2017, 04/15/2018, 05/05/2019, 04/20/2020  Influenza-Unspecified 04/04/2017   PFIZER Comirnaty(Gray Top)Covid-19 Tri-Sucrose Vaccine 12/29/2020   PFIZER(Purple Top)SARS-COV-2 Vaccination 10/24/2019, 11/17/2019, 06/09/2020, 05/16/2021, 02/14/2022   PNEUMOCOCCAL CONJUGATE-20 02/14/2022   Pfizer Covid-19 Vaccine Bivalent Booster 66yrs & up 05/16/2021   Tdap 02/11/2017   Unspecified SARS-COV-2 Vaccination 06/01/2022   Zoster Recombinant(Shingrix) 08/17/2022   Zoster, Live 08/13/2013     Objective: Vital Signs: There were no vitals taken for this visit.   Physical Exam   Musculoskeletal Exam: ***  CDAI Exam: CDAI Score: -- Patient Global: --; Provider Global: -- Swollen: --; Tender:  -- Joint Exam 01/31/2024   No joint exam has been documented for this visit   There is currently no information documented on the homunculus. Go to the Rheumatology activity and complete the homunculus joint exam.  Investigation: No additional findings.  Imaging: CARDIAC CATHETERIZATION Result Date: 12/31/2023   Dist RCA lesion is 100% stenosed.   Ost Cx to Prox Cx lesion is 99% stenosed.   Dist LM to Prox LAD lesion is 99% stenosed.   RIMA graft was visualized by angiography and is normal in caliber.   Left radial artery graft was visualized by angiography and is normal in caliber.   LIMA graft was visualized by angiography and is normal in caliber. Severe three vessel CAD s/p 3V CABG with 3/3 patent bypass grafts Severe distal left main stenosis involving the ostium of the Circumflex and LAD Patent LIMA to LAD Patent radial artery graft to OM CTO of the mid to distal RCA Patent free RIMA graft to the PDA LVEDP=12 mmHg Recommendations: Continue medical management of CAD    Recent Labs: Lab Results  Component Value Date   WBC 14.0 (H) 12/26/2023   HGB 14.8 12/26/2023   PLT 146 (L) 12/26/2023   NA 143 12/26/2023   K 4.8 12/26/2023   CL 103 12/26/2023   CO2 24 12/26/2023   GLUCOSE 91 12/26/2023   BUN 20 12/26/2023   CREATININE 1.36 (H) 12/26/2023   BILITOT 0.4 06/24/2023   ALKPHOS 53 06/24/2023   AST 20 06/24/2023   ALT 19 06/24/2023   PROT 7.1 06/24/2023   ALBUMIN 4.5 06/24/2023   CALCIUM  9.5 12/26/2023   GFRAA 65 12/08/2020    Speciality Comments: No specialty comments available.  Procedures:  No procedures performed Allergies: Patient has no known allergies.   Assessment / Plan:     Visit Diagnoses: No diagnosis found.  Orders: No orders of the defined types were placed in this encounter.  No orders of the defined types were placed in this encounter.   Face-to-face time spent with patient was *** minutes. Greater than 50% of time was spent in counseling and  coordination of care.  Follow-Up Instructions: No follow-ups on file.   Nicholas Bari, MD  Note - This record has been created using Animal nutritionist.  Chart creation errors have been sought, but may not always  have been located. Such creation errors do not reflect on  the standard of medical care.

## 2024-01-31 ENCOUNTER — Ambulatory Visit: Payer: Medicare PPO | Admitting: Rheumatology

## 2024-01-31 DIAGNOSIS — Z8679 Personal history of other diseases of the circulatory system: Secondary | ICD-10-CM

## 2024-01-31 DIAGNOSIS — Z5181 Encounter for therapeutic drug level monitoring: Secondary | ICD-10-CM

## 2024-01-31 DIAGNOSIS — M1A09X0 Idiopathic chronic gout, multiple sites, without tophus (tophi): Secondary | ICD-10-CM

## 2024-01-31 DIAGNOSIS — M19041 Primary osteoarthritis, right hand: Secondary | ICD-10-CM

## 2024-01-31 DIAGNOSIS — N182 Chronic kidney disease, stage 2 (mild): Secondary | ICD-10-CM

## 2024-01-31 DIAGNOSIS — G8929 Other chronic pain: Secondary | ICD-10-CM

## 2024-01-31 DIAGNOSIS — Z8639 Personal history of other endocrine, nutritional and metabolic disease: Secondary | ICD-10-CM

## 2024-01-31 DIAGNOSIS — I5032 Chronic diastolic (congestive) heart failure: Secondary | ICD-10-CM

## 2024-01-31 DIAGNOSIS — R7989 Other specified abnormal findings of blood chemistry: Secondary | ICD-10-CM

## 2024-01-31 DIAGNOSIS — G4709 Other insomnia: Secondary | ICD-10-CM

## 2024-01-31 DIAGNOSIS — M25551 Pain in right hip: Secondary | ICD-10-CM

## 2024-01-31 DIAGNOSIS — M51369 Other intervertebral disc degeneration, lumbar region without mention of lumbar back pain or lower extremity pain: Secondary | ICD-10-CM

## 2024-01-31 DIAGNOSIS — I25810 Atherosclerosis of coronary artery bypass graft(s) without angina pectoris: Secondary | ICD-10-CM

## 2024-01-31 DIAGNOSIS — G629 Polyneuropathy, unspecified: Secondary | ICD-10-CM

## 2024-01-31 DIAGNOSIS — M19071 Primary osteoarthritis, right ankle and foot: Secondary | ICD-10-CM

## 2024-02-06 ENCOUNTER — Ambulatory Visit: Attending: Emergency Medicine | Admitting: Emergency Medicine

## 2024-02-06 ENCOUNTER — Encounter: Payer: Self-pay | Admitting: Emergency Medicine

## 2024-02-06 VITALS — BP 104/62 | HR 81 | Ht 67.0 in | Wt 181.8 lb

## 2024-02-06 DIAGNOSIS — Z951 Presence of aortocoronary bypass graft: Secondary | ICD-10-CM | POA: Diagnosis not present

## 2024-02-06 DIAGNOSIS — I4891 Unspecified atrial fibrillation: Secondary | ICD-10-CM | POA: Diagnosis not present

## 2024-02-06 DIAGNOSIS — I9789 Other postprocedural complications and disorders of the circulatory system, not elsewhere classified: Secondary | ICD-10-CM

## 2024-02-06 DIAGNOSIS — I10 Essential (primary) hypertension: Secondary | ICD-10-CM

## 2024-02-06 DIAGNOSIS — E785 Hyperlipidemia, unspecified: Secondary | ICD-10-CM | POA: Diagnosis not present

## 2024-02-06 DIAGNOSIS — I25110 Atherosclerotic heart disease of native coronary artery with unstable angina pectoris: Secondary | ICD-10-CM

## 2024-02-06 DIAGNOSIS — I5032 Chronic diastolic (congestive) heart failure: Secondary | ICD-10-CM | POA: Diagnosis not present

## 2024-02-06 DIAGNOSIS — N1831 Chronic kidney disease, stage 3a: Secondary | ICD-10-CM | POA: Diagnosis not present

## 2024-02-06 LAB — BASIC METABOLIC PANEL WITH GFR
BUN/Creatinine Ratio: 11 (ref 10–24)
BUN: 17 mg/dL (ref 8–27)
CO2: 23 mmol/L (ref 20–29)
Calcium: 9.4 mg/dL (ref 8.6–10.2)
Chloride: 100 mmol/L (ref 96–106)
Creatinine, Ser: 1.49 mg/dL — ABNORMAL HIGH (ref 0.76–1.27)
Glucose: 86 mg/dL (ref 70–99)
Potassium: 4.7 mmol/L (ref 3.5–5.2)
Sodium: 141 mmol/L (ref 134–144)
eGFR: 51 mL/min/{1.73_m2} — ABNORMAL LOW (ref >59)

## 2024-02-06 NOTE — Patient Instructions (Signed)
 Medication Instructions:  Your physician recommends that you continue on your current medications as directed. Please refer to the Current Medication list given to you today.  *If you need a refill on your cardiac medications before your next appointment, please call your pharmacy*  Lab Work: BMET today If you have labs (blood work) drawn today and your tests are completely normal, you will receive your results only by: MyChart Message (if you have MyChart) OR A paper copy in the mail If you have any lab test that is abnormal or we need to change your treatment, we will call you to review the results.  Testing/Procedures: NONE  Follow-Up: At [facility], you and your health needs are our priority.  As part of our continuing mission to provide you with exceptional heart care, our providers are all part of one team.  This team includes your primary Cardiologist (physician) and Advanced Practice Providers or APPs (Physician Assistants and Nurse Practitioners) who all work together to provide you with the care you need, when you need it.  Your next appointment:   6 month(s)  Provider:   Lonni Cash, MD    We recommend signing up for the patient portal called MyChart.  Sign up information is provided on this After Visit Summary.  MyChart is used to connect with patients for Virtual Visits (Telemedicine).  Patients are able to view lab/test results, encounter notes, upcoming appointments, etc.  Non-urgent messages can be sent to your provider as well.   To learn more about what you can do with MyChart, go to [email or web address].

## 2024-02-06 NOTE — Progress Notes (Signed)
 Cardiology Office Note:    Date:  02/06/2024  ID:  Jeremy Hendricks, DOB 01-13-1956, MRN [id number] PCP: Levora Reyes SAUNDERS, MD  [facility] Providers Cardiologist:  Jeremy Cash, MD       Patient Profile:       Chief Complaint: Follow-up postcardiac catheterization History of Present Illness:  Jeremy Hendricks is a 68 y.o. male with visit-pertinent history of coronary artery disease s/p CABG in 2002 and redo in January 2012, CKD stage II, chronic diastolic heart failure  In January 2012 he presented with chest discomfort and underwent Myoview that was abnormal.  He was referred for cardiac catheterization that demonstrated severe graft disease and native three-vessel CAD.  He was referred for redo bypass.  His LIMA to LAD remain patent.  His grafts at his surgery on 08/2010 with Dr. Kerrin included a left radial PDA and free RIMA-ramus intermedius.  Postoperatively he developed atrial fibrillation with rapid ventricular rate and was placed on amiodarone.  Echocardiogram March 2022 with normal LV function and no valve disease.  He has been treated with Lasix  for chronic diastolic CHF.  He was seen in office on February 2025 for evaluation of chest pain.  Cardiac PET/CT stress test was ordered that showed possible lateral wall ischemia.  He was started on Protonix  and Imdur .  He was last seen in office on 12/26/2023 he continued to have chest pain with exertion.  He underwent cardiac catheterization on 12/31/2023 showing severe three-vessel CAD with 3/3 patent bypass grafts showing patent LIMA to LAD, patent radial artery graft to OM, CTO of mid to distal RCA, patent free RIMA graft to PDA.  Medical management was recommended.   Discussed the use of AI scribe software for clinical note transcription with the patient, who gave verbal consent to proceed.  History of Present Illness Jeremy Hendricks is a 68 year old male with coronary artery disease who presents today for  follow-up 1 month after cardiac catheterization.  Today he is doing well overall.  Today he denies any anginal symptoms.  He is back to exercising at the gym at least 4 times weekly, this includes cardio and weightlifting.  During exercise he denies any exertional symptoms.  He is without any dyspnea, orthopnea, PND, syncope, presyncope.  Weight has been stable.  Overall he attributes his prior chest pains to acid reflux.  He is happy with his overall health at this time.  Review of systems:  Please see the history of present illness. All other systems are reviewed and otherwise negative.      Studies Reviewed:    EKG Interpretation Date/Time:  Thursday February 06 2024 08:26:47 EDT Ventricular Rate:  83 PR Interval:  194 QRS Duration:  104 QT Interval:  390 QTC Calculation: 458 R Axis:   117  Text Interpretation: Normal sinus rhythm Left posterior fascicular block Possible Inferior infarct , age undetermined When compared with ECG of 26-Dec-2023 14:53, Left posterior fascicular block is now Present Confirmed by Rana Dixon [phone]) on 02/06/2024 7:18:39 PM    Cardiac PET/CT 12/10/2023   Reversible mid to basal lateral wall perfusion defect consistent with ischemia.  Myocardial blood flow reserve is normal, but unreliable in setting of prior CABG.  No high risk findings such as TID or drop in EF with stress.  Overall, study suggests LCX territory ischemia and is intermediate risk   LV perfusion is abnormal. There is evidence of ischemia. Defect 1: There is a medium defect with moderate reduction  in uptake present in the mid to basal lateral location(s) that is reversible. There is normal wall motion in the defect area. Consistent with ischemia. The defect is consistent with abnormal perfusion in the LCx territory.   Rest left ventricular function is normal. Rest EF: 57%. Stress left ventricular function is normal. Stress EF: 59%. End diastolic cavity size is normal. End systolic cavity size  is normal.   Myocardial blood flow was computed to be 0.93ml/g/min at rest and 1.16ml/g/min at stress. Global myocardial blood flow reserve was 2.24 and was normal, but unreliable in setting of prior CABG   Coronary calcium  assessment not performed due to prior revascularization.   Findings are consistent with ischemia. The study is intermediate risk.   Electronically signed by Jeremy Nanas, MD  Cardiac catheterization 12/31/2023   Dist RCA lesion is 100% stenosed.   Ost Cx to Prox Cx lesion is 99% stenosed.   Dist LM to Prox LAD lesion is 99% stenosed.   RIMA graft was visualized by angiography and is normal in caliber.   Left radial artery graft was visualized by angiography and is normal in caliber.   LIMA graft was visualized by angiography and is normal in caliber.   Severe three vessel CAD s/p 3V CABG with 3/3 patent bypass grafts Severe distal left main stenosis involving the ostium of the Circumflex and LAD Patent LIMA to LAD  Patent radial artery graft to OM CTO of the mid to distal RCA Patent free RIMA graft to the PDA LVEDP=12 mmHg   Recommendations: Continue medical management of CAD Diagnostic Dominance: Right  Risk Assessment/Calculations:              Physical Exam:   VS:  BP 104/62   Pulse 81   Ht 5' 7 (1.702 m)   Wt 181 lb 12.8 oz (82.5 kg)   SpO2 95%   BMI 28.47 kg/m    Wt Readings from Last 3 Encounters:  02/06/24 181 lb 12.8 oz (82.5 kg)  12/31/23 175 lb (79.4 kg)  12/26/23 182 lb 6.4 oz (82.7 kg)    GEN: Well nourished, well developed in no acute distress NECK: No JVD; No carotid bruits CARDIAC: RRR, no murmurs, rubs, gallops RESPIRATORY:  Clear to auscultation without rales, wheezing or rhonchi  ABDOMEN: Soft, non-tender, non-distended EXTREMITIES:  No edema; No acute deformity      Assessment and Plan:  Coronary artery disease S/p CABG with LIMA to LAD, [name], VG to PDA in 2002 Redo CABG with left radial to PDA, free RIMA to  ramus in 2012 Cardiac catheterization 12/2023 showed severe 3V CAD with 3/3 patent bypass grafts.  Medical management was recommended EKG today NSR with HR 83 bpm and LPFB - Today he is without any anginal symptoms.  Denies any chest pains.  He is back in the gym performing cardio and weightlifting 4 days a week without exertional chest pains.  He attributed prior chest pains to acid reflux, now on pantoprazole .  There is no indication for further ischemic evaluation at this time - Right groin without pain, swelling, hematoma, or signs of infection - Tells me he is not currently taking isosorbide  - Continue aspirin  81 mg daily, metoprolol  XL 25 mg daily, rosuvastatin  40 mg daily, nitroglycerin  as needed - BMET today  Postoperative atrial fibrillation Developed postoperative atrial fibrillation after CABG in 2012 and has had no reoccurrence since - EKG today shows he is maintaining NSR - Today he denies any symptoms concerning for  recurrent arrhythmia - No indication for AC at this time.  Continue metoprolol  25 mg daily  Chronic diastolic CHF Echocardiogram 11/2020 with LVEF 55 to 60%, no RWMA, grade 2 DD - Today he is euvolemic and well compensated on exam.  Denies any dyspnea, orthopnea, PND, leg swelling.  Weight has been stable - No changes to current regimen.  Continue furosemide  20 mg every other day  Hyperlipidemia LDL 45 on 08/2023 and under excellent control - Continue rosuvastatin  40 mg daily  Hypertension Blood pressure today is 104/61 well-controlled - Continue current antihypertensive regimen  Chronic kidney disease GFR 57, creatinine 1.36 on 12/2023 - Avoid NSAIDs, tight blood pressure control - BMET today      Dispo:  Return in about 6 months (around 08/07/2024).  Signed, Lum LITTIE Louis, NP

## 2024-02-07 ENCOUNTER — Ambulatory Visit: Payer: Self-pay | Admitting: Emergency Medicine

## 2024-02-11 ENCOUNTER — Other Ambulatory Visit: Payer: Self-pay | Admitting: Family Medicine

## 2024-02-11 DIAGNOSIS — Z79899 Other long term (current) drug therapy: Secondary | ICD-10-CM

## 2024-02-11 DIAGNOSIS — Z113 Encounter for screening for infections with a predominantly sexual mode of transmission: Secondary | ICD-10-CM

## 2024-02-14 ENCOUNTER — Other Ambulatory Visit: Payer: Self-pay | Admitting: Cardiovascular Disease

## 2024-02-21 NOTE — Progress Notes (Signed)
 Office Visit Note  Patient: Jeremy Hendricks             Date of Birth: 04-Sep-1955           MRN: [id number]             PCP: Levora Reyes SAUNDERS, MD Referring: Levora Reyes SAUNDERS, MD Visit Date: 03/06/2024 Occupation: @GUAROCC @  Subjective:  Medication management  History of Present Illness: MARKE Hendricks is a 68 y.o. male with gout and osteoarthritis.  Patient denies having a gout flare since the last visit.  He has been on allopurinol  300 mg every other day which he has been tolerating well.  He did not have to take Medicare.  He has been active and exercising on a regular basis.  He denies discomfort in his hands hips and knees today.  He states he has some stiffness in his lower back because he does not do stretches on a regular basis.    Activities of Daily Living:  Patient reports morning stiffness for 5-10 minutes.   Patient Denies nocturnal pain.  Difficulty dressing/grooming: Denies Difficulty climbing stairs: Denies Difficulty getting out of chair: Denies Difficulty using hands for taps, buttons, cutlery, and/or writing: Denies  Review of Systems  Constitutional:  Negative for fatigue.  HENT:  Negative for mouth sores and mouth dryness.   Eyes:  Negative for dryness.  Respiratory:  Negative for shortness of breath.   Cardiovascular:  Negative for chest pain and palpitations.  Gastrointestinal:  Negative for blood in stool, constipation and diarrhea.  Endocrine: Negative for increased urination.  Genitourinary:  Negative for involuntary urination.  Musculoskeletal:  Positive for joint pain, joint pain, myalgias, morning stiffness and myalgias. Negative for gait problem, joint swelling, muscle weakness and muscle tenderness.  Skin:  Negative for color change, rash, hair loss and sensitivity to sunlight.  Allergic/Immunologic: Negative for susceptible to infections.  Neurological:  Negative for dizziness and headaches.  Hematological:  Negative for swollen glands.   Psychiatric/Behavioral:  Negative for depressed mood and sleep disturbance. The patient is not nervous/anxious.     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 s/p CABG 2002. b. s/p redo 2012.   DJD (degenerative joint disease)    Gout    History of ETT    a. ETT 6/16:  normal    Family History  Problem Relation Age of Onset   Heart attack Father 56   Hypertension Father    Cancer Mother  Breast cancer   Cancer Other        family hx of   Coronary artery disease Other        family hx of   Hyperlipidemia Other        family hx of   Hypertension Paternal Grandfather    Healthy Son    Healthy Daughter    Stroke Neg Hx    Colon cancer Neg Hx    Past Surgical History:  Procedure Laterality Date   CORONARY ARTERY BYPASS GRAFT  2002   CORONARY ARTERY BYPASS GRAFT  08-2010   L-LAD remained from original CABG; new grafts incl L radial- PDA + RIMA-RI   LEFT HEART CATH AND CORS/GRAFTS ANGIOGRAPHY N/A 12/31/2023   Procedure: LEFT HEART CATH AND CORS/GRAFTS ANGIOGRAPHY;  Surgeon: Verlin Lonni BIRCH, MD;  Location: [facility];  Service: Cardiovascular;  Laterality: N/A;   VASECTOMY     Social History   Social History Narrative   Single   Education: College   Exercise: Yes   Immunization History  Administered Date(s) Administered   Fluad Quad(high Dose 65+) 08/17/2022   Fluad Trivalent(High Dose 65+) 04/09/2023   Hepatitis B 09/20/2000   Influenza Split 05/14/2015, 06/11/2016   Influenza,inj,Quad PF,6+ Mos 04/04/2017, 04/15/2018, 05/05/2019, 04/20/2020   Influenza-Unspecified 04/04/2017   Moderna Covid-19 Fall Seasonal Vaccine 14yrs & older 06/01/2022   PFIZER Comirnaty(Gray Top)Covid-19 Tri-Sucrose Vaccine 12/29/2020   PFIZER(Purple Top)SARS-COV-2 Vaccination 10/24/2019, 11/17/2019, 06/09/2020, 05/16/2021, 02/14/2022   PNEUMOCOCCAL CONJUGATE-20 02/14/2022   Pfizer Covid-19 Vaccine Bivalent Booster 66yrs & up 05/16/2021, 02/14/2022   Tdap 04/08/2012, 02/11/2017, 10/06/2023   Unspecified SARS-COV-2 Vaccination 06/01/2022   Zoster Recombinant(Shingrix) 08/17/2022, 01/18/2023   Zoster, Live 08/13/2013     Objective: Vital Signs: BP 117/70 (BP Location: Left Arm, Patient Position: Sitting, Cuff Size: Normal)   Pulse (!) 58   Resp 14   Ht 5' 7 (1.702 m)   Wt 182 lb (82.6 kg)   BMI 28.51 kg/m    Physical Exam Vitals and nursing note reviewed.  Constitutional:      Appearance: He is well-developed.  HENT:     Head: Normocephalic and atraumatic.  Eyes:     Conjunctiva/sclera: Conjunctivae normal.     Pupils: Pupils are equal, round, and reactive to light.  Cardiovascular:     Rate and Rhythm: Normal rate and regular rhythm.     Heart sounds: Normal heart sounds.  Pulmonary:     Effort: Pulmonary effort is normal.     Breath sounds: Normal breath sounds.  Abdominal:     General: Bowel sounds are normal.     Palpations: Abdomen is soft.  Musculoskeletal:     Cervical back: Normal range of motion and neck supple.  Skin:    General: Skin is  warm and dry.     Capillary Refill: Capillary refill takes less than 2 seconds.  Neurological:     Mental Status: He is alert and oriented to person, place, and time.  Psychiatric:        Behavior: Behavior normal.      Musculoskeletal Exam: He had limited lateral rotation of the cervical spine.  He had limited mobility in his lumbar spine without any tenderness.  Shoulders, elbows, wrist joints, MCPs PIPs and DIPs with good range of motion.  Bilateral PIP and DIP prominence was noted.  Hip joints and knee joints were in good range of motion without any warmth swelling or effusion.  There was no tenderness over ankles  or MTPs.  CDAI Exam: CDAI Score: -- Patient Global: --; Provider Global: -- Swollen: --; Tender: -- Joint Exam 03/06/2024   No joint exam has been documented for this visit   There is currently no information documented on the homunculus. Go to the Rheumatology activity and complete the homunculus joint exam.  Investigation: No additional findings.  Imaging: No results found.  Recent Labs: Lab Results  Component Value Date   WBC 14.0 (H) 12/26/2023   HGB 14.8 12/26/2023   PLT 146 (L) 12/26/2023   NA 138 02/26/2024   K 4.1 02/26/2024   CL 102 02/26/2024   CO2 25 02/26/2024   GLUCOSE 100 (H) 02/26/2024   BUN 19 02/26/2024   CREATININE 1.34 02/26/2024   BILITOT 0.6 02/26/2024   ALKPHOS 60 02/26/2024   AST 18 02/26/2024   ALT 13 02/26/2024   PROT 7.5 02/26/2024   ALBUMIN 4.9 02/26/2024   CALCIUM  9.4 02/26/2024   GFRAA 65 12/08/2020    Speciality Comments: No specialty comments available.  Procedures:  No procedures performed Allergies: Patient has no known allergies.   Assessment / Plan:     Visit Diagnoses: Idiopathic chronic gout of multiple sites without tophus -he has not had any flares of gout.  He is on allopurinol  300 mg every other day which he tolerates well.  He did not have to take Mitigare .  No synovitis was noted on the examination.  CMP  was normal on February 26, 2024.  Uric acid was 7.5 on July 30, 2023.  Plan: Uric acid in 3 months.  Medication monitoring encounter - Allopurinol  300 mg by mouth every other day. - Plan: CBC with Differential/Platelet, Comprehensive metabolic panel with GFR in 3 months.  Chronic pain of left knee-currently not symptomatic.  Primary osteoarthritis of both hands-he has PIP and DIP thickening with no synovitis.  Pain in right hip-he had good range of motion without discomfort today.  Primary osteoarthritis of both feet-doing well.  Degeneration of intervertebral disc of lumbar region without discogenic back pain or lower extremity pain-he had decreased mobility without discomfort.  Other medical problems are listed as follows:  Neuropathy  CKD (chronic kidney disease) stage 2, GFR 60-89 ml/min-creatinine was 1.34 on February 26, 2024.  Atherosclerosis of coronary artery bypass graft of native heart without angina pectoris  History of atrial fibrillation  History of hyperlipidemia  Other insomnia  Chronic diastolic CHF (congestive heart failure) (HCC)  Orders: Orders Placed This Encounter  Procedures   CBC with Differential/Platelet   Comprehensive metabolic panel with GFR   Uric acid   No orders of the defined types were placed in this encounter.    Follow-Up Instructions: Return in about 6 months (around 09/06/2024) for Gout, Osteoarthritis.   Maya Nash, MD  Note - This record has been created using Animal nutritionist.  Chart creation errors have been sought, but may not always  have been located. Such creation errors do not reflect on  the standard of medical care.

## 2024-02-24 ENCOUNTER — Other Ambulatory Visit: Payer: Self-pay | Admitting: Family Medicine

## 2024-02-24 DIAGNOSIS — R7989 Other specified abnormal findings of blood chemistry: Secondary | ICD-10-CM

## 2024-02-24 DIAGNOSIS — E291 Testicular hypofunction: Secondary | ICD-10-CM

## 2024-02-26 ENCOUNTER — Ambulatory Visit (INDEPENDENT_AMBULATORY_CARE_PROVIDER_SITE_OTHER): Payer: Medicare PPO | Admitting: Family Medicine

## 2024-02-26 ENCOUNTER — Encounter: Payer: Self-pay | Admitting: Family Medicine

## 2024-02-26 ENCOUNTER — Other Ambulatory Visit (HOSPITAL_COMMUNITY)
Admission: RE | Admit: 2024-02-26 | Discharge: 2024-02-26 | Disposition: A | Discharge status: Home / Self Care | Source: Ambulatory Visit | Attending: Family Medicine | Admitting: Family Medicine

## 2024-02-26 VITALS — BP 100/60 | HR 60 | Temp 97.9°F | Ht 67.0 in | Wt 182.0 lb

## 2024-02-26 DIAGNOSIS — I5032 Chronic diastolic (congestive) heart failure: Secondary | ICD-10-CM | POA: Diagnosis not present

## 2024-02-26 DIAGNOSIS — Z79899 Other long term (current) drug therapy: Secondary | ICD-10-CM | POA: Diagnosis not present

## 2024-02-26 DIAGNOSIS — N1831 Chronic kidney disease, stage 3a: Secondary | ICD-10-CM

## 2024-02-26 DIAGNOSIS — R12 Heartburn: Secondary | ICD-10-CM

## 2024-02-26 DIAGNOSIS — Z113 Encounter for screening for infections with a predominantly sexual mode of transmission: Secondary | ICD-10-CM | POA: Insufficient documentation

## 2024-02-26 DIAGNOSIS — Z0000 Encounter for general adult medical examination without abnormal findings: Secondary | ICD-10-CM | POA: Diagnosis not present

## 2024-02-26 DIAGNOSIS — E291 Testicular hypofunction: Secondary | ICD-10-CM

## 2024-02-26 DIAGNOSIS — I25810 Atherosclerosis of coronary artery bypass graft(s) without angina pectoris: Secondary | ICD-10-CM | POA: Diagnosis not present

## 2024-02-26 DIAGNOSIS — Z0184 Encounter for antibody response examination: Secondary | ICD-10-CM | POA: Diagnosis not present

## 2024-02-26 DIAGNOSIS — Z5181 Encounter for therapeutic drug level monitoring: Secondary | ICD-10-CM

## 2024-02-26 LAB — COMPREHENSIVE METABOLIC PANEL WITH GFR
ALT: 13 U/L (ref 0–53)
AST: 18 U/L (ref 0–37)
Albumin: 4.9 g/dL (ref 3.5–5.2)
Alkaline Phosphatase: 60 U/L (ref 39–117)
BUN: 19 mg/dL (ref 6–23)
CO2: 25 meq/L (ref 19–32)
Calcium: 9.4 mg/dL (ref 8.4–10.5)
Chloride: 102 meq/L (ref 96–112)
Creatinine, Ser: 1.34 mg/dL (ref 0.40–1.50)
GFR: 54.92 mL/min — ABNORMAL LOW (ref >60.00)
Glucose, Bld: 100 mg/dL — ABNORMAL HIGH (ref 70–99)
Potassium: 4.1 meq/L (ref 3.5–5.1)
Sodium: 138 meq/L (ref 135–145)
Total Bilirubin: 0.6 mg/dL (ref 0.2–1.2)
Total Protein: 7.5 g/dL (ref 6.0–8.3)

## 2024-02-26 LAB — PSA: PSA: 1.79 ng/mL (ref 0.10–4.00)

## 2024-02-26 LAB — URINE CYTOLOGY ANCILLARY ONLY
Chlamydia: NEGATIVE
Comment: NEGATIVE
Comment: NEGATIVE
Comment: NORMAL
Neisseria Gonorrhea: NEGATIVE
Trichomonas: NEGATIVE

## 2024-02-26 LAB — TESTOSTERONE: Testosterone: 627.93 ng/dL (ref 300.00–890.00)

## 2024-02-26 LAB — LIPID PANEL
Cholesterol: 89 mg/dL (ref 0–200)
HDL: 25.7 mg/dL — ABNORMAL LOW (ref >39.00)
LDL Cholesterol: 42 mg/dL (ref 0–99)
NonHDL: 63.66
Total CHOL/HDL Ratio: 3
Triglycerides: 108 mg/dL (ref 0.0–149.0)
VLDL: 21.6 mg/dL (ref 0.0–40.0)

## 2024-02-26 NOTE — Patient Instructions (Addendum)
 Furosemide  10mg  per day may be reasonable - but recommend discussing with cardiology as balancing between fluid management and kidney function.   Thank you for coming in today. No change in medications at this time. If there are any concerns on your bloodwork, I will let you know. Take care!  Preventive Care 55 Years and Older, Male Preventive care refers to lifestyle choices and visits with your health care provider that can promote health and wellness. Preventive care visits are also called wellness exams. What can I expect for my preventive care visit? Counseling During your preventive care visit, your health care provider may ask about your: Medical history, including: Past medical problems. Family medical history. History of falls. Current health, including: Emotional well-being. Home life and relationship well-being. Sexual activity. Memory and ability to understand (cognition). Lifestyle, including: Alcohol, nicotine or tobacco, and drug use. Access to firearms. Diet, exercise, and sleep habits. Work and work Astronomer. Sunscreen use. Safety issues such as seatbelt and bike helmet use. Physical exam Your health care provider will check your: Height and weight. These may be used to calculate your BMI (body mass index). BMI is a measurement that tells if you are at a healthy weight. Waist circumference. This measures the distance around your waistline. This measurement also tells if you are at a healthy weight and may help predict your risk of certain diseases, such as type 2 diabetes and high blood pressure. Heart rate and blood pressure. Body temperature. Skin for abnormal spots. What immunizations do I need?  Vaccines are usually given at various ages, according to a schedule. Your health care provider will recommend vaccines for you based on your age, medical history, and lifestyle or other factors, such as travel or where you work. What tests do I need? Screening Your  health care provider may recommend screening tests for certain conditions. This may include: Lipid and cholesterol levels. Diabetes screening. This is done by checking your blood sugar (glucose) after you have not eaten for a while (fasting). Hepatitis C test. Hepatitis B test. HIV (human immunodeficiency virus) test. STI (sexually transmitted infection) testing, if you are at risk. Lung cancer screening. Colorectal cancer screening. Prostate cancer screening. Abdominal aortic aneurysm (AAA) screening. You may need this if you are a current or former smoker. Talk with your health care provider about your test results, treatment options, and if necessary, the need for more tests. Follow these instructions at home: Eating and drinking  Eat a diet that includes fresh fruits and vegetables, whole grains, lean protein, and low-fat dairy products. Limit your intake of foods with high amounts of sugar, saturated fats, and salt. Take vitamin and mineral supplements as recommended by your health care provider. Do not drink alcohol if your health care provider tells you not to drink. If you drink alcohol: Limit how much you have to 0-2 drinks a day. Know how much alcohol is in your drink. In the [place] one drink equals one 12 oz bottle of beer (355 mL), one 5 oz glass of wine (148 mL), or one 1 oz glass of hard liquor (44 mL). Lifestyle Brush your teeth every morning and night with fluoride toothpaste. Floss one time each day. Exercise for at least 30 minutes 5 or more days each week. Do not use any products that contain nicotine or tobacco. These products include cigarettes, chewing tobacco, and vaping devices, such as e-cigarettes. If you need help quitting, ask your health care provider. Do not use drugs. If you are sexually  active, practice safe sex. Use a condom or other form of protection to prevent STIs. Take aspirin  only as told by your health care provider. Make sure that you understand how  much to take and what form to take. Work with your health care provider to find out whether it is safe and beneficial for you to take aspirin  daily. Ask your health care provider if you need to take a cholesterol-lowering medicine (statin). Find healthy ways to manage stress, such as: Meditation, yoga, or listening to music. Journaling. Talking to a trusted person. Spending time with friends and family. Safety Always wear your seat belt while driving or riding in a vehicle. Do not drive: If you have been drinking alcohol. Do not ride with someone who has been drinking. When you are tired or distracted. While texting. If you have been using any mind-altering substances or drugs. Wear a helmet and other protective equipment during sports activities. If you have firearms in your house, make sure you follow all gun safety procedures. Minimize exposure to UV radiation to reduce your risk of skin cancer. What's next? Visit your health care provider once a year for an annual wellness visit. Ask your health care provider how often you should have your eyes and teeth checked. Stay up to date on all vaccines. This information is not intended to replace advice given to you by your health care provider. Make sure you discuss any questions you have with your health care provider. Document Revised: 01/25/2021 Document Reviewed: 01/25/2021 Elsevier Patient Education  2024 [employer].

## 2024-02-26 NOTE — Progress Notes (Signed)
 Subjective:  Patient ID: Jeremy Hendricks, male    DOB: [birth year]  Age: 68 y.o. MRN: [id number]  CC:  Chief Complaint  Patient presents with   Annual Exam    Physical; no concerns    HPI [name] presents for Annual Exam  PCP: [facility], Dr. Verlin.  History of CAD, admission May 20, left heart cath with three-vessel CAD, 3 out of 3 patent bypass grafts.  Continue medical management.  Chest pain thought to be related to reflux, treated with pantoprazole .  Asymptomatic at follow-up visit June 26.  Continue on aspirin  metoprolol  rosuvastatin  and nitroglycerin  as needed.  Remote history of atrial fibrillation after CABG in 2012 without recurrence, no indication for anticoagulation.  Chronic diastolic CHF, euvolemic on June 26 visit, asymptomatic, furosemide  20 mg every other day.  52-month follow-up. Minimal symptoms with cardio - mild discomfort with cardio - no need for imdur .  Rheumatology, [facility], appointment with Waddell Craze in December.  Gout, osteoarthritis.  Allopurinol  300 mg every other day.  Mitigare  if needed for flares.  Uric acid 7.5, recommended increasing allopurinol  to daily. Taking daily now. No recent flare.   Preexposure prophylaxis Treated with Truvada . No new side effects.  3 new partners since last sti screening. No STI symptoms.  Lab Results  Component Value Date   CREATININE 1.49 (H) 02/06/2024   Hyperlipidemia: Crestor  40 mg daily. No new myalgias.  Lab Results  Component Value Date   CHOL 93 08/26/2023   HDL 25.10 (L) 08/26/2023   LDLCALC 45 08/26/2023   TRIG 116.0 08/26/2023   CHOLHDL 4 08/26/2023   Lab Results  Component Value Date   ALT 19 06/24/2023   AST 20 06/24/2023   ALKPHOS 53 06/24/2023   BILITOT 0.4 06/24/2023   GERD Seen for chest pain as above, with reassuring workup.  Protonix  20 mg daily. No recurrence of reflux.    Hypogonadism Treated with testosterone  supplementation.  30 mg solution 1 pump per  day. Working ok. Workouts going well.  Lab Results  Component Value Date   TESTOSTERONE  624 07/30/2023   Lab Results  Component Value Date   PSA1 1.5 05/23/2020   PSA1 1.4 03/12/2019   PSA1 1.8 04/15/2018   PSA 1.54 08/26/2023   PSA 1.75 07/09/2022   PSA 2.35 12/28/2021    Lab Results  Component Value Date   ALT 19 06/24/2023   AST 20 06/24/2023   ALKPHOS 53 06/24/2023   BILITOT 0.4 06/24/2023   Lab Results  Component Value Date   WBC 14.0 (H) 12/26/2023   HGB 14.8 12/26/2023   HCT 45.9 12/26/2023   MCV 96 12/26/2023   PLT 146 (L) 12/26/2023   Lab Results  Component Value Date   HGBA1C 5.6 11/01/2021    History of anxiety treated with sertraline  low-dose previously, improved and was off meds, stable in January. Some stress with work - has sertraline , has not restarted.      02/26/2024    8:05 AM 08/26/2023    8:22 AM 06/24/2023    3:09 PM 05/06/2023    3:51 PM 11/22/2022    8:44 AM  Depression screen PHQ 2/9  Decreased Interest 0 0 1 1 0  Down, Depressed, Hopeless 0 0 1 1 0  PHQ - 2 Score 0 0 2 2 0  Altered sleeping 1 1 0 0 1  Tired, decreased energy 1 1 1 1  0  Change in appetite 1 0 0 0 0  Feeling  bad or failure about yourself  1 0 0 0 0  Trouble concentrating 0 0 0 0 0  Moving slowly or fidgety/restless 0 0 0 0 0  Suicidal thoughts 0 0 0 0 0  PHQ-9 Score 4 2 3 3 1   Difficult doing work/chores Not difficult at all  Not difficult at all Somewhat difficult Not difficult at all    Health Maintenance  Topic Date Due   Hepatitis B Vaccines (2 of 3 - 19+ 3-dose series) 10/18/2000   COVID-19 Vaccine (8 - 2024-25 season) 04/14/2023   Medicare Annual Wellness (AWV)  11/22/2023   INFLUENZA VACCINE  03/13/2024   Colonoscopy  07/02/2027   DTaP/Tdap/Td (4 - Td or Tdap) 10/05/2033   Pneumococcal Vaccine: 50+ Years  Completed   Hepatitis C Screening  Completed   Zoster Vaccines- Shingrix  Completed   HPV VACCINES  Aged Out   Meningococcal B Vaccine  Aged Out   Colonoscopy in November 2018.  Repeat 10 years Prostate: Regular monitoring with use of testosterone .   Lab Results  Component Value Date   PSA1 1.5 05/23/2020   PSA1 1.4 03/12/2019   PSA1 1.8 04/15/2018   PSA 1.54 08/26/2023   PSA 1.75 07/09/2022   PSA 2.35 12/28/2021    Immunization History  Administered Date(s) Administered   Fluad Quad(high Dose 65+) 08/17/2022   Fluad Trivalent(High Dose 65+) 04/09/2023   Hepatitis B 09/20/2000   Influenza Split 05/14/2015, 06/11/2016   Influenza,inj,Quad PF,6+ Mos 04/04/2017, 04/15/2018, 05/05/2019, 04/20/2020   Influenza-Unspecified 04/04/2017   Moderna Covid-19 Fall Seasonal Vaccine 81yrs & older 06/01/2022   PFIZER Comirnaty(Gray Top)Covid-19 Tri-Sucrose Vaccine 12/29/2020   PFIZER(Purple Top)SARS-COV-2 Vaccination 10/24/2019, 11/17/2019, 06/09/2020, 05/16/2021, 02/14/2022   PNEUMOCOCCAL CONJUGATE-20 02/14/2022   Pfizer Covid-19 Vaccine Bivalent Booster 74yrs & up 05/16/2021, 02/14/2022   Tdap 04/08/2012, 02/11/2017, 10/06/2023   Unspecified SARS-COV-2 Vaccination 06/01/2022   Zoster Recombinant(Shingrix) 08/17/2022, 01/18/2023   Zoster, Live 08/13/2013  Covid booster and flu vaccine in fall Had hep B vaccine in past.   No results found. Wears glasses - reading. No recent visit. Recommended.   Dental: every 4 months.   Alcohol: 2 ounces per day. No recent changes. Denies feeling addicted.   Tobacco: none, sometime CBD gummies to sleep.   Exercise: 4-5 days per week. Cardio and weights.    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 s/p CABG 2002. b. s/p redo 2012.   DJD (degenerative joint disease)    Gout    History of ETT    a.  ETT 6/16:  normal   Past Surgical History:  Procedure Laterality Date   CORONARY ARTERY BYPASS GRAFT  2002   CORONARY ARTERY BYPASS GRAFT  08-2010   L-LAD remained from original CABG; new grafts incl L radial- PDA + RIMA-RI   LEFT HEART CATH AND CORS/GRAFTS ANGIOGRAPHY N/A 12/31/2023   Procedure: LEFT HEART CATH AND CORS/GRAFTS ANGIOGRAPHY;  Surgeon: Verlin Lonni BIRCH, MD;  Location: [facility];  Service: Cardiovascular;  Laterality: N/A;   VASECTOMY     No Known Allergies Prior to Admission medications   Medication Sig Start Date End Date Taking? Authorizing Provider  albuterol  (VENTOLIN  HFA) 108 (90 Base) MCG/ACT inhaler Inhale 1-2 puffs into the lungs every 6 (six) hours as needed. 06/17/23  Yes Vivienne Delon HERO, PA-C  allopurinol  (ZYLOPRIM ) 300 MG tablet TAKE 1 TABLET BY MOUTH EVERY DAY 12/12/23  Yes Deveshwar, Maya, MD  aspirin  EC 81 MG tablet Take 81 mg by mouth daily. Swallow whole.   Yes [provider]  cyclobenzaprine  (FLEXERIL ) 10 MG tablet Take 0.5-1 tablets (5-10 mg total) by mouth 3 (three) times daily as needed for muscle spasms. 11/28/23  Yes Levora Reyes SAUNDERS, MD  diazepam (VALIUM) 5 MG tablet Take 5 mg by mouth every 12 (twelve) hours as needed for anxiety. 06/05/23  Yes [provider]  emtricitabine -tenofovir  (TRUVADA ) 200-300 MG tablet TAKE 1  TABLET BY MOUTH EVERY DAY 02/11/24  Yes Levora Reyes SAUNDERS, MD  furosemide  (LASIX ) 20 MG tablet Take 1 tablet (20 mg total) by mouth every other day. 08/26/23  Yes Levora Reyes SAUNDERS, MD  HYDROcodone  bit-homatropine [facility]) 5-1.5 MG/5ML syrup Take 5 mLs by mouth at bedtime as needed for cough. 09/23/23  Yes Levora Reyes SAUNDERS, MD  isosorbide  mononitrate (IMDUR ) 30 MG 24 hr tablet Take 1 tablet (30 mg total) by mouth daily. 10/04/23  Yes Thukkani, Arun K, MD  metoprolol  succinate (TOPROL  XL) 25 MG 24 hr tablet Take 1 tablet (25 mg total) by mouth at bedtime. 10/04/23  Yes Thukkani, Arun K, MD  MITIGARE  0.6 MG CAPS TAKE 1 CAPSULE BY MOUTH DAILY AS NEEDED FOR GOUT FLARES 04/22/23  Yes Cheryl Waddell HERO, PA-C  Multiple Vitamin (MULTIVITAMIN) tablet Take 1 tablet by mouth daily.   Yes [provider]  mupirocin  ointment (BACTROBAN ) 2 % Apply 1 Application topically 2 (two) times daily. For 1 week 11/18/23  Yes Levora Reyes SAUNDERS, MD  nitroGLYCERIN  (NITROSTAT ) 0.4 MG SL tablet Place 1 tablet (0.4 mg total) under the tongue every 5 (five) minutes as needed for chest pain. 10/04/23  Yes Thukkani, Arun K, MD  pantoprazole  (PROTONIX ) 20 MG tablet Take 1 tablet (20 mg total) by mouth daily. 10/04/23  Yes Thukkani, Arun K, MD  rosuvastatin  (CRESTOR ) 40 MG tablet TAKE 1 TABLET BY MOUTH EVERY DAY 12/02/23  Yes Thukkani, Arun K, MD  sertraline  (ZOLOFT ) 25 MG tablet TAKE 1 TABLET (25 MG TOTAL) BY MOUTH DAILY. 12/02/23  Yes Levora Reyes SAUNDERS, MD  Testosterone  30 MG/ACT SOLN APPLY 1 PUMP EVERY DAY 02/24/24  Yes Levora Reyes SAUNDERS, MD  valACYclovir  (VALTREX ) 500 MG tablet Take 1 tablet (500 mg total) by mouth 2 (two) times daily. 08/26/23  Yes Levora Reyes SAUNDERS, MD  doxycycline  (VIBRA -TABS) 100 MG tablet Take 1 tablet (100 mg total) by mouth 2 (two) times daily. Patient not taking: Reported on 02/26/2024 11/18/23   Levora Reyes SAUNDERS, MD   Social History   Socioeconomic History   Marital status: Single    Spouse  name: Not on file    Number of children: 2   Years of education: Not on file   Highest education level: Professional school degree (e.g., MD, DDS, DVM, JD)  Occupational History   Occupation: Realtor  Tobacco Use   Smoking status: Former    Current packs/day: 0.00    Types: Cigarettes    Quit date: 08/14/1999    Years since quitting: 24.5    Passive exposure: Past   Smokeless tobacco: Never   Tobacco comments:    Social smoker  Vaping Use   Vaping status: Never Used  Substance and Sexual Activity   Alcohol use: Yes    Alcohol/week: 14.0 standard drinks of alcohol    Types: 14 Shots of liquor per week    Comment: 1oz-2oz daily    Drug use: No   Sexual activity: Yes  Other Topics Concern   Not on file  Social History Narrative   Single   Education: College   Exercise: Yes   Social Drivers of Health   Financial Resource Strain: Low Risk  (02/22/2024)   Overall Financial Resource Strain (CARDIA)    Difficulty of Paying Living Expenses: Not very hard  Food Insecurity: No Food Insecurity (02/22/2024)   Hunger Vital Sign    Worried About Running Out of Food in the Last Year: Never true    Ran Out of Food in the Last Year: Never true  Transportation Needs: No Transportation Needs (02/22/2024)   PRAPARE - Administrator, Civil Service (Medical): No    Lack of Transportation (Non-Medical): No  Physical Activity: Sufficiently Active (08/22/2023)   Exercise Vital Sign    Days of Exercise per Week: 5 days    Minutes of Exercise per Session: 60 min  Stress: No Stress Concern Present (08/22/2023)   [employer] of Occupational Health - Occupational Stress Questionnaire    Feeling of Stress : Only a little  Social Connections: Unknown (02/22/2024)   Social Connection and Isolation Panel    Frequency of Communication with Friends and Family: More than three times a week    Frequency of Social Gatherings with Friends and Family: More than three times a week    Attends Religious Services:  More than 4 times per year    Active Member of [employer] or Organizations: Yes    Attends Banker Meetings: More than 4 times per year    Marital Status: Patient declined  Intimate Partner Violence: Not At Risk (11/22/2022)   Humiliation, Afraid, Rape, and Kick questionnaire    Fear of Current or Ex-Partner: No    Emotionally Abused: No    Physically Abused: No    Sexually Abused: No    Review of Systems 13 point review of systems per patient health survey noted.  Negative other than as indicated above or in HPI.    Objective:   Vitals:   02/26/24 0759  BP: 100/60  Pulse: 60  Temp: 97.9 F (36.6 C)  SpO2: 99%  Weight: 182 lb (82.6 kg)  Height: 5' 7 (1.702 m)     Physical Exam Vitals reviewed.  Constitutional:      Appearance: He is well-developed.  HENT:     Head: Normocephalic and atraumatic.     Right Ear: External ear normal.     Left Ear: External ear normal.  Eyes:     Conjunctiva/sclera: Conjunctivae normal.     Pupils: Pupils are equal, round, and reactive to light.  Neck:  Thyroid: No thyromegaly.  Cardiovascular:     Rate and Rhythm: Normal rate and regular rhythm.     Heart sounds: Normal heart sounds.  Pulmonary:     Effort: Pulmonary effort is normal. No respiratory distress.     Breath sounds: Normal breath sounds. No wheezing.  Abdominal:     General: There is no distension.     Palpations: Abdomen is soft.     Tenderness: There is no abdominal tenderness.  Musculoskeletal:        General: No tenderness. Normal range of motion.     Cervical back: Normal range of motion and neck supple.  Lymphadenopathy:     Cervical: No cervical adenopathy.  Skin:    General: Skin is warm and dry.  Neurological:     Mental Status: He is alert and oriented to person, place, and time.     Deep Tendon Reflexes: Reflexes are normal and symmetric.  Psychiatric:        Behavior: Behavior normal.     Assessment & Plan:  ANSELM AUMILLER is a 68  y.o. male . Annual physical exam  - -anticipatory guidance as below in AVS, screening labs above. Health maintenance items as above in HPI discussed/recommended as applicable.   Immunity status testing - Plan: Hepatitis B surface antibody,quantitative Check titer, consider vaccine if nonimmune.  Hypogonadism in male - Plan: Comprehensive metabolic panel with GFR, Testosterone , PSA Medication monitoring encounter - Plan: Comprehensive metabolic panel with GFR, PSA  - Check testosterone  level and monitoring labs, no med changes at this time, adjustment of plan accordingly based on results.  On pre-exposure prophylaxis for HIV - Plan: HIV Antibody (routine testing w rflx) Routine screening for STI (sexually transmitted infection) - Plan: HIV Antibody (routine testing w rflx), RPR, Urine cytology ancillary only  - Check STI screening, continue Truvada .  Continue to monitor renal function with use of Truvada .  Heartburn  - With recent evaluation for chest pain as above, reassuring, stable with use of PPI, no changes.  CKD stage 3a, GFR 45-59 ml/min (HCC)  - Monitor renal function with balancing use of diuretic for CHF.  Atherosclerosis of coronary artery bypass graft of native heart without angina pectoris - Plan: Lipid panel Chronic diastolic CHF (congestive heart failure) (HCC)  Reassuring recent evaluation with three-vessel bypass as above, patent.  Option of switching from 20 mg Lasix  every other day to 10 mg daily but recommended discussion with his cardiologist.  No orders of the defined types were placed in this encounter.  Patient Instructions  Furosemide  10mg  per day may be reasonable - but recommend discussing with cardiology as balancing between fluid management and kidney function.   Thank you for coming in today. No change in medications at this time. If there are any concerns on your bloodwork, I will let you know. Take care!  Preventive Care 55 Years and Older,  Male Preventive care refers to lifestyle choices and visits with your health care provider that can promote health and wellness. Preventive care visits are also called wellness exams. What can I expect for my preventive care visit? Counseling During your preventive care visit, your health care provider may ask about your: Medical history, including: Past medical problems. Family medical history. History of falls. Current health, including: Emotional well-being. Home life and relationship well-being. Sexual activity. Memory and ability to understand (cognition). Lifestyle, including: Alcohol, nicotine or tobacco, and drug use. Access to firearms. Diet, exercise, and sleep habits. Work and work Astronomer. Sunscreen  use. Safety issues such as seatbelt and bike helmet use. Physical exam Your health care provider will check your: Height and weight. These may be used to calculate your BMI (body mass index). BMI is a measurement that tells if you are at a healthy weight. Waist circumference. This measures the distance around your waistline. This measurement also tells if you are at a healthy weight and may help predict your risk of certain diseases, such as type 2 diabetes and high blood pressure. Heart rate and blood pressure. Body temperature. Skin for abnormal spots. What immunizations do I need?  Vaccines are usually given at various ages, according to a schedule. Your health care provider will recommend vaccines for you based on your age, medical history, and lifestyle or other factors, such as travel or where you work. What tests do I need? Screening Your health care provider may recommend screening tests for certain conditions. This may include: Lipid and cholesterol levels. Diabetes screening. This is done by checking your blood sugar (glucose) after you have not eaten for a while (fasting). Hepatitis C test. Hepatitis B test. HIV (human immunodeficiency virus) test. STI  (sexually transmitted infection) testing, if you are at risk. Lung cancer screening. Colorectal cancer screening. Prostate cancer screening. Abdominal aortic aneurysm (AAA) screening. You may need this if you are a current or former smoker. Talk with your health care provider about your test results, treatment options, and if necessary, the need for more tests. Follow these instructions at home: Eating and drinking  Eat a diet that includes fresh fruits and vegetables, whole grains, lean protein, and low-fat dairy products. Limit your intake of foods with high amounts of sugar, saturated fats, and salt. Take vitamin and mineral supplements as recommended by your health care provider. Do not drink alcohol if your health care provider tells you not to drink. If you drink alcohol: Limit how much you have to 0-2 drinks a day. Know how much alcohol is in your drink. In the [place] one drink equals one 12 oz bottle of beer (355 mL), one 5 oz glass of wine (148 mL), or one 1 oz glass of hard liquor (44 mL). Lifestyle Brush your teeth every morning and night with fluoride toothpaste. Floss one time each day. Exercise for at least 30 minutes 5 or more days each week. Do not use any products that contain nicotine or tobacco. These products include cigarettes, chewing tobacco, and vaping devices, such as e-cigarettes. If you need help quitting, ask your health care provider. Do not use drugs. If you are sexually active, practice safe sex. Use a condom or other form of protection to prevent STIs. Take aspirin  only as told by your health care provider. Make sure that you understand how much to take and what form to take. Work with your health care provider to find out whether it is safe and beneficial for you to take aspirin  daily. Ask your health care provider if you need to take a cholesterol-lowering medicine (statin). Find healthy ways to manage stress, such as: Meditation, yoga, or listening to  music. Journaling. Talking to a trusted person. Spending time with friends and family. Safety Always wear your seat belt while driving or riding in a vehicle. Do not drive: If you have been drinking alcohol. Do not ride with someone who has been drinking. When you are tired or distracted. While texting. If you have been using any mind-altering substances or drugs. Wear a helmet and other protective equipment during sports activities. If  you have firearms in your house, make sure you follow all gun safety procedures. Minimize exposure to UV radiation to reduce your risk of skin cancer. What's next? Visit your health care provider once a year for an annual wellness visit. Ask your health care provider how often you should have your eyes and teeth checked. Stay up to date on all vaccines. This information is not intended to replace advice given to you by your health care provider. Make sure you discuss any questions you have with your health care provider. Document Revised: 01/25/2021 Document Reviewed: 01/25/2021 Elsevier Patient Education  2024 Elsevier Inc.       Signed,   Reyes Pines, MD [facility], [facility] 02/26/24 8:46 AM

## 2024-02-27 LAB — RPR: RPR Ser Ql: NONREACTIVE

## 2024-02-27 LAB — HEPATITIS B SURFACE ANTIBODY, QUANTITATIVE: Hep B S AB Quant (Post): 580 m[IU]/mL (ref >=10)

## 2024-02-27 LAB — HIV ANTIBODY (ROUTINE TESTING W REFLEX): HIV 1&2 Ab, 4th Generation: NONREACTIVE

## 2024-02-28 ENCOUNTER — Ambulatory Visit: Payer: Self-pay | Admitting: Family Medicine

## 2024-03-06 ENCOUNTER — Encounter: Payer: Self-pay | Admitting: Rheumatology

## 2024-03-06 ENCOUNTER — Ambulatory Visit: Attending: Rheumatology | Admitting: Rheumatology

## 2024-03-06 VITALS — BP 117/70 | HR 58 | Resp 14 | Ht 67.0 in | Wt 182.0 lb

## 2024-03-06 DIAGNOSIS — G8929 Other chronic pain: Secondary | ICD-10-CM

## 2024-03-06 DIAGNOSIS — G629 Polyneuropathy, unspecified: Secondary | ICD-10-CM | POA: Diagnosis not present

## 2024-03-06 DIAGNOSIS — M25562 Pain in left knee: Secondary | ICD-10-CM | POA: Diagnosis not present

## 2024-03-06 DIAGNOSIS — N182 Chronic kidney disease, stage 2 (mild): Secondary | ICD-10-CM | POA: Diagnosis not present

## 2024-03-06 DIAGNOSIS — Z8679 Personal history of other diseases of the circulatory system: Secondary | ICD-10-CM

## 2024-03-06 DIAGNOSIS — M1A09X0 Idiopathic chronic gout, multiple sites, without tophus (tophi): Secondary | ICD-10-CM | POA: Diagnosis not present

## 2024-03-06 DIAGNOSIS — I25810 Atherosclerosis of coronary artery bypass graft(s) without angina pectoris: Secondary | ICD-10-CM

## 2024-03-06 DIAGNOSIS — M51369 Other intervertebral disc degeneration, lumbar region without mention of lumbar back pain or lower extremity pain: Secondary | ICD-10-CM

## 2024-03-06 DIAGNOSIS — M19071 Primary osteoarthritis, right ankle and foot: Secondary | ICD-10-CM | POA: Diagnosis not present

## 2024-03-06 DIAGNOSIS — Z5181 Encounter for therapeutic drug level monitoring: Secondary | ICD-10-CM

## 2024-03-06 DIAGNOSIS — M19072 Primary osteoarthritis, left ankle and foot: Secondary | ICD-10-CM

## 2024-03-06 DIAGNOSIS — M19041 Primary osteoarthritis, right hand: Secondary | ICD-10-CM | POA: Diagnosis not present

## 2024-03-06 DIAGNOSIS — Z8639 Personal history of other endocrine, nutritional and metabolic disease: Secondary | ICD-10-CM

## 2024-03-06 DIAGNOSIS — I5032 Chronic diastolic (congestive) heart failure: Secondary | ICD-10-CM

## 2024-03-06 DIAGNOSIS — G4709 Other insomnia: Secondary | ICD-10-CM

## 2024-03-06 DIAGNOSIS — M19042 Primary osteoarthritis, left hand: Secondary | ICD-10-CM

## 2024-03-06 DIAGNOSIS — M25551 Pain in right hip: Secondary | ICD-10-CM

## 2024-03-06 NOTE — Patient Instructions (Signed)
 Standing Labs We placed an order today for your standing lab work.   Please have your standing labs drawn in October  Please have your labs drawn 2 weeks prior to your appointment so that the provider can discuss your lab results at your appointment, if possible.  Please note that you may see your imaging and lab results in MyChart before we have reviewed them. We will contact you once all results are reviewed. Please allow our office up to 72 hours to thoroughly review all of the results before contacting the office for clarification of your results.  WALK-IN LAB HOURS  Monday through Thursday from 8:00 am -12:30 pm and 1:00 pm-4:30 pm and Friday from 8:00 am-12:00 pm.  Patients with office visits requiring labs will be seen before walk-in labs.  You may encounter longer than normal wait times. Please allow additional time. Wait times may be shorter on  Monday and Thursday afternoons.  We do not book appointments for walk-in labs. We appreciate your patience and understanding with our staff.   Labs are drawn by [facility]. Please bring your co-pay at the time of your lab draw.  You may receive a bill from [facility] for your lab work.  Please note if you are on Hydroxychloroquine  and and an order has been placed for a Hydroxychloroquine  level,  you will need to have it drawn 4 hours or more after your last dose.  If you wish to have your labs drawn at another location, please call the office 24 hours in advance so we can fax the orders.  The office is located at [address], [address], [place], [place] [address]   If you have any questions regarding directions or hours of operation,  please call [phone].   As a reminder, please drink plenty of water  prior to coming for your lab work. Thanks!

## 2024-03-11 ENCOUNTER — Other Ambulatory Visit: Payer: Self-pay | Admitting: Rheumatology

## 2024-03-11 NOTE — Telephone Encounter (Signed)
 Last Fill: 12/12/2023  Labs: 02/26/2024  Glucose 100 GFR 54.92 12/26/2023 CBC WBC 14.0 Platelets 146  Next Visit: 09/10/2024  Last Visit: 03/06/2024  DX: Idiopathic chronic gout of multiple sites without tophus   Current Dose per office note 03/06/2024: Allopurinol  300 mg by mouth every other day   Okay to refill Allopurinol ?

## 2024-03-19 ENCOUNTER — Other Ambulatory Visit: Payer: Self-pay | Admitting: Internal Medicine

## 2024-04-30 ENCOUNTER — Encounter: Payer: Self-pay | Admitting: Cardiovascular Disease

## 2024-04-30 DIAGNOSIS — E7849 Other hyperlipidemia: Secondary | ICD-10-CM

## 2024-05-11 NOTE — Telephone Encounter (Signed)
 Appointment with pharmacist has been scheduled for 9/30

## 2024-05-12 ENCOUNTER — Other Ambulatory Visit: Payer: Self-pay | Admitting: Pharmacist Clinician (PhC)/ Clinical Pharmacy Specialist

## 2024-05-12 ENCOUNTER — Ambulatory Visit: Attending: Cardiology | Admitting: Pharmacist Clinician (PhC)/ Clinical Pharmacy Specialist

## 2024-05-12 ENCOUNTER — Encounter: Payer: Self-pay | Admitting: Pharmacist Clinician (PhC)/ Clinical Pharmacy Specialist

## 2024-05-12 DIAGNOSIS — E7849 Other hyperlipidemia: Secondary | ICD-10-CM

## 2024-05-12 NOTE — Progress Notes (Signed)
 Office Visit    Patient Name: Jeremy Hendricks Date of Encounter: 05/12/2024  Primary Care Provider:  Levora Reyes SAUNDERS, MD Primary Cardiologist:  Lonni Cash, MD  Chief Complaint    Hyperlipidemia   Significant Past Medical History   CAD 2002 CABG x 3; 2012 redo CABG, patent bypass grafts in 5/25  CHF Chronic diastolic - grade 2, LVEF 55-60%, on furosemide  qod  HTN Controlled on metoprolol  succ,   CKD 5/25 GFR 57     No Known Allergies  History of Present Illness    Jeremy Hendricks is a 68 y.o. male patient of Dr Cash, in the office today to discuss options for cholesterol management.  Jeremy Hendricks has been on rosuvastatin  40 mg for some time, but recently began to wonder if his muscle aches were more from medication than his regular gym workouts.   Currently still taking the rosuvastatin   Insurance Carrier: [employer] plan  Healthwell:    no  LDL Cholesterol goal:  LDL < 55  Current Medications: rosuvastatin  40 mg   Social Hx: Tobacco: no Alcohol: 1 oz rum several nights per week    Diet:   eats mostly home cooked meals, works to avoid excess fats/processed foods.  Some vegetables.     Exercise: gym 4 days per week, mix of cardio and weights   Accessory Clinical Findings   Lab Results  Component Value Date   CHOL 89 02/26/2024   HDL 25.70 (L) 02/26/2024   LDLCALC 42 02/26/2024   TRIG 108.0 02/26/2024   CHOLHDL 3 02/26/2024    No results found for: LIPOA  Lab Results  Component Value Date   ALT 13 02/26/2024   AST 18 02/26/2024   ALKPHOS 60 02/26/2024   BILITOT 0.6 02/26/2024   Lab Results  Component Value Date   CREATININE 1.34 02/26/2024   BUN 19 02/26/2024   NA 138 02/26/2024   K 4.1 02/26/2024   CL 102 02/26/2024   CO2 25 02/26/2024   Lab Results  Component Value Date   HGBA1C 5.6 11/01/2021    Home Medications    Current Outpatient Medications  Medication Sig Dispense Refill   albuterol  (VENTOLIN  HFA)  108 (90 Base) MCG/ACT inhaler Inhale 1-2 puffs into the lungs every 6 (six) hours as needed. 8 g 0   allopurinol  (ZYLOPRIM ) 300 MG tablet TAKE 1 TABLET BY MOUTH EVERY DAY 90 tablet 0   aspirin  EC 81 MG tablet Take 81 mg by mouth daily. Swallow whole.     cyclobenzaprine  (FLEXERIL ) 10 MG tablet Take 0.5-1 tablets (5-10 mg total) by mouth 3 (three) times daily as needed for muscle spasms. 30 tablet 0   diazepam (VALIUM) 5 MG tablet Take 5 mg by mouth every 12 (twelve) hours as needed for anxiety.     doxycycline  (VIBRA -TABS) 100 MG tablet Take 1 tablet (100 mg total) by mouth 2 (two) times daily. (Patient not taking: Reported on 03/06/2024) 20 tablet 0   emtricitabine -tenofovir  (TRUVADA ) 200-300 MG tablet TAKE 1 TABLET BY MOUTH EVERY DAY 90 tablet 1   furosemide  (LASIX ) 20 MG tablet Take 1 tablet (20 mg total) by mouth every other day. 45 tablet 3   HYDROcodone  bit-homatropine (HYCODAN) 5-1.5 MG/5ML syrup Take 5 mLs by mouth at bedtime as needed for cough. 120 mL 0   isosorbide  mononitrate (IMDUR ) 30 MG 24 hr tablet Take 1 tablet (30 mg total) by mouth daily. 90 tablet 3   metoprolol  succinate (TOPROL  XL) 25  MG 24 hr tablet Take 1 tablet (25 mg total) by mouth at bedtime. 90 tablet 3   MITIGARE  0.6 MG CAPS TAKE 1 CAPSULE BY MOUTH DAILY AS NEEDED FOR GOUT FLARES 90 capsule 0   Multiple Vitamin (MULTIVITAMIN) tablet Take 1 tablet by mouth daily.     mupirocin  ointment (BACTROBAN ) 2 % Apply 1 Application topically 2 (two) times daily. For 1 week 22 g 0   nitroGLYCERIN  (NITROSTAT ) 0.4 MG SL tablet Place 1 tablet (0.4 mg total) under the tongue every 5 (five) minutes as needed for chest pain. 25 tablet 6   pantoprazole  (PROTONIX ) 20 MG tablet TAKE 1 TABLET BY MOUTH EVERY DAY 90 tablet 3   rosuvastatin  (CRESTOR ) 40 MG tablet TAKE 1 TABLET BY MOUTH EVERY DAY 90 tablet 3   sertraline  (ZOLOFT ) 25 MG tablet TAKE 1 TABLET (25 MG TOTAL) BY MOUTH DAILY. 90 tablet 1   Testosterone  30 MG/ACT SOLN APPLY 1 PUMP EVERY  DAY 90 mL 1   valACYclovir  (VALTREX ) 500 MG tablet Take 1 tablet (500 mg total) by mouth 2 (two) times daily. 14 tablet 3   No current facility-administered medications for this visit.     Assessment & Plan    Hyperlipidemia: Assessment: Patient with ASCVD currently at LDL goal of < 55 Most recent LDL 42 on 02/26/24 Has been compliant with high intensity statin : rosuvastatin  40 mg daily Has noted myalgias seem to be worsening over time, unsure if related to aging or medication Reviewed options for lowering LDL cholesterol, including PCSK-9 inhibitors and inclisiran.  Discussed mechanisms of action, dosing, side effects, potential decreases in LDL cholesterol and costs.  Also reviewed potential options for patient assistance.  Plan: Stop rosuvastatin  Repeat lipid labs in 6 weeks, also determine if myalgias less problematic once off statin If no change in myalgias, will resume rosuvastatin  40 mg daily If feeling better, will start process to get Repatha 140 mg covered on plan.    Kimberely Mccannon, PharmD CPP [facility] [address]   [place], [place] [address] [phone]  05/12/2024, 1:18 PM

## 2024-05-12 NOTE — Assessment & Plan Note (Signed)
 Assessment: Patient with ASCVD currently at LDL goal of < 55 Most recent LDL 42 on 02/26/24 Has been compliant with high intensity statin : rosuvastatin  40 mg daily Has noted myalgias seem to be worsening over time, unsure if related to aging or medication Reviewed options for lowering LDL cholesterol, including PCSK-9 inhibitors and inclisiran.  Discussed mechanisms of action, dosing, side effects, potential decreases in LDL cholesterol and costs.  Also reviewed potential options for patient assistance.  Plan: Stop rosuvastatin  Repeat lipid labs in 6 weeks, also determine if myalgias less problematic once off statin If no change in myalgias, will resume rosuvastatin  40 mg daily If feeling better, will start process to get Repatha 140 mg covered on plan.

## 2024-05-12 NOTE — Patient Instructions (Signed)
 Your Results:             Your most recent labs Goal  Total Cholesterol 89 < 200  Triglycerides 108 < 150  HDL (happy/good cholesterol) 25.7 > 40  LDL (lousy/bad cholesterol 42 < 55   Medication changes:  Stop rosuvastatin .  In about 6 weeks I will reach out to you to repeat cholesterol labs.  At that point we can get approval from the insurance company to start Repatha injections every 14 days   Thank you for choosing [facility]

## 2024-05-18 DIAGNOSIS — N1831 Chronic kidney disease, stage 3a: Secondary | ICD-10-CM | POA: Diagnosis not present

## 2024-05-19 NOTE — Telephone Encounter (Signed)
 I called patient, labs drawn at [facility], patient will call [facility] to see if uric acid can be added on.

## 2024-05-20 DIAGNOSIS — E7849 Other hyperlipidemia: Secondary | ICD-10-CM | POA: Diagnosis not present

## 2024-05-21 LAB — LIPID PANEL
Chol/HDL Ratio: 4 ratio (ref 0.0–5.0)
Cholesterol, Total: 97 mg/dL — ABNORMAL LOW (ref 100–199)
HDL: 24 mg/dL — ABNORMAL LOW (ref >39)
LDL Chol Calc (NIH): 50 mg/dL (ref 0–99)
Triglycerides: 124 mg/dL (ref 0–149)
VLDL Cholesterol Cal: 23 mg/dL (ref 5–40)

## 2024-05-28 ENCOUNTER — Ambulatory Visit: Payer: Self-pay | Admitting: Pharmacist Clinician (PhC)/ Clinical Pharmacy Specialist

## 2024-05-28 DIAGNOSIS — E785 Hyperlipidemia, unspecified: Secondary | ICD-10-CM | POA: Diagnosis not present

## 2024-05-28 DIAGNOSIS — M1000 Idiopathic gout, unspecified site: Secondary | ICD-10-CM | POA: Diagnosis not present

## 2024-05-28 DIAGNOSIS — I130 Hypertensive heart and chronic kidney disease with heart failure and stage 1 through stage 4 chronic kidney disease, or unspecified chronic kidney disease: Secondary | ICD-10-CM | POA: Diagnosis not present

## 2024-05-28 DIAGNOSIS — I5032 Chronic diastolic (congestive) heart failure: Secondary | ICD-10-CM | POA: Diagnosis not present

## 2024-06-02 ENCOUNTER — Other Ambulatory Visit (HOSPITAL_BASED_OUTPATIENT_CLINIC_OR_DEPARTMENT_OTHER): Payer: Self-pay | Admitting: Pharmacist Clinician (PhC)/ Clinical Pharmacy Specialist

## 2024-06-02 DIAGNOSIS — E7849 Other hyperlipidemia: Secondary | ICD-10-CM

## 2024-06-03 ENCOUNTER — Telehealth: Admitting: Physician Assistant

## 2024-06-03 DIAGNOSIS — L089 Local infection of the skin and subcutaneous tissue, unspecified: Secondary | ICD-10-CM

## 2024-06-03 DIAGNOSIS — B9689 Other specified bacterial agents as the cause of diseases classified elsewhere: Secondary | ICD-10-CM | POA: Diagnosis not present

## 2024-06-03 DIAGNOSIS — L309 Dermatitis, unspecified: Secondary | ICD-10-CM | POA: Diagnosis not present

## 2024-06-03 MED ORDER — TRIAMCINOLONE ACETONIDE 0.1 % EX CREA: 1.0000 | TOPICAL_CREAM | Freq: Two times a day (BID) | CUTANEOUS | 0 refills | Status: DC

## 2024-06-03 MED ORDER — CEPHALEXIN 500 MG PO CAPS: 500.0000 mg | ORAL_CAPSULE | Freq: Two times a day (BID) | ORAL | 0 refills | Status: AC

## 2024-06-03 NOTE — Patient Instructions (Signed)
 Jeremy Hendricks, thank you for joining Elsie Velma Lunger, PA-C for today's virtual visit.  While this provider is not your primary care provider (PCP), if your PCP is located in our provider database this encounter information will be shared with them immediately following your visit.   A [facility] MyChart account gives you access to today's visit and all your visits, tests, and labs performed at [facility]  click here if you don't have a [facility] MyChart account or go to [email or web address]  Consent: (Patient) Jeremy Hendricks provided verbal consent for this virtual visit at the beginning of the encounter.  Current Medications:  Current Outpatient Medications:    albuterol  (VENTOLIN  HFA) 108 (90 Base) MCG/ACT inhaler, Inhale 1-2 puffs into the lungs every 6 (six) hours as needed., Disp: 8 g, Rfl: 0   allopurinol  (ZYLOPRIM ) 300 MG tablet, TAKE 1 TABLET BY MOUTH EVERY DAY, Disp: 90 tablet, Rfl: 0   aspirin  EC 81 MG tablet, Take 81 mg by mouth daily. Swallow whole., Disp: , Rfl:    cyclobenzaprine  (FLEXERIL ) 10 MG tablet, Take 0.5-1 tablets (5-10 mg total) by mouth 3 (three) times daily as needed for muscle spasms., Disp: 30 tablet, Rfl: 0   diazepam (VALIUM) 5 MG tablet, Take 5 mg by mouth every 12 (twelve) hours as needed for anxiety., Disp: , Rfl:    doxycycline  (VIBRA -TABS) 100 MG tablet, Take 1 tablet (100 mg total) by mouth 2 (two) times daily. (Patient not taking: Reported on 03/06/2024), Disp: 20 tablet, Rfl: 0   emtricitabine -tenofovir  (TRUVADA ) 200-300 MG tablet, TAKE 1 TABLET BY MOUTH EVERY DAY, Disp: 90 tablet, Rfl: 1   furosemide  (LASIX ) 20 MG tablet, Take 1 tablet (20 mg total) by mouth every other day., Disp: 45 tablet, Rfl: 3   HYDROcodone  bit-homatropine (HYCODAN) 5-1.5 MG/5ML syrup, Take 5 mLs by mouth at bedtime as needed for cough., Disp: 120 mL, Rfl: 0   isosorbide  mononitrate (IMDUR ) 30 MG 24 hr tablet, Take 1 tablet (30 mg total) by mouth daily.,  Disp: 90 tablet, Rfl: 3   metoprolol  succinate (TOPROL  XL) 25 MG 24 hr tablet, Take 1 tablet (25 mg total) by mouth at bedtime., Disp: 90 tablet, Rfl: 3   MITIGARE  0.6 MG CAPS, TAKE 1 CAPSULE BY MOUTH DAILY AS NEEDED FOR GOUT FLARES, Disp: 90 capsule, Rfl: 0   Multiple Vitamin (MULTIVITAMIN) tablet, Take 1 tablet by mouth daily., Disp: , Rfl:    mupirocin  ointment (BACTROBAN ) 2 %, Apply 1 Application topically 2 (two) times daily. For 1 week, Disp: 22 g, Rfl: 0   nitroGLYCERIN  (NITROSTAT ) 0.4 MG SL tablet, Place 1 tablet (0.4 mg total) under the tongue every 5 (five) minutes as needed for chest pain., Disp: 25 tablet, Rfl: 6   pantoprazole  (PROTONIX ) 20 MG tablet, TAKE 1 TABLET BY MOUTH EVERY DAY, Disp: 90 tablet, Rfl: 3   rosuvastatin  (CRESTOR ) 40 MG tablet, TAKE 1 TABLET BY MOUTH EVERY DAY, Disp: 90 tablet, Rfl: 3   sertraline  (ZOLOFT ) 25 MG tablet, TAKE 1 TABLET (25 MG TOTAL) BY MOUTH DAILY., Disp: 90 tablet, Rfl: 1   Testosterone  30 MG/ACT SOLN, APPLY 1 PUMP EVERY DAY, Disp: 90 mL, Rfl: 1   valACYclovir  (VALTREX ) 500 MG tablet, Take 1 tablet (500 mg total) by mouth 2 (two) times daily., Disp: 14 tablet, Rfl: 3   Medications ordered in this encounter:  No orders of the defined types were placed in this encounter.    *If you need refills on other medications prior  to your next appointment, please contact your pharmacy*  Follow-Up: Call back or seek an in-person evaluation if the symptoms worsen or if the condition fails to improve as anticipated.  [facility] [phone]  Other Instructions Please keep skin clean and dry. Apply triamcinolone  as directed in thin layer to the two areas. Ok to use Mupirocin  twice daily to the area on the R hand. Any increasing redness, tenderness or swelling -- start the Keflex , taking as directed. If you note any non-resolving, new, or worsening symptoms despite treatment, please seek an in-person evaluation ASAP.    If you have been  instructed to have an in-person evaluation today at a local Urgent Care facility, please use the link below. It will take you to a list of all of our available [facility], including address, phone number and hours of operation. Please do not delay care.  [facility]  If you or a family member do not have a primary care provider, use the link below to schedule a visit and establish care. When you choose a [facility] primary care physician or advanced practice provider, you gain a long-term partner in health. Find a Primary Care Provider  Learn more about [facility] in-office and virtual care options: [facility] - Get Care Now

## 2024-06-03 NOTE — Progress Notes (Signed)
 Virtual Visit Consent   SENECA HOBACK, you are scheduled for a virtual visit with a [facility] provider today. Just as with appointments in the office, your consent must be obtained to participate. Your consent will be active for this visit and any virtual visit you may have with one of our providers in the next 365 days. If you have a MyChart account, a copy of this consent can be sent to you electronically.  As this is a virtual visit, video technology does not allow for your provider to perform a traditional examination. This may limit your provider's ability to fully assess your condition. If your provider identifies any concerns that need to be evaluated in person or the need to arrange testing (such as labs, EKG, etc.), we will make arrangements to do so. Although advances in technology are sophisticated, we cannot ensure that it will always work on either your end or our end. If the connection with a video visit is poor, the visit may have to be switched to a telephone visit. With either a video or telephone visit, we are not always able to ensure that we have a secure connection.  By engaging in this virtual visit, you consent to the provision of healthcare and authorize for your insurance to be billed (if applicable) for the services provided during this visit. Depending on your insurance coverage, you may receive a charge related to this service.  I need to obtain your verbal consent now. Are you willing to proceed with your visit today? BRUNO LEACH has provided verbal consent on 06/03/2024 for a virtual visit (video or telephone). Elsie Velma Lunger, [place]  Date: 06/03/2024 4:54 PM   Virtual Visit via Video Note   I, Elsie Velma Lunger, connected with  ZAKARIYE NEE  ([id number], [birth year]) on 06/03/24 at  4:45 PM EDT by a video-enabled telemedicine application and verified that I am speaking with the correct person using two identifiers.  Location: Patient: Virtual Visit  Location Patient: Home Provider: Virtual Visit Location Provider: Home Office   I discussed the limitations of evaluation and management by telemedicine and the availability of in person appointments. The patient expressed understanding and agreed to proceed.    History of Present Illness: CRISTIN SZATKOWSKI is a 68 y.o. who identifies as a male who was assigned male at birth, and is being seen today for concern of two lesions of hand. Notes first seeing 3 days ago of L hand, between thumb and infex finger. Notes small bumps that are sometimes itchy but not painful. Otherwise just there. Happened to look at his R hand and noting a similar area. Notes the lesion of R hand has gotten red and painful since onset. Denies drainage. Denies any trauma or injury. Notes he was around a friend with MRSA and has also been helping another friend with some cleaning and having to use wet rag a lot. Denies fever, chills, malaise or fatigue.   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   HLD (hyperlipidemia) 06/11/2016   Hypogonadism in male 09/09/2015   CAD, ARTERY BYPASS GRAFT 10/26/2009   HYPERLIPIDEMIA 10/24/2009   Osteoarthritis 10/24/2009   History of cardiovascular disorder 10/24/2009    Allergies: No Known Allergies Medications:  Current Outpatient Medications:    cephALEXin  (KEFLEX ) 500 MG capsule, Take 1 capsule (500 mg total) by mouth 2 (two) times daily for 7 days., Disp: 14 capsule, Rfl: 0   triamcinolone  cream (KENALOG ) 0.1 %,  Apply 1 Application topically 2 (two) times daily., Disp: 30 g, Rfl: 0   albuterol  (VENTOLIN  HFA) 108 (90 Base) MCG/ACT inhaler, Inhale 1-2 puffs into the lungs every 6 (six) hours as needed., Disp: 8 g, Rfl: 0   allopurinol  (ZYLOPRIM ) 300 MG tablet, TAKE 1 TABLET BY MOUTH EVERY DAY, Disp: 90 tablet, Rfl: 0   aspirin  EC 81 MG tablet, Take 81 mg by mouth daily. Swallow whole., Disp: , Rfl:    cyclobenzaprine  (FLEXERIL ) 10 MG tablet, Take 0.5-1 tablets (5-10 mg total) by mouth 3 (three) times daily as needed for muscle spasms., Disp: 30 tablet, Rfl: 0   diazepam (VALIUM) 5 MG tablet, Take 5 mg by mouth every 12 (twelve) hours as needed for anxiety., Disp: , Rfl:    doxycycline  (VIBRA -TABS) 100 MG tablet, Take 1 tablet (100 mg total) by mouth 2 (two) times daily. (Patient not taking: Reported on 03/06/2024), Disp: 20 tablet, Rfl: 0   emtricitabine -tenofovir  (TRUVADA ) 200-300 MG tablet, TAKE 1 TABLET BY MOUTH EVERY DAY, Disp: 90 tablet, Rfl: 1   furosemide  (LASIX ) 20 MG tablet, Take 1 tablet (20 mg total) by mouth every other day., Disp: 45 tablet, Rfl: 3   HYDROcodone  bit-homatropine (HYCODAN) 5-1.5 MG/5ML syrup, Take 5 mLs by mouth at bedtime as needed for cough., Disp: 120 mL, Rfl: 0   isosorbide  mononitrate (IMDUR ) 30 MG 24 hr tablet, Take 1 tablet (30 mg total) by mouth daily., Disp: 90 tablet, Rfl: 3   metoprolol  succinate (TOPROL  XL) 25 MG 24 hr tablet, Take 1 tablet (25 mg total) by mouth at bedtime., Disp: 90 tablet, Rfl: 3   MITIGARE  0.6 MG CAPS, TAKE 1 CAPSULE BY MOUTH DAILY AS NEEDED FOR GOUT FLARES, Disp: 90 capsule, Rfl: 0   Multiple Vitamin (MULTIVITAMIN) tablet, Take 1 tablet by mouth daily., Disp: , Rfl:    nitroGLYCERIN  (NITROSTAT ) 0.4 MG SL tablet, Place 1 tablet (0.4 mg total) under the tongue every 5 (five) minutes as needed for chest pain., Disp: 25 tablet, Rfl: 6   pantoprazole  (PROTONIX ) 20 MG tablet, TAKE 1 TABLET BY MOUTH EVERY DAY, Disp: 90 tablet, Rfl: 3   rosuvastatin   (CRESTOR ) 40 MG tablet, TAKE 1 TABLET BY MOUTH EVERY DAY, Disp: 90 tablet, Rfl: 3   sertraline  (ZOLOFT ) 25 MG tablet, TAKE 1 TABLET (25 MG TOTAL) BY MOUTH DAILY., Disp: 90 tablet, Rfl: 1   Testosterone  30 MG/ACT SOLN, APPLY 1 PUMP EVERY DAY, Disp: 90 mL, Rfl: 1   valACYclovir  (VALTREX ) 500 MG tablet, Take 1 tablet (500 mg total) by mouth 2 (two) times daily., Disp: 14 tablet, Rfl: 3  Observations/Objective: Patient is well-developed, well-nourished in no acute distress.  Resting comfortably  at home.  Head is normocephalic, atraumatic.  No labored breathing.  Speech is clear and coherent with logical content.  Patient is alert and oriented at baseline.  In the webbing between R thumb and index finger there is a noted small area of focal swelling and erythema without active drainage.  Some vesicular lesions noted adjacent to area of swelling. Similar presentation in webbing of same spot on the R hand, but without erythema or swelling. Some eczematous patches noted in these areas as well.  Assessment and Plan: 1. Acute eczema of hand (Primary) - triamcinolone  cream (KENALOG ) 0.1 %; Apply 1 Application topically 2 (two) times daily.  Dispense: 30 g; Refill: 0  2. Bacterial skin infection - cephALEXin  (KEFLEX ) 500 MG capsule; Take 1 capsule (500 mg total) by mouth 2 (two) times daily for 7 days.  Dispense: 14 capsule; Refill: 0  Suspect mild dyshidrosis from recent excessive use of damp cloth and friction when wringing out the cleaning cloth. Now with some concern for mild secondary infection of area on R hand. Supportive measures and skin care reviewed. Start triamcinolone  per orders. Has some in-date mupirocin  at home to start applying as directed to R hand. If this area continues to spread -- increased redness, warmth, tenderness -- he is to take Keflex  as directed.  Follow Up Instructions: I discussed the assessment and treatment plan with the patient. The patient was provided an opportunity  to ask questions and all were answered. The patient agreed with the plan and demonstrated an understanding of the instructions.  A copy of instructions were sent to the patient via MyChart unless otherwise noted below.   The patient was advised to call back or seek an in-person evaluation if the symptoms worsen or if the condition fails to improve as anticipated.    Elsie Velma Lunger, PA-C

## 2024-06-06 ENCOUNTER — Other Ambulatory Visit: Payer: Self-pay | Admitting: Rheumatology

## 2024-06-08 NOTE — Telephone Encounter (Signed)
 Last Fill: 03/11/2024  Labs: 02/26/2024 CMP: glucose 100, GFR 54.92 12/26/2023 CBC: WBC 15.0, platelets 146 Uric acid: 07/30/2023 7.5  Next Visit: 09/10/2024  Last Visit: 03/06/2024  DX: Idiopathic chronic gout of multiple sites without tophus   Current Dose per office note on 03/06/2024: Allopurinol  300 mg by mouth every other day.   Okay to refill Allopurinol ?

## 2024-06-11 ENCOUNTER — Ambulatory Visit: Payer: Self-pay | Admitting: Pharmacist Clinician (PhC)/ Clinical Pharmacy Specialist

## 2024-06-11 LAB — LIPID PANEL
Chol/HDL Ratio: 4.7 ratio (ref 0.0–5.0)
Cholesterol, Total: 127 mg/dL (ref 100–199)
HDL: 27 mg/dL — ABNORMAL LOW (ref >39)
LDL Chol Calc (NIH): 80 mg/dL (ref 0–99)
Triglycerides: 105 mg/dL (ref 0–149)
VLDL Cholesterol Cal: 20 mg/dL (ref 5–40)

## 2024-06-12 NOTE — Telephone Encounter (Signed)
 Please do PA for Repatha

## 2024-06-15 ENCOUNTER — Other Ambulatory Visit (HOSPITAL_COMMUNITY): Payer: Self-pay

## 2024-06-15 NOTE — Telephone Encounter (Signed)
 Pharmacy Patient Advocate Encounter   Received notification from Physician's Office that prior authorization for REPATHA is required/requested.   Insurance verification completed.   The patient is insured through [employer].   Per test claim: PA required; PA submitted to above mentioned insurance via Latent Key/confirmation #/EOC [id number] Status is pending

## 2024-06-15 NOTE — Telephone Encounter (Signed)
 Pharmacy Patient Advocate Encounter  Received notification from [employer] that Prior Authorization for REPATHA has been APPROVED from 06/15/24 to 08/12/25. Ran test claim, Copay is $40. This test claim was processed through [facility]- copay amounts may vary at other pharmacies due to pharmacy/plan contracts, or as the patient moves through the different stages of their insurance plan.

## 2024-06-16 ENCOUNTER — Encounter: Payer: Self-pay | Admitting: Pharmacist Clinician (PhC)/ Clinical Pharmacy Specialist

## 2024-06-16 DIAGNOSIS — E7849 Other hyperlipidemia: Secondary | ICD-10-CM

## 2024-06-16 MED ORDER — REPATHA SURECLICK 140 MG/ML ~~LOC~~ SOAJ: 140.0000 mg | SUBCUTANEOUS | 3 refills | Status: AC

## 2024-06-16 NOTE — Addendum Note (Signed)
 Addended by: Kinzee Happel L on: 06/16/2024 12:48 PM   Modules accepted: Orders

## 2024-07-15 ENCOUNTER — Ambulatory Visit: Payer: Self-pay

## 2024-07-15 NOTE — Telephone Encounter (Signed)
 FYI Only or Action Required?: FYI only for provider: appointment scheduled on 12/4.  Patient was last seen in primary care on 06/03/2024 by Gladis Elsie BROCKS, PA-C.  Called Nurse Triage reporting Rash.  Symptoms began x 2 weeks ago.  Interventions attempted: OTC medications: antibiotic cream.  Symptoms are: unchanged.  Triage Disposition: See Physician Within 24 Hours  Patient/caregiver understands and will follow disposition?: Yes         Copied from CRM #[id number]. Topic: Clinical - Red Word Triage >> Jul 15, 2024 12:03 PM Shereese L wrote: [facility] that prompted transfer to Nurse Triage: Allergic reaction and bumps/rashes all over body and possible Hemorid Reason for Disposition  SEVERE itching (i.e., interferes with sleep, normal activities or school)  Answer Assessment - Initial Assessment Questions 1. APPEARANCE of RASH: What does the rash look like? (e.g., blisters, dry flaky skin, red spots, redness, sores)     Red, tiny bumps on chest , cannot see anal area bumps  2. SIZE: How big are the spots? (e.g., tip of pen, eraser, coin; inches, centimeters)     Tiny on chest, and anal area   3. LOCATION: Where is the rash located?     Chest, anal area  4. COLOR: What color is the rash? (Note: It is difficult to assess rash color in people with darker-colored skin. When this situation occurs, simply ask the caller to describe what they see.)     Red  5. ONSET: When did the rash begin?     X 2 weeks   6. FEVER: Do you have a fever? If Yes, ask: What is your temperature, how was it measured, and when did it start?     No   7. ITCHING: Does the rash itch? If Yes, ask: How bad is the itch? (Scale 1-10; or mild, moderate, severe)      On chest, itching noted-mild/moderate, anal area noted as well-moderate/severe  8. CAUSE: What do you think is causing the rash?     Unsure   9. MEDICINE FACTORS: Have you started any new medicines within the last 2  weeks? (e.g., antibiotics)      No   10. OTHER SYMPTOMS: Do you have any other symptoms? (e.g., dizziness, headache, sore throat, joint pain)      No   Patient called in with complaints of a rash/bumps on chest area and near his anus. He also mentioned possible hemroids. He reports at night the rash worsens. He has tried antibiotic cream, symptoms persist. Appointment scheduled for evaluation. Patient agrees with plan of care, and will call back if anything changes, or if symptoms worsen.  Protocols used: Rash or Redness - [facility]

## 2024-07-16 ENCOUNTER — Ambulatory Visit: Admitting: Family Medicine

## 2024-07-16 VITALS — BP 140/68 | HR 65 | Temp 97.9°F | Resp 16 | Ht 67.0 in | Wt 176.2 lb

## 2024-07-16 DIAGNOSIS — B356 Tinea cruris: Secondary | ICD-10-CM | POA: Diagnosis not present

## 2024-07-16 DIAGNOSIS — K649 Unspecified hemorrhoids: Secondary | ICD-10-CM | POA: Diagnosis not present

## 2024-07-16 DIAGNOSIS — R21 Rash and other nonspecific skin eruption: Secondary | ICD-10-CM | POA: Diagnosis not present

## 2024-07-16 DIAGNOSIS — N509 Disorder of male genital organs, unspecified: Secondary | ICD-10-CM | POA: Diagnosis not present

## 2024-07-16 MED ORDER — CLOTRIMAZOLE 1 % EX CREA: 1.0000 | TOPICAL_CREAM | Freq: Two times a day (BID) | CUTANEOUS | 0 refills | Status: AC

## 2024-07-16 NOTE — Patient Instructions (Signed)
 See info below on hemorrhoids.  Over-the-counter treatment is fine.  Follow-up if any persistent itching or worsening symptoms.  The rash between the buttocks and in the left groin appears to be a fungal infection or tinea cruris.  You potentially could have had a fungal infection of the chest but I do not see any rash in that area at this time.  I did prescribe a antifungal cream called clotrimazole that should be used in those areas twice per day until the symptoms resolve and then use for 1 additional week.  Okay to use the triamcinolone  steroid cream that you have for itching up to twice per day.  Follow-up if any spread of rash or worsening symptoms.  The small bump on the right scrotum could be a small hair bump or ingrown hair.  Warm compresses to that area, gentle pressure is fine if you do see it to come to ahead, but be seen if any worsening symptoms or not improving in the next few weeks.  Take care.   Jock Itch Jock itch (tinea cruris) is an infection of the skin in the groin area that is caused by a fungus. Jock itch causes an itchy rash in the groin and upper thigh area. It usually goes away in 2-3 weeks with treatment. What are the causes? The fungus that causes jock itch may be spread by: Touching a fungal infection elsewhere on your body, such as athlete's foot, and then touching your groin area. Sharing towels or clothing, such as socks or shoes, with someone who has a fungal infection. What increases the risk? Jock itch is most common in men and adolescent boys. You are also more likely to develop the condition if you: Are in a hot, humid climate. Wear tight-fitting clothing or wet bathing suits for long periods of time. Play sports. Are overweight. Have diabetes. Have a weakened body defense system (immune system). Sweat a lot. What are the signs or symptoms? Symptoms of jock itch may include: A red, pink, or brown rash in the groin area. Blisters may be present. The rash  may spread to the thighs, the opening between the buttocks (anus), and the buttocks. Dry and scaly skin on or around the rash. Itchiness. How is this diagnosed? In most cases, your health care provider can make the diagnosis by looking at your rash. In some cases, a sample of infected skin may be scraped off. This sample may be examined under a microscope (biopsy) or by trying to grow the fungus from the sample (culture). How is this treated? Treatment for this condition may include: Antifungal medicine to kill the fungus. This may be a skin cream, ointment, or powder, or it may be a medicine that you take by mouth (orally). Skin cream or ointment to reduce itching. Lifestyle changes, such as wearing looser clothing and caring for your skin. Follow these instructions at home: Skin care Apply skin creams, ointments, or powders exactly as told by your health care provider. Wear loose-fitting clothing that does not rub against your groin area. Men should wear boxer shorts or loose-fitting underwear. Keep your groin area clean and dry. Change your underwear every day. Change out of wet bathing suits as soon as possible. After bathing, use a separate towel to gently dry your groin area thoroughly. Using a separate towel will help prevent spreading the infection to other parts of your body. Avoid hot baths and showers. Hot water can make itching worse. Do not scratch the affected area. General  instructions Take and apply over-the-counter and prescription medicines only as told by your health care provider. Do not share towels, clothing, or personal items with other people. Wash your hands often with soap and water for at least 20 seconds, especially after touching your groin area. If soap and water are not available, use hand sanitizer. When at the gym: Always wear shoes, especially in the shower and around the swimming pool. Keep any cuts covered. Disinfect any mats or equipment before using  them. Shower immediately after working out. Keep all follow-up visits. This is important. Contact a health care provider if: Your rash: Gets worse or does not get better after 2 weeks of treatment. Spreads. Returns after treatment is finished. You have any of the following: A fever. New or worsening redness, swelling, or pain around your rash. Fluid, blood, or pus coming from your rash. Summary Jock itch (tinea cruris) is a fungal infection of the skin in the groin area. The fungus can be spread by sharing clothing or by touching a fungus infection elsewhere on your body and then touching your groin area. Treatment may include antifungal medicine and lifestyle changes, such as keeping the area clean and dry. This information is not intended to replace advice given to you by your health care provider. Make sure you discuss any questions you have with your health care provider. Document Revised: 10/18/2020 Document Reviewed: 10/18/2020 Else[employer] Patient Education  2024 [employer]  Hemorrhoids Hemorrhoids are swollen veins in and around the rectum or the opening of the butt (anus). There are two types of hemorrhoids: Internal. These occur in the veins just inside the rectum. They may poke through to the outside and become irritated and painful. External. These occur in the veins outside the anus. They can be felt as a painful swelling or hard lump near the anus. Most hemorrhoids do not cause severe problems. Often, they can be treated at home with diet and lifestyle changes. If home treatments do not help, you may need a procedure to shrink or remove the hemorrhoids. What are the causes? Hemorrhoids are caused by pressure near the anus. This pressure may be caused by: Constipation or diarrhea. Straining to poop. Pregnancy. Obesity. Sitting or riding a bike for a long time. Heavy lifting or other things that cause you to strain. Anal sex. What are the signs or symptoms? Symptoms of  this condition include: Pain. Anal itching or irritation. Bleeding from the rectum. Leakage of poop (stool). Swelling of the anus. One or more lumps around the anus. How is this diagnosed? Hemorrhoids can often be diagnosed through a visual exam. Other exams or tests may also be done, such as: A digital rectal exam. This is when your health care provider feels inside your rectum with a gloved finger. Anoscope. This is an exam of the anus using a small tube. A blood test, if you have lost a lot of blood. A sigmoidoscopy or colonoscopy. These are tests to look inside the colon using a tube with a camera on the end. How is this treated? In most cases, hemorrhoids can be treated at home with diet and lifestyle changes. If these changes do not help, you may need to have a procedure done. These procedures can make the hemorrhoids smaller or fully remove them. Common procedures include: Rubber band ligation. Rubber bands are placed at the base of the hemorrhoids to cut off their blood supply. Sclerotherapy. Medicine is put into the hemorrhoids to shrink them. Infrared coagulation. A type  of light energy is used to get rid of the hemorrhoids. Hemorrhoidectomy surgery. The hemorrhoids are removed during surgery. Then, the veins that supply them are tied off. Stapled hemorrhoidopexy surgery. The base of the hemorrhoid is stapled to the wall of the rectum. Follow these instructions at home: Medicines Take over-the-counter and prescription medicines only as told by your provider. Use medicated creams or medicines that are put in the rectum (suppositories) as told by your provider. Eating and drinking  Eat foods that are high in fiber, such as beans, whole grains, and fresh fruits and vegetables. Ask your provider about taking products that have fiber added to them (fiber supplements). Reduce the amount of fat in your diet. You can do this by eating low-fat dairy products, eating less red meat, and  avoiding processed foods. Drink enough fluid to keep your pee (urine) pale yellow. Managing pain and swelling  Take warm sitz baths for 20 minutes, 3-4 times a day. This can help ease pain and discomfort. You may do this in a bathtub or you can use a portable sitz bath that fits over the toilet. If told, put ice on the affected area. It may help to use ice packs between sitz baths. Put ice in a plastic bag. Place a towel between your skin and the bag. Leave the ice on for 20 minutes, 2-3 times a day. If your skin turns bright red, remove the ice right away to prevent skin damage. The risk of damage is higher if you cannot feel pain, heat, or cold. General instructions Exercise. Ask your provider how much and what kind of exercise is best for you. In general, you should do moderate exercise for at least 30 minutes on most days of the week (150 minutes each week). You may want to try walking, biking, or yoga. Go to the bathroom when you have the urge to poop. Do not wait. Avoid straining to poop. Keep the anus dry and clean. Use wet toilet paper or moist towelettes after you poop. Do not sit on the toilet for a long time. This can increase blood pooling and pain. Where to find more information [employer]: [email or web address] Contact a health care provider if: You have more pain and swelling that do not get better with treatment. You have trouble pooping or you are not able to poop. You have pain or inflammation outside the area of the hemorrhoids. Get help right away if: You are bleeding from your rectum and you cannot get it to stop. This information is not intended to replace advice given to you by your health care provider. Make sure you discuss any questions you have with your health care provider. Document Revised: 04/11/2022 Document Reviewed: 04/11/2022 Elsevier Patient Education  2024 [employer].

## 2024-07-16 NOTE — Progress Notes (Signed)
 Subjective:  Patient ID: Jeremy Hendricks, male    DOB: [birth year]  Age: 68 y.o. MRN: [id number]  CC:  Chief Complaint  Patient presents with   Rash    Between bottom, groin and chest. Sx started a couple weeks ago. Did start a new medication Repatha  (3 third shot this morning). Sx appeared after second shot. His partner has a rash on his chest as well. They started using a new tide pod.    Hemorrhoids    Sx started a couple weeks ago. No bleeding. Not pain when using the restroom. Some itching. Does a lump the size of a lima bean near the anus.    Groin Swelling    Pain externally. Pimple without a head that he can tell. Noticed last night.    HPI Jeremy Hendricks presents for  Acute visit for concerns above  Rash Started few weeks ago.  Chest, buttocks, groin.  Initially noted on chest - partner had pruritic rash first - upper chest. Initial rash - itching in front of chest. Rash did not involve fingers, hands, arms. Spread to buttocks in a few days. Itching with rough feeling to skin. No blisters known. No initial genital rash. Last night noticed a bump on R testicle. Shaves genital hair - about a week ago - bump noted last night.   No discharge.  Tx: triamcinolone  topical last night - helped itching. Prior antifungal cream few doses, Abx ointment, no relief. Minimal improvement with hemorrhoidal cream in buttock area.    New medicine of Repatha , status post 3 doses.  Noticed rash after the second injection? However his partner also has a rash on chest as well.  Had been using a new Tide pod. No oral lesions. No genital rash prior to last night. No penile discharge.   Chest symptoms have improved in past day or so.  Buttock and lower itching has also improved.   Hemorrhoids Some itching, enlarged hemorrhoid noted but no pain or bleeding.  Noted few weeks ago. Has had in past. Denies recent constipation. No new painful BM. Receptive anal intercourse, but no pain.  Tx: hemorrhoid cream  otc. Minimal change.   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 s/p CABG 2002. b. s/p redo 2012.   DJD (degenerative joint disease)    Gout    History of ETT    a. ETT 6/16:  normal   Past Surgical History:  Procedure Laterality Date   CORONARY ARTERY BYPASS GRAFT  2002   CORONARY ARTERY BYPASS  GRAFT  08-2010   L-LAD remained from original CABG; new grafts incl L radial- PDA + RIMA-RI   LEFT HEART CATH AND CORS/GRAFTS ANGIOGRAPHY N/A 12/31/2023   Procedure: LEFT HEART CATH AND CORS/GRAFTS ANGIOGRAPHY;  Surgeon: Verlin Lonni BIRCH, MD;  Location: [facility];  Service: Cardiovascular;  Laterality: N/A;   VASECTOMY     No Known Allergies Prior to Admission medications   Medication Sig Start Date End Date Taking? Authorizing Provider  albuterol  (VENTOLIN  HFA) 108 (90 Base) MCG/ACT inhaler Inhale 1-2 puffs into the lungs every 6 (six) hours as needed. 06/17/23  Yes Vivienne Delon HERO, PA-C  allopurinol  (ZYLOPRIM ) 300 MG tablet Take 1 tablet (300 mg total) by mouth every other day. 06/08/24  Yes  Deveshwar, Maya, MD  aspirin  EC 81 MG tablet Take 81 mg by mouth daily. Swallow whole.   Yes [provider]  diazepam (VALIUM) 5 MG tablet Take 5 mg by mouth every 12 (twelve) hours as needed for anxiety. 06/05/23  Yes [provider]  emtricitabine -tenofovir  (TRUVADA ) 200-300 MG tablet TAKE 1 TABLET BY MOUTH EVERY DAY 02/11/24  Yes Levora Reyes SAUNDERS, MD  Evolocumab  (REPATHA  SURECLICK) 140 MG/ML SOAJ Inject 140 mg into the skin every 14 (fourteen) days. 06/16/24  Yes Verlin Lonni BIRCH, MD  furosemide  (LASIX ) 20 MG tablet Take 1 tablet (20 mg total) by mouth every other day. 08/26/23  Yes Levora Reyes SAUNDERS, MD  isosorbide  mononitrate (IMDUR ) 30 MG 24 hr tablet Take 1 tablet (30 mg total) by mouth daily. 10/04/23  Yes Thukkani, Arun K, MD  metoprolol  succinate (TOPROL  XL) 25 MG 24 hr tablet Take 1 tablet (25 mg total) by mouth at bedtime. 10/04/23  Yes Thukkani, Arun K, MD  MITIGARE  0.6 MG CAPS TAKE 1 CAPSULE BY MOUTH DAILY AS NEEDED FOR GOUT FLARES 04/22/23  Yes Cheryl Waddell HERO, PA-C  Multiple Vitamin (MULTIVITAMIN) tablet Take 1 tablet by mouth daily.   Yes [provider]  nitroGLYCERIN  (NITROSTAT ) 0.4 MG SL tablet Place 1 tablet (0.4 mg total) under the tongue every 5 (five) minutes as needed for chest pain. 10/04/23  Yes Thukkani, Arun K, MD  pantoprazole  (PROTONIX ) 20 MG tablet TAKE 1 TABLET BY MOUTH EVERY DAY 03/19/24  Yes Thukkani, Arun K, MD  rosuvastatin  (CRESTOR ) 40 MG tablet TAKE 1 TABLET BY MOUTH EVERY DAY 12/02/23  Yes Thukkani, Arun K, MD  Testosterone  30 MG/ACT SOLN APPLY 1 PUMP EVERY DAY 02/24/24  Yes Levora Reyes SAUNDERS, MD  triamcinolone  cream (KENALOG ) 0.1 % Apply 1 Application topically 2 (two) times daily. 06/03/24  Yes Gladis Elsie BROCKS, PA-C  valACYclovir  (VALTREX ) 500 MG tablet Take 1 tablet (500 mg total) by mouth 2 (two) times daily. 08/26/23  Yes Levora Reyes SAUNDERS, MD  cyclobenzaprine  (FLEXERIL ) 10 MG tablet Take 0.5-1 tablets (5-10 mg total) by mouth 3  (three) times daily as needed for muscle spasms. Patient not taking: Reported on 07/16/2024 11/28/23   Levora Reyes SAUNDERS, MD  doxycycline  (VIBRA -TABS) 100 MG tablet Take 1 tablet (100 mg total) by mouth 2 (two) times daily. Patient not taking: Reported on 07/16/2024 11/18/23   Levora Reyes SAUNDERS, MD  HYDROcodone  bit-homatropine [facility]) 5-1.5 MG/5ML syrup Take 5 mLs by mouth at bedtime as needed for cough. Patient not taking: Reported on 07/16/2024 09/23/23   Levora Reyes SAUNDERS, MD  sertraline  (ZOLOFT ) 25 MG tablet TAKE 1 TABLET (25 MG TOTAL) BY MOUTH DAILY. Patient not taking: Reported on 07/16/2024 12/02/23   Levora Reyes SAUNDERS, MD  Social History   Socioeconomic History   Marital status: Single    Spouse name: Not on file   Number of children: 2   Years of education: Not on file   Highest education level: Professional school degree (e.g., MD, DDS, DVM, JD)  Occupational History   Occupation: Realtor  Tobacco Use   Smoking status: Former    Current packs/day: 0.00    Types: Cigarettes    Quit date: 08/14/1999    Years since quitting: 24.9    Passive exposure: Past   Smokeless tobacco: Never   Tobacco comments:    Social smoker  Vaping Use   Vaping status: Never Used  Substance and Sexual Activity   Alcohol use: Yes    Alcohol/week: 14.0 standard drinks of alcohol    Types: 14 Shots of liquor per week    Comment: 1oz-2oz daily    Drug use: No   Sexual activity: Yes  Other Topics Concern   Not on file  Social History Narrative   Single   Education: College   Exercise: Yes   Social Drivers of Health   Financial Resource Strain: Low Risk  (02/22/2024)   Overall Financial Resource Strain (CARDIA)    Difficulty of Paying Living Expenses: Not very hard  Food Insecurity: No Food Insecurity (02/22/2024)   Hunger Vital Sign    Worried About Running Out of Food in the Last Year: Never true    Ran Out of Food in the Last Year: Never true  Transportation Needs: No Transportation Needs  (02/22/2024)   PRAPARE - Administrator, Civil Service (Medical): No    Lack of Transportation (Non-Medical): No  Physical Activity: Sufficiently Active (08/22/2023)   Exercise Vital Sign    Days of Exercise per Week: 5 days    Minutes of Exercise per Session: 60 min  Stress: No Stress Concern Present (08/22/2023)   [employer] of Occupational Health - Occupational Stress Questionnaire    Feeling of Stress : Only a little  Social Connections: Unknown (02/22/2024)   Social Connection and Isolation Panel    Frequency of Communication with Friends and Family: More than three times a week    Frequency of Social Gatherings with Friends and Family: More than three times a week    Attends Religious Services: More than 4 times per year    Active Member of [employer] or Organizations: Yes    Attends Banker Meetings: More than 4 times per year    Marital Status: Patient declined  Intimate Partner Violence: Not At Risk (11/22/2022)   Humiliation, Afraid, Rape, and Kick questionnaire    Fear of Current or Ex-Partner: No    Emotionally Abused: No    Physically Abused: No    Sexually Abused: No    Review of Systems Per hPI  Objective:   Vitals:   07/16/24 1123  BP: (!) 140/68  Pulse: 65  Resp: 16  Temp: 97.9 F (36.6 C)  TempSrc: Temporal  SpO2: 97%  Weight: 176 lb 3.2 oz (79.9 kg)  Height: 5' 7 (1.702 m)     Physical Exam Chaperone present: Chaperone offered, declined..  Constitutional:      General: He is not in acute distress.    Appearance: Normal appearance. He is well-developed.  HENT:     Head: Normocephalic and atraumatic.  Cardiovascular:     Rate and Rhythm: Normal rate.  Pulmonary:     Effort: Pulmonary effort is normal.  Skin:  Findings: Rash present.     Comments: Slight scale surrounding hyperpigmented patch at the left inguinal fold.  No vesicular lesions.  Right scrotal skin with small papule, no significant induration or discharge.   No surrounding erythema.  Somewhat excoriated, raw appearing area at upper anal cleft with slight erythematous patches and a few satellite lesions with elevated border, flat central aspect.  External anal exam, nonthrombosed, nonbleeding hemorrhoid at 9:00.  Nontender.  No apparent fissures or active bleeding.  No vesicles or other lesions identified.    Neurological:     Mental Status: He is alert and oriented to person, place, and time.  Psychiatric:        Mood and Affect: Mood normal.        Assessment & Plan:  LORAS GRIESHOP is a 68 y.o. male . Tinea cruris - Plan: clotrimazole  (LOTRIMIN ) 1 % cream  - Appears to have mild form of tinea cruris, clotrimazole  cream recommended in inguinal folds as well as in the cleft.  RTC precautions.  Rash and nonspecific skin eruption - Plan: clotrimazole  (LOTRIMIN ) 1 % cream  - Few areas around the buttocks with central clearing, appear to have tinea corporis appearance, Lotrimin  cream recommended until clear and then continue for 1 additional week.  Scrotal lesion  - Small papular lesion, possible ingrown hair, without significant induration, surround erythema or fluctuance.  Continue to monitor, with warm compresses, gentle pressure if that area does come to a head or discharge, ER/RTC precautions.  Hemorrhoids, unspecified hemorrhoid type  - Nonthrombosed, nonbleeding.  Over-the-counter treatments are fine for now with RTC precautions.  Meds ordered this encounter  Medications   clotrimazole  (LOTRIMIN ) 1 % cream    Sig: Apply 1 Application topically 2 (two) times daily.    Dispense:  30 g    Refill:  0   Patient Instructions  See info below on hemorrhoids.  Over-the-counter treatment is fine.  Follow-up if any persistent itching or worsening symptoms.  The rash between the buttocks and in the left groin appears to be a fungal infection or tinea cruris.  You potentially could have had a fungal infection of the chest but I do not see any  rash in that area at this time.  I did prescribe a antifungal cream called clotrimazole  that should be used in those areas twice per day until the symptoms resolve and then use for 1 additional week.  Okay to use the triamcinolone  steroid cream that you have for itching up to twice per day.  Follow-up if any spread of rash or worsening symptoms.  The small bump on the right scrotum could be a small hair bump or ingrown hair.  Warm compresses to that area, gentle pressure is fine if you do see it to come to ahead, but be seen if any worsening symptoms or not improving in the next few weeks.  Take care.   Jock Itch Jock itch (tinea cruris) is an infection of the skin in the groin area that is caused by a fungus. Jock itch causes an itchy rash in the groin and upper thigh area. It usually goes away in 2-3 weeks with treatment. What are the causes? The fungus that causes jock itch may be spread by: Touching a fungal infection elsewhere on your body, such as athlete's foot, and then touching your groin area. Sharing towels or clothing, such as socks or shoes, with someone who has a fungal infection. What increases the risk? Jock itch is most common  in men and adolescent boys. You are also more likely to develop the condition if you: Are in a hot, humid climate. Wear tight-fitting clothing or wet bathing suits for long periods of time. Play sports. Are overweight. Have diabetes. Have a weakened body defense system (immune system). Sweat a lot. What are the signs or symptoms? Symptoms of jock itch may include: A red, pink, or brown rash in the groin area. Blisters may be present. The rash may spread to the thighs, the opening between the buttocks (anus), and the buttocks. Dry and scaly skin on or around the rash. Itchiness. How is this diagnosed? In most cases, your health care provider can make the diagnosis by looking at your rash. In some cases, a sample of infected skin may be scraped off.  This sample may be examined under a microscope (biopsy) or by trying to grow the fungus from the sample (culture). How is this treated? Treatment for this condition may include: Antifungal medicine to kill the fungus. This may be a skin cream, ointment, or powder, or it may be a medicine that you take by mouth (orally). Skin cream or ointment to reduce itching. Lifestyle changes, such as wearing looser clothing and caring for your skin. Follow these instructions at home: Skin care Apply skin creams, ointments, or powders exactly as told by your health care provider. Wear loose-fitting clothing that does not rub against your groin area. Men should wear boxer shorts or loose-fitting underwear. Keep your groin area clean and dry. Change your underwear every day. Change out of wet bathing suits as soon as possible. After bathing, use a separate towel to gently dry your groin area thoroughly. Using a separate towel will help prevent spreading the infection to other parts of your body. Avoid hot baths and showers. Hot water can make itching worse. Do not scratch the affected area. General instructions Take and apply over-the-counter and prescription medicines only as told by your health care provider. Do not share towels, clothing, or personal items with other people. Wash your hands often with soap and water for at least 20 seconds, especially after touching your groin area. If soap and water are not available, use hand sanitizer. When at the gym: Always wear shoes, especially in the shower and around the swimming pool. Keep any cuts covered. Disinfect any mats or equipment before using them. Shower immediately after working out. Keep all follow-up visits. This is important. Contact a health care provider if: Your rash: Gets worse or does not get better after 2 weeks of treatment. Spreads. Returns after treatment is finished. You have any of the following: A fever. New or worsening  redness, swelling, or pain around your rash. Fluid, blood, or pus coming from your rash. Summary Jock itch (tinea cruris) is a fungal infection of the skin in the groin area. The fungus can be spread by sharing clothing or by touching a fungus infection elsewhere on your body and then touching your groin area. Treatment may include antifungal medicine and lifestyle changes, such as keeping the area clean and dry. This information is not intended to replace advice given to you by your health care provider. Make sure you discuss any questions you have with your health care provider. Document Revised: 10/18/2020 Document Reviewed: 10/18/2020 Elsevier Patient Education  2024 Elsevier Inc.  Hemorrhoids Hemorrhoids are swollen veins in and around the rectum or the opening of the butt (anus). There are two types of hemorrhoids: Internal. These occur in the veins just inside  the rectum. They may poke through to the outside and become irritated and painful. External. These occur in the veins outside the anus. They can be felt as a painful swelling or hard lump near the anus. Most hemorrhoids do not cause severe problems. Often, they can be treated at home with diet and lifestyle changes. If home treatments do not help, you may need a procedure to shrink or remove the hemorrhoids. What are the causes? Hemorrhoids are caused by pressure near the anus. This pressure may be caused by: Constipation or diarrhea. Straining to poop. Pregnancy. Obesity. Sitting or riding a bike for a long time. Heavy lifting or other things that cause you to strain. Anal sex. What are the signs or symptoms? Symptoms of this condition include: Pain. Anal itching or irritation. Bleeding from the rectum. Leakage of poop (stool). Swelling of the anus. One or more lumps around the anus. How is this diagnosed? Hemorrhoids can often be diagnosed through a visual exam. Other exams or tests may also be done, such as: A  digital rectal exam. This is when your health care provider feels inside your rectum with a gloved finger. Anoscope. This is an exam of the anus using a small tube. A blood test, if you have lost a lot of blood. A sigmoidoscopy or colonoscopy. These are tests to look inside the colon using a tube with a camera on the end. How is this treated? In most cases, hemorrhoids can be treated at home with diet and lifestyle changes. If these changes do not help, you may need to have a procedure done. These procedures can make the hemorrhoids smaller or fully remove them. Common procedures include: Rubber band ligation. Rubber bands are placed at the base of the hemorrhoids to cut off their blood supply. Sclerotherapy. Medicine is put into the hemorrhoids to shrink them. Infrared coagulation. A type of light energy is used to get rid of the hemorrhoids. Hemorrhoidectomy surgery. The hemorrhoids are removed during surgery. Then, the veins that supply them are tied off. Stapled hemorrhoidopexy surgery. The base of the hemorrhoid is stapled to the wall of the rectum. Follow these instructions at home: Medicines Take over-the-counter and prescription medicines only as told by your provider. Use medicated creams or medicines that are put in the rectum (suppositories) as told by your provider. Eating and drinking  Eat foods that are high in fiber, such as beans, whole grains, and fresh fruits and vegetables. Ask your provider about taking products that have fiber added to them (fiber supplements). Reduce the amount of fat in your diet. You can do this by eating low-fat dairy products, eating less red meat, and avoiding processed foods. Drink enough fluid to keep your pee (urine) pale yellow. Managing pain and swelling  Take warm sitz baths for 20 minutes, 3-4 times a day. This can help ease pain and discomfort. You may do this in a bathtub or you can use a portable sitz bath that fits over the toilet. If  told, put ice on the affected area. It may help to use ice packs between sitz baths. Put ice in a plastic bag. Place a towel between your skin and the bag. Leave the ice on for 20 minutes, 2-3 times a day. If your skin turns bright red, remove the ice right away to prevent skin damage. The risk of damage is higher if you cannot feel pain, heat, or cold. General instructions Exercise. Ask your provider how much and what kind of exercise is  best for you. In general, you should do moderate exercise for at least 30 minutes on most days of the week (150 minutes each week). You may want to try walking, biking, or yoga. Go to the bathroom when you have the urge to poop. Do not wait. Avoid straining to poop. Keep the anus dry and clean. Use wet toilet paper or moist towelettes after you poop. Do not sit on the toilet for a long time. This can increase blood pooling and pain. Where to find more information [employer] of Diabetes and Digestive and Kidney Diseases: stagesync.si Contact a health care provider if: You have more pain and swelling that do not get better with treatment. You have trouble pooping or you are not able to poop. You have pain or inflammation outside the area of the hemorrhoids. Get help right away if: You are bleeding from your rectum and you cannot get it to stop. This information is not intended to replace advice given to you by your health care provider. Make sure you discuss any questions you have with your health care provider. Document Revised: 04/11/2022 Document Reviewed: 04/11/2022 Elsevier Patient Education  2024 Elsevier Inc.    Signed,   Reyes Pines, MD [facility], [facility] 07/16/24 12:04 PM

## 2024-07-20 ENCOUNTER — Other Ambulatory Visit (HOSPITAL_BASED_OUTPATIENT_CLINIC_OR_DEPARTMENT_OTHER): Payer: Self-pay | Admitting: Pharmacist Clinician (PhC)/ Clinical Pharmacy Specialist

## 2024-07-20 ENCOUNTER — Ambulatory Visit (HOSPITAL_COMMUNITY)
Admission: RE | Admit: 2024-07-20 | Discharge: 2024-07-20 | Disposition: A | Discharge status: Home / Self Care | Source: Ambulatory Visit | Attending: Family Medicine | Admitting: Family Medicine

## 2024-07-20 ENCOUNTER — Encounter (HOSPITAL_COMMUNITY): Payer: Self-pay

## 2024-07-20 ENCOUNTER — Ambulatory Visit: Payer: Self-pay

## 2024-07-20 VITALS — BP 156/80 | HR 65 | Temp 97.9°F | Resp 18

## 2024-07-20 DIAGNOSIS — K644 Residual hemorrhoidal skin tags: Secondary | ICD-10-CM

## 2024-07-20 DIAGNOSIS — N492 Inflammatory disorders of scrotum: Secondary | ICD-10-CM | POA: Diagnosis not present

## 2024-07-20 DIAGNOSIS — L03317 Cellulitis of buttock: Secondary | ICD-10-CM

## 2024-07-20 DIAGNOSIS — E7849 Other hyperlipidemia: Secondary | ICD-10-CM | POA: Diagnosis not present

## 2024-07-20 MED ORDER — HYDROCORTISONE (PERIANAL) 2.5 % EX CREA: 1.0000 | TOPICAL_CREAM | Freq: Two times a day (BID) | CUTANEOUS | 0 refills | Status: AC

## 2024-07-20 MED ORDER — DOXYCYCLINE HYCLATE 100 MG PO CAPS: 100.0000 mg | ORAL_CAPSULE | Freq: Two times a day (BID) | ORAL | 0 refills | Status: DC

## 2024-07-20 MED ORDER — TRAMADOL HCL 50 MG PO TABS: 50.0000 mg | ORAL_TABLET | Freq: Four times a day (QID) | ORAL | 0 refills | Status: AC | PRN

## 2024-07-20 NOTE — Telephone Encounter (Signed)
 Per chart review patient is checked in at urgent care for treatment.    Copied from CRM #[id number]. Topic: Clinical - Red Word Triage >> Jul 20, 2024  8:34 AM Burnard DEL wrote: Red Word that prompted transfer to Nurse Triage: worsening cyst on tailbone,painful hemorrhoids ,swelling >> Jul 20, 2024  8:51 AM Burnard DEL wrote: Patient is requesting a call back.He no longer wanted to hold  Reason for Disposition  Patient already left for the hospital/clinic.  Protocols used: No Contact or Duplicate Contact Call-A-AH

## 2024-07-20 NOTE — Telephone Encounter (Signed)
 Noted, visit with urgent care noted from earlier today with prescribed antibiotics.  He we will start antibiotics, advised to be seen later this week if needed by myself or one of my colleagues if I do not have availability.  Appreciated the phone call.

## 2024-07-20 NOTE — ED Triage Notes (Signed)
 Pt c/o abscess to tailbone and testicles x1wk. States saw his PCP last week and given creams with no relief. States areas are larger and painful.

## 2024-07-20 NOTE — Discharge Instructions (Addendum)
 Take doxycycline  100 mg --1 capsule 2 times daily for 7 days  Take tramadol  50 mg-- 1 tablet every 6 hours as needed for pain.  This medication can make you sleepy or dizzy  You can also take Tylenol /acetaminophen  500 mg--2 every 6 hours as needed for pain  Apply Anusol /hydrocortisone  2.5% cream to the external hemorrhoids twice daily until improving.  If you have been having trouble with constipation, taking Colace/docusate sodium stool softener over-the-counter or MiraLAX/polyethylene glycol over-the-counter could help.  Use do warm soaks for 10 to 15 minutes, 2 or 3 times a day.  This helps circulation in these infected areas and can help healing.  Please return to be seen if you are worse in any way.

## 2024-07-20 NOTE — Telephone Encounter (Signed)
 FYI pt is going to Urgent care

## 2024-07-20 NOTE — ED Provider Notes (Signed)
 [facility]    CSN: [id number] Arrival date & time: 07/20/24  1121      History   Chief Complaint Chief Complaint  Patient presents with   Abscess    I saw Dr. Reyes Pines late last week with several concerns. He prescribed an ointment but the condition has gotten much worse. Much pain per a very hard, 3 diameter Cyst/abscess? at my tailbone. Two other lesions on testicle - Entered by patient    HPI Jeremy Hendricks is a 68 y.o. male.    Abscess   Here for swelling and pain and redness on his right upper buttock and on his scrotum.  For about 2 weeks he has had some rash she red areas that have been itching and somewhat painful.  He saw his primary care on December 5.  The impression at that time was that he had some tinea corporis.  He was prescribed some clotrimazole .  He also had an external hemorrhoid that was not tender at the time of exam on December 5.  He states that the areas that had rash have become very painful and are enlarging and hard.  No fever or chills and no vomiting.  The external hemorrhoid is also become more painful.  NKDA  He has not been taking anything for pain so far.  Medications include Truvada  and Repatha , but he does not take any blood thinners Past Medical History:  Diagnosis Date   Atrial fibrillation (HCC)    postoperative   Chronic diastolic CHF (congestive heart failure) (HCC)    CKD (chronic kidney disease), stage II    Coronary artery disease    a. s/p CABG 2002. b. s/p redo 2012.   DJD (degenerative joint disease)    Gout    History of ETT    a. 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   HLD (hyperlipidemia) 06/11/2016   Hypogonadism in male 09/09/2015   CAD, ARTERY BYPASS GRAFT 10/26/2009   HYPERLIPIDEMIA 10/24/2009   Osteoarthritis 10/24/2009   History of cardiovascular disorder 10/24/2009    Past Surgical History:  Procedure Laterality Date   CORONARY ARTERY BYPASS GRAFT  2002   CORONARY ARTERY BYPASS GRAFT  08-2010   L-LAD remained from original CABG; new grafts incl L radial- PDA + RIMA-RI   LEFT HEART CATH AND CORS/GRAFTS ANGIOGRAPHY N/A 12/31/2023   Procedure: LEFT HEART CATH AND CORS/GRAFTS ANGIOGRAPHY;  Surgeon: Verlin Lonni BIRCH, MD;  Location: [facility];  Service: Cardiovascular;  Laterality: N/A;   VASECTOMY         Home Medications    Prior to Admission medications   Medication Sig Start Date End Date Taking? Authorizing Provider  doxycycline  (VIBRAMYCIN ) 100 MG capsule Take 1 capsule (100 mg total) by mouth 2 (two) times daily for 7 days. 07/20/24 07/27/24 Yes Yiselle Babich K, MD  hydrocortisone  (ANUSOL -HC) 2.5 % rectal cream Place 1 Application rectally 2 (two) times daily.  07/20/24  Yes Vonna Sharlet POUR, MD  traMADol  (ULTRAM ) 50 MG tablet Take 1 tablet (50 mg total) by mouth every 6 (six) hours as needed (pain). 07/20/24  Yes Vonna Sharlet POUR, MD  albuterol  (VENTOLIN  HFA) 108 (90 Base) MCG/ACT inhaler Inhale 1-2 puffs into the lungs every 6 (six) hours as needed. 06/17/23   Burnette, Jennifer M, PA-C  allopurinol  (ZYLOPRIM ) 300 MG tablet Take 1 tablet (300 mg total) by mouth every other day. 06/08/24   Dolphus Reiter, MD  aspirin  EC 81 MG tablet Take 81 mg by mouth daily. Swallow whole.    [provider]  clotrimazole  (LOTRIMIN ) 1 % cream Apply 1 Application topically 2 (two) times daily. 07/16/24   Levora Reyes SAUNDERS, MD  diazepam (VALIUM) 5 MG tablet Take 5 mg by mouth every  12 (twelve) hours as needed for anxiety. 06/05/23   [provider]  emtricitabine -tenofovir  (TRUVADA ) 200-300 MG tablet TAKE 1 TABLET BY MOUTH EVERY DAY 02/11/24   Levora Reyes SAUNDERS, MD  Evolocumab  (REPATHA  SURECLICK) 140 MG/ML SOAJ Inject 140 mg into the skin every 14 (fourteen) days. 06/16/24   Verlin Lonni BIRCH, MD  furosemide  (LASIX ) 20 MG tablet Take 1 tablet (20 mg total) by mouth every other day. 08/26/23   Levora Reyes SAUNDERS, MD  isosorbide  mononitrate (IMDUR ) 30 MG 24 hr tablet Take 1 tablet (30 mg total) by mouth daily. 10/04/23   Thukkani, Arun K, MD  metoprolol  succinate (TOPROL  XL) 25 MG 24 hr tablet Take 1 tablet (25 mg total) by mouth at bedtime. 10/04/23   Thukkani, Arun K, MD  MITIGARE  0.6 MG CAPS TAKE 1 CAPSULE BY MOUTH DAILY AS NEEDED FOR GOUT FLARES 04/22/23   Cheryl Waddell HERO, PA-C  Multiple Vitamin (MULTIVITAMIN) tablet Take 1 tablet by mouth daily.    [provider]  nitroGLYCERIN  (NITROSTAT ) 0.4 MG SL tablet Place 1 tablet (0.4 mg total) under the tongue every 5 (five) minutes as needed for chest pain. 10/04/23   Thukkani, Arun K, MD  pantoprazole  (PROTONIX ) 20 MG tablet TAKE 1 TABLET BY MOUTH EVERY DAY 03/19/24   Thukkani, Arun K, MD  rosuvastatin  (CRESTOR ) 40 MG tablet TAKE 1 TABLET BY MOUTH EVERY DAY 12/02/23   Thukkani, Arun K, MD  sertraline  (ZOLOFT ) 25 MG tablet TAKE 1 TABLET (25 MG TOTAL) BY MOUTH DAILY. Patient not taking: Reported on 07/16/2024 12/02/23   Levora Reyes SAUNDERS, MD  Testosterone  30 MG/ACT SOLN APPLY 1 PUMP EVERY DAY 02/24/24   Levora Reyes SAUNDERS, MD  triamcinolone  cream (KENALOG ) 0.1 % Apply 1 Application topically 2 (two) times daily. 06/03/24   Gladis Elsie BROCKS, PA-C  valACYclovir  (VALTREX ) 500 MG tablet Take 1 tablet (500 mg total) by mouth 2 (two) times daily. 08/26/23   Levora Reyes SAUNDERS, MD    Family History Family History  Problem Relation Age of Onset   Heart attack Father 30   Hypertension Father    Cancer Mother        Breast  cancer   Cancer Other        family hx of   Coronary artery disease Other        family hx of   Hyperlipidemia Other        family hx of   Hypertension Paternal Grandfather    Healthy Son    Healthy Daughter    Stroke Neg Hx    Colon cancer Neg Hx     Social History Social History   Tobacco Use  Smoking status: Former    Current packs/day: 0.00    Types: Cigarettes    Quit date: 08/14/1999    Years since quitting: 24.9    Passive exposure: Past   Smokeless tobacco: Never   Tobacco comments:    Social smoker  Vaping Use   Vaping status: Never Used  Substance Use Topics   Alcohol use: Yes    Alcohol/week: 14.0 standard drinks of alcohol    Types: 14 Shots of liquor per week    Comment: 1oz-2oz daily    Drug use: No     Allergies   Patient has no known allergies.   Review of Systems Review of Systems   Physical Exam Triage Vital Signs ED Triage Vitals  Encounter Vitals Group     BP 07/20/24 1154 (!) 156/80     Girls Systolic BP Percentile --      Girls Diastolic BP Percentile --      Boys Systolic BP Percentile --      Boys Diastolic BP Percentile --      Pulse Rate 07/20/24 1154 65     Resp 07/20/24 1154 18     Temp 07/20/24 1154 97.9 F (36.6 C)     Temp Source 07/20/24 1154 Oral     SpO2 07/20/24 1154 97 %     Weight --      Height --      Head Circumference --      Peak Flow --      Pain Score 07/20/24 1152 4     Pain Loc --      Pain Education --      Exclude from Growth Chart --    No data found.  Updated Vital Signs BP (!) 156/80 (BP Location: Left Arm)   Pulse 65   Temp 97.9 F (36.6 C) (Oral)   Resp 18   SpO2 97%   Visual Acuity Right Eye Distance:   Left Eye Distance:   Bilateral Distance:    Right Eye Near:   Left Eye Near:    Bilateral Near:     Physical Exam Vitals reviewed.  Constitutional:      General: He is not in acute distress.    Appearance: He is not ill-appearing, toxic-appearing or diaphoretic.  HENT:      Mouth/Throat:     Mouth: Mucous membranes are moist.  Genitourinary:    Comments: Chaperone was present during the time of exam.  He has on his right lateral scrotum and area of induration and erythema about 1.5 cm in diameter.  There is no fluctuance there.  Then on the posterior aspect of the scrotum there is a similar area about 1 cm in diameter.  Again there is no fluctuance there.  He has 2 or 3 other spots that have tiny little areas that are raised between those 2 swollen spots that could be developing induration.  Also on the right upper buttock at the gluteal crevice there is an area of erythema and induration about 3 cm in diameter.  It is all firm and tender.  There is no fluctuance.  I did not actually examine the hemorrhoid. Skin:    Coloration: Skin is not jaundiced or pale.  Neurological:     Mental Status: He is alert and oriented to person, place, and time.  Psychiatric:        Behavior: Behavior normal.      UC Treatments / Results  Labs (all labs ordered are listed,  but only abnormal results are displayed) Labs Reviewed - No data to display  EKG   Radiology No results found.  Procedures Procedures (including critical care time)  Medications Ordered in UC Medications - No data to display  Initial Impression / Assessment and Plan / UC Course  I have reviewed the triage vital signs and the nursing notes.  Pertinent labs & imaging results that were available during my care of the patient were reviewed by me and considered in my medical decision making (see chart for details).     Doxycycline  for cellulitis.  Tramadol  sent in for pain, and I have recommended Tylenol  also.  He will do warm soaks.  When I went to first discharge the patient and he was already dressed, he mention the external hemorrhoid.  Anusol  HC was then sent to the pharmacy.  He will return if he is worsening anyway. Final Clinical Impressions(s) / UC Diagnoses   Final diagnoses:   Cellulitis of buttock  Cellulitis of scrotum  External hemorrhoid     Discharge Instructions      Take doxycycline  100 mg --1 capsule 2 times daily for 7 days  Take tramadol  50 mg-- 1 tablet every 6 hours as needed for pain.  This medication can make you sleepy or dizzy  You can also take Tylenol /acetaminophen  500 mg--2 every 6 hours as needed for pain  Apply Anusol /hydrocortisone  2.5% cream to the external hemorrhoids twice daily until improving.  If you have been having trouble with constipation, taking Colace/docusate sodium stool softener over-the-counter or MiraLAX/polyethylene glycol over-the-counter could help.  Use do warm soaks for 10 to 15 minutes, 2 or 3 times a day.  This helps circulation in these infected areas and can help healing.  Please return to be seen if you are worse in any way.      ED Prescriptions     Medication Sig Dispense Auth. Provider   doxycycline  (VIBRAMYCIN ) 100 MG capsule Take 1 capsule (100 mg total) by mouth 2 (two) times daily for 7 days. 14 capsule Kaesha Kirsch K, MD   traMADol  (ULTRAM ) 50 MG tablet Take 1 tablet (50 mg total) by mouth every 6 (six) hours as needed (pain). 12 tablet Tonisha Silvey K, MD   hydrocortisone  (ANUSOL -HC) 2.5 % rectal cream Place 1 Application rectally 2 (two) times daily. 30 g Vonna Sharlet POUR, MD      I have reviewed the PDMP during this encounter.   Vonna Sharlet POUR, MD 07/20/24 1225

## 2024-07-21 ENCOUNTER — Encounter: Payer: Self-pay | Admitting: Family Medicine

## 2024-07-21 LAB — LIPID PANEL
Chol/HDL Ratio: 2.4 ratio (ref 0.0–5.0)
Cholesterol, Total: 80 mg/dL — ABNORMAL LOW (ref 100–199)
HDL: 34 mg/dL — ABNORMAL LOW (ref >39)
LDL Chol Calc (NIH): 30 mg/dL (ref 0–99)
Triglycerides: 76 mg/dL (ref 0–149)
VLDL Cholesterol Cal: 16 mg/dL (ref 5–40)

## 2024-07-23 ENCOUNTER — Encounter: Payer: Self-pay | Admitting: Pulmonary Disease

## 2024-07-27 ENCOUNTER — Encounter: Payer: Self-pay | Admitting: Family Medicine

## 2024-07-27 DIAGNOSIS — N509 Disorder of male genital organs, unspecified: Secondary | ICD-10-CM

## 2024-07-27 DIAGNOSIS — B9689 Other specified bacterial agents as the cause of diseases classified elsewhere: Secondary | ICD-10-CM

## 2024-07-27 MED ORDER — DOXYCYCLINE HYCLATE 100 MG PO CAPS: 100.0000 mg | ORAL_CAPSULE | Freq: Two times a day (BID) | ORAL | 0 refills | Status: AC

## 2024-07-27 NOTE — Telephone Encounter (Signed)
 Would you like for patient to come back in for another evaluation?

## 2024-07-29 ENCOUNTER — Inpatient Hospital Stay: Admission: RE | Admit: 2024-07-29 | Discharge: 2024-07-29 | Attending: Pulmonary Disease

## 2024-07-29 DIAGNOSIS — J90 Pleural effusion, not elsewhere classified: Secondary | ICD-10-CM

## 2024-07-30 ENCOUNTER — Ambulatory Visit: Payer: Self-pay | Admitting: Pharmacist Clinician (PhC)/ Clinical Pharmacy Specialist

## 2024-08-04 ENCOUNTER — Ambulatory Visit: Payer: Self-pay | Admitting: Pulmonary Disease

## 2024-08-09 ENCOUNTER — Other Ambulatory Visit: Payer: Self-pay | Admitting: Rheumatology

## 2024-08-10 NOTE — Telephone Encounter (Signed)
 Last Fill: 06/08/2024   Labs: 02/26/2024 CMP: glucose 100, GFR 54.92 12/26/2023 CBC: WBC 15.0, platelets 146 Uric acid: 07/30/2023 7.5  Next Visit: 09/10/2024  Last Visit: 03/06/2024  DX: Idiopathic chronic gout of multiple sites without tophus   Current Dose per office note on 03/06/2024: He is on allopurinol  300 mg every other day which he tolerates well.   Okay to refill Allopurinol ?

## 2024-08-12 ENCOUNTER — Other Ambulatory Visit: Payer: Self-pay | Admitting: Family Medicine

## 2024-08-12 DIAGNOSIS — E291 Testicular hypofunction: Secondary | ICD-10-CM

## 2024-08-12 DIAGNOSIS — R7989 Other specified abnormal findings of blood chemistry: Secondary | ICD-10-CM

## 2024-08-13 ENCOUNTER — Other Ambulatory Visit: Payer: Self-pay | Admitting: Family Medicine

## 2024-08-13 DIAGNOSIS — Z113 Encounter for screening for infections with a predominantly sexual mode of transmission: Secondary | ICD-10-CM

## 2024-08-13 DIAGNOSIS — Z79899 Other long term (current) drug therapy: Secondary | ICD-10-CM

## 2024-08-14 ENCOUNTER — Ambulatory Visit: Payer: Self-pay

## 2024-08-14 ENCOUNTER — Ambulatory Visit
Admission: RE | Admit: 2024-08-14 | Discharge: 2024-08-14 | Disposition: A | Discharge status: Home / Self Care | Source: Ambulatory Visit | Attending: Emergency Medicine

## 2024-08-14 VITALS — BP 155/86 | HR 65 | Temp 98.3°F | Resp 15

## 2024-08-14 DIAGNOSIS — L0201 Cutaneous abscess of face: Secondary | ICD-10-CM | POA: Diagnosis not present

## 2024-08-14 MED ORDER — DOXYCYCLINE HYCLATE 100 MG PO TABS
100.0000 mg | ORAL_TABLET | Freq: Once | ORAL | Status: AC
  Administered 2024-08-14: 100 mg via ORAL

## 2024-08-14 MED ORDER — DOXYCYCLINE HYCLATE 100 MG PO TABS: 100.0000 mg | ORAL_TABLET | Freq: Two times a day (BID) | ORAL | 0 refills | Status: AC

## 2024-08-14 NOTE — Telephone Encounter (Signed)
 Requested Prescriptions   Pending Prescriptions Disp Refills   Testosterone  30 MG/ACT SOLN [Pharmacy Med Name: TESTOSTERONE  30 MG/1.5 ML PUMP] 90 mL     Sig: APPLY 1 PUMP EVERY DAY     Date of patient request: 08/15/23 Last office visit: 07/16/2024 Upcoming visit: 08/13/2024 Date of last refill: 02/24/24 Last refill amount: 90mL

## 2024-08-14 NOTE — Telephone Encounter (Signed)
 Med discussed at his physical in July with labs at that time.  Refill ordered.

## 2024-08-14 NOTE — ED Triage Notes (Signed)
 Pt reports had pimple like area that came up yesterday and today swelling has gotten worse. Denies drainage. Put hydrocortisone  and iced area that hasn't helped.

## 2024-08-14 NOTE — Discharge Instructions (Addendum)
 Take the doxycyline twice daily with food x 10 days  Use the mupirocin  ointment to your nose twice daily x 5 days  Warm compress to the site daily to help encourage drainage   If the area becomes fluctuant or squishy or you do not have improvement please seek follow-up care.  Seek immediate care at the nearest emergency department if you develop fever or drastic worsening of the abscess.

## 2024-08-14 NOTE — Telephone Encounter (Signed)
 FYI - patient going to UC

## 2024-08-14 NOTE — Telephone Encounter (Signed)
 FYI Only or Action Required?: FYI only for provider: UC advised.  Patient was last seen in primary care on 07/16/2024 by Levora Reyes SAUNDERS, MD.  Called Nurse Triage reporting Rash.  Symptoms began yesterday.  Interventions attempted: Ice/heat application.  Symptoms are: gradually worsening.  Triage Disposition: See Physician Within 24 Hours  Patient/caregiver understands and will follow disposition?: Yes                                  1. APPEARANCE of RASH: What does the rash look like? (e.g., blisters, dry flaky skin, red spots, redness, sores)     Started as small pimple, now swollen and red with dark center 2. LOCATION: Where is the rash located?      Under right jaw line 3. NUMBER: How many spots are there?      1 4. SIZE: How big are the spots? (e.g., inches, cm; or compare to size of pinhead, tip of pen, eraser, pea)      Redness and swelling are about 2.5 inches in diameter  5. ONSET: When did the rash start?      Yesterday morning 6. ITCHING: Does the rash itch? If Yes, ask: How bad is the itch?  (Scale 0-10; or none, mild, moderate, severe)     Denies 7. PAIN: Does the rash hurt? If Yes, ask: How bad is the pain?  (Scale 0-10; or none, mild, moderate, severe)     Rates pain 1-2 at this time 8. OTHER SYMPTOMS: Do you have any other symptoms? (e.g., fever)     Denies fever, denies chest pain, denies difficulty breathing, denies difficulty swallowing    This RN advised in-person evaluation today. No availability with PCP office or alternate offices within region. This RN advised UC. Patient has appointment with UC this evening.   Copied from CRM [id number]. Topic: Clinical - Red Word Triage >> Aug 14, 2024 10:30 AM Deleta RAMAN wrote: Red Word that prompted transfer to Nurse Triage: staff infection below jaw line  Reason for Disposition  Looks like a boil, infected sore, deep ulcer or other infected rash  Protocols used:  Rash or Redness - Localized-A-AH

## 2024-08-14 NOTE — ED Provider Notes (Signed)
 [facility]    CSN: [id number] Arrival date & time: 08/14/24  1817      History   Chief Complaint Chief Complaint  Patient presents with   Appointment    HPI AIKEN WITHEM is a 69 y.o. male.   Patient presents to clinic over concern of abscess to the right jawline that started yesterday.  He is concerned over how much worse its gotten over the past day.  He has done cool compresses and some hydrocortisone  with minor relief.  Has not had fever or tachycardia.  Area has not drained.  The area is very tender.  He does shave daily.  Did recently have treatment with oral doxycycline  for multiple abscesses in the groin/testicular area.  Those have since resolved and are knot- like firm areas.  The history is provided by the patient and medical records.    Past Medical History:  Diagnosis Date   Atrial fibrillation (HCC)    postoperative   Chronic diastolic CHF (congestive heart failure) (HCC)    CKD (chronic kidney disease), stage II    Coronary artery disease    a. s/p CABG 2002. b. s/p redo 2012.   DJD (degenerative joint disease)    Gout    History of ETT    a. 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   HLD (hyperlipidemia) 06/11/2016   Hypogonadism in male 09/09/2015   CAD, ARTERY BYPASS GRAFT 10/26/2009   HYPERLIPIDEMIA 10/24/2009   Osteoarthritis 10/24/2009   History of cardiovascular disorder 10/24/2009    Past Surgical History:  Procedure Laterality Date   CORONARY ARTERY BYPASS  GRAFT  2002   CORONARY ARTERY BYPASS GRAFT  08-2010   L-LAD remained from original CABG; new grafts incl L radial- PDA + RIMA-RI   LEFT HEART CATH AND CORS/GRAFTS ANGIOGRAPHY N/A 12/31/2023   Procedure: LEFT HEART CATH AND CORS/GRAFTS ANGIOGRAPHY;  Surgeon: Verlin Lonni BIRCH, MD;  Location: [facility];  Service: Cardiovascular;  Laterality: N/A;   VASECTOMY         Home Medications    Prior to Admission medications  Medication Sig Start Date End Date Taking? Authorizing Provider  doxycycline  (VIBRA -TABS) 100 MG tablet Take 1 tablet (100 mg total) by mouth 2 (two) times daily for 10 days. 08/14/24 08/24/24 Yes Raymont Andreoni  N, FNP  albuterol  (VENTOLIN  HFA) 108 (90 Base) MCG/ACT inhaler Inhale 1-2 puffs into the lungs every 6 (six) hours as needed. 06/17/23   Vivienne Delon HERO, PA-C  allopurinol  (ZYLOPRIM ) 300 MG tablet TAKE 1 TABLET (300 MG TOTAL) BY MOUTH EVERY OTHER DAY. 08/10/24   Cheryl Waddell HERO, PA-C  aspirin  EC 81 MG tablet Take 81 mg by mouth daily. Swallow whole.    [provider]  clotrimazole  (LOTRIMIN ) 1 % cream Apply 1 Application topically 2 (two) times daily. 07/16/24   Levora Reyes SAUNDERS, MD  diazepam (VALIUM) 5 MG tablet Take 5 mg by mouth every 12 (  twelve) hours as needed for anxiety. 06/05/23   [provider]  emtricitabine -tenofovir  (TRUVADA ) 200-300 MG tablet TAKE 1 TABLET BY MOUTH EVERY DAY 08/14/24   Levora Reyes SAUNDERS, MD  Evolocumab  (REPATHA  SURECLICK) 140 MG/ML SOAJ Inject 140 mg into the skin every 14 (fourteen) days. 06/16/24   Verlin Lonni BIRCH, MD  furosemide  (LASIX ) 20 MG tablet Take 1 tablet (20 mg total) by mouth every other day. 08/26/23   Levora Reyes SAUNDERS, MD  hydrocortisone  (ANUSOL -HC) 2.5 % rectal cream Place 1 Application rectally 2 (two) times daily. 07/20/24   Vonna Sharlet POUR, MD  isosorbide  mononitrate (IMDUR ) 30 MG 24 hr tablet Take 1 tablet (30 mg total) by mouth daily. 10/04/23   Thukkani, Arun K, MD  metoprolol   succinate (TOPROL  XL) 25 MG 24 hr tablet Take 1 tablet (25 mg total) by mouth at bedtime. 10/04/23   Thukkani, Arun K, MD  Multiple Vitamin (MULTIVITAMIN) tablet Take 1 tablet by mouth daily.    [provider]  nitroGLYCERIN  (NITROSTAT ) 0.4 MG SL tablet Place 1 tablet (0.4 mg total) under the tongue every 5 (five) minutes as needed for chest pain. 10/04/23   Thukkani, Arun K, MD  pantoprazole  (PROTONIX ) 20 MG tablet TAKE 1 TABLET BY MOUTH EVERY DAY 03/19/24   Thukkani, Arun K, MD  rosuvastatin  (CRESTOR ) 40 MG tablet TAKE 1 TABLET BY MOUTH EVERY DAY 12/02/23   Thukkani, Arun K, MD  sertraline  (ZOLOFT ) 25 MG tablet TAKE 1 TABLET (25 MG TOTAL) BY MOUTH DAILY. Patient not taking: Reported on 07/16/2024 12/02/23   Levora Reyes SAUNDERS, MD  Testosterone  30 MG/ACT SOLN APPLY 1 PUMP EVERY DAY 08/14/24   Levora Reyes SAUNDERS, MD  traMADol  (ULTRAM ) 50 MG tablet Take 1 tablet (50 mg total) by mouth every 6 (six) hours as needed (pain). 07/20/24   Vonna Sharlet POUR, MD  valACYclovir  (VALTREX ) 500 MG tablet Take 1 tablet (500 mg total) by mouth 2 (two) times daily. 08/26/23   Levora Reyes SAUNDERS, MD    Family History Family History  Problem Relation Age of Onset   Heart attack Father 89   Hypertension Father    Cancer Mother        Breast cancer   Cancer Other        family hx of   Coronary artery disease Other        family hx of   Hyperlipidemia Other        family hx of   Hypertension Paternal Grandfather    Healthy Son    Healthy Daughter    Stroke Neg Hx    Colon cancer Neg Hx     Social History Social History[1]   Allergies   Patient has no known allergies.   Review of Systems Review of Systems  Per HPI  Physical Exam Triage Vital Signs ED Triage Vitals  Encounter Vitals Group     BP 08/14/24 1921 (!) 155/86     Girls Systolic BP Percentile --      Girls Diastolic BP Percentile --      Boys Systolic BP Percentile --      Boys Diastolic BP Percentile --      Pulse Rate 08/14/24  1921 65     Resp 08/14/24 1921 15     Temp 08/14/24 1921 98.3 F (36.8 C)     Temp Source 08/14/24 1921 Oral     SpO2 08/14/24 1921 97 %     Weight --  Height --      Head Circumference --      Peak Flow --      Pain Score 08/14/24 1918 6     Pain Loc --      Pain Education --      Exclude from Growth Chart --    No data found.  Updated Vital Signs BP (!) 155/86 (BP Location: Right Arm)   Pulse 65   Temp 98.3 F (36.8 C) (Oral)   Resp 15   SpO2 97%   Visual Acuity Right Eye Distance:   Left Eye Distance:   Bilateral Distance:    Right Eye Near:   Left Eye Near:    Bilateral Near:     Physical Exam Vitals and nursing note reviewed.  Constitutional:      Appearance: Normal appearance.  HENT:     Head: Normocephalic and atraumatic.     Right Ear: External ear normal.     Left Ear: External ear normal.     Nose: Nose normal.     Mouth/Throat:     Mouth: Mucous membranes are moist.  Eyes:     Conjunctiva/sclera: Conjunctivae normal.  Cardiovascular:     Rate and Rhythm: Normal rate.  Pulmonary:     Effort: Pulmonary effort is normal. No respiratory distress.  Skin:    General: Skin is warm and dry.     Findings: Abscess present.      Neurological:     General: No focal deficit present.     Mental Status: He is alert.  Psychiatric:        Mood and Affect: Mood normal.      UC Treatments / Results  Labs (all labs ordered are listed, but only abnormal results are displayed) Labs Reviewed - No data to display  EKG   Radiology No results found.  Procedures Procedures (including critical care time)  Medications Ordered in UC Medications  doxycycline  (VIBRA -TABS) tablet 100 mg (100 mg Oral Given 08/14/24 1949)    Initial Impression / Assessment and Plan / UC Course  I have reviewed the triage vital signs and the nursing notes.  Pertinent labs & imaging results that were available during my care of the patient were reviewed by me and  considered in my medical decision making (see chart for details).  Vitals and triage reviewed, patient is hemodynamically stable.  Tender and in the rated abscess to the right jawline.  Area is superficial.  Without tachycardia or fevers, low concern for systemic illness such as sepsis.  Will restart doxycycline  for abscess and encouraged warm compress.  Can trial mupirocin  ointment to bilateral nares to help kill MRSA.  First dose of doxycycline  given in clinic due to pharmacy closure.  Plan of care, follow-up care return precautions given, no questions at this time.  Pain management discussed.   Final Clinical Impressions(s) / UC Diagnoses   Final diagnoses:  Abscess of face     Discharge Instructions      Take the doxycyline twice daily with food x 10 days  Use the mupirocin  ointment to your nose twice daily x 5 days  Warm compress to the site daily to help encourage drainage   If the area becomes fluctuant or squishy or you do not have improvement please seek follow-up care.  Seek immediate care at the nearest emergency department if you develop fever or drastic worsening of the abscess.      ED Prescriptions     Medication Sig  Dispense Auth. Provider   doxycycline  (VIBRA -TABS) 100 MG tablet Take 1 tablet (100 mg total) by mouth 2 (two) times daily for 10 days. 20 tablet Dreama, Fynn Adel  N, FNP      PDMP not reviewed this encounter.    [1]  Social History Tobacco Use   Smoking status: Former    Current packs/day: 0.00    Types: Cigarettes    Quit date: 08/14/1999    Years since quitting: 25.0    Passive exposure: Past   Smokeless tobacco: Never   Tobacco comments:    Social smoker  Vaping Use   Vaping status: Never Used  Substance Use Topics   Alcohol use: Yes    Alcohol/week: 14.0 standard drinks of alcohol    Types: 14 Shots of liquor per week    Comment: 1oz-2oz daily    Drug use: No     Dreama Lynnann SAILOR, FNP 08/14/24 1953  "

## 2024-08-15 NOTE — Telephone Encounter (Signed)
 Noted urgent care visit, treatment for abscess.

## 2024-08-18 NOTE — Progress Notes (Signed)
 "  Office Visit Note  Patient: Jeremy Hendricks             Date of Birth: October 15, 1955           MRN: [id number]             PCP: Levora Reyes SAUNDERS, MD Referring: Levora Reyes SAUNDERS, MD Visit Date: 08/24/2024 Occupation: Data Unavailable  Subjective:  Medication management  History of Present Illness: Jeremy Hendricks is a 69 y.o. male with gout and osteoarthritis.  He returns today after his last visit in July 2025.  He states he increase allopurinol  dose to 300 mg daily.  He has not had a gout flare.  He did not have to take Mitigare .  He states he been watching his diet and had intentional weight loss.  He has been watching purine intake.  He drinks about an ounce of alcohol a day.  He minimal discomfort in his joints after being active at the gym.  He denies any chronic joint discomfort.  Patient recently developed an abscess underneath his right side of chin.  He was treated with doxycycline .    Activities of Daily Living:  Patient reports morning stiffness for 5 minutes.   Patient Denies nocturnal pain.  Difficulty dressing/grooming: Denies Difficulty climbing stairs: Denies Difficulty getting out of chair: Denies Difficulty using hands for taps, buttons, cutlery, and/or writing: Denies  Review of Systems  Constitutional:  Negative for fatigue.  HENT:  Negative for mouth sores and mouth dryness.   Eyes:  Negative for dryness.  Respiratory:  Negative for shortness of breath.   Cardiovascular:  Negative for chest pain and palpitations.  Gastrointestinal:  Negative for blood in stool, constipation and diarrhea.  Endocrine: Negative for increased urination.  Genitourinary:  Negative for involuntary urination.  Musculoskeletal:  Positive for morning stiffness. Negative for joint pain, gait problem, joint pain, joint swelling, myalgias, muscle weakness, muscle tenderness and myalgias.  Skin:  Negative for color change, rash, hair loss and sensitivity to sunlight.  Allergic/Immunologic:  Positive for susceptible to infections.  Neurological:  Negative for dizziness and headaches.  Hematological:  Negative for swollen glands.  Psychiatric/Behavioral:  Positive for sleep disturbance. Negative for depressed mood. The patient is nervous/anxious.     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 s/p CABG 2002. b. s/p redo 2012.   DJD (degenerative joint disease)    GERD (gastroesophageal reflux disease)    Gout    History of ETT  a. ETT 6/16:  normal    Family History  Problem Relation Age of Onset   Heart attack Father 60   Hypertension Father    Alcohol abuse Father    Heart disease Father    Cancer Mother        Breast cancer   Arthritis Mother    Cancer Other        family hx of   Coronary artery disease Other        family hx of   Hyperlipidemia Other        family hx of   Hypertension Paternal Grandfather    Healthy Son    Healthy Daughter    Stroke Neg Hx    Colon cancer Neg Hx    Past Surgical History:  Procedure Laterality Date   CORONARY ARTERY BYPASS  GRAFT  2002   CORONARY ARTERY BYPASS GRAFT  08-2010   L-LAD remained from original CABG; new grafts incl L radial- PDA + RIMA-RI   LEFT HEART CATH AND CORS/GRAFTS ANGIOGRAPHY N/A 12/31/2023   Procedure: LEFT HEART CATH AND CORS/GRAFTS ANGIOGRAPHY;  Surgeon: Verlin Lonni BIRCH, MD;  Location: [facility];  Service: Cardiovascular;  Laterality: N/A;   VASECTOMY     Social History[1] Social History   Social History Narrative   Single   Education: College   Exercise: Yes     Immunization History  Administered Date(s) Administered   Fluad Quad(high Dose 65+) 08/17/2022   Fluad Trivalent(High Dose 65+) 04/09/2023   Hepatitis B 09/20/2000   INFLUENZA, HIGH DOSE SEASONAL PF 06/03/2024   Influenza Split 05/14/2015, 06/11/2016   Influenza,inj,Quad PF,6+ Mos 04/04/2017, 04/15/2018, 05/05/2019, 04/20/2020   Influenza-Unspecified 04/04/2017   Moderna Covid-19 Fall Seasonal Vaccine 69yrs & older 06/01/2022   PFIZER Comirnaty(Gray Top)Covid-19 Tri-Sucrose Vaccine 12/29/2020   PFIZER(Purple Top)SARS-COV-2 Vaccination 10/24/2019, 11/17/2019, 06/09/2020, 05/16/2021, 02/14/2022   PNEUMOCOCCAL CONJUGATE-20 02/14/2022   Pfizer Covid-19 Vaccine Bivalent Booster 59yrs & up 05/16/2021, 02/14/2022   Tdap 04/08/2012, 02/11/2017, 10/06/2023   Unspecified SARS-COV-2 Vaccination 06/01/2022, 06/03/2024   Zoster Recombinant(Shingrix) 08/17/2022, 01/18/2023   Zoster, Live 08/13/2013     Objective: Vital Signs: BP 129/73 (BP Location: Left Arm, Patient Position: Sitting, Cuff Size: Normal)   Pulse 66   Temp (!) 96 F (35.6 C)   Resp 15   Ht 5' 7 (1.702 m)   Wt 178 lb (80.7 kg)   BMI 27.88 kg/m    Physical Exam Vitals and nursing note reviewed.  Constitutional:      Appearance: He is well-developed.  HENT:     Head: Normocephalic and atraumatic.  Eyes:     Conjunctiva/sclera: Conjunctivae normal.     Pupils: Pupils are equal, round, and reactive to light.  Cardiovascular:     Rate  and Rhythm: Normal rate and regular rhythm.     Heart sounds: Normal heart sounds.  Pulmonary:     Effort: Pulmonary effort is normal.     Breath sounds: Normal breath sounds.  Abdominal:     General: Bowel sounds are normal.     Palpations: Abdomen is soft.  Musculoskeletal:     Cervical back: Normal range of motion and neck supple.  Skin:    General: Skin is warm and dry.     Capillary Refill: Capillary refill takes less than 2 seconds.     Comments: A small cystic lesion was noted under the right jawline.  Neurological:     Mental Status: He is alert and oriented to person,  place, and time.  Psychiatric:        Behavior: Behavior normal.      Musculoskeletal Exam: Cervical spine was in good range of motion.  He had limited with forward flexion and extension of the lumbar spine.  Shoulders, elbows, wrist joints with good range of motion.  He had bilateral PIP and DIP thickening with no synovitis.  Hip joints and knee joints in good range of motion.  There was no tenderness over ankles or MTPs.  CDAI Exam: CDAI Score: -- Patient Global: --; Provider Global: -- Swollen: --; Tender: -- Joint Exam 08/24/2024   No joint exam has been documented for this visit   There is currently no information documented on the homunculus. Go to the Rheumatology activity and complete the homunculus joint exam.  Investigation: No additional findings.  Imaging: CT Chest Wo Contrast Result Date: 07/30/2024 EXAM: CT CHEST WITHOUT CONTRAST 07/29/2024 08:44:24 AM TECHNIQUE: CT of the chest was performed without the administration of intravenous contrast. Multiplanar reformatted images are provided for review. Automated exposure control, iterative reconstruction, and/or weight based adjustment of the mA/kV was utilized to reduce the radiation dose to as low as reasonably achievable. COMPARISON: 06/25/2023 CLINICAL HISTORY: Pleural effusion, known or suspected (Ped 0-17y) FINDINGS: MEDIASTINUM: Status  post coronary artery bypass grafting. Global cardiac size within normal limits. No pericardial effusion. The central airways are clear. Central pulmonary arteries are of normal caliber. Mild atherosclerotic calcification within the thoracic aorta. No aortic aneurysm. LYMPH NODES: No mediastinal, hilar or axillary lymphadenopathy. 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 This measures 3.3 x 7.7 cm. There is associated consolidation within the adjacent right lower lobe compatible with rounded atelectasis with associated right-sided volume loss. Left lung is clear. No pleural effusion on the left. No pneumothorax. SOFT TISSUES/BONES: No acute abnormality of the bones or soft tissues. UPPER ABDOMEN: Limited images of the upper abdomen demonstrates mild splenomegaly. IMPRESSION: 1. Complex loculated right pleural effusion with pleural thickening and circumferential calcification, unchanged and consistent with fibrothorax, with associated rounded atelectasis in the right lower lobe and right-sided volume loss. 2. Mild splenomegaly. Electronically signed by: Dorethia Molt MD 07/30/2024 01:28 AM EST RP Workstation: [id number]    Recent Labs: Lab Results  Component Value Date   WBC 14.0 (H) 12/26/2023   HGB 14.8 12/26/2023   PLT 146 (L) 12/26/2023   NA 138 02/26/2024   K 4.1 02/26/2024   CL 102 02/26/2024   CO2 25 02/26/2024   GLUCOSE 100 (H) 02/26/2024   BUN 19 02/26/2024   CREATININE 1.34 02/26/2024   BILITOT 0.6 02/26/2024   ALKPHOS 60 02/26/2024   AST 18 02/26/2024   ALT 13 02/26/2024   PROT 7.5 02/26/2024   ALBUMIN 4.9 02/26/2024   CALCIUM  9.4 02/26/2024   GFRAA 65 12/08/2020    Speciality Comments: No specialty comments available.  Procedures:  No procedures performed Allergies: Patient has no known allergies.    Assessment / Plan:     Visit Diagnoses: Idiopathic chronic gout of multiple sites without tophus -patient switched dosing of allopurinol  to 300 mg daily after the last visit.  He has not had uric acid checked since December 2024.  He denies having a gout flare.  He has been taking allopurinol  on a regular basis.  He has done dietary modifications and also intentional weight loss.  He still drinks an ounce of alcohol daily.  Association of high BMI with gout was discussed.  Uric acid was 7.5 on July 30, 2023. -I will check uric acid today.  Plan: Uric acid.  Prescription refill for allopurinol  for 90-day supply with 1 refill was sent.  Medication monitoring encounter -CBC and CMP were normal in July 2025 except GFR remains low.  Plan: CBC with Differential/Platelet, Comprehensive metabolic panel with GFR  Chronic pain of left knee-he states his knee joint is doing better.  Primary osteoarthritis of both hands-he had bilateral PIP and DIP thickening.  No synovitis was noted.  Joint protection muscle strengthening was discussed and a handout was given.  Primary osteoarthritis of both feet-currently not symptomatic.  Pain in right hip-he had good range of motion without discomfort today.  Degeneration of intervertebral disc of lumbar region without discogenic back pain or lower extremity pain-he continues to have some discomfort in his back especially after workout.  He had limited range of motion of the lumbar spine.  A handout on back exercises were given.  X-rays obtained in the past showed multilevel spondylosis, facet joint arthropathy and levoscoliosis.  I offered physical therapy but he would like to wait.  A handout on back exercises was given.  Neuropathy  CKD (chronic kidney disease) stage 2, GFR 60-89 ml/min-GFR was in 50s in July.  History of atrial fibrillation  Atherosclerosis of coronary artery bypass graft of native heart without angina pectoris  History of  hyperlipidemia-patient is on Repatha  now.  He discontinued his statins.  Other insomnia  Chronic diastolic CHF (congestive heart failure) (HCC)  Orders: Orders Placed This Encounter  Procedures   CBC with Differential/Platelet   Comprehensive metabolic panel with GFR   Uric acid   Meds ordered this encounter  Medications   allopurinol  (ZYLOPRIM ) 300 MG tablet    Sig: Take 1 tablet (300 mg total) by mouth daily.    Dispense:  90 tablet    Refill:  1    Follow-Up Instructions: Return in about 6 months (around 02/21/2025) for Gout.   Maya Nash, MD  Note - This record has been created using Animal nutritionist.  Chart creation errors have been sought, but may not always  have been located. Such creation errors do not reflect on  the standard of medical care.     [1]  Social History Tobacco Use   Smoking status: Former    Current packs/day: 0.00    Types: Cigarettes    Quit date: 08/14/1999    Years since quitting: 25.0    Passive exposure: Past   Smokeless tobacco: Never   Tobacco comments:    Social smoker  Vaping Use   Vaping status: Never Used  Substance Use Topics   Alcohol use: Yes    Alcohol/week: 7.0 standard drinks of alcohol    Types: 7 Shots of liquor per week    Comment: 1oz daily   Drug use: No   "

## 2024-08-24 ENCOUNTER — Encounter: Payer: Self-pay | Admitting: Rheumatology

## 2024-08-24 ENCOUNTER — Ambulatory Visit: Attending: Rheumatology | Admitting: Rheumatology

## 2024-08-24 VITALS — BP 129/73 | HR 66 | Temp 96.0°F | Resp 15 | Ht 67.0 in | Wt 178.0 lb

## 2024-08-24 DIAGNOSIS — M19071 Primary osteoarthritis, right ankle and foot: Secondary | ICD-10-CM | POA: Diagnosis not present

## 2024-08-24 DIAGNOSIS — I25810 Atherosclerosis of coronary artery bypass graft(s) without angina pectoris: Secondary | ICD-10-CM

## 2024-08-24 DIAGNOSIS — I5032 Chronic diastolic (congestive) heart failure: Secondary | ICD-10-CM

## 2024-08-24 DIAGNOSIS — Z5181 Encounter for therapeutic drug level monitoring: Secondary | ICD-10-CM | POA: Diagnosis not present

## 2024-08-24 DIAGNOSIS — N182 Chronic kidney disease, stage 2 (mild): Secondary | ICD-10-CM | POA: Diagnosis not present

## 2024-08-24 DIAGNOSIS — M19041 Primary osteoarthritis, right hand: Secondary | ICD-10-CM

## 2024-08-24 DIAGNOSIS — M51369 Other intervertebral disc degeneration, lumbar region without mention of lumbar back pain or lower extremity pain: Secondary | ICD-10-CM

## 2024-08-24 DIAGNOSIS — Z8639 Personal history of other endocrine, nutritional and metabolic disease: Secondary | ICD-10-CM | POA: Diagnosis not present

## 2024-08-24 DIAGNOSIS — Z8679 Personal history of other diseases of the circulatory system: Secondary | ICD-10-CM | POA: Diagnosis not present

## 2024-08-24 DIAGNOSIS — G629 Polyneuropathy, unspecified: Secondary | ICD-10-CM

## 2024-08-24 DIAGNOSIS — M25551 Pain in right hip: Secondary | ICD-10-CM

## 2024-08-24 DIAGNOSIS — G4709 Other insomnia: Secondary | ICD-10-CM | POA: Diagnosis not present

## 2024-08-24 DIAGNOSIS — G8929 Other chronic pain: Secondary | ICD-10-CM

## 2024-08-24 DIAGNOSIS — M19072 Primary osteoarthritis, left ankle and foot: Secondary | ICD-10-CM

## 2024-08-24 DIAGNOSIS — M1A09X0 Idiopathic chronic gout, multiple sites, without tophus (tophi): Secondary | ICD-10-CM | POA: Diagnosis not present

## 2024-08-24 DIAGNOSIS — M19042 Primary osteoarthritis, left hand: Secondary | ICD-10-CM

## 2024-08-24 DIAGNOSIS — M25562 Pain in left knee: Secondary | ICD-10-CM | POA: Diagnosis not present

## 2024-08-24 MED ORDER — ALLOPURINOL 300 MG PO TABS: 300.0000 mg | ORAL_TABLET | Freq: Every day | ORAL | 1 refills | Status: AC

## 2024-08-24 NOTE — Patient Instructions (Addendum)
Low Back Sprain or Strain Rehab Ask your health care provider which exercises are safe for you. Do exercises exactly as told by your health care provider and adjust them as directed. It is normal to feel mild stretching, pulling, tightness, or discomfort as you do these exercises. Stop right away if you feel sudden pain or your pain gets worse. Do not begin these exercises until told by your health care provider. Stretching and range-of-motion exercises These exercises warm up your muscles and joints and improve the movement and flexibility of your back. These exercises also help to relieve pain, numbness, and tingling. Lumbar rotation  Lie on your back on a firm bed or the floor with your knees bent. Straighten your arms out to your sides so each arm forms a 90-degree angle (right angle) with a side of your body. Slowly move (rotate) both of your knees to one side of your body until you feel a stretch in your lower back (lumbar). Try not to let your shoulders lift off the floor. Hold this position for __________ seconds. Tense your abdominal muscles and slowly move your knees back to the starting position. Repeat this exercise on the other side of your body. Repeat __________ times. Complete this exercise __________ times a day. Single knee to chest  Lie on your back on a firm bed or the floor with both legs straight. Bend one of your knees. Use your hands to move your knee up toward your chest until you feel a gentle stretch in your lower back and buttock. Hold your leg in this position by holding on to the front of your knee. Keep your other leg as straight as possible. Hold this position for __________ seconds. Slowly return to the starting position. Repeat with your other leg. Repeat __________ times. Complete this exercise __________ times a day. Prone extension on elbows  Lie on your abdomen on a firm bed or the floor (prone position). Prop yourself up on your elbows. Use your arms  to help lift your chest up until you feel a gentle stretch in your abdomen and your lower back. This will place some of your body weight on your elbows. If this is uncomfortable, try stacking pillows under your chest. Your hips should stay down, against the surface that you are lying on. Keep your hip and back muscles relaxed. Hold this position for __________ seconds. Slowly relax your upper body and return to the starting position. Repeat __________ times. Complete this exercise __________ times a day. Strengthening exercises These exercises build strength and endurance in your back. Endurance is the ability to use your muscles for a long time, even after they get tired. Pelvic tilt This exercise strengthens the muscles that lie deep in the abdomen. Lie on your back on a firm bed or the floor with your legs extended. Bend your knees so they are pointing toward the ceiling and your feet are flat on the floor. Tighten your lower abdominal muscles to press your lower back against the floor. This motion will tilt your pelvis so your tailbone points up toward the ceiling instead of pointing to your feet or the floor. To help with this exercise, you may place a small towel under your lower back and try to push your back into the towel. Hold this position for __________ seconds. Let your muscles relax completely before you repeat this exercise. Repeat __________ times. Complete this exercise __________ times a day. Alternating arm and leg raises  Get on your hands  and knees on a firm surface. If you are on a hard floor, you may want to use padding, such as an exercise mat, to cushion your knees. Line up your arms and legs. Your hands should be directly below your shoulders, and your knees should be directly below your hips. Lift your left leg behind you. At the same time, raise your right arm and straighten it in front of you. Do not lift your leg higher than your hip. Do not lift your arm higher  than your shoulder. Keep your abdominal and back muscles tight. Keep your hips facing the ground. Do not arch your back. Keep your balance carefully, and do not hold your breath. Hold this position for __________ seconds. Slowly return to the starting position. Repeat with your right leg and your left arm. Repeat __________ times. Complete this exercise __________ times a day. Abdominal set with straight leg raise  Lie on your back on a firm bed or the floor. Bend one of your knees and keep your other leg straight. Tense your abdominal muscles and lift your straight leg up, 4-6 inches (10-15 cm) off the ground. Keep your abdominal muscles tight and hold this position for __________ seconds. Do not hold your breath. Do not arch your back. Keep it flat against the ground. Keep your abdominal muscles tense as you slowly lower your leg back to the starting position. Repeat with your other leg. Repeat __________ times. Complete this exercise __________ times a day. Single leg lower with bent knees Lie on your back on a firm bed or the floor. Tense your abdominal muscles and lift your feet off the floor, one foot at a time, so your knees and hips are bent in 90-degree angles (right angles). Your knees should be over your hips and your lower legs should be parallel to the floor. Keeping your abdominal muscles tense and your knee bent, slowly lower one of your legs so your toe touches the ground. Lift your leg back up to return to the starting position. Do not hold your breath. Do not let your back arch. Keep your back flat against the ground. Repeat with your other leg. Repeat __________ times. Complete this exercise __________ times a day. Posture and body mechanics Good posture and healthy body mechanics can help to relieve stress in your body's tissues and joints. Body mechanics refers to the movements and positions of your body while you do your daily activities. Posture is part of body  mechanics. Good posture means: Your spine is in its natural S-curve position (neutral). Your shoulders are pulled back slightly. Your head is not tipped forward (neutral). Follow these guidelines to improve your posture and body mechanics in your everyday activities. Standing  When standing, keep your spine neutral and your feet about hip-width apart. Keep a slight bend in your knees. Your ears, shoulders, and hips should line up. When you do a task in which you stand in one place for a long time, place one foot up on a stable object that is 2-4 inches (5-10 cm) high, such as a footstool. This helps keep your spine neutral. Sitting  When sitting, keep your spine neutral and keep your feet flat on the floor. Use a footrest, if necessary, and keep your thighs parallel to the floor. Avoid rounding your shoulders, and avoid tilting your head forward. When working at a desk or a computer, keep your desk at a height where your hands are slightly lower than your elbows. Slide your  chair under your desk so you are close enough to maintain good posture. When working at a computer, place your monitor at a height where you are looking straight ahead and you do not have to tilt your head forward or downward to look at the screen. Resting When lying down and resting, avoid positions that are most painful for you. If you have pain with activities such as sitting, bending, stooping, or squatting, lie in a position in which your body does not bend very much. For example, avoid curling up on your side with your arms and knees near your chest (fetal position). If you have pain with activities such as standing for a long time or reaching with your arms, lie with your spine in a neutral position and bend your knees slightly. Try the following positions: Lying on your side with a pillow between your knees. Lying on your back with a pillow under your knees. Lifting  When lifting objects, keep your feet at least  shoulder-width apart and tighten your abdominal muscles. Bend your knees and hips and keep your spine neutral. It is important to lift using the strength of your legs, not your back. Do not lock your knees straight out. Always ask for help to lift heavy or awkward objects. This information is not intended to replace advice given to you by your health care provider. Make sure you discuss any questions you have with your health care provider. Document Revised: 12/03/2022 Document Reviewed: 10/17/2020 [employer] Patient Education  2024 [employer]  Hand Exercises Hand exercises can be helpful for almost anyone. They can strengthen your hands and improve flexibility and movement. The exercises can also increase blood flow to the hands. These results can make your work and daily tasks easier for you. Hand exercises can be especially helpful for people who have joint pain from arthritis or nerve damage from using their hands over and over. These exercises can also help people who injure a hand. Exercises Most of these hand exercises are gentle stretching and motion exercises. It is usually safe to do them often throughout the day. Warming up your hands before exercise may help reduce stiffness. You can do this with gentle massage or by placing your hands in warm water for 10-15 minutes. It is normal to feel some stretching, pulling, tightness, or mild discomfort when you begin new exercises. In time, this will improve. Remember to always be careful and stop right away if you feel sudden, very bad pain or your pain gets worse. You want to get better and be safe. Ask your health care provider which exercises are safe for you. Do exercises exactly as told by your provider and adjust them as told. Do not begin these exercises until told by your provider. Knuckle bend or "claw" fist  Stand or sit with your arm, hand, and all five fingers pointed straight up. Make sure to keep your wrist straight. Gently bend  your fingers down toward your palm until the tips of your fingers are touching your palm. Keep your big knuckle straight and only bend the small knuckles in your fingers. Hold this position for 10 seconds. Straighten your fingers back to your starting position. Repeat this exercise 5-10 times with each hand. Full finger fist  Stand or sit with your arm, hand, and all five fingers pointed straight up. Make sure to keep your wrist straight. Gently bend your fingers into your palm until the tips of your fingers are touching the middle of your  palm. Hold this position for 10 seconds. Extend your fingers back to your starting position, stretching every joint fully. Repeat this exercise 5-10 times with each hand. Straight fist  Stand or sit with your arm, hand, and all five fingers pointed straight up. Make sure to keep your wrist straight. Gently bend your fingers at the big knuckle, where your fingers meet your hand, and at the middle knuckle. Keep the knuckle at the tips of your fingers straight and try to touch the bottom of your palm. Hold this position for 10 seconds. Extend your fingers back to your starting position, stretching every joint fully. Repeat this exercise 5-10 times with each hand. Tabletop  Stand or sit with your arm, hand, and all five fingers pointed straight up. Make sure to keep your wrist straight. Gently bend your fingers at the big knuckle, where your fingers meet your hand, as far down as you can. Keep the small knuckles in your fingers straight. Think of forming a tabletop with your fingers. Hold this position for 10 seconds. Extend your fingers back to your starting position, stretching every joint fully. Repeat this exercise 5-10 times with each hand. Finger spread  Place your hand flat on a table with your palm facing down. Make sure your wrist stays straight. Spread your fingers and thumb apart from each other as far as you can until you feel a gentle stretch.  Hold this position for 10 seconds. Bring your fingers and thumb tight together again. Hold this position for 10 seconds. Repeat this exercise 5-10 times with each hand. Making circles  Stand or sit with your arm, hand, and all five fingers pointed straight up. Make sure to keep your wrist straight. Make a circle by touching the tip of your thumb to the tip of your index finger. Hold for 10 seconds. Then open your hand wide. Repeat this motion with your thumb and each of your fingers. Repeat this exercise 5-10 times with each hand. Thumb motion  Sit with your forearm resting on a table and your wrist straight. Your thumb should be facing up toward the ceiling. Keep your fingers relaxed as you move your thumb. Lift your thumb up as high as you can toward the ceiling. Hold for 10 seconds. Bend your thumb across your palm as far as you can, reaching the tip of your thumb for the small finger (pinkie) side of your palm. Hold for 10 seconds. Repeat this exercise 5-10 times with each hand. Grip strengthening  Hold a stress ball or other soft ball in the middle of your hand. Slowly increase the pressure, squeezing the ball as much as you can without causing pain. Think of bringing the tips of your fingers into the middle of your palm. All of your finger joints should bend when doing this exercise. Hold your squeeze for 10 seconds, then relax. Repeat this exercise 5-10 times with each hand. Contact a health care provider if: Your hand pain or discomfort gets much worse when you do an exercise. Your hand pain or discomfort does not improve within 2 hours after you exercise. If you have either of these problems, stop doing these exercises right away. Do not do them again unless your provider says that you can. Get help right away if: You develop sudden, severe hand pain or swelling. If this happens, stop doing these exercises right away. Do not do them again unless your provider says that you  can. This information is not intended to replace advice  given to you by your health care provider. Make sure you discuss any questions you have with your health care provider. Document Revised: 08/14/2022 Document Reviewed: 08/14/2022 [employer] Patient Education  2024 [employer].

## 2024-08-25 ENCOUNTER — Ambulatory Visit: Payer: Self-pay | Admitting: Rheumatology

## 2024-08-25 ENCOUNTER — Other Ambulatory Visit: Payer: Self-pay | Admitting: Medical Genetics

## 2024-08-25 LAB — CBC WITH DIFFERENTIAL/PLATELET
Absolute Lymphocytes: 1824 {cells}/uL (ref 850–3900)
Absolute Monocytes: 1360 {cells}/uL — ABNORMAL HIGH (ref 200–950)
Basophils Absolute: 40 {cells}/uL (ref 0–200)
Basophils Relative: 0.5 %
Eosinophils Absolute: 128 {cells}/uL (ref 15–500)
Eosinophils Relative: 1.6 %
HCT: 47.9 % (ref 39.4–51.1)
Hemoglobin: 15.5 g/dL (ref 13.2–17.1)
MCH: 30.9 pg (ref 27.0–33.0)
MCHC: 32.4 g/dL (ref 31.6–35.4)
MCV: 95.4 fL (ref 81.4–101.7)
MPV: 10.8 fL (ref 7.5–12.5)
Monocytes Relative: 17 %
Neutro Abs: 4648 {cells}/uL (ref 1500–7800)
Neutrophils Relative %: 58.1 %
Platelets: 235 Thousand/uL (ref 140–400)
RBC: 5.02 Million/uL (ref 4.20–5.80)
RDW: 12.6 % (ref 11.0–15.0)
Total Lymphocyte: 22.8 %
WBC: 8 Thousand/uL (ref 3.8–10.8)

## 2024-08-25 LAB — COMPREHENSIVE METABOLIC PANEL WITH GFR
AG Ratio: 1.8 (calc) (ref 1.0–2.5)
ALT: 12 U/L (ref 9–46)
AST: 17 U/L (ref 10–35)
Albumin: 4.3 g/dL (ref 3.6–5.1)
Alkaline phosphatase (APISO): 64 U/L (ref 35–144)
BUN/Creatinine Ratio: 14 (calc) (ref 6–22)
BUN: 20 mg/dL (ref 7–25)
CO2: 30 mmol/L (ref 20–32)
Calcium: 9.3 mg/dL (ref 8.6–10.3)
Chloride: 102 mmol/L (ref 98–110)
Creat: 1.48 mg/dL — ABNORMAL HIGH (ref 0.70–1.35)
Globulin: 2.4 g/dL (ref 1.9–3.7)
Glucose, Bld: 86 mg/dL (ref 65–99)
Potassium: 4.9 mmol/L (ref 3.5–5.3)
Sodium: 137 mmol/L (ref 135–146)
Total Bilirubin: 0.6 mg/dL (ref 0.2–1.2)
Total Protein: 6.7 g/dL (ref 6.1–8.1)
eGFR: 52 mL/min/1.73m2 — ABNORMAL LOW

## 2024-08-25 LAB — URIC ACID: Uric Acid, Serum: 9 mg/dL — ABNORMAL HIGH (ref 4.0–8.0)

## 2024-08-25 NOTE — Progress Notes (Signed)
 CBC normal, creatinine is elevated at 1.48 and GFR low.  Uric acid is high at 9.0.  Patient should take allopurinol  on a regular basis.

## 2024-08-28 ENCOUNTER — Encounter: Payer: Self-pay | Admitting: Family Medicine

## 2024-08-28 ENCOUNTER — Other Ambulatory Visit: Payer: Self-pay | Admitting: Internal Medicine

## 2024-08-28 ENCOUNTER — Ambulatory Visit: Admitting: Family Medicine

## 2024-08-28 ENCOUNTER — Other Ambulatory Visit (HOSPITAL_COMMUNITY)
Admission: RE | Admit: 2024-08-28 | Discharge: 2024-08-28 | Disposition: A | Source: Ambulatory Visit | Attending: Family Medicine | Admitting: Family Medicine

## 2024-08-28 VITALS — BP 120/70 | HR 61 | Temp 97.9°F | Resp 16 | Ht 67.0 in | Wt 181.0 lb

## 2024-08-28 DIAGNOSIS — Z79899 Other long term (current) drug therapy: Secondary | ICD-10-CM

## 2024-08-28 DIAGNOSIS — I25810 Atherosclerosis of coronary artery bypass graft(s) without angina pectoris: Secondary | ICD-10-CM | POA: Diagnosis not present

## 2024-08-28 DIAGNOSIS — Z85828 Personal history of other malignant neoplasm of skin: Secondary | ICD-10-CM

## 2024-08-28 DIAGNOSIS — N1831 Chronic kidney disease, stage 3a: Secondary | ICD-10-CM

## 2024-08-28 DIAGNOSIS — R12 Heartburn: Secondary | ICD-10-CM

## 2024-08-28 DIAGNOSIS — E291 Testicular hypofunction: Secondary | ICD-10-CM

## 2024-08-28 DIAGNOSIS — L989 Disorder of the skin and subcutaneous tissue, unspecified: Secondary | ICD-10-CM

## 2024-08-28 DIAGNOSIS — Z113 Encounter for screening for infections with a predominantly sexual mode of transmission: Secondary | ICD-10-CM | POA: Diagnosis present

## 2024-08-28 DIAGNOSIS — Z7184 Encounter for health counseling related to travel: Secondary | ICD-10-CM

## 2024-08-28 LAB — TESTOSTERONE: Testosterone: 314.94 ng/dL (ref 300.00–890.00)

## 2024-08-28 LAB — MAGNESIUM: Magnesium: 2.3 mg/dL (ref 1.5–2.5)

## 2024-08-28 LAB — VITAMIN B12: Vitamin B-12: 1216 pg/mL — ABNORMAL HIGH (ref 211–911)

## 2024-08-28 LAB — PSA: PSA: 1.78 ng/mL (ref 0.10–4.00)

## 2024-08-28 MED ORDER — VALACYCLOVIR HCL 500 MG PO TABS: 500.0000 mg | ORAL_TABLET | Freq: Two times a day (BID) | ORAL | 3 refills | Status: AC

## 2024-08-28 MED ORDER — AZITHROMYCIN 500 MG PO TABS: 1000.0000 mg | ORAL_TABLET | Freq: Once | ORAL | 0 refills | Status: AC

## 2024-08-28 MED ORDER — FUROSEMIDE 20 MG PO TABS: 20.0000 mg | ORAL_TABLET | ORAL | 3 refills | Status: AC

## 2024-08-28 NOTE — Patient Instructions (Addendum)
 No medication changes at this time.  I did write for azithromycin  if needed for significant traveler's diarrhea symptoms, but please check the website for the CDC for other recommendations for travel and schedule separate visit if needed for additional vaccines or other information.  I will check other screening labs, and let you know if there are concerns.  I have referred you to dermatology to evaluate the area on your neck.  Let me know if there are questions and take care.  [email or web address]

## 2024-08-28 NOTE — Progress Notes (Signed)
 "  Subjective:  Patient ID: Jeremy Hendricks, male    DOB: 05-28-1956  Age: 69 y.o. MRN: [id number]  CC:  Chief Complaint  Patient presents with   Follow-up    6 month med check and labs. Patient would like for you to look at a place on the back of his neck and jaw    HPI Jeremy Hendricks presents for   Follow-up of chronic conditions and acute concern as above.  Neck lesion, concern Posterior neck - roughened skin, slight soreness. Past month. No blisters, no discharge. No changes. Derm - Dr. Lynnell, no recent eval.  Hx of actinic keratosis, superficial basal cell CA.   HIV preexposure prophylaxis Treated with Truvada  without any side effects.  Has not had symptoms of STIs.no new partners.  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 Lab Results  Component Value Date   CREATININE 1.48 (H) 08/24/2024  Last gfr 52 few days  ago. Nephrology - [facility] - Dr. Dolan. Visit 05/2024 - CKD 3a. Range 1.2-1.5. no nsaids. Yearly follow up.  Plans to increase water intake   Hyperlipidemia: Crestor  40 mg daily prior, prior myalgias, now on repatha  - tolerated better.  history of CAD, CABG, followed by cardiology. Lab Results  Component Value Date   CHOL 80 (L) 07/20/2024   HDL 34 (L) 07/20/2024   LDLCALC 30 07/20/2024   TRIG 76 07/20/2024   CHOLHDL 2.4 07/20/2024   Lab Results  Component Value Date   ALT 12 08/24/2024   AST 17 08/24/2024   ALKPHOS 60 02/26/2024   BILITOT 0.6 08/24/2024   GERD Protonix  20 mg daily without recurrence of reflux on that regimen. Flare off meds.   Hypogonadism Treated with testosterone  supplementation 30 mg solution 1 pump per day without any side effects, exercise without difficulty. No skin irritation.   Lab Results  Component Value Date   TESTOSTERONE  627.93 02/26/2024   Lab Results  Component Value Date   PSA1 1.5 05/23/2020   PSA1 1.4 03/12/2019   PSA1 1.8 04/15/2018   PSA 1.79 02/26/2024   PSA 1.54 08/26/2023   PSA 1.75 07/09/2022    At end  of visit - plans on travel to [place] next month.  Some adventurous eating. Requests med for traveler's diarrhea if needed. Discussed CDC travel website.      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 s/p CABG 2002. b. s/p redo 2012.   DJD (degenerative joint disease)    GERD (gastroesophageal reflux disease)  Gout    History of ETT    a. ETT 6/16:  normal   Past Surgical History:  Procedure Laterality Date   CORONARY ARTERY BYPASS GRAFT  2002   CORONARY ARTERY BYPASS GRAFT  08-2010   L-LAD remained from original CABG; new grafts incl L radial- PDA + RIMA-RI   LEFT HEART CATH AND CORS/GRAFTS ANGIOGRAPHY N/A 12/31/2023   Procedure: LEFT HEART CATH AND CORS/GRAFTS ANGIOGRAPHY;  Surgeon: Verlin Lonni BIRCH, MD;  Location: [facility];  Service: Cardiovascular;  Laterality: N/A;   VASECTOMY     Allergies[1] Prior to Admission medications  Medication Sig Start Date End Date Taking? Authorizing Provider  albuterol  (VENTOLIN  HFA) 108 (90 Base) MCG/ACT inhaler Inhale 1-2 puffs into the  lungs every 6 (six) hours as needed. 06/17/23  Yes Vivienne Delon HERO, PA-C  allopurinol  (ZYLOPRIM ) 300 MG tablet Take 1 tablet (300 mg total) by mouth daily. 08/24/24  Yes Deveshwar, Maya, MD  aspirin  EC 81 MG tablet Take 81 mg by mouth daily. Swallow whole.   Yes [provider]  clotrimazole  (LOTRIMIN ) 1 % cream Apply 1 Application topically 2 (two) times daily. 07/16/24  Yes Levora Reyes SAUNDERS, MD  diazepam (VALIUM) 5 MG tablet Take 5 mg by mouth every 12 (twelve) hours as needed for anxiety. 06/05/23  Yes [provider]  emtricitabine -tenofovir  (TRUVADA ) 200-300 MG tablet TAKE 1 TABLET BY MOUTH EVERY DAY 08/14/24  Yes Levora Reyes SAUNDERS, MD  Evolocumab  (REPATHA  SURECLICK) 140 MG/ML SOAJ Inject 140 mg into the skin every 14 (fourteen) days. 06/16/24  Yes Verlin Lonni BIRCH, MD  furosemide  (LASIX ) 20 MG tablet Take 1 tablet (20 mg total) by mouth every other day. 08/26/23  Yes Levora Reyes SAUNDERS, MD  hydrocortisone  (ANUSOL -HC) 2.5 % rectal cream Place 1 Application rectally 2 (two) times daily. 07/20/24  Yes Vonna Sharlet POUR, MD  isosorbide  mononitrate (IMDUR ) 30 MG 24 hr tablet Take 1 tablet (30 mg total) by mouth daily. 10/04/23  Yes Thukkani, Arun K, MD  metoprolol  succinate (TOPROL  XL) 25 MG 24 hr tablet Take 1 tablet (25 mg total) by mouth at bedtime. 10/04/23  Yes Thukkani, Arun K, MD  MNEXSPIKE 10 MCG/0.2ML SUSY as needed. 06/03/24  Yes [provider]  Multiple Vitamin (MULTIVITAMIN) tablet Take 1 tablet by mouth daily.   Yes [provider]  nitroGLYCERIN  (NITROSTAT ) 0.4 MG SL tablet Place 1 tablet (0.4 mg total) under the tongue every 5 (five) minutes as needed for chest pain. 10/04/23  Yes Thukkani, Arun K, MD  pantoprazole  (PROTONIX ) 20 MG tablet TAKE 1 TABLET BY MOUTH EVERY DAY 03/19/24  Yes Thukkani, Arun K, MD  Testosterone  30 MG/ACT SOLN APPLY 1 PUMP EVERY DAY 08/14/24  Yes Levora Reyes SAUNDERS, MD  traMADol  (ULTRAM ) 50 MG tablet Take 1 tablet (50 mg  total) by mouth every 6 (six) hours as needed (pain). 07/20/24  Yes Vonna Sharlet POUR, MD  valACYclovir  (VALTREX ) 500 MG tablet Take 1 tablet (500 mg total) by mouth 2 (two) times daily. 08/26/23  Yes Levora Reyes SAUNDERS, MD   Social History   Socioeconomic History   Marital status: Single    Spouse name: Not on file   Number of children: 2   Years of education: Not on file   Highest education level: Master's degree (e.g., MA, MS, MEng, MEd, MSW, MBA)  Occupational History   Occupation: Realtor  Tobacco Use   Smoking status: Former    Current packs/day: 0.00    Types: Cigarettes  Quit date: 08/14/1999    Years since quitting: 25.0    Passive exposure: Past   Smokeless tobacco: Never   Tobacco comments:    Social smoker  Vaping Use   Vaping status: Never Used  Substance and Sexual Activity   Alcohol use: Yes    Alcohol/week: 7.0 standard drinks of alcohol    Types: 7 Shots of liquor per week    Comment: 1oz daily   Drug use: No   Sexual activity: Yes  Other Topics Concern   Not on file  Social History Narrative   Single   Education: College   Exercise: Yes   Social Drivers of Health   Tobacco Use: Medium Risk (08/28/2024)   Patient History    Smoking Tobacco Use: Former    Smokeless Tobacco Use: Never    Passive Exposure: Past  Physicist, Medical Strain: Low Risk (08/24/2024)   Overall Financial Resource Strain (CARDIA)    Difficulty of Paying Living Expenses: Not very hard  Food Insecurity: No Food Insecurity (08/24/2024)   Epic    Worried About Programme Researcher, Broadcasting/film/video in the Last Year: Never true    Ran Out of Food in the Last Year: Never true  Transportation Needs: No Transportation Needs (08/24/2024)   Epic    Lack of Transportation (Medical): No    Lack of Transportation (Non-Medical): No  Physical Activity: Sufficiently Active (08/24/2024)   Exercise Vital Sign    Days of Exercise per Week: 4 days    Minutes of Exercise per Session: 50 min  Stress: No Stress  Concern Present (08/24/2024)   [employer] of Occupational Health - Occupational Stress Questionnaire    Feeling of Stress: Only a little  Social Connections: Socially Integrated (08/24/2024)   Social Connection and Isolation Panel    Frequency of Communication with Friends and Family: More than three times a week    Frequency of Social Gatherings with Friends and Family: More than three times a week    Attends Religious Services: More than 4 times per year    Active Member of [employer] or Organizations: Yes    Attends Banker Meetings: More than 4 times per year    Marital Status: Living with partner  Intimate Partner Violence: Not At Risk (11/22/2022)   Humiliation, Afraid, Rape, and Kick questionnaire    Fear of Current or Ex-Partner: No    Emotionally Abused: No    Physically Abused: No    Sexually Abused: No  Depression (PHQ2-9): Low Risk (02/26/2024)   Depression (PHQ2-9)    PHQ-2 Score: 4  Alcohol Screen: Low Risk (08/24/2024)   Alcohol Screen    Last Alcohol Screening Score (AUDIT): 4  Housing: Low Risk (08/24/2024)   Epic    Unable to Pay for Housing in the Last Year: No    Number of Times Moved in the Last Year: 0    Homeless in the Last Year: No  Utilities: Not At Risk (11/18/2022)   [facility] Utilities    Threatened with loss of utilities: No  Health Literacy: Not on file    Review of Systems Per hPI.   Objective:   Vitals:   08/28/24 0825  BP: 120/70  Pulse: 61  Resp: 16  Temp: 97.9 F (36.6 C)  TempSrc: Temporal  SpO2: 100%  Weight: 181 lb (82.1 kg)  Height: 5' 7 (1.702 m)     Physical Exam Vitals reviewed.  Constitutional:      Appearance: He is well-developed.  HENT:  Head: Normocephalic and atraumatic.     Right Ear: External ear normal.     Left Ear: External ear normal.  Eyes:     Conjunctiva/sclera: Conjunctivae normal.     Pupils: Pupils are equal, round, and reactive to light.  Neck:     Thyroid: No thyromegaly.   Cardiovascular:     Rate and Rhythm: Normal rate and regular rhythm.     Heart sounds: Normal heart sounds.  Pulmonary:     Effort: Pulmonary effort is normal. No respiratory distress.     Breath sounds: Normal breath sounds. No wheezing.  Abdominal:     General: There is no distension.     Palpations: Abdomen is soft.     Tenderness: There is no abdominal tenderness.  Musculoskeletal:        General: No tenderness. Normal range of motion.     Cervical back: Normal range of motion and neck supple.  Lymphadenopathy:     Cervical: No cervical adenopathy.  Skin:    General: Skin is warm and dry.     Comments: Scaly lesion with slight excoriation behind left ear on neck, see photo.  Neurological:     Mental Status: He is alert and oriented to person, place, and time.     Deep Tendon Reflexes: Reflexes are normal and symmetric.  Psychiatric:        Behavior: Behavior normal.         Assessment & Plan:  Jeremy Hendricks is a 69 y.o. male . Hypogonadism in male - Plan: Testosterone , PSA  Routine screening for STI (sexually transmitted infection) - Plan: RPR W/RFLX TO RPR TITER, TREPONEMAL AB, SCREEN AND DIAGNOSIS, HIV Antibody (routine testing w rflx), Urine cytology ancillary only  On pre-exposure prophylaxis for HIV - Plan: RPR W/RFLX TO RPR TITER, TREPONEMAL AB, SCREEN AND DIAGNOSIS, HIV Antibody (routine testing w rflx), Urine cytology ancillary only  Heartburn  CKD stage 3a, GFR 45-59 ml/min (HCC)  Atherosclerosis of coronary artery bypass graft of native heart without angina pectoris  Long-term current use of proton pump inhibitor therapy - Plan: B12, Magnesium  History of basal cell carcinoma - Plan: Ambulatory referral to Dermatology  Skin lesion of neck - Plan: Ambulatory referral to Dermatology  Counseling about travel - Plan: azithromycin  (ZITHROMAX ) 500 MG tablet   Meds ordered this encounter  Medications   azithromycin  (ZITHROMAX ) 500 MG tablet    Sig:  Take 2 tablets (1,000 mg total) by mouth once for 1 dose. For traveler's diarrhea if needed.    Dispense:  2 tablet    Refill:  0   furosemide  (LASIX ) 20 MG tablet    Sig: Take 1 tablet (20 mg total) by mouth every other day.    Dispense:  45 tablet    Refill:  3   valACYclovir  (VALTREX ) 500 MG tablet    Sig: Take 1 tablet (500 mg total) by mouth 2 (two) times daily. As needed.    Dispense:  14 tablet    Refill:  3   Patient Instructions  No medication changes at this time.  I did write for azithromycin  if needed for significant traveler's diarrhea symptoms, but please check the website for the CDC for other recommendations for travel and schedule separate visit if needed for additional vaccines or other information.  I will check other screening labs, and let you know if there are concerns.  I have referred you to dermatology to evaluate the area on your neck.  Let me  know if there are questions and take care.  [email or web address]    Signed,   Reyes Pines, MD [facility] 08/28/24 9:15 AM      [1] No Known Allergies  "

## 2024-08-29 LAB — SYPHILIS: RPR W/REFLEX TO RPR TITER AND TREPONEMAL ANTIBODIES, TRADITIONAL SCREENING AND DIAGNOSIS ALGORITHM: RPR Ser Ql: NONREACTIVE

## 2024-08-29 LAB — HIV ANTIBODY (ROUTINE TESTING W REFLEX)
HIV 1&2 Ab, 4th Generation: NONREACTIVE
HIV FINAL INTERPRETATION: NEGATIVE

## 2024-08-31 LAB — URINE CYTOLOGY ANCILLARY ONLY
Chlamydia: NEGATIVE
Comment: NEGATIVE
Comment: NEGATIVE
Comment: NORMAL
Neisseria Gonorrhea: NEGATIVE
Trichomonas: NEGATIVE

## 2024-09-02 ENCOUNTER — Other Ambulatory Visit: Payer: Self-pay | Admitting: Internal Medicine

## 2024-09-04 ENCOUNTER — Ambulatory Visit: Payer: Self-pay | Admitting: Family Medicine

## 2024-09-04 NOTE — Telephone Encounter (Signed)
 Patient need make an appointment for refills. Thank 1st attempt

## 2024-09-08 ENCOUNTER — Encounter: Payer: Self-pay | Admitting: Pulmonary Disease

## 2024-09-08 ENCOUNTER — Ambulatory Visit: Admitting: Pulmonary Disease

## 2024-09-08 VITALS — BP 128/70 | HR 65 | Temp 97.9°F | Ht 67.0 in | Wt 182.0 lb

## 2024-09-08 DIAGNOSIS — J90 Pleural effusion, not elsewhere classified: Secondary | ICD-10-CM | POA: Diagnosis not present

## 2024-09-08 DIAGNOSIS — Z951 Presence of aortocoronary bypass graft: Secondary | ICD-10-CM

## 2024-09-08 NOTE — Patient Instructions (Addendum)
" °  VISIT SUMMARY: Today, we discussed your chronic pleural effusion, which has remained stable since it was first identified in 2019. You have no new symptoms or health concerns since your last visit in December.  YOUR PLAN: CHRONIC PLEURAL EFFUSION: You have a chronic pleural effusion that likely developed after a pneumonia infection before 2019. It has become stable and shows no current symptoms. -No need for further intervention unless symptoms develop. -No follow-up CT scan unless symptoms change. -No regular follow-up visits unless symptoms develop.    Contains text generated by [employer].   "

## 2024-09-08 NOTE — Progress Notes (Signed)
 "              Jeremy Hendricks    [id number]    Sep 24, 1955  Primary Care Physician:Greene, Reyes SAUNDERS, MD  Referring Physician: Levora Reyes SAUNDERS, MD [phone] [place],  [place] [address]  Chief complaint: Follow-up for abnormal CT scan, chronic pleural effusion  HPI: 69 y.o. who  has a past medical history of Asthma, Atrial fibrillation (HCC), Chronic diastolic CHF (congestive heart failure) (HCC), CKD (chronic kidney disease), stage II, Coronary artery disease, DJD (degenerative joint disease), GERD (gastroesophageal reflux disease), Gout, and History of ETT.   Discussed the use of AI scribe software for clinical note transcription with the patient, who gave verbal consent to proceed.  The patient, with a history of two open heart surgeries, presents for follow-up after a recent bout of pneumonia. He initially thought he had severe seasonal allergies, which he believes progressed to bronchitis and then pneumonia. He was treated with antibiotics and prednisone . He reports that his lungs have never been the same since his last open heart surgery 15 years ago. He also mentions that he had fluid drawn from his lungs several weeks to a month after his first open heart surgery. He quit smoking 20 years ago and has no family history of lung disease. He has a hot tub at home, which he keeps clean. He is currently working as a veterinary surgeon. Despite his recent illness, he was able to participate in a 5K run on Thanksgiving, although he did not perform as well as he would have liked. He also mentions that he sometimes has to prop himself up in bed to prevent wheezing at night.   Interim history: Discussed the use of AI scribe software for clinical note transcription with the patient, who gave verbal consent to proceed.  History of Present Illness Jeremy Hendricks is a 69 year old male with a history of chronic pleural effusion who presents for follow-up.  Pleural effusion - Pleural effusion  identified in 2019 during evaluation for chest pain - Effusion likely developed following pneumonia - Described as chronic with calcification and scarring - Effusion has remained stable since diagnosis - No current respiratory symptoms  Cardiac surgical history - Multiple coronary artery bypass graft surgeries beginning at age 12  Current symptoms and interval history - No new health concerns since last visit in December - No current breathing issues   Relevant pulmonary history: Pets: No pets Occupation: Veterinary Surgeon Exposures: Mold, hot tub, Jacuzzi.  No feather pillows or comforters Smoking history: Quit smoking in 2004 Travel history: Originally from [place] .  No significant recent travel Relevant family history: No history of lung disease  Outpatient Encounter Medications as of 09/08/2024  Medication Sig   albuterol  (VENTOLIN  HFA) 108 (90 Base) MCG/ACT inhaler Inhale 1-2 puffs into the lungs every 6 (six) hours as needed.   allopurinol  (ZYLOPRIM ) 300 MG tablet Take 1 tablet (300 mg total) by mouth daily.   aspirin  EC 81 MG tablet Take 81 mg by mouth daily. Swallow whole.   clotrimazole  (LOTRIMIN ) 1 % cream Apply 1 Application topically 2 (two) times daily.   emtricitabine -tenofovir  (TRUVADA ) 200-300 MG tablet TAKE 1 TABLET BY MOUTH EVERY DAY   Evolocumab  (REPATHA  SURECLICK) 140 MG/ML SOAJ Inject 140 mg into the skin every 14 (fourteen) days.   furosemide  (LASIX ) 20 MG tablet Take 1 tablet (20 mg total) by mouth every other day.   hydrocortisone  (ANUSOL -HC) 2.5 % rectal cream Place 1 Application  rectally 2 (two) times daily.   isosorbide  mononitrate (IMDUR ) 30 MG 24 hr tablet TAKE 1 TABLET BY MOUTH EVERY DAY   metoprolol  succinate (TOPROL -XL) 25 MG 24 hr tablet TAKE 1 TABLET BY MOUTH EVERYDAY AT BEDTIME   MNEXSPIKE 10 MCG/0.2ML SUSY as needed.   Multiple Vitamin (MULTIVITAMIN) tablet Take 1 tablet by mouth daily.   nitroGLYCERIN  (NITROSTAT ) 0.4 MG SL tablet Place 1 tablet (0.4 mg  total) under the tongue every 5 (five) minutes as needed for chest pain.   pantoprazole  (PROTONIX ) 20 MG tablet TAKE 1 TABLET BY MOUTH EVERY DAY   Testosterone  30 MG/ACT SOLN APPLY 1 PUMP EVERY DAY   traMADol  (ULTRAM ) 50 MG tablet Take 1 tablet (50 mg total) by mouth every 6 (six) hours as needed (pain).   valACYclovir  (VALTREX ) 500 MG tablet Take 1 tablet (500 mg total) by mouth 2 (two) times daily. As needed.   diazepam (VALIUM) 5 MG tablet Take 5 mg by mouth every 12 (twelve) hours as needed for anxiety. (Patient not taking: Reported on 09/08/2024)   [DISCONTINUED] metoprolol  succinate (TOPROL  XL) 25 MG 24 hr tablet Take 1 tablet (25 mg total) by mouth at bedtime.   No facility-administered encounter medications on file as of 09/08/2024.    Vitals:   09/08/24 1016  BP: 128/70  Pulse: 65  Temp: 97.9 F (36.6 C)  Height: 5' 7 (1.702 m)  Weight: 182 lb (82.6 kg)  SpO2: 99%  TempSrc: Oral  BMI (Calculated): 28.5     Physical Exam GEN: No acute distress. CV: Regular rate and rhythm, no murmurs. LUNGS: Clear to auscultation bilaterally, normal respiratory effort. SKIN JOINTS: Warm and dry, no rash.    Data Reviewed: Imaging: CT chest 08/24/2017-chronic right pleural effusion with associated pleural thickening, scarring, rounded atelectasis. CT chest 06/25/2023-chronic right effusion with pleural calcifications, stable rounded atelectasis in the right lower lobe, mild patchy opacities in the right upper lobe, bronchial wall thickening CT chest 07/29/2024-chronic loculated right effusion with pleural calcification, fibrothorax with rounded atelectasis.  I reviewed the images personally.  PFTs:  Labs:  Assessment & Plan Chronic pleural effusion Since 2019, likely secondary to a past infection or pneumonia. 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 It has decreased in size since 2019 and is now scarred over. No current  symptoms or changes in condition. He is maintaining an active lifestyle and continues to run with no pulmonary impairments. No need for further intervention unless symptoms develop.   - No follow-up CT scan unless symptoms change - No regular follow-up visits unless symptoms develop   Recommendations: Follow-up as needed  Leshaun Biebel MD [facility] 09/08/2024, 10:22 AM  CC: Levora Reyes SAUNDERS, MD    "

## 2024-09-10 ENCOUNTER — Ambulatory Visit: Admitting: Rheumatology

## 2024-09-10 ENCOUNTER — Other Ambulatory Visit

## 2024-09-10 DIAGNOSIS — Z006 Encounter for examination for normal comparison and control in clinical research program: Secondary | ICD-10-CM

## 2025-02-26 ENCOUNTER — Encounter: Admitting: Family Medicine
